# Patient Record
Sex: Male | Born: 1945
Health system: Southern US, Community
[De-identification: ages and names within clinical notes are randomized; demographics above are authoritative.]

## PROBLEM LIST (undated history)

## (undated) DIAGNOSIS — K5750 Diverticulosis of both small and large intestine without perforation or abscess without bleeding: Secondary | ICD-10-CM

## (undated) DIAGNOSIS — M545 Low back pain, unspecified: Secondary | ICD-10-CM

## (undated) DIAGNOSIS — Z9289 Personal history of other medical treatment: Secondary | ICD-10-CM

## (undated) DIAGNOSIS — T415X1A Poisoning by therapeutic gases, accidental (unintentional), initial encounter: Secondary | ICD-10-CM

## (undated) DIAGNOSIS — K469 Unspecified abdominal hernia without obstruction or gangrene: Secondary | ICD-10-CM

## (undated) DIAGNOSIS — I2510 Atherosclerotic heart disease of native coronary artery without angina pectoris: Secondary | ICD-10-CM

## (undated) DIAGNOSIS — H33319 Horseshoe tear of retina without detachment, unspecified eye: Secondary | ICD-10-CM

## (undated) DIAGNOSIS — E785 Hyperlipidemia, unspecified: Secondary | ICD-10-CM

## (undated) DIAGNOSIS — T300 Burn of unspecified body region, unspecified degree: Secondary | ICD-10-CM

## (undated) DIAGNOSIS — K219 Gastro-esophageal reflux disease without esophagitis: Secondary | ICD-10-CM

## (undated) DIAGNOSIS — B029 Zoster without complications: Secondary | ICD-10-CM

## (undated) DIAGNOSIS — M722 Plantar fascial fibromatosis: Secondary | ICD-10-CM

## (undated) DIAGNOSIS — M75101 Unspecified rotator cuff tear or rupture of right shoulder, not specified as traumatic: Secondary | ICD-10-CM

## (undated) HISTORY — DX: Burn of unspecified body region, unspecified degree: T30.0

## (undated) HISTORY — DX: Horseshoe tear of retina without detachment, unspecified eye: H33.319

## (undated) HISTORY — PX: EYE SURGERY: SHX253

## (undated) HISTORY — DX: Diverticulosis of both small and large intestine without perforation or abscess without bleeding: K57.50

## (undated) HISTORY — PX: SHOULDER SURGERY: SHX246

## (undated) HISTORY — DX: Low back pain: M54.5

## (undated) HISTORY — DX: Poisoning by therapeutic gases, accidental (unintentional), initial encounter: T41.5X1A

## (undated) HISTORY — PX: REFRACTIVE SURGERY: SHX103

## (undated) HISTORY — PX: SHOULDER ARTHROSCOPY: SHX128

## (undated) HISTORY — DX: Unspecified abdominal hernia without obstruction or gangrene: K46.9

## (undated) HISTORY — DX: Hyperlipidemia, unspecified: E78.5

## (undated) HISTORY — DX: Unspecified rotator cuff tear or rupture of right shoulder, not specified as traumatic: M75.101

## (undated) HISTORY — DX: Plantar fascial fibromatosis: M72.2

## (undated) HISTORY — DX: Low back pain, unspecified: M54.50

## (undated) HISTORY — PX: TONSILLECTOMY: SUR1361

## (undated) HISTORY — DX: Atherosclerotic heart disease of native coronary artery without angina pectoris: I25.10

## (undated) HISTORY — DX: Personal history of other medical treatment: Z92.89

## (undated) HISTORY — DX: Zoster without complications: B02.9

## (undated) HISTORY — DX: Gastro-esophageal reflux disease without esophagitis: K21.9

---

## 1998-04-27 ENCOUNTER — Ambulatory Visit: Admission: RE | Admit: 1998-04-27 | Discharge: 1998-04-27 | Payer: Self-pay | Admitting: Internal Medicine

## 2007-08-27 ENCOUNTER — Ambulatory Visit (HOSPITAL_COMMUNITY): Admission: RE | Admit: 2007-08-27 | Discharge: 2007-08-27 | Payer: Self-pay | Admitting: Internal Medicine

## 2011-04-24 ENCOUNTER — Ambulatory Visit: Payer: Self-pay | Admitting: Cardiovascular Disease

## 2011-05-25 ENCOUNTER — Encounter: Payer: Self-pay | Admitting: Cardiology

## 2011-05-28 ENCOUNTER — Encounter: Payer: Self-pay | Admitting: *Deleted

## 2011-05-28 ENCOUNTER — Ambulatory Visit (INDEPENDENT_AMBULATORY_CARE_PROVIDER_SITE_OTHER): Payer: Commercial Managed Care - PPO | Admitting: Cardiology

## 2011-05-28 VITALS — BP 158/87 | HR 76 | Resp 12 | Ht 66.0 in | Wt 173.0 lb

## 2011-05-28 DIAGNOSIS — E7800 Pure hypercholesterolemia, unspecified: Secondary | ICD-10-CM

## 2011-05-28 DIAGNOSIS — I2510 Atherosclerotic heart disease of native coronary artery without angina pectoris: Secondary | ICD-10-CM | POA: Insufficient documentation

## 2011-05-28 DIAGNOSIS — E785 Hyperlipidemia, unspecified: Secondary | ICD-10-CM | POA: Insufficient documentation

## 2011-05-28 DIAGNOSIS — I10 Essential (primary) hypertension: Secondary | ICD-10-CM | POA: Insufficient documentation

## 2011-05-28 NOTE — Assessment & Plan Note (Signed)
BP high today, has been in normal range at home.  States BP is always high at doctor's office.  May be "white coat" component.  I will have him check his BP daily and bring readings with him when he returns for ETT.  He will also bring in his cuff and calibrate it with ours.  As above, I will likely start him on a low dose of ramipril.

## 2011-05-28 NOTE — Assessment & Plan Note (Signed)
I would aim for excellent LDL control with known coronary disease.  Last LDL was 88 in 1/12.  Goal LDL < 70.  Will get fasting lipids when he returns for ETT.

## 2011-05-28 NOTE — Patient Instructions (Signed)
Your physician has requested that you have an exercise tolerance test. For further information please visit [email or web address] Please also follow instruction sheet, as given.  With DR. Shirlee Latch  Your physician recommends that you return for lab work same day as treadmill for fasting lipid and liver.  Your physician has requested that you regularly monitor and record your blood pressure readings at home. Please use the same machine at the same time of day to check your readings and record them to bring to your follow-up visit.  Please bring your bp log and cuff with you to your treadmill appt.

## 2011-05-28 NOTE — Assessment & Plan Note (Signed)
Patient has definite coronary artery disease based on coronary calcification seen on CT chest at [facility].  He really has no cardiopulmonary symptoms that could be construed as anginal.  Typically, this presentation would warrant maximal medical management with ASA 81, statin, and probably addition of ACEI without further imaging evaluation.  He does have rather diffuse calcification in the left main and all three major vessels, however.  I will set him up for an ETT without further imaging.  If he has good exercise tolerance and no or only mild ST changes, medical treatment will be warranted.  The only indication for further evaluation would be high-risk changes on ECG (deep ST depression in multiple leads), BP falling with exercise, severe chest pain on treadmill, etc.   - Continue ASA 81 and statin. - Will discuss addition of ramipril when I see him back for treadmill stress.

## 2011-05-28 NOTE — Progress Notes (Signed)
PCP: Dr. Waynard Edwards  65 yo with history of hyperlipidemia was referred for evaluation of coronary calcification seen on chest CT.  Patient had a chest CT done in 6/12 to screen for lung cancer with history of prior smoking.  This was done at [facility].  The CT showed calcified plaque in the left main and in all three coronary vessels.  Patient quit smoking in 1997.    Reginald Davis is very active in general.  He works out 5-6 times a week on a treadmill for 25-30 minutes at a time.  He gets his heart rate up to the 130s with peak exercise.  He does not get chest pain or heaviness with exertion.  He will note some mild dyspnea if he runs up a flight of steps but no shortness of breath after climbing a couple of flights at normal pace.  No recent change in exercise tolerance.  No palpitations. He does get occasional pain in his left shoulder area that is not related to exertion.  He has had left rotator cuff problems and had recent rotator cuff surgery.  He has been trying to eat more healthily and exercise more the last two years.  He has lost over 20 lbs.   Blood pressure is elevated in the office.  There may be a "white coat" component to this.  He takes his BP at least weekly at home and it runs 110s to 120s for the most part systolic.   ECG: NSR, normal  Labs (1/12): K 4.5, creatinine 0.8, LDL 88, HDL 53  PMH: 1. Hyperlipidemia 2. H/o Varicella zoster 3. Degenerative disc disease with history of low back and c-spine area pain 4. GERD 5. Diverticulosis 6. Impaired fasting glucose 7. BPH 8. CAD: Coronary calcification noted on CT chest from 6/12 (to screen for lung cancer in former smoker).  9. Left rotator cuff surgery  SH: Former smoker (quit 1997).  Married, 2 children.  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  Mostly retired now.  Occasional ETOH.   FH: Father with CAD found in his early 43s, had CABG in his late 6s.  Uncle with PCI.  One brother, no known CAD.   ROS: All  systems reviewed and negative except as per HPI.   Current Outpatient Prescriptions  Medication Sig Dispense Refill  . ALPHA-LIPOIC ACID PO Take 1 tablet by mouth daily.        Marland Kitchen aspirin 81 MG tablet Take 81 mg by mouth daily.        Marland Kitchen atorvastatin (LIPITOR) 20 MG tablet Take 1 tablet by mouth Daily.      . calcium gluconate 500 MG tablet Take 500 mg by mouth daily.        . Coenzyme Q10 (CO Q 10 PO) Take 1 tablet by mouth daily.        . Glucosamine-Chondroitin (COSAMIN DS PO) 2 tabs po qd       . Multiple Vitamins-Minerals (CENTRUM SILVER PO) Take 1 tablet by mouth daily.        . NON FORMULARY VIT D 800IU DAILY       . Omega-3 Fatty Acids (FISH OIL CONCENTRATE PO) 4800MG  DAILY       . SELENIUM PO Take 1 tablet by mouth daily.        . vitamin C (ASCORBIC ACID) 500 MG tablet Take 500 mg by mouth daily.        Marland Kitchen Zn-Pyg Afri-Nettle-Saw Palmet (SAW PALMETTO COMPLEX PO)  Take 1 tablet by mouth daily.          BP 158/87  Pulse 76  Resp 12  Ht 5\' 6"  (1.676 m)  Wt 173 lb (78.472 kg)  BMI 27.92 kg/m2 General: NAD Neck: No JVD, no thyromegaly or thyroid nodule.  Lungs: Clear to auscultation bilaterally with normal respiratory effort. CV: Nondisplaced PMI.  Heart regular S1/S2, soft S4, no murmur.  No peripheral edema.  No carotid bruit.  Normal pedal pulses.  Abdomen: Soft, nontender, no hepatosplenomegaly, no distention.  Skin: Intact without lesions or rashes.  Neurologic: Alert and oriented x 3.  Psych: Normal affect. Extremities: No clubbing or cyanosis.  HEENT: Normal.

## 2011-06-14 ENCOUNTER — Encounter: Payer: Self-pay | Admitting: Cardiology

## 2011-07-09 ENCOUNTER — Other Ambulatory Visit (INDEPENDENT_AMBULATORY_CARE_PROVIDER_SITE_OTHER): Payer: Commercial Managed Care - PPO

## 2011-07-09 DIAGNOSIS — E7800 Pure hypercholesterolemia, unspecified: Secondary | ICD-10-CM

## 2011-07-09 LAB — HEPATIC FUNCTION PANEL
ALT: 35 U/L (ref 0–53)
AST: 32 U/L (ref 0–37)
Albumin: 4 g/dL (ref 3.5–5.2)
Total Bilirubin: 0.5 mg/dL (ref 0.3–1.2)
Total Protein: 6.9 g/dL (ref 6.0–8.3)

## 2011-07-09 LAB — LIPID PANEL
Cholesterol: 139 mg/dL (ref 0–200)
HDL: 49.3 mg/dL (ref >39.00)
Triglycerides: 65 mg/dL (ref 0.0–149.0)

## 2011-07-11 ENCOUNTER — Ambulatory Visit (INDEPENDENT_AMBULATORY_CARE_PROVIDER_SITE_OTHER): Payer: Commercial Managed Care - PPO | Admitting: Cardiology

## 2011-07-11 DIAGNOSIS — I2510 Atherosclerotic heart disease of native coronary artery without angina pectoris: Secondary | ICD-10-CM

## 2011-07-11 MED ORDER — RAMIPRIL 2.5 MG PO CAPS: 2.5000 mg | ORAL_CAPSULE | Freq: Every day | ORAL | Status: DC

## 2011-07-11 NOTE — Progress Notes (Signed)
Addended by: Jacqlyn Krauss on: 07/11/2011 10:34 AM   Modules accepted: Orders

## 2011-07-11 NOTE — Patient Instructions (Signed)
Start ramipril 2.5mg  daily.  Your physician recommends that you return for lab work in: 2 weeks --BMET   Your physician wants you to follow-up in: 1 year with Dr Shirlee Latch. (October 2013).You will receive a reminder letter in the mail two months in advance. If you don't receive a letter, please call our office to schedule the follow-up appointment.

## 2011-07-11 NOTE — Progress Notes (Signed)
Exercise Treadmill Test  Pre-Exercise Testing Evaluation Rhythm: normal sinus  Rate: 76   PR:  .16 QRS:  .09  QT:  .37 QTc: .42     Test  Exercise Tolerance Test Ordering MD: Marca Ancona, MD  Interpreting MD:  Marca Ancona, MD  Unique Test No: 1  Treadmill:  1  Indication for ETT: known ASHD  Contraindication to ETT: No   Stress Modality: exercise - treadmill  Cardiac Imaging Performed: non   Protocol: standard Bruce - maximal  Max BP:  212/76  Max MPHR (bpm):  155 85% MPR (bpm):  131  MPHR obtained (bpm):  148 % MPHR obtained:  95%  Reached 85% MPHR (min:sec):  7:30 Total Exercise Time (min-sec):  9:00  Workload in METS:  10.1 Borg Scale: 15  Reason ETT Terminated:  Fatigue, no chest pain.    ST Segment Analysis At Rest: normal ST segments - no evidence of significant ST depression With Exercise: no evidence of significant ST depression.  Patient did develop an exercise-related LBBB at heart rate > 130 that resolved when HR came down below 130.   Other Information Arrhythmia:  No Angina during ETT:  absent (0) Quality of ETT:  diagnostic  ETT Interpretation:  normal - no evidence of ischemia by ST analysis.  Patient developed a rate-related LBBB that resolved when heart rate came back down. This is nonspecific.   Recommendations: Would suggest initiation of ramipril at a low dose (2.5 mg daily) and followup in 1 year.   Dalton [employer]

## 2011-07-23 ENCOUNTER — Other Ambulatory Visit (INDEPENDENT_AMBULATORY_CARE_PROVIDER_SITE_OTHER): Payer: Commercial Managed Care - PPO | Admitting: *Deleted

## 2011-07-23 DIAGNOSIS — I2510 Atherosclerotic heart disease of native coronary artery without angina pectoris: Secondary | ICD-10-CM

## 2011-07-24 LAB — BASIC METABOLIC PANEL
BUN: 21 mg/dL (ref 6–23)
CO2: 26 mEq/L (ref 19–32)
Calcium: 9.2 mg/dL (ref 8.4–10.5)
Creatinine, Ser: 0.8 mg/dL (ref 0.4–1.5)

## 2012-01-10 ENCOUNTER — Encounter: Payer: Self-pay | Admitting: Cardiology

## 2012-02-16 DIAGNOSIS — H35109 Retinopathy of prematurity, unspecified, unspecified eye: Secondary | ICD-10-CM | POA: Insufficient documentation

## 2012-05-26 ENCOUNTER — Ambulatory Visit (INDEPENDENT_AMBULATORY_CARE_PROVIDER_SITE_OTHER): Payer: Commercial Managed Care - PPO | Admitting: Cardiology

## 2012-05-26 ENCOUNTER — Encounter: Payer: Self-pay | Admitting: Cardiology

## 2012-05-26 VITALS — BP 138/86 | HR 63 | Ht 66.0 in | Wt 179.0 lb

## 2012-05-26 DIAGNOSIS — I10 Essential (primary) hypertension: Secondary | ICD-10-CM

## 2012-05-26 DIAGNOSIS — E785 Hyperlipidemia, unspecified: Secondary | ICD-10-CM

## 2012-05-26 DIAGNOSIS — I2510 Atherosclerotic heart disease of native coronary artery without angina pectoris: Secondary | ICD-10-CM

## 2012-05-26 NOTE — Progress Notes (Signed)
Patient ID: Reginald Davis, male   DOB: Dec 25, 1945, 66 y.o.   MRN: [id number] PCP: Dr. Waynard Edwards  66 yo with history of hyperlipidemia was initiallyreferred for evaluation of coronary calcification seen on chest CT.  Patient had a chest CT done in 6/12 to screen for lung cancer with history of prior smoking.  This was done at [facility].  The CT showed calcified plaque in the left main and in all three coronary vessels.  Patient quit smoking in 1997.    [name] is very active in general.  He works out 5-6 times a week on a treadmill for 25-30 minutes at a time.   He does not get chest pain or heaviness with exertion.  He will note some mild dyspnea if he runs up a flight of steps but no shortness of breath after climbing a couple of flights at normal pace.  No recent change in exercise tolerance.  No palpitations.  BP has been well-controlled and runs 110s-120s systolic at home.  Last lipid profile looked good but he states that he had a [facility] panel done that was higher risk, so atorvastatin was increased and he was started on Zetia.  He had muscle cramps with Zetia so this was stopped.  He tolerates atorvastatin without problems.   ETT was done last year, showing no ischemic changes.  He developed a transient LBBB in recovery.   ECG: NSR, normal  Labs (1/12): K 4.5, creatinine 0.8, LDL 88, HDL 53 Labs (10/12): LDL 77, HDL 49 Labs (5/13): LDL 76, HDL 45, K 4.3, creatinine 0.8  PMH: 1. Hyperlipidemia 2. H/o Varicella zoster 3. Degenerative disc disease with history of low back and c-spine area pain 4. GERD 5. Diverticulosis 6. Impaired fasting glucose 7. BPH 8. CAD: Coronary calcification noted on CT chest from 6/12 (to screen for lung cancer in former smoker).  ETT (10/12) with no ischemic changes.  Transient LBBB during recovery.  9. Left rotator cuff surgery  SH: Former smoker (quit 1997).  Married, 2 children.  Started [employer] in [place], later sold it.  Mostly retired  now.  Occasional ETOH.   FH: Father with CAD found in his early 23s, had CABG in his late 88s.  Uncle with PCI.  One brother, no known CAD.    Current Outpatient Prescriptions  Medication Sig Dispense Refill  . aspirin 81 MG tablet Take 81 mg by mouth daily.        . Glucosamine-Chondroitin (COSAMIN DS PO) 2 tabs po qd       . Multiple Vitamins-Minerals (CENTRUM SILVER PO) Take 1 tablet by mouth daily.        . Omega-3 Fatty Acids (FISH OIL CONCENTRATE PO) 4800MG  DAILY       . ramipril (ALTACE) 2.5 MG capsule Take 1 capsule (2.5 mg total) by mouth daily.  30 capsule  11  . SELENIUM PO Take 1 tablet by mouth daily.        . vitamin C (ASCORBIC ACID) 500 MG tablet Take 500 mg by mouth daily.        Marland Kitchen Zn-Pyg Afri-Nettle-Saw Palmet (SAW PALMETTO COMPLEX PO) Take 1 tablet by mouth daily.        Marland Kitchen DISCONTD: ALPHA-LIPOIC ACID PO Take 1 tablet by mouth daily.        Marland Kitchen DISCONTD: atorvastatin (LIPITOR) 20 MG tablet Take 40 mg by mouth Daily.       Marland Kitchen DISCONTD: Coenzyme Q10 (CO Q 10 PO) Take  1 tablet by mouth daily.        . Alpha-Lipoic Acid 200 MG CAPS Two daily    0  . atorvastatin (LIPITOR) 40 MG tablet Take 1 tablet (40 mg total) by mouth daily.      . Coenzyme Q10 300 MG CAPS Take by mouth.    0    BP 138/86  Pulse 63  Ht 5\' 6"  (1.676 m)  Wt 179 lb (81.194 kg)  BMI 28.89 kg/m2 General: NAD Neck: No JVD, no thyromegaly or thyroid nodule.  Lungs: Clear to auscultation bilaterally with normal respiratory effort. CV: Nondisplaced PMI.  Heart regular S1/S2, soft S4, no murmur.  No peripheral edema.  No carotid bruit.  Normal pedal pulses.  Abdomen: Soft, nontender, no hepatosplenomegaly, no distention.  Neurologic: Alert and oriented x 3.  Psych: Normal affect. Extremities: No clubbing or cyanosis.   Assessment/Plan:  1. Coronary artery disease  Patient has definite coronary artery disease based on coronary calcification seen on CT chest at [facility]. He really has no cardiopulmonary  symptoms that could be construed as anginal. ETT last year showed no ischemic changes.  - Continue ASA 81, ACEI, and statin.  2. Hyperlipidemia I would aim for excellent LDL control with known coronary disease. Goal LDL < 70.  Since last lipid profile, atorvastatin was increased to 40 mg daily.  He did not tolerate Zetia.  I will get lipids/LFTs given recent change in meds.  I do not think that he needs to try to go back on Zetia, especially if LDL is lowered to < 70.  3. Elevated blood pressure BP is doing well at home.  He has tolerated ramipril with no problems.   I will see him back in a year.   Marca Ancona 05/26/2012 3:00 PM

## 2012-05-26 NOTE — Patient Instructions (Addendum)
You  have  a FASTING lipid profile /liver profile scheduled for Tuesday October 1,2013. Do not eat or drink after midnight Monday night.  The lab opens at 8:30am.   Your physician wants you to follow-up in: 1 year with Dr Shirlee Latch. (September 2014). You will receive a reminder letter in the mail two months in advance. If you don't receive a letter, please call our office to schedule the follow-up appointment.

## 2012-06-10 ENCOUNTER — Other Ambulatory Visit (INDEPENDENT_AMBULATORY_CARE_PROVIDER_SITE_OTHER): Payer: Commercial Managed Care - PPO

## 2012-06-10 DIAGNOSIS — I2510 Atherosclerotic heart disease of native coronary artery without angina pectoris: Secondary | ICD-10-CM

## 2012-06-10 LAB — HEPATIC FUNCTION PANEL
ALT: 44 U/L (ref 0–53)
Albumin: 4 g/dL (ref 3.5–5.2)
Total Bilirubin: 0.5 mg/dL (ref 0.3–1.2)
Total Protein: 6.9 g/dL (ref 6.0–8.3)

## 2012-06-10 LAB — LIPID PANEL
HDL: 45.1 mg/dL (ref >39.00)
Triglycerides: 88 mg/dL (ref 0.0–149.0)
VLDL: 17.6 mg/dL (ref 0.0–40.0)

## 2012-06-11 ENCOUNTER — Telehealth: Payer: Self-pay | Admitting: Cardiology

## 2012-06-11 NOTE — Telephone Encounter (Signed)
Spoke with pt about recent lab results 

## 2012-06-11 NOTE — Telephone Encounter (Signed)
Spoke with pt about recent labs.

## 2012-06-11 NOTE — Telephone Encounter (Signed)
Pt has futher questions re lab work , pls call

## 2012-06-11 NOTE — Telephone Encounter (Signed)
Pt rtn your call

## 2012-07-09 ENCOUNTER — Other Ambulatory Visit: Payer: Self-pay | Admitting: Cardiology

## 2012-08-20 DIAGNOSIS — H269 Unspecified cataract: Secondary | ICD-10-CM | POA: Insufficient documentation

## 2012-09-19 DIAGNOSIS — Z9849 Cataract extraction status, unspecified eye: Secondary | ICD-10-CM | POA: Insufficient documentation

## 2013-01-06 ENCOUNTER — Other Ambulatory Visit: Payer: Self-pay | Admitting: Cardiology

## 2013-03-17 DIAGNOSIS — H33319 Horseshoe tear of retina without detachment, unspecified eye: Secondary | ICD-10-CM | POA: Insufficient documentation

## 2013-03-17 DIAGNOSIS — H35372 Puckering of macula, left eye: Secondary | ICD-10-CM | POA: Insufficient documentation

## 2013-03-17 DIAGNOSIS — H35379 Puckering of macula, unspecified eye: Secondary | ICD-10-CM | POA: Insufficient documentation

## 2013-04-15 ENCOUNTER — Telehealth: Payer: Self-pay | Admitting: Cardiology

## 2013-04-15 NOTE — Telephone Encounter (Signed)
Walk in pt Form " Envelope Left at [facility] Desk" gave to Thurston Hole L  04/15/13/KM

## 2013-04-27 ENCOUNTER — Telehealth: Payer: Self-pay | Admitting: *Deleted

## 2013-04-27 NOTE — Telephone Encounter (Signed)
Dr Shirlee Latch reviewed letter from patient. Per Dr Almon Hercules advised to stop ramipril and bring BP readings to appt with Dr Shirlee Latch in September 2014.

## 2013-05-22 ENCOUNTER — Encounter: Payer: Self-pay | Admitting: Cardiology

## 2013-05-22 ENCOUNTER — Ambulatory Visit (INDEPENDENT_AMBULATORY_CARE_PROVIDER_SITE_OTHER): Payer: Commercial Managed Care - PPO | Admitting: Cardiology

## 2013-05-22 VITALS — BP 121/79 | HR 68 | Ht 66.0 in | Wt 160.0 lb

## 2013-05-22 DIAGNOSIS — I10 Essential (primary) hypertension: Secondary | ICD-10-CM

## 2013-05-22 DIAGNOSIS — I2510 Atherosclerotic heart disease of native coronary artery without angina pectoris: Secondary | ICD-10-CM

## 2013-05-22 DIAGNOSIS — E785 Hyperlipidemia, unspecified: Secondary | ICD-10-CM

## 2013-05-22 MED ORDER — RAMIPRIL 1.25 MG PO CAPS: 1.2500 mg | ORAL_CAPSULE | Freq: Every day | ORAL | Status: DC

## 2013-05-22 NOTE — Patient Instructions (Addendum)
Decrease ramipril to 1.25mg  daily.   Your physician recommends that you return for a FASTING lipid profile.  Your physician wants you to follow-up in: 1 year with Dr Shirlee Latch. (September 2015). You will receive a reminder letter in the mail two months in advance. If you don't receive a letter, please call our office to schedule the follow-up appointment.

## 2013-05-24 NOTE — Progress Notes (Signed)
Patient ID: Reginald Davis, male   DOB: 28-Apr-1946, 67 y.o.   MRN: [id number] PCP: Dr. Waynard Edwards  67 yo with history of hyperlipidemia was initially referred for evaluation of coronary calcification seen on chest CT.  Patient had a chest CT done in 6/12 to screen for lung cancer with history of prior smoking.  This was done at [facility].  The CT showed calcified plaque in the left main and in all three coronary vessels.  Patient quit smoking in 1997.    Reginald Davis is very active in general.  He works out 5-6 times a week on a treadmill for 25-30 minutes at a time.   He does not get chest pain or heaviness with exertion.  He had some mild chest burning at rest about 6 wks ago that resolved with omeprazole use.  He will note some mild dyspnea if he runs up a flight of steps but no shortness of breath after climbing a couple of flights at normal pace.  No recent change in exercise tolerance.  No palpitations.  BP has been well-controlled and is actually running in the 100s systolic.  Weight is down > 20 lbs since last appointment due to dietary changes.   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  Atorvastatin has been linked to this disease, so he stopped atorvasatin.  The pityriasis rash resolved.  He restarted atorvastatin at lower dose (20 mg daily) with no recurrence.   ECG: NSR, PVC, otherwise normal  Labs (1/12): K 4.5, creatinine 0.8, LDL 88, HDL 53 Labs (10/12): LDL 77, HDL 49 Labs (5/13): LDL 76, HDL 45, K 4.3, creatinine 0.8 Labs (10/13): LDL 80, HDL 45  PMH: 1. Hyperlipidemia 2. H/o Varicella zoster 3. Degenerative disc disease with history of low back and c-spine area pain 4. GERD 5. Diverticulosis 6. Impaired fasting glucose 7. BPH 8. CAD: Coronary calcification noted on CT chest from 6/12 (to screen for lung cancer in former smoker).  ETT (10/12) with no ischemic changes.  Transient LBBB during recovery.  9. Left rotator cuff surgery 10. Pityriasis  lichenoida chronica  SH: Former smoker (quit 1997).  Married, 2 children.  Started [employer] in [place], later sold it.  Mostly retired now.  Occasional ETOH.   FH: Father with CAD found in his early 94s, had CABG in his late 33s.  Uncle with PCI.  One brother, no known CAD.   ROS: All systems reviewed and negative except as per HPI.    Current Outpatient Prescriptions  Medication Sig Dispense Refill  . Alpha-Lipoic Acid 200 MG CAPS Two daily    0  . aspirin 81 MG tablet Take 81 mg by mouth daily.        Reginald Davis atorvastatin (LIPITOR) 20 MG tablet Take 20 mg by mouth daily.      . Coenzyme Q10 300 MG CAPS Take by mouth.    0  . fluocinonide (LIDEX) 0.05 % external solution       . fluticasone (FLONASE) 50 MCG/ACT nasal spray       . guaiFENesin (MUCINEX) 600 MG 12 hr tablet Take 1,200 mg by mouth 2 (two) times daily.      . Loratadine (CLARITIN) 10 MG CAPS Take by mouth.      . Multiple Vitamins-Minerals (CENTRUM SILVER PO) Take 1 tablet by mouth daily.        . Omega-3 Fatty Acids (FISH OIL CONCENTRATE PO) 4800MG  DAILY       .  Salicylic Acid 6 % SHAM       . SELENIUM PO Take 1 tablet by mouth daily.        . vitamin C (ASCORBIC ACID) 500 MG tablet Take 500 mg by mouth daily.        Reginald Davis Zn-Pyg Afri-Nettle-Saw Palmet (SAW PALMETTO COMPLEX PO) Take 1 tablet by mouth daily.        . ramipril (ALTACE) 1.25 MG capsule Take 1 capsule (1.25 mg total) by mouth daily.  30 capsule  11   No current facility-administered medications for this visit.    BP 121/79  Pulse 68  Ht 5\' 6"  (1.676 m)  Wt 72.576 kg (160 lb)  BMI 25.84 kg/m2 General: NAD Neck: No JVD, no thyromegaly or thyroid nodule.  Lungs: Clear to auscultation bilaterally with normal respiratory effort. CV: Nondisplaced PMI.  Heart regular S1/S2, soft S4, no murmur.  No peripheral edema.  No carotid bruit.  Normal pedal pulses.  Abdomen: Soft, nontender, no hepatosplenomegaly, no distention.  Neurologic: Alert and oriented  x 3.  Psych: Normal affect. Extremities: No clubbing or cyanosis.   Assessment/Plan:  1. Coronary artery disease  Patient has definite coronary artery disease based on coronary calcification seen on CT chest at [facility]. He really has no cardiopulmonary symptoms that could be construed as anginal. ETT a couple of years ago showed no ischemic changes.  - Continue ASA 81 and statin. - As blood pressure is running on the low side, I will decrease ramipril to 1.25 mg daily.  2. Hyperlipidemia I would aim for excellent LDL control with known coronary disease. Goal LDL < 70.  Check fasting lipids today.  Atorvastatin was decreased due to pityriasis lichenoida chronic.   I will see him back in a year.   Reginald Davis 05/24/2013 10:46 PM

## 2013-05-28 ENCOUNTER — Other Ambulatory Visit (INDEPENDENT_AMBULATORY_CARE_PROVIDER_SITE_OTHER): Payer: Commercial Managed Care - PPO

## 2013-05-28 DIAGNOSIS — I10 Essential (primary) hypertension: Secondary | ICD-10-CM

## 2013-05-28 DIAGNOSIS — I2510 Atherosclerotic heart disease of native coronary artery without angina pectoris: Secondary | ICD-10-CM

## 2013-05-28 DIAGNOSIS — E785 Hyperlipidemia, unspecified: Secondary | ICD-10-CM

## 2013-05-28 LAB — LIPID PANEL: VLDL: 12.2 mg/dL (ref 0.0–40.0)

## 2013-06-02 ENCOUNTER — Telehealth: Payer: Self-pay | Admitting: Cardiology

## 2013-06-02 NOTE — Telephone Encounter (Signed)
Follow Up:  Pt states he is returning Anne's call. Pt would liked to be called back,

## 2013-06-02 NOTE — Telephone Encounter (Signed)
Spoke with patient about recent lab results 

## 2014-05-05 ENCOUNTER — Other Ambulatory Visit: Payer: Self-pay | Admitting: Internal Medicine

## 2014-05-05 DIAGNOSIS — Z139 Encounter for screening, unspecified: Secondary | ICD-10-CM

## 2014-05-19 ENCOUNTER — Other Ambulatory Visit: Payer: Self-pay | Admitting: Cardiology

## 2014-05-20 ENCOUNTER — Ambulatory Visit
Admission: RE | Admit: 2014-05-20 | Discharge: 2014-05-20 | Disposition: A | Discharge status: Home / Self Care | Payer: No Typology Code available for payment source | Source: Ambulatory Visit | Attending: Internal Medicine | Admitting: Internal Medicine

## 2014-05-20 ENCOUNTER — Encounter (INDEPENDENT_AMBULATORY_CARE_PROVIDER_SITE_OTHER): Payer: Self-pay

## 2014-05-20 DIAGNOSIS — Z139 Encounter for screening, unspecified: Secondary | ICD-10-CM

## 2014-06-07 ENCOUNTER — Encounter: Payer: Self-pay | Admitting: *Deleted

## 2014-06-07 ENCOUNTER — Other Ambulatory Visit: Payer: Self-pay | Admitting: *Deleted

## 2014-06-07 DIAGNOSIS — H269 Unspecified cataract: Secondary | ICD-10-CM | POA: Insufficient documentation

## 2014-06-08 ENCOUNTER — Encounter: Payer: Self-pay | Admitting: Cardiology

## 2014-06-08 ENCOUNTER — Ambulatory Visit (INDEPENDENT_AMBULATORY_CARE_PROVIDER_SITE_OTHER): Payer: Commercial Managed Care - PPO | Admitting: Cardiology

## 2014-06-08 VITALS — BP 122/74 | HR 75 | Ht 66.0 in | Wt 168.0 lb

## 2014-06-08 DIAGNOSIS — I25810 Atherosclerosis of coronary artery bypass graft(s) without angina pectoris: Secondary | ICD-10-CM

## 2014-06-08 NOTE — Patient Instructions (Signed)
Your physician wants you to follow-up in: 1 year with Dr McLean. (September 2016).  You will receive a reminder letter in the mail two months in advance. If you don't receive a letter, please call our office to schedule the follow-up appointment.  

## 2014-06-09 NOTE — Progress Notes (Signed)
Patient ID: Reginald Davis, male   DOB: [birth year], 68 y.o.   MRN: [id number] PCP: Dr. Joylene Davis  68 yo with history of hyperlipidemia was initially referred for evaluation of coronary calcification seen on chest CT.  Patient had a chest CT done in 6/12 to screen for lung cancer with history of prior smoking.  This was done at [facility].  The CT showed calcified plaque in the left main and in all three coronary vessels.  Patient quit smoking in 1997.    Reginald Davis is very active in general.  He works out 5-6 times a week on a treadmill for 20 minutes at a time with 10 minutes on a recumbent bicycle.   He does not get chest pain or heaviness with exertion.  He will note some mild dyspnea if he runs up a flight of steps but no shortness of breath after climbing a couple of flights at normal pace.  No recent change in exercise tolerance though his ability to exercise has been limited by a Morton's neuroma on his foot.  No palpitations.  BP has been well-controlled. He is able to tolerate atorvastatin 10 mg daily without leg cramps.   Patient has been having hemorrhoidal bleeding and has been undergoing staged hemorrhoidal banding.  He was told to stop ASA while this was going on.  ECG: NSR, normal  Labs (1/12): K 4.5, creatinine 0.8, LDL 88, HDL 53 Labs (10/12): LDL 77, HDL 49 Labs (5/13): LDL 76, HDL 45, K 4.3, creatinine 0.8 Labs (10/13): LDL 80, HDL 45 Labs (7/15): LDL 80, HDL 48, TSH normal, K 5.1, creatinine 0.8  PMH: 1. Hyperlipidemia 2. H/o Varicella zoster 3. Degenerative disc disease with history of low back and c-spine area pain 4. GERD 5. Diverticulosis 6. Impaired fasting glucose 7. BPH 8. CAD: Coronary calcification noted on CT chest from 6/12 (to screen for lung cancer in former smoker).  ETT (10/12) with no ischemic changes.  Transient LBBB during recovery.  9. 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 Pulmonary nodules: CT chest (9/15) showed coronary  calcification along with a 3 mm LUL nodule.  SH: Former smoker (quit 1997).  Married, 2 children.  Started [employer] in [place], later sold it.  Mostly retired now.  Occasional ETOH.   FH: Father with CAD found in his early 68s, had CABG in his late 68s.  Uncle with PCI.  One brother, no known CAD.   ROS: All systems reviewed and negative except as per HPI.    Current Outpatient Prescriptions  Medication Sig Dispense Refill  . Alpha-Lipoic Acid 200 MG CAPS Two daily    0  . aspirin 81 MG tablet Take 81 mg by mouth daily.        Marland Kitchen atorvastatin (LIPITOR) 20 MG tablet Take 20 mg by mouth daily.      . calcium-vitamin D (CALCIUM 500/D) 500-200 MG-UNIT per tablet Take 1 tablet by mouth daily.      . Coenzyme Q10 300 MG CAPS Take by mouth.    0  . fluocinonide (LIDEX) 0.05 % external solution Apply 1 application topically as needed.       . fluticasone (FLONASE) 50 MCG/ACT nasal spray       . guaiFENesin (MUCINEX) 600 MG 12 hr tablet Take 1,200 mg by mouth 2 (two) times daily.      . Loratadine (CLARITIN) 10 MG CAPS Take by mouth.      . loteprednol (LOTEMAX) 0.2 %  SUSP Place 1 drop into both eyes 2 (two) times daily.      . Multiple Vitamins-Minerals (CENTRUM SILVER PO) Take 1 tablet by mouth daily.        . Omega-3 Fatty Acids (FISH OIL CONCENTRATE PO) 4800MG  DAILY       . ramipril (ALTACE) 1.25 MG capsule TAKE 1 CAPSULE BY MOUTH ONCE DAILY  30 capsule  0  . Salicylic Acid 6 % SHAM       . vitamin C (ASCORBIC ACID) 500 MG tablet Take 500 mg by mouth daily.        Marland Kitchen Zn-Pyg Afri-Nettle-Saw Palmet (SAW PALMETTO COMPLEX PO) Take 1 tablet by mouth daily.         No current facility-administered medications for this visit.    BP 122/74  Pulse 75  Ht 5\' 6"  (1.676 m)  Wt 168 lb (76.204 kg)  BMI 27.13 kg/m2 General: NAD Neck: No JVD, no thyromegaly or thyroid nodule.  Lungs: Clear to auscultation bilaterally with normal respiratory effort. CV: Nondisplaced PMI.  Heart regular  S1/S2, soft S4, no murmur.  No peripheral edema.  No carotid bruit.  Normal pedal pulses.  Abdomen: Soft, nontender, no hepatosplenomegaly, no distention.  Neurologic: Alert and oriented x 3.  Psych: Normal affect. Extremities: No clubbing or cyanosis.   Assessment/Plan:  1. Coronary artery disease  Patient has definite coronary artery disease based on coronary calcification seen on CT chests. He really has no cardiopulmonary symptoms suggest angina. ETT a couple of years ago showed no ischemic changes.  ECG normal today.  - Continue ASA 81, ramipril, and statin. 2. Hyperlipidemia I would aim for excellent LDL control with known coronary disease. Lipids recently were near goal. Continue current statin.   I will see him back in a year.   Reginald Davis 06/09/2014 12:05 AM

## 2014-06-18 ENCOUNTER — Other Ambulatory Visit: Payer: Self-pay | Admitting: Cardiology

## 2014-06-25 ENCOUNTER — Other Ambulatory Visit: Payer: Self-pay

## 2014-10-12 DIAGNOSIS — G5761 Lesion of plantar nerve, right lower limb: Secondary | ICD-10-CM | POA: Insufficient documentation

## 2015-01-03 ENCOUNTER — Encounter (INDEPENDENT_AMBULATORY_CARE_PROVIDER_SITE_OTHER): Payer: Commercial Managed Care - PPO | Admitting: Ophthalmology

## 2015-01-03 DIAGNOSIS — H35372 Puckering of macula, left eye: Secondary | ICD-10-CM

## 2015-01-03 DIAGNOSIS — H43813 Vitreous degeneration, bilateral: Secondary | ICD-10-CM

## 2015-01-03 DIAGNOSIS — H33302 Unspecified retinal break, left eye: Secondary | ICD-10-CM | POA: Diagnosis not present

## 2015-01-03 DIAGNOSIS — H35103 Retinopathy of prematurity, unspecified, bilateral: Secondary | ICD-10-CM | POA: Diagnosis not present

## 2015-03-03 ENCOUNTER — Telehealth: Payer: Self-pay | Admitting: Cardiology

## 2015-03-03 NOTE — Telephone Encounter (Signed)
Pt calling because he received a MyChart message to schedule an appt with Dr Aundra Dubin in Sept 2016.  Pt called to schedule an appt with Dr Aundra Dubin. Pt states he was offered an appt with PA, was told Dr Claris Gladden schedule was full. I offered pt appt with PA on a day Dr Aundra Dubin was in the office, he prefers to see Dr Aundra Dubin in October.  Pt states he is not currently having any problems, he already has an appt to see his PCP in Sept.  Pt advised I have placed him on recall list for October with Dr Aundra Dubin, to call back if he has any problems.

## 2015-03-03 NOTE — Telephone Encounter (Signed)
New message      Pt request to talk to [facility] regarding something in [place]

## 2015-03-07 ENCOUNTER — Other Ambulatory Visit: Payer: Self-pay

## 2015-06-01 ENCOUNTER — Encounter: Payer: Self-pay | Admitting: Cardiology

## 2015-06-01 ENCOUNTER — Ambulatory Visit (INDEPENDENT_AMBULATORY_CARE_PROVIDER_SITE_OTHER): Payer: Commercial Managed Care - PPO | Admitting: Cardiology

## 2015-06-01 VITALS — BP 130/82 | HR 72 | Ht 66.0 in | Wt 167.0 lb

## 2015-06-01 DIAGNOSIS — E785 Hyperlipidemia, unspecified: Secondary | ICD-10-CM

## 2015-06-01 DIAGNOSIS — I2510 Atherosclerotic heart disease of native coronary artery without angina pectoris: Secondary | ICD-10-CM | POA: Diagnosis not present

## 2015-06-01 NOTE — Patient Instructions (Signed)
Your physician recommends that you continue on your current medications as directed. Please refer to the Current Medication list given to you today. Your physician has requested that you have an exercise tolerance test. For further information please visit [email or web address]. Please also follow instruction sheet, as given.  Your physician wants you to follow-up in: 1 year with Dr. Aundra Dubin.   You will receive a reminder letter in the mail two months in advance. If you don't receive a letter, please call our office to schedule the follow-up appointment.

## 2015-06-02 NOTE — Progress Notes (Signed)
Patient ID: Reginald Davis, male   DOB: [birth year], 69 y.o.   MRN: [id number] PCP: Dr. Joylene Draft  69 yo with history of hyperlipidemia was initially referred for evaluation of coronary calcification seen on chest CT.  Patient had a chest CT done in 6/12 to screen for lung cancer with history of prior smoking.  This was done at [facility].  The CT showed calcified plaque in the left main and in all three coronary vessels.  Patient quit smoking in 1997.    Reginald Davis is very active in general.  He works out on a treadmill for 20 minutes at a time with 10 minutes on a recumbent bicycle.   He does not get chest pain or heaviness with exertion.  He does note chest "twinges" that last for seconds and are gone.  He will note some mild dyspnea if he runs up a flight of steps but no shortness of breath after climbing a couple of flights at normal pace.  No palpitations.  BP has been well-controlled. He is able to tolerate atorvastatin 20 mg daily without leg cramps.   ECG: NSR with PAC  Labs (1/12): K 4.5, creatinine 0.8, LDL 88, HDL 53 Labs (10/12): LDL 77, HDL 49 Labs (5/13): LDL 76, HDL 45, K 4.3, creatinine 0.8 Labs (10/13): LDL 80, HDL 45 Labs (7/15): LDL 80, HDL 48, TSH normal, K 5.1, creatinine 0.8  PMH: 1. Hyperlipidemia 2. H/o Varicella zoster 3. Degenerative disc disease with history of low back and c-spine area pain 4. GERD 5. Diverticulosis 6. Impaired fasting glucose 7. BPH 8. CAD: Coronary calcification noted on CT chest from 6/12 (to screen for lung cancer in former smoker).  ETT (10/12) with no ischemic changes.  Transient LBBB during recovery.  9. 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 Pulmonary nodules: CT chest (9/15) showed coronary calcification along with a 3 mm LUL nodule. 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  SH: Former smoker (quit 1997).  Married, 2 children.  Started [employer] in [place], later sold it.  Mostly retired now.  Occasional ETOH.    FH: Father with CAD found in his early 80s, had CABG in his late 18s.  Uncle with PCI.  One brother, no known CAD.   ROS: All systems reviewed and negative except as per HPI.    Current Outpatient Prescriptions  Medication Sig Dispense Refill  . Alpha-Lipoic Acid 200 MG CAPS Two daily  0  . aspirin 81 MG tablet Take 81 mg by mouth daily.      Marland Kitchen atorvastatin (LIPITOR) 20 MG tablet Take 20 mg by mouth daily.    . calcium-vitamin D (CALCIUM 500/D) 500-200 MG-UNIT per tablet Take 1 tablet by mouth daily.    . Coenzyme Q10 300 MG CAPS Take by mouth.  0  . fexofenadine (ALLEGRA) 180 MG tablet Take 1 tablet by mouth daily.    . fluocinonide (LIDEX) 0.05 % external solution Apply 1 application topically as needed.     . fluticasone (FLONASE) 50 MCG/ACT nasal spray     . guaiFENesin (MUCINEX) 600 MG 12 hr tablet Take 1,200 mg by mouth 2 (two) times daily.    Marland Kitchen loteprednol (LOTEMAX) 0.2 % SUSP Place 1 drop into both eyes 2 (two) times daily.    . Multiple Vitamins-Minerals (CENTRUM SILVER PO) Take 1 tablet by mouth daily.      . Omega-3 Fatty Acids (FISH OIL CONCENTRATE PO) 4800MG  DAILY     . ramipril (ALTACE) 1.25  MG capsule take 1 capsule by mouth once daily 30 capsule 11  . Salicylic Acid 6 % SHAM     . vitamin C (ASCORBIC ACID) 500 MG tablet Take 500 mg by mouth daily.      Marland Kitchen Zn-Pyg Afri-Nettle-Saw Palmet (SAW PALMETTO COMPLEX PO) Take 1 tablet by mouth daily.       No current facility-administered medications for this visit.    BP 130/82 mmHg  Pulse 72  Ht 5\' 6"  (1.676 m)  Wt 167 lb (75.751 kg)  BMI 26.97 kg/m2 General: NAD Neck: No JVD, no thyromegaly or thyroid nodule.  Lungs: Clear to auscultation bilaterally with normal respiratory effort. CV: Nondisplaced PMI.  Heart regular S1/S2, soft S4, no murmur.  No peripheral edema.  No carotid bruit.  Normal pedal pulses.  Abdomen: Soft, nontender, no hepatosplenomegaly, no distention.  Neurologic: Alert and oriented x 3.  Psych:  Normal affect. Extremities: No clubbing or cyanosis.   Assessment/Plan:  1. Coronary artery disease  Patient has definite coronary artery disease based on coronary calcification seen on CT chests. He really has no cardiopulmonary symptoms suggest angina. ECG normal today.  - Continue ASA 81, ramipril, and statin. - I will arrange for ETT for risk stratification.  2. Hyperlipidemia I would aim for excellent LDL control with known coronary disease. I will try to get his most recent labs from Dr Joylene Draft. Continue current statin.   I will see him back in a year.   Loralie Champagne 06/02/2015

## 2015-06-15 ENCOUNTER — Encounter: Payer: Self-pay | Admitting: Cardiology

## 2015-06-16 ENCOUNTER — Other Ambulatory Visit: Payer: Self-pay | Admitting: Cardiology

## 2015-06-17 ENCOUNTER — Ambulatory Visit: Payer: Commercial Managed Care - PPO | Admitting: Cardiology

## 2015-06-21 ENCOUNTER — Other Ambulatory Visit: Payer: Self-pay | Admitting: *Deleted

## 2015-06-30 ENCOUNTER — Ambulatory Visit (INDEPENDENT_AMBULATORY_CARE_PROVIDER_SITE_OTHER): Payer: Commercial Managed Care - PPO

## 2015-06-30 ENCOUNTER — Other Ambulatory Visit: Payer: Self-pay | Admitting: Physician Assistant

## 2015-06-30 ENCOUNTER — Encounter: Payer: Commercial Managed Care - PPO | Admitting: Physician Assistant

## 2015-06-30 DIAGNOSIS — I2510 Atherosclerotic heart disease of native coronary artery without angina pectoris: Secondary | ICD-10-CM | POA: Diagnosis not present

## 2015-06-30 DIAGNOSIS — E785 Hyperlipidemia, unspecified: Secondary | ICD-10-CM

## 2015-06-30 DIAGNOSIS — I447 Left bundle-branch block, unspecified: Secondary | ICD-10-CM

## 2015-06-30 LAB — EXERCISE TOLERANCE TEST
CHL CUP MPHR: 151 {beats}/min
CSEPEW: 3 METS
CSEPPHR: 105 {beats}/min
Exercise duration (min): 1 min
Exercise duration (sec): 7 s
Rest HR: 76 {beats}/min

## 2015-07-04 ENCOUNTER — Telehealth (HOSPITAL_COMMUNITY): Payer: Self-pay | Admitting: *Deleted

## 2015-07-04 NOTE — Telephone Encounter (Signed)
Patient given detailed instructions per Myocardial Perfusion Study Information Sheet for the test on 07/06/15 at 0715. Patient notified to arrive 15 minutes early and that it is imperative to arrive on time for appointment to keep from having the test rescheduled.  If you need to cancel or reschedule your appointment, please call the office within 24 hours of your appointment. Failure to do so may result in a cancellation of your appointment, and a $50 no show fee. Patient verbalized understanding.Korinne Greenstein, [place]anae [place]

## 2015-07-06 ENCOUNTER — Ambulatory Visit (HOSPITAL_COMMUNITY): Payer: Commercial Managed Care - PPO | Attending: Cardiology

## 2015-07-06 DIAGNOSIS — I447 Left bundle-branch block, unspecified: Secondary | ICD-10-CM | POA: Diagnosis not present

## 2015-07-06 DIAGNOSIS — R079 Chest pain, unspecified: Secondary | ICD-10-CM | POA: Insufficient documentation

## 2015-07-06 DIAGNOSIS — R0609 Other forms of dyspnea: Secondary | ICD-10-CM | POA: Diagnosis not present

## 2015-07-06 DIAGNOSIS — I2510 Atherosclerotic heart disease of native coronary artery without angina pectoris: Secondary | ICD-10-CM | POA: Insufficient documentation

## 2015-07-06 DIAGNOSIS — I10 Essential (primary) hypertension: Secondary | ICD-10-CM | POA: Insufficient documentation

## 2015-07-06 DIAGNOSIS — Z8249 Family history of ischemic heart disease and other diseases of the circulatory system: Secondary | ICD-10-CM | POA: Diagnosis not present

## 2015-07-06 LAB — MYOCARDIAL PERFUSION IMAGING
CHL CUP NUCLEAR SDS: 2
CHL CUP NUCLEAR SRS: 4
LV dias vol: 95 mL
LV sys vol: 48 mL
NUC STRESS TID: 1.16
Peak HR: 88 {beats}/min
RATE: 0.26
Rest HR: 62 {beats}/min
SSS: 6

## 2015-07-06 MED ORDER — REGADENOSON 0.4 MG/5ML IV SOLN
0.4000 mg | Freq: Once | INTRAVENOUS | Status: AC
  Administered 2015-07-06: 0.4 mg via INTRAVENOUS

## 2015-07-06 MED ORDER — TECHNETIUM TC 99M SESTAMIBI GENERIC - CARDIOLITE
32.8000 | Freq: Once | INTRAVENOUS | Status: AC | PRN
  Administered 2015-07-06: 32.8 via INTRAVENOUS

## 2015-07-06 MED ORDER — TECHNETIUM TC 99M SESTAMIBI GENERIC - CARDIOLITE
10.6000 | Freq: Once | INTRAVENOUS | Status: AC | PRN
  Administered 2015-07-06: 11 via INTRAVENOUS

## 2015-07-07 ENCOUNTER — Telehealth: Payer: Self-pay | Admitting: *Deleted

## 2015-07-07 ENCOUNTER — Encounter: Payer: Self-pay | Admitting: Physician Assistant

## 2015-07-07 NOTE — Telephone Encounter (Signed)
Pt notified of myoview results by phone with verbal understanding 

## 2015-07-08 ENCOUNTER — Other Ambulatory Visit: Payer: Self-pay | Admitting: *Deleted

## 2015-07-08 DIAGNOSIS — I2510 Atherosclerotic heart disease of native coronary artery without angina pectoris: Secondary | ICD-10-CM

## 2015-07-08 DIAGNOSIS — R9430 Abnormal result of cardiovascular function study, unspecified: Secondary | ICD-10-CM

## 2015-07-11 ENCOUNTER — Telehealth: Payer: Self-pay | Admitting: Cardiology

## 2015-07-11 NOTE — Telephone Encounter (Signed)
New Message  Pt returned call states that he is suppposed to schedule a procedure. Requests a call back from the nurse.

## 2015-07-11 NOTE — Telephone Encounter (Signed)
I will forward to [facility]--pt needs to schedule echocardiogram, order in Epic.

## 2015-07-13 ENCOUNTER — Telehealth: Payer: Self-pay | Admitting: Acute Care

## 2015-07-13 NOTE — Telephone Encounter (Signed)
Left Message to make Appointment for Reginald Davis  °

## 2015-07-14 ENCOUNTER — Ambulatory Visit (HOSPITAL_COMMUNITY): Payer: Commercial Managed Care - PPO | Attending: Internal Medicine

## 2015-07-14 ENCOUNTER — Other Ambulatory Visit: Payer: Self-pay

## 2015-07-14 DIAGNOSIS — Z8249 Family history of ischemic heart disease and other diseases of the circulatory system: Secondary | ICD-10-CM | POA: Diagnosis not present

## 2015-07-14 DIAGNOSIS — E785 Hyperlipidemia, unspecified: Secondary | ICD-10-CM | POA: Diagnosis not present

## 2015-07-14 DIAGNOSIS — Z87891 Personal history of nicotine dependence: Secondary | ICD-10-CM | POA: Diagnosis not present

## 2015-07-14 DIAGNOSIS — I517 Cardiomegaly: Secondary | ICD-10-CM | POA: Diagnosis not present

## 2015-07-14 DIAGNOSIS — I2510 Atherosclerotic heart disease of native coronary artery without angina pectoris: Secondary | ICD-10-CM | POA: Insufficient documentation

## 2015-07-14 DIAGNOSIS — R9430 Abnormal result of cardiovascular function study, unspecified: Secondary | ICD-10-CM

## 2015-07-14 DIAGNOSIS — I447 Left bundle-branch block, unspecified: Secondary | ICD-10-CM | POA: Insufficient documentation

## 2015-07-15 ENCOUNTER — Other Ambulatory Visit: Payer: Self-pay | Admitting: Acute Care

## 2015-07-19 ENCOUNTER — Other Ambulatory Visit: Payer: Self-pay | Admitting: Acute Care

## 2015-07-19 NOTE — Telephone Encounter (Signed)
I called Reginald Davis to let him know that he does not qualify for the screening program. He quit smoking in 1997, which is greater than 15 years ago,and as a result does not qualify for the program.He verbalized understanding. I also let [name] know that this patient does not qualify for the program due to being a former smoker with >15 years since he quit.

## 2015-07-20 ENCOUNTER — Other Ambulatory Visit: Payer: Self-pay | Admitting: Internal Medicine

## 2015-07-20 DIAGNOSIS — R918 Other nonspecific abnormal finding of lung field: Secondary | ICD-10-CM

## 2015-07-27 ENCOUNTER — Ambulatory Visit
Admission: RE | Admit: 2015-07-27 | Discharge: 2015-07-27 | Disposition: A | Discharge status: Home / Self Care | Payer: Commercial Managed Care - PPO | Source: Ambulatory Visit | Attending: Internal Medicine | Admitting: Internal Medicine

## 2015-07-27 DIAGNOSIS — R918 Other nonspecific abnormal finding of lung field: Secondary | ICD-10-CM

## 2015-10-18 DIAGNOSIS — H33303 Unspecified retinal break, bilateral: Secondary | ICD-10-CM | POA: Diagnosis not present

## 2015-10-18 DIAGNOSIS — H43391 Other vitreous opacities, right eye: Secondary | ICD-10-CM | POA: Diagnosis not present

## 2015-10-18 DIAGNOSIS — Z961 Presence of intraocular lens: Secondary | ICD-10-CM | POA: Diagnosis not present

## 2015-10-18 DIAGNOSIS — H52222 Regular astigmatism, left eye: Secondary | ICD-10-CM | POA: Diagnosis not present

## 2015-10-18 DIAGNOSIS — H04123 Dry eye syndrome of bilateral lacrimal glands: Secondary | ICD-10-CM | POA: Diagnosis not present

## 2015-10-18 DIAGNOSIS — H5212 Myopia, left eye: Secondary | ICD-10-CM | POA: Diagnosis not present

## 2015-11-08 DIAGNOSIS — R197 Diarrhea, unspecified: Secondary | ICD-10-CM | POA: Diagnosis not present

## 2015-11-08 DIAGNOSIS — Z6827 Body mass index (BMI) 27.0-27.9, adult: Secondary | ICD-10-CM | POA: Diagnosis not present

## 2015-11-08 DIAGNOSIS — R109 Unspecified abdominal pain: Secondary | ICD-10-CM | POA: Diagnosis not present

## 2015-11-17 DIAGNOSIS — M25511 Pain in right shoulder: Secondary | ICD-10-CM | POA: Diagnosis not present

## 2015-11-17 DIAGNOSIS — M7541 Impingement syndrome of right shoulder: Secondary | ICD-10-CM | POA: Diagnosis not present

## 2015-11-28 ENCOUNTER — Other Ambulatory Visit: Payer: Self-pay | Admitting: Orthopaedic Surgery

## 2015-11-28 DIAGNOSIS — M25511 Pain in right shoulder: Secondary | ICD-10-CM

## 2015-11-28 DIAGNOSIS — R509 Fever, unspecified: Secondary | ICD-10-CM | POA: Diagnosis not present

## 2015-12-07 ENCOUNTER — Ambulatory Visit
Admission: RE | Admit: 2015-12-07 | Discharge: 2015-12-07 | Disposition: A | Discharge status: Home / Self Care | Payer: Medicare Other | Source: Ambulatory Visit | Attending: Orthopaedic Surgery | Admitting: Orthopaedic Surgery

## 2015-12-07 DIAGNOSIS — S46011A Strain of muscle(s) and tendon(s) of the rotator cuff of right shoulder, initial encounter: Secondary | ICD-10-CM | POA: Diagnosis not present

## 2015-12-07 DIAGNOSIS — M25511 Pain in right shoulder: Secondary | ICD-10-CM

## 2015-12-12 DIAGNOSIS — M7540 Impingement syndrome of unspecified shoulder: Secondary | ICD-10-CM | POA: Diagnosis not present

## 2015-12-12 DIAGNOSIS — M75121 Complete rotator cuff tear or rupture of right shoulder, not specified as traumatic: Secondary | ICD-10-CM | POA: Diagnosis not present

## 2015-12-12 DIAGNOSIS — M25511 Pain in right shoulder: Secondary | ICD-10-CM | POA: Diagnosis not present

## 2015-12-13 DIAGNOSIS — H33102 Unspecified retinoschisis, left eye: Secondary | ICD-10-CM | POA: Diagnosis not present

## 2015-12-13 DIAGNOSIS — H35103 Retinopathy of prematurity, unspecified, bilateral: Secondary | ICD-10-CM | POA: Diagnosis not present

## 2015-12-13 DIAGNOSIS — H35372 Puckering of macula, left eye: Secondary | ICD-10-CM | POA: Diagnosis not present

## 2015-12-29 DIAGNOSIS — M75111 Incomplete rotator cuff tear or rupture of right shoulder, not specified as traumatic: Secondary | ICD-10-CM | POA: Diagnosis not present

## 2015-12-29 DIAGNOSIS — M66811 Spontaneous rupture of other tendons, right shoulder: Secondary | ICD-10-CM | POA: Diagnosis not present

## 2015-12-29 DIAGNOSIS — M7541 Impingement syndrome of right shoulder: Secondary | ICD-10-CM | POA: Diagnosis not present

## 2015-12-29 DIAGNOSIS — M659 Synovitis and tenosynovitis, unspecified: Secondary | ICD-10-CM | POA: Diagnosis not present

## 2015-12-29 DIAGNOSIS — M75121 Complete rotator cuff tear or rupture of right shoulder, not specified as traumatic: Secondary | ICD-10-CM | POA: Diagnosis not present

## 2015-12-29 DIAGNOSIS — G8918 Other acute postprocedural pain: Secondary | ICD-10-CM | POA: Diagnosis not present

## 2015-12-29 DIAGNOSIS — M19011 Primary osteoarthritis, right shoulder: Secondary | ICD-10-CM | POA: Diagnosis not present

## 2016-01-05 DIAGNOSIS — M75121 Complete rotator cuff tear or rupture of right shoulder, not specified as traumatic: Secondary | ICD-10-CM | POA: Diagnosis not present

## 2016-01-05 DIAGNOSIS — R531 Weakness: Secondary | ICD-10-CM | POA: Diagnosis not present

## 2016-01-12 DIAGNOSIS — R531 Weakness: Secondary | ICD-10-CM | POA: Diagnosis not present

## 2016-01-12 DIAGNOSIS — M75121 Complete rotator cuff tear or rupture of right shoulder, not specified as traumatic: Secondary | ICD-10-CM | POA: Diagnosis not present

## 2016-01-16 DIAGNOSIS — R531 Weakness: Secondary | ICD-10-CM | POA: Diagnosis not present

## 2016-01-16 DIAGNOSIS — M75121 Complete rotator cuff tear or rupture of right shoulder, not specified as traumatic: Secondary | ICD-10-CM | POA: Diagnosis not present

## 2016-01-17 DIAGNOSIS — M75121 Complete rotator cuff tear or rupture of right shoulder, not specified as traumatic: Secondary | ICD-10-CM | POA: Diagnosis not present

## 2016-01-17 DIAGNOSIS — R531 Weakness: Secondary | ICD-10-CM | POA: Diagnosis not present

## 2016-01-19 DIAGNOSIS — R531 Weakness: Secondary | ICD-10-CM | POA: Diagnosis not present

## 2016-01-19 DIAGNOSIS — M75121 Complete rotator cuff tear or rupture of right shoulder, not specified as traumatic: Secondary | ICD-10-CM | POA: Diagnosis not present

## 2016-01-23 DIAGNOSIS — M75121 Complete rotator cuff tear or rupture of right shoulder, not specified as traumatic: Secondary | ICD-10-CM | POA: Diagnosis not present

## 2016-01-23 DIAGNOSIS — R531 Weakness: Secondary | ICD-10-CM | POA: Diagnosis not present

## 2016-01-24 DIAGNOSIS — R531 Weakness: Secondary | ICD-10-CM | POA: Diagnosis not present

## 2016-01-24 DIAGNOSIS — M75121 Complete rotator cuff tear or rupture of right shoulder, not specified as traumatic: Secondary | ICD-10-CM | POA: Diagnosis not present

## 2016-01-25 DIAGNOSIS — R531 Weakness: Secondary | ICD-10-CM | POA: Diagnosis not present

## 2016-01-25 DIAGNOSIS — M75121 Complete rotator cuff tear or rupture of right shoulder, not specified as traumatic: Secondary | ICD-10-CM | POA: Diagnosis not present

## 2016-01-30 DIAGNOSIS — M75121 Complete rotator cuff tear or rupture of right shoulder, not specified as traumatic: Secondary | ICD-10-CM | POA: Diagnosis not present

## 2016-01-30 DIAGNOSIS — R531 Weakness: Secondary | ICD-10-CM | POA: Diagnosis not present

## 2016-02-01 DIAGNOSIS — M75121 Complete rotator cuff tear or rupture of right shoulder, not specified as traumatic: Secondary | ICD-10-CM | POA: Diagnosis not present

## 2016-02-01 DIAGNOSIS — R531 Weakness: Secondary | ICD-10-CM | POA: Diagnosis not present

## 2016-02-03 DIAGNOSIS — R531 Weakness: Secondary | ICD-10-CM | POA: Diagnosis not present

## 2016-02-03 DIAGNOSIS — M75121 Complete rotator cuff tear or rupture of right shoulder, not specified as traumatic: Secondary | ICD-10-CM | POA: Diagnosis not present

## 2016-02-07 DIAGNOSIS — M75121 Complete rotator cuff tear or rupture of right shoulder, not specified as traumatic: Secondary | ICD-10-CM | POA: Diagnosis not present

## 2016-02-07 DIAGNOSIS — R531 Weakness: Secondary | ICD-10-CM | POA: Diagnosis not present

## 2016-02-09 DIAGNOSIS — M75121 Complete rotator cuff tear or rupture of right shoulder, not specified as traumatic: Secondary | ICD-10-CM | POA: Diagnosis not present

## 2016-02-09 DIAGNOSIS — R531 Weakness: Secondary | ICD-10-CM | POA: Diagnosis not present

## 2016-02-10 DIAGNOSIS — R531 Weakness: Secondary | ICD-10-CM | POA: Diagnosis not present

## 2016-02-10 DIAGNOSIS — M75121 Complete rotator cuff tear or rupture of right shoulder, not specified as traumatic: Secondary | ICD-10-CM | POA: Diagnosis not present

## 2016-02-13 DIAGNOSIS — R531 Weakness: Secondary | ICD-10-CM | POA: Diagnosis not present

## 2016-02-13 DIAGNOSIS — M75121 Complete rotator cuff tear or rupture of right shoulder, not specified as traumatic: Secondary | ICD-10-CM | POA: Diagnosis not present

## 2016-02-15 DIAGNOSIS — R531 Weakness: Secondary | ICD-10-CM | POA: Diagnosis not present

## 2016-02-15 DIAGNOSIS — M75121 Complete rotator cuff tear or rupture of right shoulder, not specified as traumatic: Secondary | ICD-10-CM | POA: Diagnosis not present

## 2016-02-17 DIAGNOSIS — R531 Weakness: Secondary | ICD-10-CM | POA: Diagnosis not present

## 2016-02-17 DIAGNOSIS — M75121 Complete rotator cuff tear or rupture of right shoulder, not specified as traumatic: Secondary | ICD-10-CM | POA: Diagnosis not present

## 2016-02-20 DIAGNOSIS — M75121 Complete rotator cuff tear or rupture of right shoulder, not specified as traumatic: Secondary | ICD-10-CM | POA: Diagnosis not present

## 2016-02-20 DIAGNOSIS — R531 Weakness: Secondary | ICD-10-CM | POA: Diagnosis not present

## 2016-02-21 DIAGNOSIS — M75121 Complete rotator cuff tear or rupture of right shoulder, not specified as traumatic: Secondary | ICD-10-CM | POA: Diagnosis not present

## 2016-02-21 DIAGNOSIS — R531 Weakness: Secondary | ICD-10-CM | POA: Diagnosis not present

## 2016-02-27 DIAGNOSIS — M75121 Complete rotator cuff tear or rupture of right shoulder, not specified as traumatic: Secondary | ICD-10-CM | POA: Diagnosis not present

## 2016-02-27 DIAGNOSIS — R531 Weakness: Secondary | ICD-10-CM | POA: Diagnosis not present

## 2016-02-29 DIAGNOSIS — M75121 Complete rotator cuff tear or rupture of right shoulder, not specified as traumatic: Secondary | ICD-10-CM | POA: Diagnosis not present

## 2016-02-29 DIAGNOSIS — R531 Weakness: Secondary | ICD-10-CM | POA: Diagnosis not present

## 2016-03-05 DIAGNOSIS — M75121 Complete rotator cuff tear or rupture of right shoulder, not specified as traumatic: Secondary | ICD-10-CM | POA: Diagnosis not present

## 2016-03-05 DIAGNOSIS — R531 Weakness: Secondary | ICD-10-CM | POA: Diagnosis not present

## 2016-03-08 DIAGNOSIS — R531 Weakness: Secondary | ICD-10-CM | POA: Diagnosis not present

## 2016-03-08 DIAGNOSIS — M75121 Complete rotator cuff tear or rupture of right shoulder, not specified as traumatic: Secondary | ICD-10-CM | POA: Diagnosis not present

## 2016-03-12 DIAGNOSIS — R531 Weakness: Secondary | ICD-10-CM | POA: Diagnosis not present

## 2016-03-12 DIAGNOSIS — M75121 Complete rotator cuff tear or rupture of right shoulder, not specified as traumatic: Secondary | ICD-10-CM | POA: Diagnosis not present

## 2016-03-15 DIAGNOSIS — R531 Weakness: Secondary | ICD-10-CM | POA: Diagnosis not present

## 2016-03-15 DIAGNOSIS — M75121 Complete rotator cuff tear or rupture of right shoulder, not specified as traumatic: Secondary | ICD-10-CM | POA: Diagnosis not present

## 2016-03-19 DIAGNOSIS — R531 Weakness: Secondary | ICD-10-CM | POA: Diagnosis not present

## 2016-03-19 DIAGNOSIS — M75121 Complete rotator cuff tear or rupture of right shoulder, not specified as traumatic: Secondary | ICD-10-CM | POA: Diagnosis not present

## 2016-03-22 DIAGNOSIS — M75121 Complete rotator cuff tear or rupture of right shoulder, not specified as traumatic: Secondary | ICD-10-CM | POA: Diagnosis not present

## 2016-03-22 DIAGNOSIS — R531 Weakness: Secondary | ICD-10-CM | POA: Diagnosis not present

## 2016-03-26 DIAGNOSIS — M75121 Complete rotator cuff tear or rupture of right shoulder, not specified as traumatic: Secondary | ICD-10-CM | POA: Diagnosis not present

## 2016-03-26 DIAGNOSIS — R531 Weakness: Secondary | ICD-10-CM | POA: Diagnosis not present

## 2016-03-29 DIAGNOSIS — M75121 Complete rotator cuff tear or rupture of right shoulder, not specified as traumatic: Secondary | ICD-10-CM | POA: Diagnosis not present

## 2016-03-29 DIAGNOSIS — R531 Weakness: Secondary | ICD-10-CM | POA: Diagnosis not present

## 2016-04-11 DIAGNOSIS — M75121 Complete rotator cuff tear or rupture of right shoulder, not specified as traumatic: Secondary | ICD-10-CM | POA: Diagnosis not present

## 2016-04-11 DIAGNOSIS — R531 Weakness: Secondary | ICD-10-CM | POA: Diagnosis not present

## 2016-06-05 DIAGNOSIS — Z23 Encounter for immunization: Secondary | ICD-10-CM | POA: Diagnosis not present

## 2016-06-12 DIAGNOSIS — H35103 Retinopathy of prematurity, unspecified, bilateral: Secondary | ICD-10-CM | POA: Diagnosis not present

## 2016-06-12 DIAGNOSIS — H33102 Unspecified retinoschisis, left eye: Secondary | ICD-10-CM | POA: Diagnosis not present

## 2016-06-12 DIAGNOSIS — H35372 Puckering of macula, left eye: Secondary | ICD-10-CM | POA: Diagnosis not present

## 2016-06-12 DIAGNOSIS — Z961 Presence of intraocular lens: Secondary | ICD-10-CM | POA: Diagnosis not present

## 2016-06-19 ENCOUNTER — Other Ambulatory Visit: Payer: Self-pay

## 2016-06-19 MED ORDER — RAMIPRIL 1.25 MG PO CAPS: 1.2500 mg | ORAL_CAPSULE | Freq: Every day | ORAL | 2 refills | Status: DC

## 2016-06-25 DIAGNOSIS — E784 Other hyperlipidemia: Secondary | ICD-10-CM | POA: Diagnosis not present

## 2016-06-25 DIAGNOSIS — Z125 Encounter for screening for malignant neoplasm of prostate: Secondary | ICD-10-CM | POA: Diagnosis not present

## 2016-06-25 DIAGNOSIS — R7301 Impaired fasting glucose: Secondary | ICD-10-CM | POA: Diagnosis not present

## 2016-06-25 DIAGNOSIS — D7589 Other specified diseases of blood and blood-forming organs: Secondary | ICD-10-CM | POA: Diagnosis not present

## 2016-07-02 DIAGNOSIS — K4090 Unilateral inguinal hernia, without obstruction or gangrene, not specified as recurrent: Secondary | ICD-10-CM | POA: Diagnosis not present

## 2016-07-02 DIAGNOSIS — R918 Other nonspecific abnormal finding of lung field: Secondary | ICD-10-CM | POA: Diagnosis not present

## 2016-07-02 DIAGNOSIS — Z6828 Body mass index (BMI) 28.0-28.9, adult: Secondary | ICD-10-CM | POA: Diagnosis not present

## 2016-07-02 DIAGNOSIS — Z1389 Encounter for screening for other disorder: Secondary | ICD-10-CM | POA: Diagnosis not present

## 2016-07-02 DIAGNOSIS — I2583 Coronary atherosclerosis due to lipid rich plaque: Secondary | ICD-10-CM | POA: Diagnosis not present

## 2016-07-02 DIAGNOSIS — D7589 Other specified diseases of blood and blood-forming organs: Secondary | ICD-10-CM | POA: Diagnosis not present

## 2016-07-02 DIAGNOSIS — R0609 Other forms of dyspnea: Secondary | ICD-10-CM | POA: Diagnosis not present

## 2016-07-02 DIAGNOSIS — M5380 Other specified dorsopathies, site unspecified: Secondary | ICD-10-CM | POA: Diagnosis not present

## 2016-07-02 DIAGNOSIS — Z0000 Encounter for general adult medical examination without abnormal findings: Secondary | ICD-10-CM | POA: Diagnosis not present

## 2016-07-02 DIAGNOSIS — R972 Elevated prostate specific antigen [PSA]: Secondary | ICD-10-CM | POA: Diagnosis not present

## 2016-07-02 DIAGNOSIS — N401 Enlarged prostate with lower urinary tract symptoms: Secondary | ICD-10-CM | POA: Diagnosis not present

## 2016-07-02 DIAGNOSIS — K649 Unspecified hemorrhoids: Secondary | ICD-10-CM | POA: Diagnosis not present

## 2016-07-02 DIAGNOSIS — E784 Other hyperlipidemia: Secondary | ICD-10-CM | POA: Diagnosis not present

## 2016-07-02 DIAGNOSIS — R351 Nocturia: Secondary | ICD-10-CM | POA: Diagnosis not present

## 2016-07-02 DIAGNOSIS — N4340 Spermatocele of epididymis, unspecified: Secondary | ICD-10-CM | POA: Diagnosis not present

## 2016-07-11 DIAGNOSIS — Z1212 Encounter for screening for malignant neoplasm of rectum: Secondary | ICD-10-CM | POA: Diagnosis not present

## 2016-07-18 DIAGNOSIS — M10072 Idiopathic gout, left ankle and foot: Secondary | ICD-10-CM | POA: Diagnosis not present

## 2016-07-18 DIAGNOSIS — Z6827 Body mass index (BMI) 27.0-27.9, adult: Secondary | ICD-10-CM | POA: Diagnosis not present

## 2016-07-18 DIAGNOSIS — R0600 Dyspnea, unspecified: Secondary | ICD-10-CM | POA: Diagnosis not present

## 2016-07-18 DIAGNOSIS — M109 Gout, unspecified: Secondary | ICD-10-CM | POA: Diagnosis not present

## 2016-08-08 DIAGNOSIS — H5212 Myopia, left eye: Secondary | ICD-10-CM | POA: Diagnosis not present

## 2016-08-08 DIAGNOSIS — Z961 Presence of intraocular lens: Secondary | ICD-10-CM | POA: Diagnosis not present

## 2016-08-08 DIAGNOSIS — H52222 Regular astigmatism, left eye: Secondary | ICD-10-CM | POA: Diagnosis not present

## 2016-08-08 DIAGNOSIS — H33302 Unspecified retinal break, left eye: Secondary | ICD-10-CM | POA: Diagnosis not present

## 2016-08-08 DIAGNOSIS — Z9842 Cataract extraction status, left eye: Secondary | ICD-10-CM | POA: Diagnosis not present

## 2016-08-17 DIAGNOSIS — L218 Other seborrheic dermatitis: Secondary | ICD-10-CM | POA: Diagnosis not present

## 2016-08-17 DIAGNOSIS — L570 Actinic keratosis: Secondary | ICD-10-CM | POA: Diagnosis not present

## 2016-08-17 DIAGNOSIS — D225 Melanocytic nevi of trunk: Secondary | ICD-10-CM | POA: Diagnosis not present

## 2016-08-17 DIAGNOSIS — L821 Other seborrheic keratosis: Secondary | ICD-10-CM | POA: Diagnosis not present

## 2016-08-17 DIAGNOSIS — L411 Pityriasis lichenoides chronica: Secondary | ICD-10-CM | POA: Diagnosis not present

## 2016-08-17 DIAGNOSIS — D1801 Hemangioma of skin and subcutaneous tissue: Secondary | ICD-10-CM | POA: Diagnosis not present

## 2016-08-17 DIAGNOSIS — D485 Neoplasm of uncertain behavior of skin: Secondary | ICD-10-CM | POA: Diagnosis not present

## 2016-08-17 DIAGNOSIS — L812 Freckles: Secondary | ICD-10-CM | POA: Diagnosis not present

## 2016-08-22 DIAGNOSIS — Z6827 Body mass index (BMI) 27.0-27.9, adult: Secondary | ICD-10-CM | POA: Diagnosis not present

## 2016-08-22 DIAGNOSIS — R590 Localized enlarged lymph nodes: Secondary | ICD-10-CM | POA: Diagnosis not present

## 2016-08-22 DIAGNOSIS — M10072 Idiopathic gout, left ankle and foot: Secondary | ICD-10-CM | POA: Diagnosis not present

## 2016-08-22 DIAGNOSIS — R0609 Other forms of dyspnea: Secondary | ICD-10-CM | POA: Diagnosis not present

## 2016-08-29 ENCOUNTER — Encounter: Payer: Self-pay | Admitting: Cardiology

## 2016-08-29 ENCOUNTER — Encounter: Payer: Self-pay | Admitting: *Deleted

## 2016-08-29 ENCOUNTER — Ambulatory Visit (INDEPENDENT_AMBULATORY_CARE_PROVIDER_SITE_OTHER): Payer: Medicare Other | Admitting: Cardiology

## 2016-08-29 VITALS — BP 131/87 | HR 78 | Ht 66.0 in | Wt 163.0 lb

## 2016-08-29 DIAGNOSIS — E782 Mixed hyperlipidemia: Secondary | ICD-10-CM

## 2016-08-29 DIAGNOSIS — I2510 Atherosclerotic heart disease of native coronary artery without angina pectoris: Secondary | ICD-10-CM | POA: Diagnosis not present

## 2016-08-29 NOTE — Patient Instructions (Signed)
Medication Instructions:  Your physician recommends that you continue on your current medications as directed. Please refer to the Current Medication list given to you today.   Labwork: None   Testing/Procedures: None   Follow-Up: Your physician wants you to follow-up in: 1 year with Dr End. (December 2018). You will receive a reminder letter in the mail two months in advance. If you don't receive a letter, please call our office to schedule the follow-up appointment.   .     If you need a refill on your cardiac medications before your next appointment, please call your pharmacy.

## 2016-08-31 NOTE — Progress Notes (Signed)
Patient ID: Reginald Davis, male   DOB: 03-Jul-1946, 70 y.o.   MRN: [id number] PCP: Dr. Joylene Draft  70 yo with history of hyperlipidemia was initially referred for evaluation of coronary calcification seen on chest CT.  Patient had a chest CT done in 6/12 to screen for lung cancer with history of prior smoking.  This was done at [facility].  The CT showed calcified plaque in the left main and in all three coronary vessels.  Patient quit smoking in 1997.  He had a Cardiolite in 10/16 that showed no ischemia or infarction, but EF was calculated at 49%.  Echo was done to check the EF in 11/16, EF 55-60% by echo.   Mr Dahms is very active in general.  He gets 25-40 minutes of aerobic exercise a day.  He does not get chest pain or heaviness with exertion.  No dyspnea.  SBP in 110s-120s at home. Weight down 4 lbs.   ECG: NSR, nonspecific inferior T wave flattening  Labs (1/12): K 4.5, creatinine 0.8, LDL 88, HDL 53 Labs (10/12): LDL 77, HDL 49 Labs (5/13): LDL 76, HDL 45, K 4.3, creatinine 0.8 Labs (10/13): LDL 80, HDL 45 Labs (7/15): LDL 80, HDL 48, TSH normal, K 5.1, creatinine 0.8 Labs (10/17): K 5, creatinine 0.8, LDL 71, HDL 42  PMH: 1. Hyperlipidemia 2. H/o Varicella zoster 3. Degenerative disc disease with history of low back and c-spine area pain 4. GERD 5. Diverticulosis 6. Impaired fasting glucose 7. BPH 8. CAD: Coronary calcification noted on CT chest from 6/12 (to screen for lung cancer in former smoker).  ETT (10/12) with no ischemic changes.  Transient LBBB during recovery.  - Cardiolite (10/16): EF 49%, no ischemia/infarction. - Echo (11/16): EF 55-60%, mild LVH.  9. 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 Pulmonary nodules: CT chest (9/15) showed coronary calcification along with a 3 mm LUL nodule. 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 13. Gout  SH: Former smoker (quit 1997).  Married, 2 children.  Started [employer] in [place], later sold it.   Mostly retired now.  Occasional ETOH.   FH: Father with CAD found in his early 63s, had CABG in his late 53s.  Uncle with PCI.  One brother, no known CAD.   ROS: All systems reviewed and negative except as per HPI.    Current Outpatient Prescriptions  Medication Sig Dispense Refill  . allopurinol (ZYLOPRIM) 100 MG tablet Take 1 tablet by mouth 2 (two) times daily.    . Alpha-Lipoic Acid 200 MG CAPS Two daily  0  . aspirin 81 MG tablet Take 81 mg by mouth daily.      Marland Kitchen atorvastatin (LIPITOR) 20 MG tablet Take 10 mg by mouth daily.     . Coenzyme Q10 300 MG CAPS Take by mouth.  0  . colchicine 0.6 MG tablet Take 1 tablet by mouth 2 (two) times daily as needed.    . desonide (DESOWEN) 0.05 % ointment Apply 1 application topically 2 (two) times daily as needed.    . ezetimibe (ZETIA) 10 MG tablet 1/2 tablet (5mg ) by mouth  daily -Dr Joylene Draft    . fexofenadine (ALLEGRA) 180 MG tablet Take 1 tablet by mouth daily.    . fluocinonide (LIDEX) 0.05 % external solution Apply 1 application topically as needed.     Marland Kitchen guaiFENesin (MUCINEX) 600 MG 12 hr tablet Take 1,200 mg by mouth 2 (two) times daily.    . Multiple Vitamins-Minerals (CENTRUM SILVER PO)  Take 1 tablet by mouth daily.      . Omega-3 Fatty Acids (FISH OIL CONCENTRATE PO) 4800MG  DAILY     . ramipril (ALTACE) 1.25 MG capsule Take 1 capsule (1.25 mg total) by mouth daily. 30 capsule 2  . Salicylic Acid 6 % SHAM Apply 1 applicator topically as directed.     . valACYclovir (VALTREX) 1000 MG tablet Take 1 tablet by mouth daily as needed.    . vitamin C (ASCORBIC ACID) 500 MG tablet Take 500 mg by mouth daily.      Marland Kitchen Zn-Pyg Afri-Nettle-Saw Palmet (SAW PALMETTO COMPLEX PO) Take 1 tablet by mouth daily.       No current facility-administered medications for this visit.     BP 131/87   Pulse 78   Ht 5\' 6"  (1.676 m)   Wt 163 lb (73.9 kg)   BMI 26.31 kg/m  General: NAD Neck: No JVD, no thyromegaly or thyroid nodule.  Lungs: Clear to  auscultation bilaterally with normal respiratory effort. CV: Nondisplaced PMI.  Heart regular S1/S2, soft S4, no murmur.  No peripheral edema.  No carotid bruit.  Normal pedal pulses.  Abdomen: Soft, nontender, no hepatosplenomegaly, no distention.  Neurologic: Alert and oriented x 3.  Psych: Normal affect. Extremities: No clubbing or cyanosis.   Assessment/Plan:  1. Coronary artery disease  Patient has definite coronary artery disease based on coronary calcification seen on CT chests. He really has no cardiopulmonary symptoms suggest angina.  - Continue ASA 81, ramipril, and statin. 2. Hyperlipidemia Continue statin, good lipids in 10/17.    He will followup in 1 year.  Given my transition to CHF clinic, he will see Dr. Saunders Revel.   Loralie Champagne 08/31/16

## 2016-09-13 DIAGNOSIS — J019 Acute sinusitis, unspecified: Secondary | ICD-10-CM | POA: Insufficient documentation

## 2016-09-13 DIAGNOSIS — R590 Localized enlarged lymph nodes: Secondary | ICD-10-CM | POA: Diagnosis not present

## 2016-12-11 DIAGNOSIS — Z961 Presence of intraocular lens: Secondary | ICD-10-CM | POA: Diagnosis not present

## 2016-12-11 DIAGNOSIS — H35103 Retinopathy of prematurity, unspecified, bilateral: Secondary | ICD-10-CM | POA: Diagnosis not present

## 2016-12-11 DIAGNOSIS — H33102 Unspecified retinoschisis, left eye: Secondary | ICD-10-CM | POA: Diagnosis not present

## 2016-12-11 DIAGNOSIS — H35372 Puckering of macula, left eye: Secondary | ICD-10-CM | POA: Diagnosis not present

## 2016-12-14 ENCOUNTER — Other Ambulatory Visit: Payer: Self-pay | Admitting: *Deleted

## 2016-12-14 MED ORDER — RAMIPRIL 1.25 MG PO CAPS: 1.2500 mg | ORAL_CAPSULE | Freq: Every day | ORAL | 7 refills | Status: DC

## 2017-02-22 DIAGNOSIS — Z6828 Body mass index (BMI) 28.0-28.9, adult: Secondary | ICD-10-CM | POA: Diagnosis not present

## 2017-02-22 DIAGNOSIS — J209 Acute bronchitis, unspecified: Secondary | ICD-10-CM | POA: Diagnosis not present

## 2017-04-16 DIAGNOSIS — R252 Cramp and spasm: Secondary | ICD-10-CM | POA: Diagnosis not present

## 2017-04-17 ENCOUNTER — Ambulatory Visit (INDEPENDENT_AMBULATORY_CARE_PROVIDER_SITE_OTHER): Payer: Medicare Other | Admitting: Orthopedic Surgery

## 2017-04-17 ENCOUNTER — Ambulatory Visit (INDEPENDENT_AMBULATORY_CARE_PROVIDER_SITE_OTHER): Payer: Medicare Other

## 2017-04-17 ENCOUNTER — Encounter (INDEPENDENT_AMBULATORY_CARE_PROVIDER_SITE_OTHER): Payer: Self-pay | Admitting: Orthopedic Surgery

## 2017-04-17 VITALS — BP 146/83 | HR 68 | Ht 67.0 in | Wt 175.0 lb

## 2017-04-17 DIAGNOSIS — M25561 Pain in right knee: Secondary | ICD-10-CM

## 2017-04-17 DIAGNOSIS — M25562 Pain in left knee: Secondary | ICD-10-CM | POA: Diagnosis not present

## 2017-04-17 MED ORDER — PREDNISONE 5 MG (21) PO TBPK: ORAL_TABLET | ORAL | 0 refills | Status: DC
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
## 2017-04-23 ENCOUNTER — Ambulatory Visit (INDEPENDENT_AMBULATORY_CARE_PROVIDER_SITE_OTHER): Payer: Medicare Other | Admitting: Orthopaedic Surgery

## 2017-04-23 DIAGNOSIS — M25562 Pain in left knee: Secondary | ICD-10-CM
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
## 2017-05-01 ENCOUNTER — Encounter: Payer: Self-pay | Admitting: Internal Medicine

## 2017-05-02 ENCOUNTER — Ambulatory Visit (INDEPENDENT_AMBULATORY_CARE_PROVIDER_SITE_OTHER): Payer: Medicare Other | Admitting: Internal Medicine

## 2017-05-02 ENCOUNTER — Encounter: Payer: Self-pay | Admitting: Internal Medicine

## 2017-05-02 VITALS — BP 132/74 | HR 96 | Ht 66.0 in | Wt 181.0 lb

## 2017-05-02 DIAGNOSIS — R0609 Other forms of dyspnea: Secondary | ICD-10-CM

## 2017-05-02 DIAGNOSIS — M79605 Pain in left leg: Secondary | ICD-10-CM

## 2017-05-02 DIAGNOSIS — M79604 Pain in right leg: Secondary | ICD-10-CM | POA: Diagnosis not present

## 2017-05-02 DIAGNOSIS — I2510 Atherosclerotic heart disease of native coronary artery without angina pectoris: Secondary | ICD-10-CM | POA: Diagnosis not present

## 2017-05-02 DIAGNOSIS — R0989 Other specified symptoms and signs involving the circulatory and respiratory systems: Secondary | ICD-10-CM

## 2017-05-02 MED ORDER — RAMIPRIL 1.25 MG PO CAPS: 1.2500 mg | ORAL_CAPSULE | Freq: Every day | ORAL | 2 refills | Status: DC
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
## 2017-05-02 NOTE — Patient Instructions (Signed)
Medication Instructions:  Your physician recommends that you continue on your current medications as directed. Please refer to the Current Medication list given to you today.   Labwork: none  Testing/Procedures: Your physician has requested that you have a lexiscan myoview. For further information please visit [email or web address]. Please follow instruction sheet, as given.  Your physician has requested that you have a lower extremity arterial duplex. This test is an ultrasound of the arteries in the legs . It looks at arterial blood flow in the legs. Allow one hour for Lower Arterial scans. There are no restrictions or special instructions   Follow-Up: Your physician recommends that you schedule a follow-up appointment in: 3 months with Dr End.   Any Other Special Instructions Will Be Listed Below (If Applicable).     If you need a refill on your cardiac medications before your next appointment, please call your pharmacy.

## 2017-05-03 ENCOUNTER — Other Ambulatory Visit: Payer: Self-pay | Admitting: Internal Medicine

## 2017-05-03 DIAGNOSIS — I739 Peripheral vascular disease, unspecified: Secondary | ICD-10-CM

## 2017-05-04 ENCOUNTER — Encounter: Payer: Self-pay | Admitting: Internal Medicine

## 2017-05-06 ENCOUNTER — Telehealth (HOSPITAL_COMMUNITY): Payer: Self-pay | Admitting: *Deleted

## 2017-05-06 NOTE — Telephone Encounter (Signed)
Patient given detailed instructions per Myocardial Perfusion Study Information Sheet for the test on  05/07/17 Patient notified to arrive 15 minutes early and that it is imperative to arrive on time for appointment to keep from having the test rescheduled.  If you need to cancel or reschedule your appointment, please call the office within 24 hours of your appointment. . Patient verbalized understanding. Kirstie Peri

## 2017-05-07 ENCOUNTER — Telehealth: Payer: Self-pay | Admitting: *Deleted

## 2017-05-07 ENCOUNTER — Ambulatory Visit (HOSPITAL_COMMUNITY): Payer: Medicare Other | Attending: Cardiology

## 2017-05-07 DIAGNOSIS — I2510 Atherosclerotic heart disease of native coronary artery without angina pectoris: Secondary | ICD-10-CM | POA: Insufficient documentation

## 2017-05-07 DIAGNOSIS — M79604 Pain in right leg: Secondary | ICD-10-CM | POA: Insufficient documentation

## 2017-05-07 DIAGNOSIS — R0609 Other forms of dyspnea: Secondary | ICD-10-CM | POA: Insufficient documentation

## 2017-05-07 DIAGNOSIS — R0989 Other specified symptoms and signs involving the circulatory and respiratory systems: Secondary | ICD-10-CM | POA: Diagnosis not present

## 2017-05-07 DIAGNOSIS — M79605 Pain in left leg: Secondary | ICD-10-CM | POA: Diagnosis not present

## 2017-05-07 DIAGNOSIS — R9439 Abnormal result of other cardiovascular function study: Secondary | ICD-10-CM | POA: Insufficient documentation

## 2017-05-07 LAB — MYOCARDIAL PERFUSION IMAGING
CHL CUP NUCLEAR SDS: 4
LHR: 0.25
LVDIAVOL: 92 mL (ref 62–150)
LVSYSVOL: 42 mL
NUC STRESS TID: 1.08
Peak HR: 86 {beats}/min
Rest HR: 73 {beats}/min
SRS: 8
SSS: 12

## 2017-05-07 MED ORDER — TECHNETIUM TC 99M TETROFOSMIN IV KIT
32.7000 | PACK | Freq: Once | INTRAVENOUS | Status: AC | PRN
  Administered 2017-05-07: 32.7 via INTRAVENOUS
  Filled 2017-05-07: qty 33

## 2017-05-07 MED ORDER — REGADENOSON 0.4 MG/5ML IV SOLN
0.4000 mg | Freq: Once | INTRAVENOUS | Status: AC
  Administered 2017-05-07: 0.4 mg via INTRAVENOUS

## 2017-05-07 MED ORDER — TECHNETIUM TC 99M TETROFOSMIN IV KIT
10.2000 | PACK | Freq: Once | INTRAVENOUS | Status: AC | PRN
  Administered 2017-05-07: 10.2 via INTRAVENOUS
  Filled 2017-05-07: qty 11

## 2017-05-07 NOTE — Telephone Encounter (Signed)
If Reginald Davis would like to have a carotid Doppler for screening purposes, given his history of CAD, I think that is fine. He may need to pay out of pocket, but it sounds like he is okay with that.  [name], MD [facility] Pager: [phone]

## 2017-05-07 NOTE — Telephone Encounter (Signed)
Pt aware he can have carotid doppler for screening purposes, will be done tomorrow at 4:30PM after [facility].

## 2017-05-07 NOTE — Telephone Encounter (Signed)
Pt is scheduled for lower extremity arterial doppler tomorrow. Pt  asking if he should have a carotid doppler also. Pt states he is not having any symptoms but read an article in the [employer] that suggested getting a carotid doppler, he will be glad to pay out of pocket for carotid if necessary.  Pt advised I will forward to Dr End for review.

## 2017-05-08 ENCOUNTER — Ambulatory Visit (HOSPITAL_COMMUNITY)
Admission: RE | Admit: 2017-05-08 | Discharge: 2017-05-08 | Disposition: A | Discharge status: Home / Self Care | Payer: Medicare Other | Source: Ambulatory Visit | Attending: Cardiology | Admitting: Cardiology

## 2017-05-08 ENCOUNTER — Ambulatory Visit (HOSPITAL_COMMUNITY)
Admission: RE | Admit: 2017-05-08 | Discharge: 2017-05-08 | Disposition: A | Discharge status: Home / Self Care | Payer: Self-pay | Source: Ambulatory Visit | Attending: Cardiology | Admitting: Cardiology

## 2017-05-08 DIAGNOSIS — I2510 Atherosclerotic heart disease of native coronary artery without angina pectoris: Secondary | ICD-10-CM

## 2017-05-08 DIAGNOSIS — R0989 Other specified symptoms and signs involving the circulatory and respiratory systems: Secondary | ICD-10-CM | POA: Diagnosis not present

## 2017-05-08 DIAGNOSIS — M79605 Pain in left leg: Secondary | ICD-10-CM

## 2017-05-08 DIAGNOSIS — M79604 Pain in right leg: Secondary | ICD-10-CM

## 2017-05-08 DIAGNOSIS — I739 Peripheral vascular disease, unspecified: Secondary | ICD-10-CM

## 2017-05-08 DIAGNOSIS — R0609 Other forms of dyspnea: Principal | ICD-10-CM

## 2017-05-20 ENCOUNTER — Telehealth: Payer: Self-pay | Admitting: *Deleted

## 2017-05-20 NOTE — Telephone Encounter (Signed)
-----   Message from [name], MD sent at 05/20/2017 10:32 AM EDT ----- Regarding: Vascular screening study Please let Mr. Reginald Davis know that I have reviewed his carotid screening ultrasound, which shows mild plaque buildup on both sides without significant narrowing. I recommend that he continue his current medications, including aspirin and atorvastatin. Thanks.  Gerald Stabs

## 2017-05-20 NOTE — Telephone Encounter (Signed)
Left message with results on [phone] (DPR).

## 2017-06-08 DIAGNOSIS — Z23 Encounter for immunization: Secondary | ICD-10-CM | POA: Diagnosis not present

## 2017-06-18 DIAGNOSIS — Z961 Presence of intraocular lens: Secondary | ICD-10-CM | POA: Diagnosis not present

## 2017-06-18 DIAGNOSIS — H35372 Puckering of macula, left eye: Secondary | ICD-10-CM | POA: Diagnosis not present

## 2017-06-18 DIAGNOSIS — H35103 Retinopathy of prematurity, unspecified, bilateral: Secondary | ICD-10-CM | POA: Diagnosis not present

## 2017-06-18 DIAGNOSIS — H33102 Unspecified retinoschisis, left eye: Secondary | ICD-10-CM | POA: Diagnosis not present

## 2017-07-22 ENCOUNTER — Encounter: Payer: Self-pay | Admitting: Internal Medicine

## 2017-07-22 ENCOUNTER — Ambulatory Visit (INDEPENDENT_AMBULATORY_CARE_PROVIDER_SITE_OTHER): Payer: Medicare Other | Admitting: Internal Medicine

## 2017-07-22 VITALS — BP 126/82 | HR 82 | Ht 66.0 in | Wt 185.0 lb

## 2017-07-22 DIAGNOSIS — I4439 Other atrioventricular block: Secondary | ICD-10-CM | POA: Diagnosis not present

## 2017-07-22 DIAGNOSIS — I454 Nonspecific intraventricular block: Secondary | ICD-10-CM | POA: Insufficient documentation

## 2017-07-22 DIAGNOSIS — E785 Hyperlipidemia, unspecified: Secondary | ICD-10-CM

## 2017-07-22 DIAGNOSIS — R0609 Other forms of dyspnea: Secondary | ICD-10-CM | POA: Diagnosis not present

## 2017-07-22 DIAGNOSIS — R0789 Other chest pain: Secondary | ICD-10-CM | POA: Diagnosis not present

## 2017-07-22 DIAGNOSIS — I10 Essential (primary) hypertension: Secondary | ICD-10-CM | POA: Diagnosis not present

## 2017-07-22 NOTE — Patient Instructions (Signed)
Medication Instructions:  Your physician recommends that you continue on your current medications as directed. Please refer to the Current Medication list given to you today.    Labwork: None   Testing/Procedures: None   Follow-Up: Your physician wants you to follow-up in: 6 months with Dr End (May 2019). You will receive a reminder letter in the mail two months in advance. If you don't receive a letter, please call our office to schedule the follow-up appointment.        If you need a refill on your cardiac medications before your next appointment, please call your pharmacy.
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
## 2017-08-06 DIAGNOSIS — R05 Cough: Secondary | ICD-10-CM | POA: Diagnosis not present

## 2017-08-06 DIAGNOSIS — Z6829 Body mass index (BMI) 29.0-29.9, adult: Secondary | ICD-10-CM | POA: Diagnosis not present

## 2017-08-06 DIAGNOSIS — J209 Acute bronchitis, unspecified: Secondary | ICD-10-CM | POA: Diagnosis not present

## 2017-08-06 DIAGNOSIS — J189 Pneumonia, unspecified organism: Secondary | ICD-10-CM | POA: Diagnosis not present

## 2017-08-15 DIAGNOSIS — R8290 Unspecified abnormal findings in urine: Secondary | ICD-10-CM | POA: Diagnosis not present

## 2017-08-15 DIAGNOSIS — Z125 Encounter for screening for malignant neoplasm of prostate: Secondary | ICD-10-CM | POA: Diagnosis not present

## 2017-08-15 DIAGNOSIS — E7849 Other hyperlipidemia: Secondary | ICD-10-CM | POA: Diagnosis not present

## 2017-08-15 DIAGNOSIS — M10072 Idiopathic gout, left ankle and foot: Secondary | ICD-10-CM | POA: Diagnosis not present

## 2017-08-15 DIAGNOSIS — R7301 Impaired fasting glucose: Secondary | ICD-10-CM | POA: Diagnosis not present

## 2017-08-22 DIAGNOSIS — M10072 Idiopathic gout, left ankle and foot: Secondary | ICD-10-CM | POA: Diagnosis not present

## 2017-08-22 DIAGNOSIS — E7849 Other hyperlipidemia: Secondary | ICD-10-CM | POA: Diagnosis not present

## 2017-08-22 DIAGNOSIS — D7589 Other specified diseases of blood and blood-forming organs: Secondary | ICD-10-CM | POA: Diagnosis not present

## 2017-08-22 DIAGNOSIS — Z0000 Encounter for general adult medical examination without abnormal findings: Secondary | ICD-10-CM | POA: Diagnosis not present

## 2017-08-22 DIAGNOSIS — R252 Cramp and spasm: Secondary | ICD-10-CM | POA: Diagnosis not present

## 2017-08-22 DIAGNOSIS — Z1389 Encounter for screening for other disorder: Secondary | ICD-10-CM | POA: Diagnosis not present

## 2017-08-22 DIAGNOSIS — I2583 Coronary atherosclerosis due to lipid rich plaque: Secondary | ICD-10-CM | POA: Diagnosis not present

## 2017-08-22 DIAGNOSIS — R918 Other nonspecific abnormal finding of lung field: Secondary | ICD-10-CM | POA: Diagnosis not present

## 2017-08-22 DIAGNOSIS — J189 Pneumonia, unspecified organism: Secondary | ICD-10-CM | POA: Diagnosis not present

## 2017-08-22 DIAGNOSIS — K4090 Unilateral inguinal hernia, without obstruction or gangrene, not specified as recurrent: Secondary | ICD-10-CM | POA: Diagnosis not present

## 2017-08-22 DIAGNOSIS — R05 Cough: Secondary | ICD-10-CM | POA: Diagnosis not present

## 2017-08-22 DIAGNOSIS — Z6829 Body mass index (BMI) 29.0-29.9, adult: Secondary | ICD-10-CM | POA: Diagnosis not present

## 2017-08-23 DIAGNOSIS — L821 Other seborrheic keratosis: Secondary | ICD-10-CM | POA: Diagnosis not present

## 2017-08-23 DIAGNOSIS — L309 Dermatitis, unspecified: Secondary | ICD-10-CM | POA: Diagnosis not present

## 2017-08-23 DIAGNOSIS — Z1212 Encounter for screening for malignant neoplasm of rectum: Secondary | ICD-10-CM | POA: Diagnosis not present

## 2017-08-23 DIAGNOSIS — D1801 Hemangioma of skin and subcutaneous tissue: Secondary | ICD-10-CM | POA: Diagnosis not present

## 2017-08-23 DIAGNOSIS — D2271 Melanocytic nevi of right lower limb, including hip: Secondary | ICD-10-CM | POA: Diagnosis not present

## 2017-08-23 DIAGNOSIS — D2272 Melanocytic nevi of left lower limb, including hip: Secondary | ICD-10-CM | POA: Diagnosis not present

## 2017-08-26 ENCOUNTER — Other Ambulatory Visit: Payer: Self-pay | Admitting: Internal Medicine

## 2017-08-26 DIAGNOSIS — R918 Other nonspecific abnormal finding of lung field: Secondary | ICD-10-CM

## 2017-09-20 ENCOUNTER — Ambulatory Visit
Admission: RE | Admit: 2017-09-20 | Discharge: 2017-09-20 | Disposition: A | Discharge status: Home / Self Care | Payer: Medicare Other | Source: Ambulatory Visit | Attending: Internal Medicine | Admitting: Internal Medicine

## 2017-09-20 DIAGNOSIS — R911 Solitary pulmonary nodule: Secondary | ICD-10-CM | POA: Diagnosis not present

## 2017-09-20 DIAGNOSIS — R918 Other nonspecific abnormal finding of lung field: Secondary | ICD-10-CM

## 2017-09-26 DIAGNOSIS — I2583 Coronary atherosclerosis due to lipid rich plaque: Secondary | ICD-10-CM | POA: Diagnosis not present

## 2017-09-26 DIAGNOSIS — R7301 Impaired fasting glucose: Secondary | ICD-10-CM | POA: Diagnosis not present

## 2017-09-26 DIAGNOSIS — Z6829 Body mass index (BMI) 29.0-29.9, adult: Secondary | ICD-10-CM | POA: Diagnosis not present

## 2017-09-26 DIAGNOSIS — M10072 Idiopathic gout, left ankle and foot: Secondary | ICD-10-CM | POA: Diagnosis not present

## 2017-10-16 DIAGNOSIS — R972 Elevated prostate specific antigen [PSA]: Secondary | ICD-10-CM | POA: Diagnosis not present

## 2017-10-16 DIAGNOSIS — N400 Enlarged prostate without lower urinary tract symptoms: Secondary | ICD-10-CM | POA: Diagnosis not present

## 2017-10-16 DIAGNOSIS — N4341 Spermatocele of epididymis, single: Secondary | ICD-10-CM | POA: Diagnosis not present

## 2017-12-03 DIAGNOSIS — H9202 Otalgia, left ear: Secondary | ICD-10-CM | POA: Diagnosis not present

## 2017-12-17 DIAGNOSIS — H35103 Retinopathy of prematurity, unspecified, bilateral: Secondary | ICD-10-CM | POA: Diagnosis not present

## 2017-12-17 DIAGNOSIS — H35372 Puckering of macula, left eye: Secondary | ICD-10-CM | POA: Diagnosis not present

## 2017-12-17 DIAGNOSIS — H33102 Unspecified retinoschisis, left eye: Secondary | ICD-10-CM | POA: Diagnosis not present

## 2017-12-23 DIAGNOSIS — R7301 Impaired fasting glucose: Secondary | ICD-10-CM | POA: Diagnosis not present

## 2018-01-13 ENCOUNTER — Encounter: Payer: Self-pay | Admitting: Internal Medicine

## 2018-01-13 ENCOUNTER — Ambulatory Visit (INDEPENDENT_AMBULATORY_CARE_PROVIDER_SITE_OTHER): Payer: Medicare Other | Admitting: Internal Medicine

## 2018-01-13 VITALS — BP 130/82 | HR 77 | Ht 66.0 in | Wt 180.2 lb

## 2018-01-13 DIAGNOSIS — I10 Essential (primary) hypertension: Secondary | ICD-10-CM | POA: Diagnosis not present

## 2018-01-13 DIAGNOSIS — E785 Hyperlipidemia, unspecified: Secondary | ICD-10-CM

## 2018-01-13 DIAGNOSIS — I2510 Atherosclerotic heart disease of native coronary artery without angina pectoris: Secondary | ICD-10-CM | POA: Diagnosis not present

## 2018-01-13 NOTE — Patient Instructions (Addendum)
Medication Instructions:  Your physician recommends that you continue on your current medications as directed. Please refer to the Current Medication list given to you today.  -- If you need a refill on your cardiac medications before your next appointment, please call your pharmacy. --  Labwork: None ordered  Testing/Procedures: None ordered  Follow-Up: Your physician wants you to follow-up in: 6 months with Dr. End.    You will receive a reminder letter in the mail two months in advance. If you don't receive a letter, please call our office to schedule the follow-up appointment.  Thank you for choosing [facility]!!    Any Other Special Instructions Will Be Listed Below (If Applicable).         
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
## 2018-02-20 ENCOUNTER — Other Ambulatory Visit: Payer: Self-pay | Admitting: Internal Medicine

## 2018-02-20 DIAGNOSIS — I2510 Atherosclerotic heart disease of native coronary artery without angina pectoris: Secondary | ICD-10-CM

## 2018-02-20 DIAGNOSIS — R0989 Other specified symptoms and signs involving the circulatory and respiratory systems: Secondary | ICD-10-CM

## 2018-02-20 DIAGNOSIS — M79604 Pain in right leg: Secondary | ICD-10-CM

## 2018-02-20 DIAGNOSIS — R0609 Other forms of dyspnea: Principal | ICD-10-CM

## 2018-02-20 DIAGNOSIS — M79605 Pain in left leg: Secondary | ICD-10-CM

## 2018-02-21 NOTE — Telephone Encounter (Signed)
Please review for refill, Thanks !  

## 2018-03-27 DIAGNOSIS — L2089 Other atopic dermatitis: Secondary | ICD-10-CM | POA: Diagnosis not present

## 2018-03-27 DIAGNOSIS — S80861S Insect bite (nonvenomous), right lower leg, sequela: Secondary | ICD-10-CM | POA: Diagnosis not present

## 2018-04-01 ENCOUNTER — Encounter: Payer: Self-pay | Admitting: Internal Medicine

## 2018-05-07 DIAGNOSIS — H53001 Unspecified amblyopia, right eye: Secondary | ICD-10-CM | POA: Diagnosis not present

## 2018-05-07 DIAGNOSIS — H52222 Regular astigmatism, left eye: Secondary | ICD-10-CM | POA: Diagnosis not present

## 2018-05-07 DIAGNOSIS — H2703 Aphakia, bilateral: Secondary | ICD-10-CM | POA: Diagnosis not present

## 2018-05-07 DIAGNOSIS — H33302 Unspecified retinal break, left eye: Secondary | ICD-10-CM | POA: Diagnosis not present

## 2018-05-07 DIAGNOSIS — H59811 Chorioretinal scars after surgery for detachment, right eye: Secondary | ICD-10-CM | POA: Diagnosis not present

## 2018-05-07 DIAGNOSIS — H5212 Myopia, left eye: Secondary | ICD-10-CM | POA: Diagnosis not present

## 2018-06-07 DIAGNOSIS — Z23 Encounter for immunization: Secondary | ICD-10-CM | POA: Diagnosis not present

## 2018-06-17 DIAGNOSIS — H35372 Puckering of macula, left eye: Secondary | ICD-10-CM | POA: Diagnosis not present

## 2018-06-17 DIAGNOSIS — H35103 Retinopathy of prematurity, unspecified, bilateral: Secondary | ICD-10-CM | POA: Diagnosis not present

## 2018-06-17 DIAGNOSIS — H33102 Unspecified retinoschisis, left eye: Secondary | ICD-10-CM | POA: Diagnosis not present

## 2018-07-18 ENCOUNTER — Ambulatory Visit: Payer: Medicare Other | Admitting: Internal Medicine

## 2018-07-28 ENCOUNTER — Telehealth: Payer: Self-pay

## 2018-07-28 ENCOUNTER — Ambulatory Visit (INDEPENDENT_AMBULATORY_CARE_PROVIDER_SITE_OTHER): Payer: Medicare Other | Admitting: Internal Medicine

## 2018-07-28 ENCOUNTER — Other Ambulatory Visit: Payer: Self-pay | Admitting: Internal Medicine

## 2018-07-28 ENCOUNTER — Encounter: Payer: Self-pay | Admitting: Internal Medicine

## 2018-07-28 VITALS — BP 144/84 | HR 78 | Ht 66.0 in | Wt 181.8 lb

## 2018-07-28 DIAGNOSIS — I10 Essential (primary) hypertension: Secondary | ICD-10-CM | POA: Diagnosis not present

## 2018-07-28 DIAGNOSIS — I4439 Other atrioventricular block: Secondary | ICD-10-CM | POA: Diagnosis not present

## 2018-07-28 DIAGNOSIS — I499 Cardiac arrhythmia, unspecified: Secondary | ICD-10-CM | POA: Diagnosis not present

## 2018-07-28 DIAGNOSIS — I25119 Atherosclerotic heart disease of native coronary artery with unspecified angina pectoris: Secondary | ICD-10-CM | POA: Diagnosis not present

## 2018-07-28 DIAGNOSIS — I2510 Atherosclerotic heart disease of native coronary artery without angina pectoris: Secondary | ICD-10-CM | POA: Diagnosis not present

## 2018-07-28 DIAGNOSIS — I209 Angina pectoris, unspecified: Secondary | ICD-10-CM | POA: Diagnosis not present

## 2018-07-28 DIAGNOSIS — I447 Left bundle-branch block, unspecified: Secondary | ICD-10-CM | POA: Diagnosis not present

## 2018-07-28 LAB — BASIC METABOLIC PANEL WITH GFR
BUN/Creatinine Ratio: 21 (ref 10–24)
BUN: 18 mg/dL (ref 8–27)
CO2: 22 mmol/L (ref 20–29)
Calcium: 9.2 mg/dL (ref 8.6–10.2)
Chloride: 101 mmol/L (ref 96–106)
Creatinine, Ser: 0.87 mg/dL (ref 0.76–1.27)
GFR calc Af Amer: 100 mL/min/1.73
GFR calc non Af Amer: 86 mL/min/1.73
Glucose: 105 mg/dL — ABNORMAL HIGH (ref 65–99)
Potassium: 4.4 mmol/L (ref 3.5–5.2)
Sodium: 138 mmol/L (ref 134–144)

## 2018-07-28 MED ORDER — RAMIPRIL 2.5 MG PO CAPS: 2.5000 mg | ORAL_CAPSULE | Freq: Every day | ORAL | 3 refills | Status: DC

## 2018-07-28 MED ORDER — METOPROLOL TARTRATE 50 MG PO TABS: 50.0000 mg | ORAL_TABLET | Freq: Once | ORAL | 0 refills | Status: DC
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
## 2018-07-28 NOTE — Telephone Encounter (Signed)
Please review for refill. Thanks!  

## 2018-07-28 NOTE — Patient Instructions (Signed)
Medication Instructions:  INCREASE RAMIPRIL to 2.5 mg daily If you need a refill on your cardiac medications before your next appointment, please call your pharmacy.   Lab work:TODAY BMET  BMET in 2 weeks If you have labs (blood work) drawn today and your tests are completely normal, you will receive your results only by: Marland Kitchen MyChart Message (if you have MyChart) OR . A paper copy in the mail If you have any lab test that is abnormal or we need to change your treatment, we will call you to review the results.  Testing/Procedures:PLEASE SCHEDULE  CARDIAC CTA  Your physician has recommended that you wear a 48 hour holter monitor. Holter monitors are medical devices that record the heart's electrical activity. Doctors most often use these monitors to diagnose arrhythmias. Arrhythmias are problems with the speed or rhythm of the heartbeat. The monitor is a small, portable device. You can wear one while you do your normal daily activities. This is usually used to diagnose what is causing palpitations/syncope (passing out).  Please arrive at the [facility] main entrance of [facility] at _____ AM (30-45 minutes prior to test start time)  [facility] [place], [place] [address] [phone]  Proceed to the [facility] Radiology Department (First Floor).  Please follow these instructions carefully (unless otherwise directed):  Hold all erectile dysfunction medications at least 48 hours prior to test.  On the Night Before the Test: . Be sure to Drink plenty of water. . Do not consume any caffeinated/decaffeinated beverages or chocolate 12 hours prior to your test. . Do not take any antihistamines 12 hours prior to your test. On the Day of the Test: . Drink plenty of water. Do not drink any water within one hour of the test. . Do not eat any food 4 hours prior to the test. . You may take your regular medications prior to the test.  . Take metoprolol  (Lopressor) two hours prior to test. . HOLD Furosemide/Hydrochlorothiazide morning of the test.   *For Clinical Staff only. Please instruct patient the following:*        -Drink plenty of water       -Hold Furosemide/hydrochlorothiazide morning of the test       -Take metoprolol (Lopressor) 2 hours prior to test (if applicable).                  -If HR is less than 55 BPM- No Beta Blocker                -IF HR is greater than 55 BPM and patient is less than or equal to 49 yrs old Lopressor 100mg  x1.                -If HR is greater than 55 BPM and patient is greater than 67 yrs old Lopressor 50 mg x1.     Do not give Lopressor to patients with an allergy to lopressor or anyone with asthma or active COPD symptoms (currently taking steroids).       After the Test: . Drink plenty of water. . After receiving IV contrast, you may experience a mild flushed feeling. This is normal. . On occasion, you may experience a mild rash up to 24 hours after the test. This is not dangerous. If this occurs, you can take Benadryl 25 mg and increase your fluid intake. . If you experience trouble breathing, this can be serious. If it is severe call  911 IMMEDIATELY. If it is mild, please call our office.   Follow-Up: At [facility], you and your health needs are our priority.  As part of our continuing mission to provide you with exceptional heart care, we have created designated Provider Care Teams.  These Care Teams include your primary Cardiologist (physician) and Advanced Practice Providers (APPs -  Physician Assistants and Nurse Practitioners) who all work together to provide you with the care you need, when you need it. You will need a follow up appointment in 3 months.  Please call our office 2 months in advance to schedule this appointment.  You may see Dr Meda Coffee or one of the following Advanced Practice Providers on your designated Care Team:   [place], PA-C Melina Copa, PA-C . Ermalinda Barrios, PA-C  Any Other Special Instructions Will Be Listed Below (If Applicable).

## 2018-07-28 NOTE — Telephone Encounter (Signed)
Spoke to patient and informed him to take the Lopressor 50 mg 2 hours prior to Cardiac CT.

## 2018-07-29 ENCOUNTER — Other Ambulatory Visit: Payer: Self-pay | Admitting: Cardiology

## 2018-07-29 ENCOUNTER — Ambulatory Visit (INDEPENDENT_AMBULATORY_CARE_PROVIDER_SITE_OTHER): Payer: Medicare Other

## 2018-07-29 DIAGNOSIS — I499 Cardiac arrhythmia, unspecified: Secondary | ICD-10-CM | POA: Diagnosis not present

## 2018-07-29 DIAGNOSIS — R001 Bradycardia, unspecified: Secondary | ICD-10-CM

## 2018-07-29 DIAGNOSIS — I447 Left bundle-branch block, unspecified: Secondary | ICD-10-CM

## 2018-07-29 DIAGNOSIS — R0609 Other forms of dyspnea: Secondary | ICD-10-CM

## 2018-08-12 ENCOUNTER — Other Ambulatory Visit: Payer: Medicare Other

## 2018-08-12 DIAGNOSIS — I447 Left bundle-branch block, unspecified: Secondary | ICD-10-CM | POA: Diagnosis not present

## 2018-08-12 DIAGNOSIS — I10 Essential (primary) hypertension: Secondary | ICD-10-CM

## 2018-08-12 DIAGNOSIS — I25119 Atherosclerotic heart disease of native coronary artery with unspecified angina pectoris: Secondary | ICD-10-CM

## 2018-08-12 DIAGNOSIS — I499 Cardiac arrhythmia, unspecified: Secondary | ICD-10-CM

## 2018-08-12 LAB — BASIC METABOLIC PANEL
BUN / CREAT RATIO: 19 (ref 10–24)
BUN: 15 mg/dL (ref 8–27)
CALCIUM: 9.3 mg/dL (ref 8.6–10.2)
CO2: 21 mmol/L (ref 20–29)
Chloride: 102 mmol/L (ref 96–106)
Creatinine, Ser: 0.79 mg/dL (ref 0.76–1.27)
GFR calc non Af Amer: 90 mL/min/{1.73_m2} (ref >59)
GFR, EST AFRICAN AMERICAN: 104 mL/min/{1.73_m2} (ref >59)
Glucose: 99 mg/dL (ref 65–99)
Potassium: 4.6 mmol/L (ref 3.5–5.2)
Sodium: 139 mmol/L (ref 134–144)

## 2018-08-13 DIAGNOSIS — R001 Bradycardia, unspecified: Secondary | ICD-10-CM | POA: Diagnosis not present

## 2018-08-13 DIAGNOSIS — I447 Left bundle-branch block, unspecified: Secondary | ICD-10-CM | POA: Diagnosis not present

## 2018-08-13 DIAGNOSIS — I499 Cardiac arrhythmia, unspecified: Secondary | ICD-10-CM | POA: Diagnosis not present

## 2018-08-14 DIAGNOSIS — D2272 Melanocytic nevi of left lower limb, including hip: Secondary | ICD-10-CM | POA: Diagnosis not present

## 2018-08-14 DIAGNOSIS — L821 Other seborrheic keratosis: Secondary | ICD-10-CM | POA: Diagnosis not present

## 2018-08-14 DIAGNOSIS — D225 Melanocytic nevi of trunk: Secondary | ICD-10-CM | POA: Diagnosis not present

## 2018-08-14 DIAGNOSIS — D1801 Hemangioma of skin and subcutaneous tissue: Secondary | ICD-10-CM | POA: Diagnosis not present

## 2018-08-14 DIAGNOSIS — L812 Freckles: Secondary | ICD-10-CM | POA: Diagnosis not present

## 2018-08-14 DIAGNOSIS — L853 Xerosis cutis: Secondary | ICD-10-CM | POA: Diagnosis not present

## 2018-08-14 DIAGNOSIS — L858 Other specified epidermal thickening: Secondary | ICD-10-CM | POA: Diagnosis not present

## 2018-08-14 DIAGNOSIS — D2271 Melanocytic nevi of right lower limb, including hip: Secondary | ICD-10-CM | POA: Diagnosis not present

## 2018-08-15 ENCOUNTER — Telehealth: Payer: Self-pay

## 2018-08-15 NOTE — Telephone Encounter (Signed)
-----   Message from [name], MD sent at 08/14/2018  4:43 PM EST ----- Please let Mr. Delarosa know that his event monitor shows rare extra beats and brief episodes of rapid heart beats lasting a few seconds at a time.  I suspect that the drops in heart rate while exercising are due to extra beats that are confusing his heart rate monitor (PVC's).  We could consider starting a beta-blocker to see if that helps decrease the extra beats.  However, I suggest that we defer adding this until after the cardiac CTA.

## 2018-08-15 NOTE — Telephone Encounter (Signed)
Notes recorded by Frederik Schmidt, RN on 08/15/2018 at 8:08 AM EST [email or web address] 12/6 ------

## 2018-08-15 NOTE — Telephone Encounter (Signed)
Notes recorded by Frederik Schmidt, RN on 08/15/2018 at 8:54 AM EST The patient has been notified of the result and verbalized understanding. All questions (if any) were answered. Frederik Schmidt, RN 08/15/2018 8:54 AM

## 2018-08-15 NOTE — Telephone Encounter (Signed)
-----   Message from [name], MD sent at 08/14/2018  4:43 PM EST ----- Please let Mr. Reimers know that his event monitor shows rare extra beats and brief episodes of rapid heart beats lasting a few seconds at a time.  I suspect that the drops in heart rate while exercising are due to extra beats that are confusing his heart rate monitor (PVC's).  We could consider starting a beta-blocker to see if that helps decrease the extra beats.  However, I suggest that we defer adding this until after the cardiac CTA.

## 2018-08-25 ENCOUNTER — Telehealth (HOSPITAL_COMMUNITY): Payer: Self-pay | Admitting: Emergency Medicine

## 2018-08-25 NOTE — Telephone Encounter (Signed)
RN NAV attempted to make contact prior to coronary CT scheduled for tomorrow to answer any questions/provide guidance in procurdure. Unable to reach patient, left voicemail with call back number [phone] Marchia Bond

## 2018-08-25 NOTE — Telephone Encounter (Signed)
Pt returned phone call to RN NAV; verablized understanding of tomorrows procedure To arrive 30 mins early to radiology waiting room to check in To take metoprolol 2 hr prior to scan To no eat 4 hrs prior, drink water up to 1 hr prior Doesn't take metformin, denies allergy to contrast dye. Marchia Bond [phone]

## 2018-08-26 ENCOUNTER — Encounter (HOSPITAL_COMMUNITY): Payer: Self-pay

## 2018-08-26 ENCOUNTER — Ambulatory Visit (HOSPITAL_COMMUNITY)
Admission: RE | Admit: 2018-08-26 | Discharge: 2018-08-26 | Disposition: A | Discharge status: Home / Self Care | Payer: Medicare Other | Source: Ambulatory Visit | Attending: Internal Medicine | Admitting: Internal Medicine

## 2018-08-26 ENCOUNTER — Encounter: Payer: Medicare Other | Admitting: *Deleted

## 2018-08-26 ENCOUNTER — Ambulatory Visit (HOSPITAL_COMMUNITY): Admission: RE | Admit: 2018-08-26 | Payer: Medicare Other | Source: Ambulatory Visit

## 2018-08-26 DIAGNOSIS — I25119 Atherosclerotic heart disease of native coronary artery with unspecified angina pectoris: Secondary | ICD-10-CM | POA: Insufficient documentation

## 2018-08-26 DIAGNOSIS — Z006 Encounter for examination for normal comparison and control in clinical research program: Secondary | ICD-10-CM

## 2018-08-26 DIAGNOSIS — I447 Left bundle-branch block, unspecified: Secondary | ICD-10-CM | POA: Diagnosis not present

## 2018-08-26 DIAGNOSIS — I499 Cardiac arrhythmia, unspecified: Secondary | ICD-10-CM | POA: Diagnosis not present

## 2018-08-26 MED ORDER — IOPAMIDOL (ISOVUE-370) INJECTION 76%
100.0000 mL | Freq: Once | INTRAVENOUS | Status: AC | PRN
  Administered 2018-08-26: 80 mL via INTRAVENOUS

## 2018-08-26 MED ORDER — NITROGLYCERIN 0.4 MG SL SUBL
SUBLINGUAL_TABLET | SUBLINGUAL | Status: AC
  Filled 2018-08-26: qty 2

## 2018-08-26 MED ORDER — METOPROLOL TARTRATE 5 MG/5ML IV SOLN
INTRAVENOUS | Status: AC
  Filled 2018-08-26: qty 5

## 2018-08-26 MED ORDER — NITROGLYCERIN 0.4 MG SL SUBL
0.8000 mg | SUBLINGUAL_TABLET | Freq: Once | SUBLINGUAL | Status: AC
  Administered 2018-08-26: 0.8 mg via SUBLINGUAL
  Filled 2018-08-26: qty 25

## 2018-08-26 NOTE — Research (Signed)
Subject Name: Reginald Davis  Subject met inclusion and exclusion criteria.  The informed consent form, study requirements and expectations were reviewed with the subject and questions and concerns were addressed prior to the signing of the consent form.  The subject verbalized understanding of the trial requirements.  The subject agreed to participate in the [email or web address] trial and signed the informed consent  on 08/26/2018.  The informed consent was obtained prior to performance of any protocol-specific procedures for the subject.  A copy of the signed informed consent was given to the subject and a copy was placed in the subject's medical record.   [name]

## 2018-08-27 ENCOUNTER — Telehealth: Payer: Self-pay

## 2018-08-27 NOTE — Telephone Encounter (Signed)
-----   Message from [name], MD sent at 08/26/2018  7:33 PM EST ----- Please let Mr. Reginald Davis know that his cardiac CTA shows mild to moderate narrow of his coronary arteries.  There are no severe stenoses.  I recommend continued medical therapy and regular exercise to help prevent progression of coronary artery disease.  He should follow-up as planned with Dr. Meda Coffee.

## 2018-08-27 NOTE — Telephone Encounter (Signed)
Notes recorded by Frederik Schmidt, RN on 08/27/2018 at 8:04 AM EST The patient has been notified of the result and verbalized understanding. All questions (if any) were answered. Frederik Schmidt, RN 08/27/2018 8:04 AM

## 2018-10-10 DIAGNOSIS — E7849 Other hyperlipidemia: Secondary | ICD-10-CM | POA: Diagnosis not present

## 2018-10-10 DIAGNOSIS — M10072 Idiopathic gout, left ankle and foot: Secondary | ICD-10-CM | POA: Diagnosis not present

## 2018-10-10 DIAGNOSIS — R82998 Other abnormal findings in urine: Secondary | ICD-10-CM | POA: Diagnosis not present

## 2018-10-10 DIAGNOSIS — R7301 Impaired fasting glucose: Secondary | ICD-10-CM | POA: Diagnosis not present

## 2018-10-10 DIAGNOSIS — Z125 Encounter for screening for malignant neoplasm of prostate: Secondary | ICD-10-CM | POA: Diagnosis not present

## 2018-10-10 DIAGNOSIS — D7589 Other specified diseases of blood and blood-forming organs: Secondary | ICD-10-CM | POA: Diagnosis not present

## 2018-10-13 ENCOUNTER — Ambulatory Visit (INDEPENDENT_AMBULATORY_CARE_PROVIDER_SITE_OTHER): Payer: Medicare Other | Admitting: Orthopaedic Surgery

## 2018-10-13 ENCOUNTER — Ambulatory Visit (INDEPENDENT_AMBULATORY_CARE_PROVIDER_SITE_OTHER): Payer: Medicare Other

## 2018-10-13 ENCOUNTER — Encounter (INDEPENDENT_AMBULATORY_CARE_PROVIDER_SITE_OTHER): Payer: Self-pay | Admitting: Orthopaedic Surgery

## 2018-10-13 DIAGNOSIS — G8929 Other chronic pain: Secondary | ICD-10-CM

## 2018-10-13 DIAGNOSIS — M25561 Pain in right knee: Secondary | ICD-10-CM
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
## 2018-10-17 DIAGNOSIS — Z6829 Body mass index (BMI) 29.0-29.9, adult: Secondary | ICD-10-CM | POA: Diagnosis not present

## 2018-10-17 DIAGNOSIS — D7589 Other specified diseases of blood and blood-forming organs: Secondary | ICD-10-CM | POA: Diagnosis not present

## 2018-10-17 DIAGNOSIS — E7849 Other hyperlipidemia: Secondary | ICD-10-CM | POA: Diagnosis not present

## 2018-10-17 DIAGNOSIS — R7301 Impaired fasting glucose: Secondary | ICD-10-CM | POA: Diagnosis not present

## 2018-10-17 DIAGNOSIS — Z1339 Encounter for screening examination for other mental health and behavioral disorders: Secondary | ICD-10-CM | POA: Diagnosis not present

## 2018-10-17 DIAGNOSIS — Z0000 Encounter for general adult medical examination without abnormal findings: Secondary | ICD-10-CM | POA: Diagnosis not present

## 2018-10-17 DIAGNOSIS — Z1331 Encounter for screening for depression: Secondary | ICD-10-CM | POA: Diagnosis not present

## 2018-10-17 DIAGNOSIS — R972 Elevated prostate specific antigen [PSA]: Secondary | ICD-10-CM | POA: Diagnosis not present

## 2018-10-17 DIAGNOSIS — I2583 Coronary atherosclerosis due to lipid rich plaque: Secondary | ICD-10-CM | POA: Diagnosis not present

## 2018-10-17 DIAGNOSIS — K4090 Unilateral inguinal hernia, without obstruction or gangrene, not specified as recurrent: Secondary | ICD-10-CM | POA: Diagnosis not present

## 2018-10-17 DIAGNOSIS — M10072 Idiopathic gout, left ankle and foot: Secondary | ICD-10-CM | POA: Diagnosis not present

## 2018-10-17 DIAGNOSIS — R252 Cramp and spasm: Secondary | ICD-10-CM | POA: Diagnosis not present

## 2018-10-20 ENCOUNTER — Encounter (INDEPENDENT_AMBULATORY_CARE_PROVIDER_SITE_OTHER): Payer: Self-pay | Admitting: Orthopaedic Surgery

## 2018-10-20 NOTE — Telephone Encounter (Signed)
Please schedule PT for LE strengthening for chondromalacia patella

## 2018-10-22 DIAGNOSIS — Z1212 Encounter for screening for malignant neoplasm of rectum: Secondary | ICD-10-CM | POA: Diagnosis not present

## 2018-10-24 ENCOUNTER — Ambulatory Visit (INDEPENDENT_AMBULATORY_CARE_PROVIDER_SITE_OTHER): Payer: Medicare Other | Admitting: Cardiology

## 2018-10-24 ENCOUNTER — Other Ambulatory Visit (INDEPENDENT_AMBULATORY_CARE_PROVIDER_SITE_OTHER): Payer: Self-pay | Admitting: Orthopaedic Surgery

## 2018-10-24 ENCOUNTER — Encounter: Payer: Self-pay | Admitting: Cardiology

## 2018-10-24 VITALS — BP 126/76 | HR 76 | Ht 66.0 in | Wt 186.4 lb

## 2018-10-24 DIAGNOSIS — N401 Enlarged prostate with lower urinary tract symptoms: Secondary | ICD-10-CM | POA: Diagnosis not present

## 2018-10-24 DIAGNOSIS — E785 Hyperlipidemia, unspecified: Secondary | ICD-10-CM

## 2018-10-24 DIAGNOSIS — I10 Essential (primary) hypertension: Secondary | ICD-10-CM

## 2018-10-24 DIAGNOSIS — Z789 Other specified health status: Secondary | ICD-10-CM

## 2018-10-24 DIAGNOSIS — I25119 Atherosclerotic heart disease of native coronary artery with unspecified angina pectoris: Secondary | ICD-10-CM

## 2018-10-24 DIAGNOSIS — I499 Cardiac arrhythmia, unspecified: Secondary | ICD-10-CM | POA: Diagnosis not present

## 2018-10-24 DIAGNOSIS — M25561 Pain in right knee: Principal | ICD-10-CM

## 2018-10-24 DIAGNOSIS — I447 Left bundle-branch block, unspecified: Secondary | ICD-10-CM | POA: Diagnosis not present

## 2018-10-24 DIAGNOSIS — G8929 Other chronic pain: Secondary | ICD-10-CM

## 2018-10-24 DIAGNOSIS — R351 Nocturia: Secondary | ICD-10-CM | POA: Diagnosis not present

## 2018-10-24 DIAGNOSIS — R972 Elevated prostate specific antigen [PSA]: Secondary | ICD-10-CM | POA: Diagnosis not present

## 2018-10-24 NOTE — Patient Instructions (Signed)
Medication Instructions:  Your physician recommends that you continue on your current medications as directed. Please refer to the Current Medication list given to you today.  If you need a refill on your cardiac medications before your next appointment, please call your pharmacy.   Lab work: None Ordered  If you have labs (blood work) drawn today and your tests are completely normal, you will receive your results only by: . MyChart Message (if you have MyChart) OR . A paper copy in the mail If you have any lab test that is abnormal or we need to change your treatment, we will call you to review the results.  Testing/Procedures: None ordered  Follow-Up: At [facility], you and your health needs are our priority.  As part of our continuing mission to provide you with exceptional heart care, we have created designated Provider Care Teams.  These Care Teams include your primary Cardiologist (physician) and Advanced Practice Providers (APPs -  Physician Assistants and Nurse Practitioners) who all work together to provide you with the care you need, when you need it. . You will need a follow up appointment in 6 months.  Please call our office 2 months in advance to schedule this appointment.  You may see Katarina Nelson, MD or one of the following Advanced Practice Providers on your designated Care Team:   . Brittainy Simmons, PA-C . Dayna Dunn, PA-C . Michele Lenze, PA-C  Any Other Special Instructions Will Be Listed Below (If Applicable).    
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
## 2018-11-04 ENCOUNTER — Ambulatory Visit: Payer: Medicare Other | Attending: Orthopaedic Surgery

## 2018-11-04 ENCOUNTER — Other Ambulatory Visit: Payer: Self-pay

## 2018-11-04 DIAGNOSIS — R262 Difficulty in walking, not elsewhere classified: Secondary | ICD-10-CM | POA: Diagnosis not present

## 2018-11-04 DIAGNOSIS — M6281 Muscle weakness (generalized): Secondary | ICD-10-CM | POA: Insufficient documentation

## 2018-11-04 DIAGNOSIS — G8929 Other chronic pain: Secondary | ICD-10-CM | POA: Insufficient documentation

## 2018-11-04 DIAGNOSIS — M25661 Stiffness of right knee, not elsewhere classified: Secondary | ICD-10-CM | POA: Insufficient documentation

## 2018-11-04 DIAGNOSIS — M25561 Pain in right knee: Secondary | ICD-10-CM | POA: Diagnosis not present
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
## 2018-11-06 ENCOUNTER — Ambulatory Visit: Payer: Medicare Other

## 2018-11-06 DIAGNOSIS — G8929 Other chronic pain: Secondary | ICD-10-CM

## 2018-11-06 DIAGNOSIS — M25561 Pain in right knee: Secondary | ICD-10-CM | POA: Diagnosis not present

## 2018-11-06 DIAGNOSIS — M6281 Muscle weakness (generalized): Secondary | ICD-10-CM | POA: Diagnosis not present

## 2018-11-06 DIAGNOSIS — M25661 Stiffness of right knee, not elsewhere classified: Secondary | ICD-10-CM

## 2018-11-06 DIAGNOSIS — R262 Difficulty in walking, not elsewhere classified: Secondary | ICD-10-CM

## 2018-11-06 NOTE — Patient Instructions (Addendum)
Straight Leg Raise    Tighten stomach and slowly raise locked right leg ____ inches from floor. Repeat _10-20___ times per set. Do 1-2____ sets per session. Do _2Clam    Lie on side, legs bent 90. Open top knee to ceiling, rotating leg outward. Touch toes to ankle of bottom leg. Close knees, rotating leg inward. Maintain hip position. Repeat _10-20___ times. Repeat on other side. Do __2Hip Abduction: Modified    Lying on right side with pillow between thighs, raise top leg from pillow, rotating slightly out. Repeat __10-20__ times per set. Do __1-2__ sets per session. Do _2___ sessions per day.  http://orth.exer.us/704   Copyright  VHI. All rights reserved.  __ sessions per day.  http://pm.exer.us/68   Copyright  VHI. All rights reserved.  ___ sessions per day.  http://orth.exer.us/1102   Copyright  VHI. All rights reserved.  Step downs lateral x 10-20 reps 1x/day RT
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
## 2018-11-11 ENCOUNTER — Ambulatory Visit: Payer: Medicare Other | Attending: Orthopaedic Surgery

## 2018-11-11 DIAGNOSIS — G8929 Other chronic pain: Secondary | ICD-10-CM

## 2018-11-11 DIAGNOSIS — M25661 Stiffness of right knee, not elsewhere classified: Secondary | ICD-10-CM | POA: Insufficient documentation

## 2018-11-11 DIAGNOSIS — M6281 Muscle weakness (generalized): Secondary | ICD-10-CM | POA: Insufficient documentation

## 2018-11-11 DIAGNOSIS — R262 Difficulty in walking, not elsewhere classified: Secondary | ICD-10-CM | POA: Insufficient documentation

## 2018-11-11 DIAGNOSIS — M25561 Pain in right knee: Secondary | ICD-10-CM | POA: Diagnosis not present

## 2018-11-11 NOTE — Patient Instructions (Signed)
prone quad stretch, hip IR /ER , 30 sec x 2-3 reps 2x/day.    REd band for side ER , SL bridge  Both x 10-15 repos
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
## 2018-11-18 ENCOUNTER — Ambulatory Visit: Payer: Medicare Other

## 2018-11-18 DIAGNOSIS — M6281 Muscle weakness (generalized): Secondary | ICD-10-CM

## 2018-11-18 DIAGNOSIS — M25561 Pain in right knee: Secondary | ICD-10-CM | POA: Diagnosis not present

## 2018-11-18 DIAGNOSIS — M25661 Stiffness of right knee, not elsewhere classified: Secondary | ICD-10-CM | POA: Diagnosis not present

## 2018-11-18 DIAGNOSIS — R262 Difficulty in walking, not elsewhere classified: Secondary | ICD-10-CM

## 2018-11-18 DIAGNOSIS — G8929 Other chronic pain: Secondary | ICD-10-CM | POA: Diagnosis not present
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
## 2018-11-18 NOTE — Patient Instructions (Signed)
Hip stretching 2-3 reps 2x day ,30 sec or more,  Flexor, ITB , quads

## 2018-11-20 ENCOUNTER — Encounter: Payer: Medicare Other | Admitting: Physical Therapy

## 2018-11-25 ENCOUNTER — Ambulatory Visit: Payer: Medicare Other

## 2019-03-03 DIAGNOSIS — R7301 Impaired fasting glucose: Secondary | ICD-10-CM | POA: Diagnosis not present

## 2019-03-03 DIAGNOSIS — M109 Gout, unspecified: Secondary | ICD-10-CM | POA: Diagnosis not present

## 2019-03-03 DIAGNOSIS — E7849 Other hyperlipidemia: Secondary | ICD-10-CM | POA: Diagnosis not present

## 2019-03-06 DIAGNOSIS — L84 Corns and callosities: Secondary | ICD-10-CM | POA: Diagnosis not present

## 2019-03-06 DIAGNOSIS — M79671 Pain in right foot: Secondary | ICD-10-CM | POA: Diagnosis not present

## 2019-03-17 DIAGNOSIS — H1820 Unspecified corneal edema: Secondary | ICD-10-CM | POA: Diagnosis not present

## 2019-03-17 DIAGNOSIS — H1131 Conjunctival hemorrhage, right eye: Secondary | ICD-10-CM | POA: Diagnosis not present

## 2019-03-17 DIAGNOSIS — H16141 Punctate keratitis, right eye: Secondary | ICD-10-CM | POA: Diagnosis not present

## 2019-03-19 DIAGNOSIS — H52222 Regular astigmatism, left eye: Secondary | ICD-10-CM | POA: Diagnosis not present

## 2019-03-19 DIAGNOSIS — H1131 Conjunctival hemorrhage, right eye: Secondary | ICD-10-CM | POA: Diagnosis not present

## 2019-03-19 DIAGNOSIS — H1820 Unspecified corneal edema: Secondary | ICD-10-CM | POA: Diagnosis not present

## 2019-03-19 DIAGNOSIS — H5212 Myopia, left eye: Secondary | ICD-10-CM | POA: Diagnosis not present

## 2019-03-19 DIAGNOSIS — H16141 Punctate keratitis, right eye: Secondary | ICD-10-CM | POA: Diagnosis not present

## 2019-03-27 DIAGNOSIS — H5212 Myopia, left eye: Secondary | ICD-10-CM | POA: Diagnosis not present

## 2019-03-27 DIAGNOSIS — H52222 Regular astigmatism, left eye: Secondary | ICD-10-CM | POA: Diagnosis not present

## 2019-03-27 DIAGNOSIS — H04203 Unspecified epiphora, bilateral lacrimal glands: Secondary | ICD-10-CM | POA: Diagnosis not present

## 2019-03-27 DIAGNOSIS — H15111 Episcleritis periodica fugax, right eye: Secondary | ICD-10-CM | POA: Diagnosis not present

## 2019-03-31 DIAGNOSIS — H33102 Unspecified retinoschisis, left eye: Secondary | ICD-10-CM | POA: Diagnosis not present

## 2019-03-31 DIAGNOSIS — H35103 Retinopathy of prematurity, unspecified, bilateral: Secondary | ICD-10-CM | POA: Diagnosis not present

## 2019-03-31 DIAGNOSIS — Z961 Presence of intraocular lens: Secondary | ICD-10-CM | POA: Diagnosis not present

## 2019-03-31 DIAGNOSIS — H18421 Band keratopathy, right eye: Secondary | ICD-10-CM | POA: Diagnosis not present

## 2019-03-31 DIAGNOSIS — H35372 Puckering of macula, left eye: Secondary | ICD-10-CM | POA: Diagnosis not present

## 2019-03-31 DIAGNOSIS — H15101 Unspecified episcleritis, right eye: Secondary | ICD-10-CM | POA: Diagnosis not present

## 2019-04-23 DIAGNOSIS — M25572 Pain in left ankle and joints of left foot: Secondary | ICD-10-CM | POA: Diagnosis not present

## 2019-04-23 DIAGNOSIS — M67962 Unspecified disorder of synovium and tendon, left lower leg: Secondary | ICD-10-CM | POA: Diagnosis not present

## 2019-05-22 ENCOUNTER — Encounter: Payer: Self-pay | Admitting: Cardiology

## 2019-05-22 ENCOUNTER — Ambulatory Visit (INDEPENDENT_AMBULATORY_CARE_PROVIDER_SITE_OTHER): Payer: Medicare Other | Admitting: Cardiology

## 2019-05-22 ENCOUNTER — Other Ambulatory Visit: Payer: Self-pay

## 2019-05-22 VITALS — BP 152/82 | HR 81 | Ht 66.5 in | Wt 180.0 lb

## 2019-05-22 DIAGNOSIS — I10 Essential (primary) hypertension: Secondary | ICD-10-CM

## 2019-05-22 DIAGNOSIS — E782 Mixed hyperlipidemia: Secondary | ICD-10-CM | POA: Diagnosis not present

## 2019-05-22 DIAGNOSIS — I4439 Other atrioventricular block: Secondary | ICD-10-CM | POA: Diagnosis not present

## 2019-05-22 DIAGNOSIS — I2510 Atherosclerotic heart disease of native coronary artery without angina pectoris: Secondary | ICD-10-CM

## 2019-05-22 DIAGNOSIS — I454 Nonspecific intraventricular block: Secondary | ICD-10-CM

## 2019-05-22 DIAGNOSIS — I447 Left bundle-branch block, unspecified: Secondary | ICD-10-CM | POA: Diagnosis not present
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
## 2019-05-22 NOTE — Patient Instructions (Addendum)
Your physician recommends that you continue on your current medications as directed. Please refer to the Current Medication list given to you today.     Your physician wants you to follow-up in: 6 MONTHS WITH DR NELSON You will receive a reminder letter in the mail two months in advance. If you don't receive a letter, please call our office to schedule the follow-up appointment.  

## 2019-05-25 DIAGNOSIS — M67962 Unspecified disorder of synovium and tendon, left lower leg: Secondary | ICD-10-CM | POA: Diagnosis not present

## 2019-06-02 DIAGNOSIS — H35103 Retinopathy of prematurity, unspecified, bilateral: Secondary | ICD-10-CM | POA: Diagnosis not present

## 2019-06-02 DIAGNOSIS — H20021 Recurrent acute iridocyclitis, right eye: Secondary | ICD-10-CM | POA: Diagnosis not present

## 2019-06-02 DIAGNOSIS — H33102 Unspecified retinoschisis, left eye: Secondary | ICD-10-CM | POA: Diagnosis not present

## 2019-06-02 DIAGNOSIS — H33312 Horseshoe tear of retina without detachment, left eye: Secondary | ICD-10-CM | POA: Diagnosis not present

## 2019-06-02 DIAGNOSIS — H35372 Puckering of macula, left eye: Secondary | ICD-10-CM | POA: Diagnosis not present

## 2019-06-02 DIAGNOSIS — Z961 Presence of intraocular lens: Secondary | ICD-10-CM | POA: Diagnosis not present

## 2019-06-09 DIAGNOSIS — H52222 Regular astigmatism, left eye: Secondary | ICD-10-CM | POA: Diagnosis not present

## 2019-06-09 DIAGNOSIS — H40053 Ocular hypertension, bilateral: Secondary | ICD-10-CM | POA: Diagnosis not present

## 2019-06-09 DIAGNOSIS — H5212 Myopia, left eye: Secondary | ICD-10-CM | POA: Diagnosis not present

## 2019-06-12 DIAGNOSIS — Z23 Encounter for immunization: Secondary | ICD-10-CM | POA: Diagnosis not present

## 2019-07-08 DIAGNOSIS — H33102 Unspecified retinoschisis, left eye: Secondary | ICD-10-CM | POA: Diagnosis not present

## 2019-07-08 DIAGNOSIS — H35372 Puckering of macula, left eye: Secondary | ICD-10-CM | POA: Diagnosis not present

## 2019-07-08 DIAGNOSIS — H20021 Recurrent acute iridocyclitis, right eye: Secondary | ICD-10-CM | POA: Diagnosis not present

## 2019-07-08 DIAGNOSIS — Z961 Presence of intraocular lens: Secondary | ICD-10-CM | POA: Diagnosis not present

## 2019-07-08 DIAGNOSIS — H33312 Horseshoe tear of retina without detachment, left eye: Secondary | ICD-10-CM | POA: Diagnosis not present

## 2019-07-08 DIAGNOSIS — H35103 Retinopathy of prematurity, unspecified, bilateral: Secondary | ICD-10-CM | POA: Diagnosis not present

## 2019-09-02 ENCOUNTER — Other Ambulatory Visit: Payer: Self-pay

## 2019-09-02 MED ORDER — RAMIPRIL 2.5 MG PO CAPS: 2.5000 mg | ORAL_CAPSULE | Freq: Every day | ORAL | 3 refills | Status: DC

## 2019-11-06 DIAGNOSIS — E7849 Other hyperlipidemia: Secondary | ICD-10-CM | POA: Diagnosis not present

## 2019-11-06 DIAGNOSIS — M10072 Idiopathic gout, left ankle and foot: Secondary | ICD-10-CM | POA: Diagnosis not present

## 2019-11-06 DIAGNOSIS — R7301 Impaired fasting glucose: Secondary | ICD-10-CM | POA: Diagnosis not present

## 2019-11-06 DIAGNOSIS — Z125 Encounter for screening for malignant neoplasm of prostate: Secondary | ICD-10-CM | POA: Diagnosis not present

## 2019-11-10 DIAGNOSIS — H35103 Retinopathy of prematurity, unspecified, bilateral: Secondary | ICD-10-CM | POA: Diagnosis not present

## 2019-11-10 DIAGNOSIS — Z961 Presence of intraocular lens: Secondary | ICD-10-CM | POA: Diagnosis not present

## 2019-11-10 DIAGNOSIS — H18421 Band keratopathy, right eye: Secondary | ICD-10-CM | POA: Diagnosis not present

## 2019-11-10 DIAGNOSIS — H35372 Puckering of macula, left eye: Secondary | ICD-10-CM | POA: Diagnosis not present

## 2019-11-10 DIAGNOSIS — H20021 Recurrent acute iridocyclitis, right eye: Secondary | ICD-10-CM | POA: Diagnosis not present

## 2019-11-10 DIAGNOSIS — H33312 Horseshoe tear of retina without detachment, left eye: Secondary | ICD-10-CM | POA: Diagnosis not present

## 2019-11-13 DIAGNOSIS — R918 Other nonspecific abnormal finding of lung field: Secondary | ICD-10-CM | POA: Diagnosis not present

## 2019-11-13 DIAGNOSIS — I447 Left bundle-branch block, unspecified: Secondary | ICD-10-CM | POA: Diagnosis not present

## 2019-11-13 DIAGNOSIS — K5790 Diverticulosis of intestine, part unspecified, without perforation or abscess without bleeding: Secondary | ICD-10-CM | POA: Diagnosis not present

## 2019-11-13 DIAGNOSIS — R82998 Other abnormal findings in urine: Secondary | ICD-10-CM | POA: Diagnosis not present

## 2019-11-13 DIAGNOSIS — R05 Cough: Secondary | ICD-10-CM | POA: Diagnosis not present

## 2019-11-13 DIAGNOSIS — D7589 Other specified diseases of blood and blood-forming organs: Secondary | ICD-10-CM | POA: Diagnosis not present

## 2019-11-13 DIAGNOSIS — I2583 Coronary atherosclerosis due to lipid rich plaque: Secondary | ICD-10-CM | POA: Diagnosis not present

## 2019-11-13 DIAGNOSIS — Z0000 Encounter for general adult medical examination without abnormal findings: Secondary | ICD-10-CM | POA: Diagnosis not present

## 2019-11-13 DIAGNOSIS — E785 Hyperlipidemia, unspecified: Secondary | ICD-10-CM | POA: Diagnosis not present

## 2019-11-13 DIAGNOSIS — M2011 Hallux valgus (acquired), right foot: Secondary | ICD-10-CM | POA: Diagnosis not present

## 2019-11-13 DIAGNOSIS — R490 Dysphonia: Secondary | ICD-10-CM | POA: Diagnosis not present

## 2019-11-13 DIAGNOSIS — Z1331 Encounter for screening for depression: Secondary | ICD-10-CM | POA: Diagnosis not present

## 2019-11-13 DIAGNOSIS — K4090 Unilateral inguinal hernia, without obstruction or gangrene, not specified as recurrent: Secondary | ICD-10-CM | POA: Diagnosis not present

## 2019-11-13 DIAGNOSIS — R972 Elevated prostate specific antigen [PSA]: Secondary | ICD-10-CM | POA: Diagnosis not present

## 2019-11-18 ENCOUNTER — Other Ambulatory Visit: Payer: Self-pay | Admitting: Internal Medicine

## 2019-11-18 DIAGNOSIS — J439 Emphysema, unspecified: Secondary | ICD-10-CM

## 2019-11-18 DIAGNOSIS — J479 Bronchiectasis, uncomplicated: Secondary | ICD-10-CM

## 2019-11-26 ENCOUNTER — Ambulatory Visit
Admission: RE | Admit: 2019-11-26 | Discharge: 2019-11-26 | Disposition: A | Discharge status: Home / Self Care | Payer: Medicare Other | Source: Ambulatory Visit | Attending: Internal Medicine | Admitting: Internal Medicine

## 2019-11-26 DIAGNOSIS — J439 Emphysema, unspecified: Secondary | ICD-10-CM | POA: Diagnosis not present

## 2019-11-26 DIAGNOSIS — J479 Bronchiectasis, uncomplicated: Secondary | ICD-10-CM

## 2019-12-01 ENCOUNTER — Other Ambulatory Visit: Payer: Medicare Other

## 2019-12-04 DIAGNOSIS — R972 Elevated prostate specific antigen [PSA]: Secondary | ICD-10-CM | POA: Diagnosis not present

## 2019-12-04 DIAGNOSIS — N401 Enlarged prostate with lower urinary tract symptoms: Secondary | ICD-10-CM | POA: Diagnosis not present

## 2019-12-04 DIAGNOSIS — Z1212 Encounter for screening for malignant neoplasm of rectum: Secondary | ICD-10-CM | POA: Diagnosis not present

## 2019-12-04 DIAGNOSIS — R351 Nocturia: Secondary | ICD-10-CM | POA: Diagnosis not present

## 2019-12-24 DIAGNOSIS — Z23 Encounter for immunization: Secondary | ICD-10-CM | POA: Diagnosis not present

## 2020-01-04 DIAGNOSIS — L812 Freckles: Secondary | ICD-10-CM | POA: Diagnosis not present

## 2020-01-04 DIAGNOSIS — L570 Actinic keratosis: Secondary | ICD-10-CM | POA: Diagnosis not present

## 2020-01-04 DIAGNOSIS — L821 Other seborrheic keratosis: Secondary | ICD-10-CM | POA: Diagnosis not present

## 2020-01-06 ENCOUNTER — Encounter: Payer: Self-pay | Admitting: *Deleted

## 2020-01-06 ENCOUNTER — Other Ambulatory Visit: Payer: Self-pay

## 2020-01-06 ENCOUNTER — Ambulatory Visit (INDEPENDENT_AMBULATORY_CARE_PROVIDER_SITE_OTHER): Payer: Medicare Other | Admitting: Cardiology

## 2020-01-06 ENCOUNTER — Encounter: Payer: Self-pay | Admitting: Cardiology

## 2020-01-06 VITALS — BP 162/80 | HR 72 | Ht 66.0 in | Wt 188.2 lb

## 2020-01-06 DIAGNOSIS — I447 Left bundle-branch block, unspecified: Secondary | ICD-10-CM | POA: Diagnosis not present

## 2020-01-06 DIAGNOSIS — I10 Essential (primary) hypertension: Secondary | ICD-10-CM | POA: Diagnosis not present

## 2020-01-06 DIAGNOSIS — I2510 Atherosclerotic heart disease of native coronary artery without angina pectoris: Secondary | ICD-10-CM

## 2020-01-06 DIAGNOSIS — R0600 Dyspnea, unspecified: Secondary | ICD-10-CM | POA: Diagnosis not present

## 2020-01-06 DIAGNOSIS — E785 Hyperlipidemia, unspecified: Secondary | ICD-10-CM | POA: Diagnosis not present

## 2020-01-06 DIAGNOSIS — R0609 Other forms of dyspnea: Secondary | ICD-10-CM

## 2020-01-06 MED ORDER — RAMIPRIL 5 MG PO CAPS: 5.0000 mg | ORAL_CAPSULE | Freq: Every day | ORAL | 2 refills | Status: DC

## 2020-01-06 MED ORDER — SPIRIVA HANDIHALER 18 MCG IN CAPS: 18.0000 ug | ORAL_CAPSULE | Freq: Every day | RESPIRATORY_TRACT | 12 refills | Status: DC
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
## 2020-01-06 NOTE — Patient Instructions (Signed)
Medication Instructions:   INCREASE YOUR RAMIPRIL TO 5 MG BY MOUTH DAILY  DR. Meda Coffee HAS PRESCRIBED YOU SPIRIVA INHALER--PLACE ONE CAPSULE INTO YOUR INHALER, AND INHALE DAILY  *If you need a refill on your cardiac medications before your next appointment, please call your pharmacy*   Testing/Procedures:  Your physician has requested that you have a lexiscan myoview. For further information please visit [email or web address]. Please follow instruction sheet, as given.  PER DR. Meda Coffee DO ON D-SPECT   Follow-Up:  ADD TO DR. Lennie Hummer FOR SOMETIME IN June 2021 PER DR. Meda Coffee

## 2020-01-07 ENCOUNTER — Telehealth (HOSPITAL_COMMUNITY): Payer: Self-pay | Admitting: *Deleted

## 2020-01-07 NOTE — Telephone Encounter (Signed)
Patient given detailed instructions per Myocardial Perfusion Study Information Sheet for the test on 01/11/20 at 7:45. Patient notified to arrive 15 minutes early and that it is imperative to arrive on time for appointment to keep from having the test rescheduled.  If you need to cancel or reschedule your appointment, please call the office within 24 hours of your appointment. . Patient verbalized understanding.Reginald Davis

## 2020-01-11 ENCOUNTER — Other Ambulatory Visit: Payer: Self-pay

## 2020-01-11 ENCOUNTER — Ambulatory Visit (HOSPITAL_COMMUNITY): Payer: Medicare Other | Attending: Internal Medicine

## 2020-01-11 DIAGNOSIS — R0609 Other forms of dyspnea: Secondary | ICD-10-CM

## 2020-01-11 DIAGNOSIS — R0600 Dyspnea, unspecified: Secondary | ICD-10-CM

## 2020-01-11 DIAGNOSIS — I2510 Atherosclerotic heart disease of native coronary artery without angina pectoris: Secondary | ICD-10-CM | POA: Diagnosis not present

## 2020-01-11 LAB — MYOCARDIAL PERFUSION IMAGING
LV dias vol: 99 mL (ref 62–150)
LV sys vol: 56 mL
Peak HR: 91 {beats}/min
Rest HR: 64 {beats}/min
SDS: 1
SRS: 1
SSS: 2
TID: 1.07

## 2020-01-11 MED ORDER — REGADENOSON 0.4 MG/5ML IV SOLN
0.4000 mg | Freq: Once | INTRAVENOUS | Status: AC
  Administered 2020-01-11: 0.4 mg via INTRAVENOUS

## 2020-01-11 MED ORDER — TECHNETIUM TC 99M TETROFOSMIN IV KIT
10.7000 | PACK | Freq: Once | INTRAVENOUS | Status: AC | PRN
  Administered 2020-01-11: 10.7 via INTRAVENOUS
  Filled 2020-01-11: qty 11

## 2020-01-11 MED ORDER — TECHNETIUM TC 99M TETROFOSMIN IV KIT
31.7000 | PACK | Freq: Once | INTRAVENOUS | Status: AC | PRN
  Administered 2020-01-11: 31.7 via INTRAVENOUS
  Filled 2020-01-11: qty 32

## 2020-01-12 ENCOUNTER — Telehealth: Payer: Self-pay | Admitting: *Deleted

## 2020-01-12 DIAGNOSIS — R931 Abnormal findings on diagnostic imaging of heart and coronary circulation: Secondary | ICD-10-CM

## 2020-01-12 DIAGNOSIS — I2510 Atherosclerotic heart disease of native coronary artery without angina pectoris: Secondary | ICD-10-CM

## 2020-01-12 DIAGNOSIS — R0609 Other forms of dyspnea: Secondary | ICD-10-CM

## 2020-01-12 NOTE — Telephone Encounter (Signed)
-----   Message from Dorothy Spark, MD sent at 01/12/2020  9:18 AM EDT ----- No ischemia, decreased LVEF, please repeat echocardiogram

## 2020-01-12 NOTE — Telephone Encounter (Signed)
Spoke with the pt and informed him that per Dr. Meda Coffee, his stress test showed no ischemia, but decreased LVEF, and to further assess this, she recommends that he get a repeat echo done.  Informed the pt that I will place the order for the echo in the system and send a message to our Echo [facility], to call him back and arrange this appt.  Pt verbalized understanding and agrees with this plan.

## 2020-01-26 ENCOUNTER — Ambulatory Visit (HOSPITAL_COMMUNITY): Payer: Medicare Other | Attending: Cardiovascular Disease

## 2020-01-26 ENCOUNTER — Other Ambulatory Visit: Payer: Self-pay

## 2020-01-26 DIAGNOSIS — R931 Abnormal findings on diagnostic imaging of heart and coronary circulation: Secondary | ICD-10-CM | POA: Insufficient documentation

## 2020-01-26 DIAGNOSIS — R0600 Dyspnea, unspecified: Secondary | ICD-10-CM

## 2020-01-26 DIAGNOSIS — R0609 Other forms of dyspnea: Secondary | ICD-10-CM

## 2020-01-26 DIAGNOSIS — I2510 Atherosclerotic heart disease of native coronary artery without angina pectoris: Secondary | ICD-10-CM | POA: Insufficient documentation

## 2020-02-09 DIAGNOSIS — H35372 Puckering of macula, left eye: Secondary | ICD-10-CM | POA: Diagnosis not present

## 2020-02-09 DIAGNOSIS — H33312 Horseshoe tear of retina without detachment, left eye: Secondary | ICD-10-CM | POA: Diagnosis not present

## 2020-02-09 DIAGNOSIS — H35103 Retinopathy of prematurity, unspecified, bilateral: Secondary | ICD-10-CM | POA: Diagnosis not present

## 2020-02-09 DIAGNOSIS — H20021 Recurrent acute iridocyclitis, right eye: Secondary | ICD-10-CM | POA: Diagnosis not present

## 2020-03-08 ENCOUNTER — Encounter: Payer: Self-pay | Admitting: Cardiology

## 2020-03-08 ENCOUNTER — Ambulatory Visit (INDEPENDENT_AMBULATORY_CARE_PROVIDER_SITE_OTHER): Payer: Medicare Other | Admitting: Cardiology

## 2020-03-08 ENCOUNTER — Other Ambulatory Visit: Payer: Self-pay

## 2020-03-08 VITALS — BP 126/80 | HR 70 | Ht 66.0 in | Wt 187.0 lb

## 2020-03-08 DIAGNOSIS — E785 Hyperlipidemia, unspecified: Secondary | ICD-10-CM | POA: Diagnosis not present

## 2020-03-08 DIAGNOSIS — I2510 Atherosclerotic heart disease of native coronary artery without angina pectoris: Secondary | ICD-10-CM | POA: Diagnosis not present

## 2020-03-08 DIAGNOSIS — Z79899 Other long term (current) drug therapy: Secondary | ICD-10-CM

## 2020-03-08 DIAGNOSIS — E782 Mixed hyperlipidemia: Secondary | ICD-10-CM

## 2020-03-08 DIAGNOSIS — I10 Essential (primary) hypertension: Secondary | ICD-10-CM

## 2020-03-08 DIAGNOSIS — I25119 Atherosclerotic heart disease of native coronary artery with unspecified angina pectoris: Secondary | ICD-10-CM

## 2020-03-08 MED ORDER — VALSARTAN 160 MG PO TABS: 160.0000 mg | ORAL_TABLET | Freq: Every day | ORAL | 2 refills | Status: DC

## 2020-03-08 NOTE — Patient Instructions (Addendum)
Medication Instructions:   STOP TAKING RAMIPRIL NOW  START TAKING VALSARTAN 160 MG BY MOUTH DAILY  *If you need a refill on your cardiac medications before your next appointment, please call your pharmacy*   Lab Work:  [place] OFFICE--WE WILL CHECK --CMET, CBC, TSH, HEMOGLOBIN A1C AND LIPIDS--PLEASE COME FASTING TO THIS LAB APPOINTMENT  If you have labs (blood work) drawn today and your tests are completely normal, you will receive your results only by: Marland Kitchen MyChart Message (if you have MyChart) OR . A paper copy in the mail If you have any lab test that is abnormal or we need to change your treatment, we will call you to review the results.    Follow-Up:  4 MONTHS WITH DR. Meda Coffee IN THE OFFICE
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
## 2020-03-08 NOTE — Addendum Note (Signed)
Addended by: Nuala Alpha on: 03/08/2020 09:11 AM   Modules accepted: Orders

## 2020-03-21 ENCOUNTER — Other Ambulatory Visit: Payer: Self-pay

## 2020-03-21 ENCOUNTER — Other Ambulatory Visit: Payer: Medicare Other | Admitting: *Deleted

## 2020-03-21 DIAGNOSIS — E785 Hyperlipidemia, unspecified: Secondary | ICD-10-CM

## 2020-03-21 DIAGNOSIS — I2510 Atherosclerotic heart disease of native coronary artery without angina pectoris: Secondary | ICD-10-CM

## 2020-03-21 DIAGNOSIS — I10 Essential (primary) hypertension: Secondary | ICD-10-CM

## 2020-03-21 DIAGNOSIS — I25119 Atherosclerotic heart disease of native coronary artery with unspecified angina pectoris: Secondary | ICD-10-CM

## 2020-03-21 DIAGNOSIS — Z79899 Other long term (current) drug therapy: Secondary | ICD-10-CM

## 2020-03-21 DIAGNOSIS — E782 Mixed hyperlipidemia: Secondary | ICD-10-CM

## 2020-03-22 LAB — COMPREHENSIVE METABOLIC PANEL
ALT: 35 IU/L (ref 0–44)
AST: 29 IU/L (ref 0–40)
Albumin/Globulin Ratio: 1.8 (ref 1.2–2.2)
Albumin: 4.2 g/dL (ref 3.7–4.7)
Alkaline Phosphatase: 52 IU/L (ref 48–121)
BUN/Creatinine Ratio: 17 (ref 10–24)
BUN: 14 mg/dL (ref 8–27)
Bilirubin Total: 0.3 mg/dL (ref 0.0–1.2)
CO2: 22 mmol/L (ref 20–29)
Calcium: 9.2 mg/dL (ref 8.6–10.2)
Chloride: 106 mmol/L (ref 96–106)
Creatinine, Ser: 0.83 mg/dL (ref 0.76–1.27)
GFR calc Af Amer: 101 mL/min/{1.73_m2} (ref >59)
GFR calc non Af Amer: 87 mL/min/{1.73_m2} (ref >59)
Globulin, Total: 2.4 g/dL (ref 1.5–4.5)
Glucose: 115 mg/dL — ABNORMAL HIGH (ref 65–99)
Potassium: 4.9 mmol/L (ref 3.5–5.2)
Sodium: 141 mmol/L (ref 134–144)
Total Protein: 6.6 g/dL (ref 6.0–8.5)

## 2020-03-22 LAB — LIPID PANEL
Chol/HDL Ratio: 1.8 ratio (ref 0.0–5.0)
Cholesterol, Total: 83 mg/dL — ABNORMAL LOW (ref 100–199)
HDL: 46 mg/dL (ref >39)
LDL Chol Calc (NIH): 11 mg/dL (ref 0–99)
Triglycerides: 156 mg/dL — ABNORMAL HIGH (ref 0–149)
VLDL Cholesterol Cal: 26 mg/dL (ref 5–40)

## 2020-03-22 LAB — HEMOGLOBIN A1C
Est. average glucose Bld gHb Est-mCnc: 134 mg/dL
Hgb A1c MFr Bld: 6.3 % — ABNORMAL HIGH (ref 4.8–5.6)

## 2020-03-22 LAB — CBC
Hematocrit: 44.9 % (ref 37.5–51.0)
Hemoglobin: 15.6 g/dL (ref 13.0–17.7)
MCH: 34.2 pg — ABNORMAL HIGH (ref 26.6–33.0)
MCHC: 34.7 g/dL (ref 31.5–35.7)
MCV: 99 fL — ABNORMAL HIGH (ref 79–97)
Platelets: 298 10*3/uL (ref 150–450)
RBC: 4.56 x10E6/uL (ref 4.14–5.80)
RDW: 12.6 % (ref 11.6–15.4)
WBC: 8.5 10*3/uL (ref 3.4–10.8)

## 2020-03-22 LAB — TSH: TSH: 4.76 u[IU]/mL — ABNORMAL HIGH (ref 0.450–4.500)

## 2020-04-13 DIAGNOSIS — H52222 Regular astigmatism, left eye: Secondary | ICD-10-CM | POA: Diagnosis not present

## 2020-04-13 DIAGNOSIS — H35372 Puckering of macula, left eye: Secondary | ICD-10-CM | POA: Diagnosis not present

## 2020-04-13 DIAGNOSIS — Z961 Presence of intraocular lens: Secondary | ICD-10-CM | POA: Diagnosis not present

## 2020-04-13 DIAGNOSIS — H20011 Primary iridocyclitis, right eye: Secondary | ICD-10-CM | POA: Diagnosis not present

## 2020-04-13 DIAGNOSIS — H59812 Chorioretinal scars after surgery for detachment, left eye: Secondary | ICD-10-CM | POA: Diagnosis not present

## 2020-04-13 DIAGNOSIS — H5212 Myopia, left eye: Secondary | ICD-10-CM | POA: Diagnosis not present

## 2020-04-13 DIAGNOSIS — H33302 Unspecified retinal break, left eye: Secondary | ICD-10-CM | POA: Diagnosis not present

## 2020-04-29 ENCOUNTER — Other Ambulatory Visit: Payer: Self-pay | Admitting: Internal Medicine

## 2020-04-29 DIAGNOSIS — M542 Cervicalgia: Secondary | ICD-10-CM

## 2020-05-02 ENCOUNTER — Ambulatory Visit
Admission: RE | Admit: 2020-05-02 | Discharge: 2020-05-02 | Disposition: A | Discharge status: Home / Self Care | Payer: Medicare Other | Source: Ambulatory Visit | Attending: Internal Medicine | Admitting: Internal Medicine

## 2020-05-02 ENCOUNTER — Other Ambulatory Visit: Payer: Self-pay

## 2020-05-02 DIAGNOSIS — M4802 Spinal stenosis, cervical region: Secondary | ICD-10-CM | POA: Diagnosis not present

## 2020-05-02 DIAGNOSIS — M542 Cervicalgia: Secondary | ICD-10-CM

## 2020-05-03 DIAGNOSIS — H33312 Horseshoe tear of retina without detachment, left eye: Secondary | ICD-10-CM | POA: Diagnosis not present

## 2020-05-03 DIAGNOSIS — H20021 Recurrent acute iridocyclitis, right eye: Secondary | ICD-10-CM | POA: Diagnosis not present

## 2020-05-03 DIAGNOSIS — H35372 Puckering of macula, left eye: Secondary | ICD-10-CM | POA: Diagnosis not present

## 2020-05-03 DIAGNOSIS — H35103 Retinopathy of prematurity, unspecified, bilateral: Secondary | ICD-10-CM | POA: Diagnosis not present

## 2020-05-24 ENCOUNTER — Encounter: Payer: Self-pay | Admitting: Cardiology

## 2020-06-03 DIAGNOSIS — Z23 Encounter for immunization: Secondary | ICD-10-CM | POA: Diagnosis not present

## 2020-06-06 DIAGNOSIS — Z6830 Body mass index (BMI) 30.0-30.9, adult: Secondary | ICD-10-CM | POA: Diagnosis not present

## 2020-06-06 DIAGNOSIS — M542 Cervicalgia: Secondary | ICD-10-CM | POA: Diagnosis not present

## 2020-06-06 DIAGNOSIS — I10 Essential (primary) hypertension: Secondary | ICD-10-CM | POA: Diagnosis not present

## 2020-06-06 DIAGNOSIS — G5131 Clonic hemifacial spasm, right: Secondary | ICD-10-CM | POA: Diagnosis not present

## 2020-06-06 DIAGNOSIS — M47812 Spondylosis without myelopathy or radiculopathy, cervical region: Secondary | ICD-10-CM | POA: Diagnosis not present

## 2020-06-15 DIAGNOSIS — I10 Essential (primary) hypertension: Secondary | ICD-10-CM | POA: Diagnosis not present

## 2020-06-15 DIAGNOSIS — R030 Elevated blood-pressure reading, without diagnosis of hypertension: Secondary | ICD-10-CM | POA: Diagnosis not present

## 2020-06-15 DIAGNOSIS — J441 Chronic obstructive pulmonary disease with (acute) exacerbation: Secondary | ICD-10-CM | POA: Diagnosis not present

## 2020-06-15 DIAGNOSIS — Z7982 Long term (current) use of aspirin: Secondary | ICD-10-CM | POA: Diagnosis not present

## 2020-06-22 DIAGNOSIS — G5131 Clonic hemifacial spasm, right: Secondary | ICD-10-CM | POA: Diagnosis not present

## 2020-06-22 DIAGNOSIS — M542 Cervicalgia: Secondary | ICD-10-CM | POA: Diagnosis not present

## 2020-06-22 DIAGNOSIS — K219 Gastro-esophageal reflux disease without esophagitis: Secondary | ICD-10-CM | POA: Diagnosis not present

## 2020-06-22 DIAGNOSIS — J479 Bronchiectasis, uncomplicated: Secondary | ICD-10-CM | POA: Diagnosis not present

## 2020-06-22 DIAGNOSIS — R7301 Impaired fasting glucose: Secondary | ICD-10-CM | POA: Diagnosis not present

## 2020-06-22 DIAGNOSIS — J439 Emphysema, unspecified: Secondary | ICD-10-CM | POA: Diagnosis not present

## 2020-06-22 DIAGNOSIS — I2583 Coronary atherosclerosis due to lipid rich plaque: Secondary | ICD-10-CM | POA: Diagnosis not present

## 2020-06-22 DIAGNOSIS — M109 Gout, unspecified: Secondary | ICD-10-CM | POA: Diagnosis not present

## 2020-06-22 DIAGNOSIS — M47812 Spondylosis without myelopathy or radiculopathy, cervical region: Secondary | ICD-10-CM | POA: Diagnosis not present

## 2020-06-22 DIAGNOSIS — Z23 Encounter for immunization: Secondary | ICD-10-CM | POA: Diagnosis not present

## 2020-06-22 DIAGNOSIS — E785 Hyperlipidemia, unspecified: Secondary | ICD-10-CM | POA: Diagnosis not present

## 2020-06-22 DIAGNOSIS — G5602 Carpal tunnel syndrome, left upper limb: Secondary | ICD-10-CM | POA: Diagnosis not present

## 2020-06-23 DIAGNOSIS — M47892 Other spondylosis, cervical region: Secondary | ICD-10-CM | POA: Diagnosis not present

## 2020-06-28 DIAGNOSIS — M47892 Other spondylosis, cervical region: Secondary | ICD-10-CM | POA: Diagnosis not present

## 2020-06-30 DIAGNOSIS — M47892 Other spondylosis, cervical region: Secondary | ICD-10-CM | POA: Diagnosis not present

## 2020-07-05 DIAGNOSIS — M47892 Other spondylosis, cervical region: Secondary | ICD-10-CM | POA: Diagnosis not present

## 2020-07-07 DIAGNOSIS — M47892 Other spondylosis, cervical region: Secondary | ICD-10-CM | POA: Diagnosis not present

## 2020-07-08 ENCOUNTER — Ambulatory Visit: Payer: Medicare Other | Admitting: Cardiology

## 2020-07-11 ENCOUNTER — Encounter: Payer: Self-pay | Admitting: Cardiology

## 2020-07-11 ENCOUNTER — Ambulatory Visit
Admission: RE | Admit: 2020-07-11 | Discharge: 2020-07-11 | Disposition: A | Discharge status: Home / Self Care | Payer: Medicare Other | Source: Ambulatory Visit | Attending: Cardiology | Admitting: Cardiology

## 2020-07-11 ENCOUNTER — Ambulatory Visit (INDEPENDENT_AMBULATORY_CARE_PROVIDER_SITE_OTHER): Payer: Medicare Other | Admitting: Cardiology

## 2020-07-11 ENCOUNTER — Other Ambulatory Visit: Payer: Self-pay

## 2020-07-11 VITALS — BP 142/80 | HR 74 | Ht 66.0 in | Wt 185.6 lb

## 2020-07-11 DIAGNOSIS — R0600 Dyspnea, unspecified: Secondary | ICD-10-CM

## 2020-07-11 DIAGNOSIS — I447 Left bundle-branch block, unspecified: Secondary | ICD-10-CM | POA: Diagnosis not present

## 2020-07-11 DIAGNOSIS — J441 Chronic obstructive pulmonary disease with (acute) exacerbation: Secondary | ICD-10-CM | POA: Diagnosis not present

## 2020-07-11 DIAGNOSIS — J449 Chronic obstructive pulmonary disease, unspecified: Secondary | ICD-10-CM

## 2020-07-11 DIAGNOSIS — R0609 Other forms of dyspnea: Secondary | ICD-10-CM | POA: Diagnosis not present

## 2020-07-11 DIAGNOSIS — E785 Hyperlipidemia, unspecified: Secondary | ICD-10-CM | POA: Diagnosis not present

## 2020-07-11 DIAGNOSIS — I10 Essential (primary) hypertension: Secondary | ICD-10-CM

## 2020-07-11 DIAGNOSIS — I2510 Atherosclerotic heart disease of native coronary artery without angina pectoris: Secondary | ICD-10-CM

## 2020-07-11 MED ORDER — VALSARTAN 320 MG PO TABS: 320.0000 mg | ORAL_TABLET | Freq: Every day | ORAL | 2 refills | Status: DC

## 2020-07-11 NOTE — Patient Instructions (Addendum)
Medication Instructions:   INCREASE YOUR VALSARTAN TO 320 MG BY MOUTH DAILY  *If you need a refill on your cardiac medications before your next appointment, please call your pharmacy*   You have been referred to [facility] SEE DR. RAMASWAMY    Testing/Procedures:  Your physician has recommended that you have a pulmonary function test. Pulmonary Function Tests are a group of tests that measure how well air moves in and out of your lungs.  PFTs ORDERED AT [facility] PULMONOLOGY AS WELL.  A CHEST X-RAY TO BE DONE AT [facility]   Follow-Up:  4 MONTHS IN THE OFFICE WITH DR. Benita Stabile ADD THE PATIENT TO November 08, 2020 AT 2 PM SLOT--OK TO ADD PER Rayyan Burley AND DR. NELSON
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
## 2020-07-12 ENCOUNTER — Ambulatory Visit (INDEPENDENT_AMBULATORY_CARE_PROVIDER_SITE_OTHER): Payer: Medicare Other | Admitting: Internal Medicine

## 2020-07-12 DIAGNOSIS — J449 Chronic obstructive pulmonary disease, unspecified: Secondary | ICD-10-CM

## 2020-07-12 DIAGNOSIS — M47892 Other spondylosis, cervical region: Secondary | ICD-10-CM | POA: Diagnosis not present

## 2020-07-12 DIAGNOSIS — R0600 Dyspnea, unspecified: Secondary | ICD-10-CM

## 2020-07-12 DIAGNOSIS — R0609 Other forms of dyspnea: Secondary | ICD-10-CM

## 2020-07-12 LAB — PULMONARY FUNCTION TEST
DL/VA % pred: 80 %
DL/VA: 3.26 ml/min/mmHg/L
DLCO cor % pred: 73 %
DLCO cor: 16.88 ml/min/mmHg
DLCO unc % pred: 73 %
DLCO unc: 16.88 ml/min/mmHg
FEF 25-75 Post: 1.84 L/sec
FEF 25-75 Pre: 1.69 L/sec
FEF2575-%Change-Post: 9 %
FEF2575-%Pred-Post: 92 %
FEF2575-%Pred-Pre: 84 %
FEV1-%Change-Post: 4 %
FEV1-%Pred-Post: 91 %
FEV1-%Pred-Pre: 88 %
FEV1-Post: 2.51 L
FEV1-Pre: 2.41 L
FEV1FVC-%Change-Post: 2 %
FEV1FVC-%Pred-Pre: 98 %
FEV6-%Change-Post: 3 %
FEV6-%Pred-Post: 96 %
FEV6-%Pred-Pre: 93 %
FEV6-Post: 3.4 L
FEV6-Pre: 3.29 L
FEV6FVC-%Change-Post: 0 %
FEV6FVC-%Pred-Post: 106 %
FEV6FVC-%Pred-Pre: 106 %
FVC-%Change-Post: 1 %
FVC-%Pred-Post: 90 %
FVC-%Pred-Pre: 88 %
FVC-Post: 3.41 L
FVC-Pre: 3.36 L
Post FEV1/FVC ratio: 74 %
Post FEV6/FVC ratio: 100 %
Pre FEV1/FVC ratio: 72 %
Pre FEV6/FVC Ratio: 100 %
RV % pred: 65 %
RV: 1.54 L
TLC % pred: 83 %
TLC: 5.39 L

## 2020-07-12 NOTE — Progress Notes (Signed)
Full PFT performed today. °

## 2020-07-14 DIAGNOSIS — M47892 Other spondylosis, cervical region: Secondary | ICD-10-CM | POA: Diagnosis not present

## 2020-07-19 DIAGNOSIS — M47892 Other spondylosis, cervical region: Secondary | ICD-10-CM | POA: Diagnosis not present

## 2020-07-21 DIAGNOSIS — M47892 Other spondylosis, cervical region: Secondary | ICD-10-CM | POA: Diagnosis not present

## 2020-08-09 ENCOUNTER — Encounter: Payer: Self-pay | Admitting: Internal Medicine

## 2020-08-09 ENCOUNTER — Other Ambulatory Visit: Payer: Self-pay

## 2020-08-09 ENCOUNTER — Ambulatory Visit (INDEPENDENT_AMBULATORY_CARE_PROVIDER_SITE_OTHER): Payer: Medicare Other | Admitting: Internal Medicine

## 2020-08-09 VITALS — BP 130/84 | HR 84 | Temp 97.3°F | Ht 66.0 in | Wt 187.0 lb

## 2020-08-09 DIAGNOSIS — R0600 Dyspnea, unspecified: Secondary | ICD-10-CM | POA: Diagnosis not present

## 2020-08-09 DIAGNOSIS — R0989 Other specified symptoms and signs involving the circulatory and respiratory systems: Secondary | ICD-10-CM | POA: Diagnosis not present

## 2020-08-09 DIAGNOSIS — Z7184 Encounter for health counseling related to travel: Secondary | ICD-10-CM | POA: Diagnosis not present

## 2020-08-09 DIAGNOSIS — I2510 Atherosclerotic heart disease of native coronary artery without angina pectoris: Secondary | ICD-10-CM

## 2020-08-09 DIAGNOSIS — J849 Interstitial pulmonary disease, unspecified: Secondary | ICD-10-CM | POA: Diagnosis not present

## 2020-08-09 DIAGNOSIS — R0609 Other forms of dyspnea: Secondary | ICD-10-CM
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
## 2020-08-09 NOTE — Patient Instructions (Addendum)
ICD-10-CM   1. DOE (dyspnea on exertion)  R06.00   2. Bibasilar crackles  R09.89   3. Travel advice encounter  Z71.84      #Shortness of breath  - I am concerned you  have Interstitial Lung Disease (ILD) otherwise called Pulmonary Fibrosis - if you do have current severity is likely mild/early  -  There are MANY varieties of this  Plan - To narrow down possibilities and assess severity please do the following tests  - do walking test on room air in the office (not 6 min walk test)  - do overnight oxygen test on room air anytime  - do High Resolution CT chest wo contrast - supine and prone, inspiratory and expiratory images (only Dr Rosario Jacks or Dr Weber Cooks or Dr Polly Cobia or Dr Laqueta Carina to read)   - let us get it done this week   - do ILD questionnaire 08/09/2020 and can bring with you at next visit   - based on CT scan results - will order blood work  Scientist, clinical (histocompatibility and immunogenetics) ADvice    - ok for [place] proviided walk test does not show desaturation  - avoid blood clot risks and covid risk with travel  - avoid altitudes   - monitor pulse ox with pulse oxmeter  - if below 88% then you need o2/go to ER  Followup  - return for followup next few to several weeks to discuss results - if scheduling tight - ok to see APP but we can decide this based on CT results

## 2020-08-11 ENCOUNTER — Ambulatory Visit (HOSPITAL_COMMUNITY)
Admission: RE | Admit: 2020-08-11 | Discharge: 2020-08-11 | Disposition: A | Discharge status: Home / Self Care | Payer: Medicare Other | Source: Ambulatory Visit | Attending: Internal Medicine | Admitting: Internal Medicine

## 2020-08-11 ENCOUNTER — Other Ambulatory Visit: Payer: Self-pay

## 2020-08-11 ENCOUNTER — Encounter: Payer: Self-pay | Admitting: Internal Medicine

## 2020-08-11 DIAGNOSIS — J849 Interstitial pulmonary disease, unspecified: Secondary | ICD-10-CM

## 2020-08-11 DIAGNOSIS — J8410 Pulmonary fibrosis, unspecified: Secondary | ICD-10-CM | POA: Diagnosis not present

## 2020-08-11 DIAGNOSIS — J438 Other emphysema: Secondary | ICD-10-CM | POA: Diagnosis not present

## 2020-08-11 DIAGNOSIS — M47812 Spondylosis without myelopathy or radiculopathy, cervical region: Secondary | ICD-10-CM | POA: Diagnosis not present

## 2020-08-11 DIAGNOSIS — G5602 Carpal tunnel syndrome, left upper limb: Secondary | ICD-10-CM | POA: Diagnosis not present

## 2020-08-11 DIAGNOSIS — R0683 Snoring: Secondary | ICD-10-CM | POA: Diagnosis not present

## 2020-08-11 DIAGNOSIS — Z6829 Body mass index (BMI) 29.0-29.9, adult: Secondary | ICD-10-CM | POA: Diagnosis not present

## 2020-08-11 DIAGNOSIS — G4730 Sleep apnea, unspecified: Secondary | ICD-10-CM | POA: Diagnosis not present

## 2020-08-11 DIAGNOSIS — G5131 Clonic hemifacial spasm, right: Secondary | ICD-10-CM | POA: Diagnosis not present

## 2020-08-11 DIAGNOSIS — I10 Essential (primary) hypertension: Secondary | ICD-10-CM | POA: Diagnosis not present

## 2020-08-11 DIAGNOSIS — M542 Cervicalgia: Secondary | ICD-10-CM | POA: Diagnosis not present

## 2020-08-11 DIAGNOSIS — I2510 Atherosclerotic heart disease of native coronary artery without angina pectoris: Secondary | ICD-10-CM | POA: Diagnosis not present

## 2020-08-11 DIAGNOSIS — J432 Centrilobular emphysema: Secondary | ICD-10-CM | POA: Diagnosis not present

## 2020-08-11 NOTE — Telephone Encounter (Signed)
MR please advise. Thanks   I see the finding of probable UIP in today's reading. My current follow-up appointment with Dr. Chase Caller is 09/29/20. I expect he will now want me to move that up. I can come in tomorrow (Friday, December 3) any time for blood work if he'd like to get that started. I leave for [place] on Monday, Dec 6 and am scheduled to return late on Wednesday, Dec 15. I am available beginning Thursday, Dec 16 as he deems prudent. Please advise. Thank you, Clelia Croft

## 2020-08-12 ENCOUNTER — Other Ambulatory Visit (INDEPENDENT_AMBULATORY_CARE_PROVIDER_SITE_OTHER): Payer: Medicare Other

## 2020-08-12 DIAGNOSIS — J849 Interstitial pulmonary disease, unspecified: Secondary | ICD-10-CM

## 2020-08-12 LAB — SEDIMENTATION RATE: Sed Rate: 32 mm/hr — ABNORMAL HIGH (ref 0–20)

## 2020-08-12 MED ORDER — PREDNISONE 10 MG PO TABS: ORAL_TABLET | ORAL | 0 refills | Status: AC

## 2020-08-12 NOTE — Telephone Encounter (Signed)
Called and spoke with pt letting him know the info stated by MR and that I was going to order labs so he could come by office to pick up. Pt already has a doxy Rx that was prescribed by PCP so I have sent Rx for pred taper to pharmacy for him so he can have in case of a flare up.  Pt is also going to drop off ILD Questionnaire packet that will be placed in MR's mail slot. Nothing further needed.

## 2020-08-12 NOTE — Telephone Encounter (Signed)
Reginald Davis leaves for [place] Monday 08/15/20   Plan  - given probable UIP on ct and progression since 2015 (though stable sine earlier this year) -> needle of suspcision is high towards IPF - let him know  - ok to go to [place] 12/6 - 12/15  - avoid high altitudes, and sick contacts   - monitor pulse ox in [place]  - for travel take handy in case of flare u-p - can call t his in 08/12/2020   - Take doxycycline 169m po twice daily x 5 days; take after meals and avoid sunlight - Take prednisone 40 mg daily x 2 days, then 230mdaily x 2 days, then 1022maily x 2 days, then 5mg74mily x 2 days and stop  - blood work needed - can do this 08/12/2020 or upon return  - Serum: ESR, ACE, ANA, DS-DNA, RF, anti-CCP,  ANCA screen, MPO, PR-3, Total CK,  Aldolase, scl-70, ssA, ssB, anti-RNP, anti-JO-1,& Hypersensitivity Pneumonitis Panel  - make appt to see pharmacist in dec 2021 upon return to discuss ofev v esbriet -> based on this start therapy. He can take a decision of the 2 drugs after talking to them. IF they are free he can do a phone visit with them 08/12/2020 or even when [place] more [place] to save time   - ok to see me in Jan 2022 but    Allergies  Allergen Reactions  . Ramipril Cough    PT REPORTS COUGH WHILE TAKING

## 2020-08-15 LAB — SJOGREN'S SYNDROME ANTIBODS(SSA + SSB)
SSA (Ro) (ENA) Antibody, IgG: <1 AI
SSB (La) (ENA) Antibody, IgG: <1 AI

## 2020-08-15 LAB — ANA: Anti Nuclear Antibody (ANA): NEGATIVE

## 2020-08-15 LAB — ANTI-SCLERODERMA ANTIBODY: Scleroderma (Scl-70) (ENA) Antibody, IgG: <1 AI

## 2020-08-15 LAB — ANGIOTENSIN CONVERTING ENZYME: Angiotensin-Converting Enzyme: 23 U/L (ref 9–67)

## 2020-08-15 LAB — CK TOTAL AND CKMB (NOT AT ARMC)
CK, MB: 1.2 ng/mL (ref 0–5.0)
Relative Index: 1.5 (ref 0–4.0)
Total CK: 78 U/L (ref 44–196)

## 2020-08-15 LAB — MPO/PR-3 (ANCA) ANTIBODIES
Myeloperoxidase Abs: <1 AI
Serine Protease 3: <1 AI

## 2020-08-15 LAB — ANCA SCREEN W REFLEX TITER: ANCA Screen: NEGATIVE

## 2020-08-15 LAB — ANTI-DNA ANTIBODY, DOUBLE-STRANDED: ds DNA Ab: <1 IU/mL

## 2020-08-15 LAB — CYCLIC CITRUL PEPTIDE ANTIBODY, IGG: Cyclic Citrullin Peptide Ab: <16 UNITS

## 2020-08-15 LAB — ALDOLASE: Aldolase: 6.2 U/L (ref <=8.1)

## 2020-08-15 LAB — RHEUMATOID FACTOR: Rheumatoid fact SerPl-aCnc: <14 IU/mL (ref <14)

## 2020-08-17 LAB — HYPERSENSITIVITY PNEUMONITIS
A. Pullulans Abs: NEGATIVE
A.Fumigatus #1 Abs: NEGATIVE
Micropolyspora faeni, IgG: NEGATIVE
Pigeon Serum Abs: NEGATIVE
Thermoact. Saccharii: NEGATIVE
Thermoactinomyces vulgaris, IgG: NEGATIVE

## 2020-08-17 LAB — RNP ANTIBODIES: ENA RNP Ab: <0.2 AI (ref 0.0–0.9)

## 2020-08-17 LAB — ANTI-JO 1 ANTIBODY, IGG: Anti JO-1: <0.2 AI (ref 0.0–0.9)

## 2020-08-24 ENCOUNTER — Institutional Professional Consult (permissible substitution): Payer: Medicare Other | Admitting: Internal Medicine

## 2020-08-25 NOTE — Telephone Encounter (Signed)
MR please advise on pt email:     Note to [place] re: My recent testing and my follow-up appointment with Dr. Chase Caller.  Ive returned from [place] as of late last night (Dec 15), and thus far have managed to avoid needing to use my Doxycycline or Prednisone, knock wood. Ive followed my MyChart lab results as best I can, and my limited interpretation is that nothing so far seems to steer away from the probable early UIP/IPF impression. My next appointment with Dr. Chase Caller is currently scheduled for September 29, 2020 at 9:00 AM.  Given that I think Ive completed all of the additional testing he requested, Im wondering whether Dr. Chase Caller might see me any sooner, so that we can discuss his current analysis of diagnosis/prognosis and possible treatment plan? Finally, is it possible for me to get a summary of the results of my overnight blood oxygen study that was done Thursday night, December 2? Im curious. Thank you, Reginald Davis

## 2020-08-26 ENCOUNTER — Telehealth: Payer: Self-pay | Admitting: Internal Medicine

## 2020-08-26 DIAGNOSIS — J9611 Chronic respiratory failure with hypoxia: Secondary | ICD-10-CM
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
## 2020-08-26 NOTE — Telephone Encounter (Signed)
Spoke with the pt and notified of results/recs per Dr Chase Caller  He verbalized understanding  I have ordered the noct o2 and scheduled with BW for 08/29/20

## 2020-08-29 ENCOUNTER — Telehealth: Payer: Self-pay

## 2020-08-29 ENCOUNTER — Encounter: Payer: Self-pay | Admitting: Primary Care

## 2020-08-29 ENCOUNTER — Other Ambulatory Visit: Payer: Self-pay

## 2020-08-29 ENCOUNTER — Ambulatory Visit (INDEPENDENT_AMBULATORY_CARE_PROVIDER_SITE_OTHER): Payer: Medicare Other | Admitting: Primary Care

## 2020-08-29 VITALS — BP 134/70 | HR 92 | Temp 97.7°F | Ht 66.0 in | Wt 186.6 lb

## 2020-08-29 DIAGNOSIS — G4734 Idiopathic sleep related nonobstructive alveolar hypoventilation: Secondary | ICD-10-CM

## 2020-08-29 DIAGNOSIS — J9611 Chronic respiratory failure with hypoxia: Secondary | ICD-10-CM

## 2020-08-29 DIAGNOSIS — J849 Interstitial pulmonary disease, unspecified: Secondary | ICD-10-CM

## 2020-08-29 MED ORDER — ALBUTEROL SULFATE HFA 108 (90 BASE) MCG/ACT IN AERS: 1.0000 | INHALATION_SPRAY | Freq: Four times a day (QID) | RESPIRATORY_TRACT | 1 refills | Status: AC | PRN

## 2020-08-29 MED ORDER — OMEPRAZOLE 20 MG PO CPDR: 20.0000 mg | DELAYED_RELEASE_CAPSULE | Freq: Every day | ORAL | 3 refills | Status: DC

## 2020-08-29 NOTE — Telephone Encounter (Signed)
Prior Auth for Spiriva submitted on CoverMyMeds... pending determination  KEY: [id number]

## 2020-08-29 NOTE — Telephone Encounter (Signed)
Order has been placed for pt to receive [employer] continuous oxygen concentrator from [employer]. Nothing further needed.

## 2020-08-29 NOTE — Patient Instructions (Addendum)
High suspicion for idiopathic pulmonary fibrosis based on CT imaging results, age/gender, hx smoking and GERD Recommending starting antifibrotic to slow down progression of fibrosis  Please let us know if [employer] can provide you with an inogen oxygen concentrator, if not we will need to send order into company   Nocturnal hypoxia: - Need to wear 2L oxygen at night  Orders: - CMET in 4 weeks (ordered) - 6 minute walk test in 4 weeks  - Pulmonary rehab re: ILD   Follow-up: - Needs apt with pharmacist to review Esbriet medication in detail  - Jan 22nd with Dr. Roanna Epley    Pulmonary Fibrosis  Pulmonary fibrosis is a type of lung disease that causes scarring. Over time, the scar tissue builds up in the air sacs of your lungs (alveoli). This makes it hard for you to breathe. Less oxygen can get into your blood. Scarring from pulmonary fibrosis gets worse over time. This damage is permanent and may lead to other serious health problems. What are the causes? There are many different causes of pulmonary fibrosis. Sometimes the cause is not known. This is called idiopathic pulmonary fibrosis. Other causes include:  Exposure to chemicals and substances found in agricultural, farm, Architect, or factory work. These include mold, asbestos, silica, metal dusts, and toxic fumes.  Sarcoidosis. In this disease, areas of inflammatory cells (granulomas) form and most often affect the lungs.  Autoimmune diseases. These include diseases such as rheumatoid arthritis, systemic sclerosis, or connective tissue disease.  Taking certain medicines. These include drugs used in radiation therapy or used to treat seizures, heart problems, and some infections. What increases the risk? You are more likely to develop this condition if:  You have a family history of the disease.  You are older. The condition is more common in older adults.  You have a history of smoking.  You have a job that exposes you to  certain chemicals.  You have gastroesophageal reflux disease (GERD). What are the signs or symptoms? Symptoms of this condition include:  Difficulty breathing that gets worse with activity.  Shortness of breath (dyspnea).  Dry, hacking cough.  Rapid, shallow breathing during exercise or while at rest.  Bluish skin and lips.  Loss of appetite.  Weakness.  Weight loss and fatigue.  Rounded and enlarged fingertips (clubbing). How is this diagnosed? This condition may be diagnosed based on:  Your symptoms and medical history.  A physical exam. You may also have tests, including:  A test that involves looking inside your lungs with an instrument (bronchoscopy).  Imaging studies of your lungs and heart.  Tests to measure how well you are breathing (pulmonary function tests).  Blood tests.  Tests to see how well your lungs work while you are walking (pulmonary stress test).  A procedure to remove a lung tissue sample to look at it under a microscope (biopsy). How is this treated? There is no cure for pulmonary fibrosis. Treatment focuses on managing symptoms and preventing scarring from getting worse. This may include:  Medicines, such as: ? Steroids to prevent permanent lung changes. ? Medicines to suppress your body's defense system (immune system). ? Medicines to help with lung function by reducing inflammation or scarring.  Ongoing monitoring with X-rays and lab work.  Oxygen therapy.  Pulmonary rehabilitation.  Surgery. In some cases, a lung transplant is possible. Follow these instructions at home:     Medicines  Take over-the-counter and prescription medicines only as told by your health care provider.  Keep your vaccinations up to date as recommended by your health care provider. General instructions  Do not use any products that contain nicotine or tobacco, such as cigarettes and e-cigarettes. If you need help quitting, ask your health care  provider.  Get regular exercise, but do not overexert yourself. Ask your health care provider to suggest some activities that are safe for you to do. ? If you have physical limitations, you may get exercise by walking, using a stationary bike, or doing chair exercises. ? Ask your health care provider about using oxygen while exercising.  If you are exposed to chemicals and substances at work, make sure that you wear a mask or respirator at all times.  Join a pulmonary rehabilitation program or a support group for people with pulmonary fibrosis.  Eat small meals often so you do not get too full. Overeating can make breathing trouble worse.  Maintain a healthy weight. Lose weight if you need to.  Do breathing exercises as directed by your health care provider.  Keep all follow-up visits as told by your health care provider. This is important. Contact a health care provider if you:  Have symptoms that do not get better with medicines.  Are not able to be as active as usual.  Have trouble taking a deep breath.  Have a fever or chills.  Have blue lips or skin.  Have clubbing of your fingers. Get help right away if you:  Have a sudden worsening of your symptoms.  Have chest pain.  Cough up mucus that is dark in color.  Have a lot of headaches.  Get very confused or sleepy. Summary  Pulmonary fibrosis is a type of lung disease that causes scar tissue to build up in the air sacs of your lungs (alveoli) over time. Less oxygen can get into your blood. This makes it hard for you to breathe.  Scarring from pulmonary fibrosis gets worse over time. This damage is permanent and may lead to other serious health problems.  You are more likely to develop this condition if you have a family history of the condition or a job that exposes you to certain chemicals.  There is no cure for pulmonary fibrosis. Treatment focuses on managing symptoms and preventing scarring from getting  worse. This information is not intended to replace advice given to you by your health care provider. Make sure you discuss any questions you have with your health care provider. Document Revised: 10/02/2017 Document Reviewed: 10/02/2017 Elsevier Patient Education  [place].    Pirfenidone capsules or tablets What is this medicine? PIRFENIDONE (peer [place] nih done) is used to treat idiopathic pulmonary fibrosis. This medicine may be used for other purposes; ask your health care provider or pharmacist if you have questions. COMMON BRAND NAME(S): ESBRIET What should I tell my health care provider before I take this medicine? They need to know if you have any of these conditions:  kidney disease  liver disease  smoke tobacco  an unusual or allergic reaction to pirfenidone, or other medicines, foods, dyes, or preservatives  pregnant or trying to get pregnant  breast-feeding How should I use this medicine? Take this medicine by mouth with a glass of water. Follow the directions on the prescription label. Take this medicine with food. Take your medicine at regular intervals. Do not take it more often than directed. Do not stop taking except on your doctor's advice. Talk to your pediatrician regarding the use of the medicine in children.  Special care may be needed. Overdosage: If you think you have taken too much of this medicine contact a poison control center or emergency room at once. NOTE: This medicine is only for you. Do not share this medicine with others. What if I miss a dose? If you miss a dose, take it as soon as you can. If it is almost time for your next dose, take only that dose. Do not take double or extra doses. If you miss 14 or more days of taking the medicine, call your healthcare provider before starting again. What may interact with this medicine? This medicine may interact with the following medications:  certain antiinfectives like ciprofloxacin,  thiabendazole  certain medicines for asthma like zafirlukast, zileuton, montelukast  certain medicines for depression, anxiety, or psychotic disturbances  certain medicines for irregular heart beat like amiodarone, mexiletine  certain medicines for seizures like carbamazepine, phenytoin, fosphenytoin  disulfiram  stomach acid blockers like cimetidine  vemurafenib This list may not describe all possible interactions. Give your health care provider a list of all the medicines, herbs, non-prescription drugs, or dietary supplements you use. Also tell them if you smoke, drink alcohol, or use illegal drugs. Some items may interact with your medicine. What should I watch for while using this medicine? Tell your doctor or healthcare professional if your symptoms do not start to get better or if they get worse. If you smoke, tell your doctor if you notice this medicine is not working well for you. Talk to your doctor if you are a smoker or if you decide to stop smoking. This medicine can make you more sensitive to the sun. Keep out of the sun. If you cannot avoid being in the sun, wear protective clothing and use sunscreen. Do not use sun lamps or tanning beds/booths. What side effects may I notice from receiving this medicine? Side effects that you should report to your doctor or health care professional as soon as possible:  allergic reactions like skin rash, itching or hives, swelling of the face, lips, or tongue  signs and symptoms of liver injury like dark yellow or brown urine; general ill feeling or flu-like symptoms; light colored stools; loss of appetite; nausea; right upper belly pain; unusually weak or tired; yellowing of the eyes or skin  sunburn Side effects that usually do not require medical attention (report to your doctor or health care professional if they continue or are bothersome):  changes in taste  diarrhea  dizziness  headache  joint pain  nausea;  vomiting  stomach pain  trouble sleeping  weak or tired  weight loss This list may not describe all possible side effects. Call your doctor for medical advice about side effects. You may report side effects to FDA at 1-800-FDA-1088. Where should I keep my medicine? Keep out of the reach of children. Store at room temperature between 15 and 30 degrees C (59 and 86 degrees F). Throw away any unused medicine after the expiration date. NOTE: This sheet is a summary. It may not cover all possible information. If you have questions about this medicine, talk to your doctor, pharmacist, or health care provider.  2020 Elsevier/Gold Standard (2015-12-28 13:06:09)
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
## 2020-08-29 NOTE — Telephone Encounter (Signed)
Can you please send an order if for Inogen continuous oxygen concentrator for nocturnal hypoxia. Needs to wear 2L at night. They are wanting to purchase directly from [facility] and not rent Development worker, community from [place].

## 2020-08-29 NOTE — Telephone Encounter (Signed)
Per patient request routing to [facility]

## 2020-08-29 NOTE — Telephone Encounter (Signed)
Patient sent email  We talked to [facility]. They are unable to provide an Inogen At Home Oxygen concentrator. We told them to go ahead and deliver the back-up bottles, regulator and tubing/cannula supplies and to remove the Oxygen Concentrator from the delivery. Will you please generate an order for me to purchase an Inogen At [facility] and I will order one directly from them ASAP. Thank you for your interest and compassion and clarity today. Regards, Clelia Croft  Dr. Chase Caller please advise

## 2020-08-30 ENCOUNTER — Encounter: Payer: Self-pay | Admitting: Primary Care

## 2020-08-30 ENCOUNTER — Telehealth: Payer: Self-pay | Admitting: Primary Care

## 2020-08-30 DIAGNOSIS — G4734 Idiopathic sleep related nonobstructive alveolar hypoventilation: Secondary | ICD-10-CM | POA: Insufficient documentation

## 2020-08-30 DIAGNOSIS — J849 Interstitial pulmonary disease, unspecified: Secondary | ICD-10-CM | POA: Insufficient documentation

## 2020-08-30 NOTE — Telephone Encounter (Signed)
I did get information from his insurance card if helps   Changing medicare part D plans January 2022 New plan Kathryne Hitch [id number] [facility] meddadv Rx group [id number] ID [id number]

## 2020-08-30 NOTE — Telephone Encounter (Signed)
Will run eligibility check for new insurance after Jan 1 and initiate PA for [employer]. Will have patient fill out Esbriet paperwork at his pharmacy visit on 12/27.

## 2020-08-30 NOTE — Telephone Encounter (Signed)
Dr. Chase Caller is wanting to start patient on antibiotic for IPF. He has a hx of CAD, would recommend Esbriet. He has an apt with pharmacy on 09/05/20. I have not started RX process, he is changing insurances come Jan 1st, would likely want to send in prescription after that

## 2020-08-30 NOTE — Telephone Encounter (Signed)
Thank you :)

## 2020-08-30 NOTE — Assessment & Plan Note (Addendum)
-   Overnight oximetry test on12/2/21 which showed O2 <88% 5 hrs 35 mins,  start 2L [place] at night  - Patient is interested in Inogen continuous oxygen concentrator, local DME company does not carry. We have sent in RX in to [facility].

## 2020-08-30 NOTE — Assessment & Plan Note (Signed)
CT imaging suggestive of probable UIP with progression since 2015. High suspicion towards IPF d/t age, male gender, former smoker and hx GERD. He had negative serology.  - Dr. Chase Caller recommending staring anti-fibrotic, he has hx CAD prefer Esbriet  - He will be meeting with Pharmacy to discuss medication on 09/05/20, changing insurance Jan 1st 2022 to [place]. We will hold off until then to start benefits investigation/RX process - Discussed potential side effects with medication including weight loss and N/V/D. He had normal liver function testing in July 2021 and has no hx kidney disease. He can take imodium as needed for diarrhea. He will need to take anti-fibrotic medication with food. We will monitor LFTs q 4 weeks - Encourage patient to remain active, refer to pulmonary rehab  - Needs 6MWT at next visit to determine if he needs daytime oxygen  - Sent in RX for Omeprazole 20mg  daily for GERD symptoms likely contributing to cough - He has a followed scheduled January 20th with Dr. Chase Caller

## 2020-08-31 ENCOUNTER — Telehealth: Payer: Self-pay | Admitting: Internal Medicine

## 2020-08-31 DIAGNOSIS — J849 Interstitial pulmonary disease, unspecified: Secondary | ICD-10-CM

## 2020-08-31 NOTE — Telephone Encounter (Signed)
Dr. Chase Caller, I just put in a new order, the previous one was put under Geraldo Pitter.  Thank you.

## 2020-08-31 NOTE — Telephone Encounter (Signed)
Yes all set, I signed this morning

## 2020-08-31 NOTE — Progress Notes (Signed)
Serology is normal.

## 2020-08-31 NOTE — Telephone Encounter (Signed)
Please advise on patient mychart message  This is an Urgent message for [employer]. Good morning Beth, it turns out that I do want to go ahead and purchase my Inogen At Home continuous flow concentrator rather than attempt to get it via Medicare. The order Inogen was sent is apparently stamped. To complete my order, Inogen needs a human signature. Jill Alexanders has sent a one-page script form that I dropped off yesterday that should be on your [facility] desk. It has your name so just needs to be signed by you (plus circle the prescribed flow, nocturnal use, etc.) and faxed back to her. Apparently for my purchase they're required to get a human signature. And unlike Medicare can't accept a stamp. Thank you for your help.

## 2020-08-31 NOTE — Telephone Encounter (Signed)
MR please sign pulmonary rehab order.  Thanks!

## 2020-08-31 NOTE — Telephone Encounter (Signed)
Did not see an order to sign atlest 08/31/2020

## 2020-09-05 ENCOUNTER — Other Ambulatory Visit: Payer: Self-pay

## 2020-09-05 ENCOUNTER — Ambulatory Visit: Payer: Medicare Other | Admitting: Pharmacist

## 2020-09-05 DIAGNOSIS — J849 Interstitial pulmonary disease, unspecified: Secondary | ICD-10-CM

## 2020-09-05 NOTE — Progress Notes (Addendum)
Patient counseled on purpose, proper use, and potential adverse effects including nausea, vomiting, abdominal pain, GERD, weight loss, arthralgia, dizziness, and suns sensitivity/rash.  Stressed the importance of routine lab monitoring. Discussed need to monitor LFT's every month for the first 6 months of treatment then every 3 months as well as CBC every 3 months.  Starting dose will be Esbriet 267 mg 1 tablet three times daily for 7 days, then 2 tablets three times daily for 7 days, then 3 tablets three times daily.  Maintenance dose will be 801 mg 1 tablet three times daily if tolerated.  Stressed the importance of taking with meals and adequate protein intake to minimize stomach upset.    Patient already explored anticipated copay with new insurance - expects first month to be the highest d/t deductible, but copay would be ~$500/month once deductible is met. Patient will have new insurance on 09/10/20 when we'll start BIV - new insurance card copied. Will send to scan center.  He consented and signed [employer] PAP documents. He plans to f/u regarding income information for [facility] application. Will place provider portion in Dr. Golden Pop basket.  Knox Saliva, PharmD, MPH Clinical Pharmacist (Rheumatology and Pulmonology)

## 2020-09-06 ENCOUNTER — Telehealth: Payer: Self-pay | Admitting: Primary Care

## 2020-09-06 NOTE — Telephone Encounter (Signed)
Returned patient's call. Patient provided additional information needed for his patient assistance application. Nothing further needed.

## 2020-09-08 ENCOUNTER — Encounter (HOSPITAL_COMMUNITY): Payer: Self-pay | Admitting: *Deleted

## 2020-09-08 NOTE — Progress Notes (Signed)
Received referral from [facility] for this pt to participate in pulmonary rehab with the the diagnosis of ILD. Clinical review of pt follow up appt on 11/30 and 08/29/20 Pulmonary office note.  Pt with Covid Risk Score - 7. Pt appropriate for scheduling for Pulmonary rehab.  Will forward to support staff for scheduling and verification of insurance eligibility/benefits with pt consent. Alanson Aly, BSN Cardiac and Emergency planning/management officer

## 2020-09-12 NOTE — Telephone Encounter (Signed)
Submitted a Prior Authorization request to [facility] for ESBRIET via Cover My Meds. Will update once we receive a response.  Cover My Meds Key: [id number]

## 2020-09-12 NOTE — Telephone Encounter (Signed)
Ran test claim, patient's copay for 1st month supply is $2,547.78. Patient's Mendel Ryder forms were placed in provider's box for signature.

## 2020-09-12 NOTE — Telephone Encounter (Signed)
Received notification from [facility] regarding a prior authorization for ESBRIET. Authorization has been APPROVED from 09/10/20 to 09/09/21.   PA Case ID #  [id number] Phone # [phone]  Chesley Mires, PharmD, MPH Clinical Pharmacist (Rheumatology and Pulmonology)

## 2020-09-20 NOTE — Telephone Encounter (Signed)
Submitted Patient Assistance Application to [employer] for ESBRIET along with provider portion. Will update patient when we receive a response.  Fax# [phone] Phone# [phone]  

## 2020-09-21 ENCOUNTER — Telehealth (HOSPITAL_COMMUNITY): Payer: Self-pay

## 2020-09-21 NOTE — Telephone Encounter (Signed)
Pt insurance is active and benefits verified through Medicare a/b Co-pay 0, DED $233/0 met, out of pocket 0/0 met, co-insurance 20%. no pre-authorization required.   2ndary insurance is active and benefits verified through [place]. Co-pay 0, DED 0/0 met, out of pocket 0/0 met, co-insurance 0%. No pre-authorization required.

## 2020-09-22 ENCOUNTER — Ambulatory Visit (INDEPENDENT_AMBULATORY_CARE_PROVIDER_SITE_OTHER): Payer: Medicare Other

## 2020-09-22 ENCOUNTER — Other Ambulatory Visit: Payer: Self-pay

## 2020-09-22 DIAGNOSIS — J849 Interstitial pulmonary disease, unspecified: Secondary | ICD-10-CM

## 2020-09-22 LAB — COMPREHENSIVE METABOLIC PANEL
ALT: 22 U/L (ref 0–53)
AST: 23 U/L (ref 0–37)
Albumin: 4.3 g/dL (ref 3.5–5.2)
Alkaline Phosphatase: 50 U/L (ref 39–117)
BUN: 16 mg/dL (ref 6–23)
CO2: 27 mEq/L (ref 19–32)
Calcium: 9.1 mg/dL (ref 8.4–10.5)
Chloride: 104 mEq/L (ref 96–112)
Creatinine, Ser: 0.91 mg/dL (ref 0.40–1.50)
GFR: 83.15 mL/min (ref >60.00)
Glucose, Bld: 89 mg/dL (ref 70–99)
Potassium: 3.8 mEq/L (ref 3.5–5.1)
Sodium: 138 mEq/L (ref 135–145)
Total Bilirubin: 0.3 mg/dL (ref 0.2–1.2)
Total Protein: 7.2 g/dL (ref 6.0–8.3)

## 2020-09-22 NOTE — Progress Notes (Signed)
Six Minute Walk - 09/22/20 1523      Six Minute Walk   Medications taken before test (dose and time) allopurinol 100mg , alpha-lipoic acid 200mg , vitamin C, aspirin 81mg , coenzyme Q10 300mg , guaifenesin 1200mg , omega 3 fatty acids were all taken around 730am. Cosopt was taken around 930am. Omeprazole 20mg  was taken around 530am.    Supplemental oxygen during test? No    Lap distance in meters  34 meters    Laps Completed 14    Partial lap (in meters) 17 meters    Baseline BP (sitting) 128/84    Baseline Heartrate 77    Baseline Dyspnea (Borg Scale) 0.5    Baseline Fatigue (Borg Scale) 0    Baseline SPO2 97 %   on RA     End of Test Values    BP (sitting) 136/80    Heartrate 88    Dyspnea (Borg Scale) 2.5    Fatigue (Borg Scale) 1    SPO2 91 %   on RA     2 Minutes Post Walk Values   BP (sitting) 132/80    Heartrate 86    SPO2 97 %   on RA   Stopped or paused before six minutes? No      Interpretation   Distance completed 493 meters    Tech Comments: Patient was able to complete 71mw test at a steady pace. No O2 was needed during or after walk. Denied any chest pain,leg pain or weakness.

## 2020-09-27 NOTE — Telephone Encounter (Signed)
Called [employer] to f/u on Esbriet application. They have to complete internal investigation to ensure patient's copay. Will take 2-3 days to process. F/u later this week. Patient notified that we are still waiting (he had called earlier today to check on status). He has appt with Dr. Chase Caller this week on 09/29/20.  Knox Saliva, PharmD, MPH Clinical Pharmacist (Rheumatology and Pulmonology)

## 2020-09-29 ENCOUNTER — Other Ambulatory Visit: Payer: Self-pay

## 2020-09-29 ENCOUNTER — Encounter: Payer: Self-pay | Admitting: Internal Medicine

## 2020-09-29 ENCOUNTER — Ambulatory Visit (INDEPENDENT_AMBULATORY_CARE_PROVIDER_SITE_OTHER): Payer: Medicare Other | Admitting: Internal Medicine

## 2020-09-29 VITALS — BP 118/70 | HR 84 | Temp 97.3°F | Ht 66.0 in | Wt 180.2 lb

## 2020-09-29 DIAGNOSIS — J439 Emphysema, unspecified: Secondary | ICD-10-CM | POA: Diagnosis not present

## 2020-09-29 DIAGNOSIS — J84112 Idiopathic pulmonary fibrosis: Secondary | ICD-10-CM

## 2020-09-29 MED ORDER — PANTOPRAZOLE SODIUM 40 MG PO TBEC: 40.0000 mg | DELAYED_RELEASE_TABLET | Freq: Every day | ORAL | 3 refills | Status: DC

## 2020-09-29 NOTE — Patient Instructions (Addendum)
ICD-10-CM   1. IPF (idiopathic pulmonary fibrosis) ([place])  J84.112   2. Pulmonary emphysema, unspecified emphysema type ([place])  J43.9    -He was given the diagnosis in December 2021  [place] lung transplant program -to get plugged then -Change omeprazole to Protonix 40 mg once daily on empty stomach  -[facility] will send a prescription for this  -You do not qualify for oxygen through Medicare but we will do a prescription for you to get portable oxygen out-of-pocket via [employer]  - I will email the directed pulmonary rehabilitation to get an update on your referral to pulmonary rehabilitation -Continue weight loss -Ideal BMI is less than 27  -Nutritional program with calorie restriction/low carbohydrate diet/Mediterranean diet is the best way to lose weight -Continue using oxygen at night -Take informed consent form for research protocol PROMEDIOR and PLIANT studies  -We will talk more about this as time evolves and you are stabile printing on established antifibrotic's - email [email or web address] -Mr. Marlane Mingle to join the support group  -A good resource for pulmonary fibrosis is www.pulmonaryfibrosis.org which is the pulmonary fibrosis foundation -Continue Spiriva for the small component of emphysema -Exercise but monitor your oxygen levels and symptoms and ensure your safety -Start Esbriet as soon as approved by the [place] -6 weeks for a 30-minute visit with Dr. Chase Caller face-to-face

## 2020-09-29 NOTE — Progress Notes (Signed)
OV 08/09/2020  Subjective:  Patient ID: Reginald Davis, male , DOB: 04/14/1946 , age 75 y.o. , MRN: [id number] , ADDRESS: [place] [address] PCP [name], MD Patient Care Team: [name], MD as PCP - General (Internal Medicine) End, Harrell Gave, MD as PCP - Cardiology (Cardiology) Cardiologist is Dr. Ottie Glazier This Provider for this visit: Treatment Team:  Attending Provider: Brand Males, MD    08/09/2020 -   Chief Complaint  Patient presents with  . Consult    COPD, DOE     HPI Reginald Davis 75 y.o. -  retired academic an Therapist, sports for a [employer]  History is given by the patient who also brought in very clearly typed notes about his history.  He says in the 1970s and 1980s while he was busy with his professional life he was a smoker and finally quit smoking in 1997.  His total pack smoking history might be 70 years.  He undergoes regular screening CT scans of the chest.  Most recently March 2021 that reported signs of emphysema and a small upper lobe nodule unchanged over many years and possible interstitial lung disease changes.  He says he was at baseline and doing well but in April 2021 he started noticing shortness of breath.  He was placed on Spiriva which improved his symptoms daily.  After that he felt like significantly improved.  He then was dealing with significant difficult to control blood pressures with Dr. Meda Coffee.  She has increased his dosage of the blood pressure medication.  He was remaining active walking approximately thousand steps daily and 30 minutes of walking 5 more days per week.  Then in September 2020 when he and his wife traveled to [place] and then drove to [place] which is an elevation of 4000-4500 feet.  After that they continue to [facility] [place] feet.  They got very short of breath.  Even had a fever episode.  His pulse oximetry dropped to 84-85.  He ended up in the emergency room and  was given a diagnosis of COPD exacerbation.  Given doxycycline and prednisone and then he improved.  Post improvement in September and post return to [facility] he has had difficult to control blood pressure but he does have some residual shortness of breath with exertion relieved by rest.  His apple watch has noticed desaturations at night that are transient with pulse ox ranging 85 to 96%.  His resting heart rate at bed is between 54 and 90 at night.  His current walking desaturation test is below.  He dropped only 2 points but he did get tachycardic.  His pulmonary function test show slight reduction in DLCO but otherwise okay  He follows with Dr. Meda Coffee who noticed he has left lower lobe crackles.  Due to persistent symptoms he has been referred to pulmonary clinic.  He wants to get to the bottom of the issues.  His echocardiogram shows grade 1 diastolic dysfunction.  He does suffer from some visceral obesity.   Of note he has upcoming air travel to [place] on August 15, 2020.  He is going to see his granddaughter who is not seen since the onset of the pandemic.  He believes he may not be traveling to high altitudes in [place].   Simple office walk 185 feet x  3 laps goal with forehead probe 08/09/2020   O2 used ra  Number laps completed 3  Comments  about pace normal  Resting Pulse Ox/HR 96% and 80/min  Final Pulse Ox/HR 94% and 90/min  Desaturated </= 88% no  Desaturated <= 3% points no  Got Tachycardic >/= 90/min yes  Symptoms at end of test dyspnea  Miscellaneous comments x         PFT Results Latest Ref Rng & Units 07/12/2020  FVC-Pre L 3.36  FVC-Predicted Pre % 88  FVC-Post L 3.41  FVC-Predicted Post % 90  Pre FEV1/FVC % % 72  Post FEV1/FCV % % 74  FEV1-Pre L 2.41  FEV1-Predicted Pre % 88  FEV1-Post L 2.51  DLCO uncorrected ml/min/mmHg 16.88  DLCO UNC% % 73  DLCO corrected ml/min/mmHg 16.88  DLCO COR %Predicted % 73  DLVA Predicted % 80  TLC L 5.39  TLC %  Predicted % 83  RV % Predicted % 65    ADDENDUM: There is a right lower lobe nodule that is seen between vessels in the right lower lobe on image 97 of series 8. This nodule was compared to studies dating back to 2015 and shows no change. It is possible that this represents a lymph node along bronchovascular structures but again this is stable over a 6 year interval, obscured by surrounding vessels and bronchovascular structures.   Electronically Signed   By: Zetta Bills M.D.   On: 11/27/2019 14:45   Signed by Felipa Emory, MD on 11/27/2019 2:48 PM  Narrative & Impression  CLINICAL DATA:  Left upper lobe nodules, history of bronchiectasis and emphysema.  EXAM: CT CHEST WITHOUT CONTRAST  TECHNIQUE: Multidetector CT imaging of the chest was performed following the standard protocol without IV contrast.  COMPARISON:  Coronary CT from 08/26/2018, CT chest from 09/20/2017  FINDINGS: Cardiovascular: Calcified atherosclerotic changes in the thoracic aorta without signs of aneurysm. Limited assessment on noncontrast evaluation. The heart size is normal without pericardial effusion. Coronary artery disease with three-vessel calcification as before.  Mediastinum/Nodes: Thoracic inlet structures are normal. No axillary lymphadenopathy. Small right juxta hilar lymph node, 9 mm unchanged since January of 2019 along with other scattered lymph nodes below CT criteria for pathologic enlargement. Limited assessment of hilar structures due to lack of contrast. Esophagus is mildly patulous with some gas in the lumen.  Lungs/Pleura: Small nodule between bronchovascular structures in the central portion of the right lower lobe (image 97, series 8) 11 x 7 mm, within 1 mm of its size in 2019. No dense consolidation. Mild basilar bronchiectasis. Subpleural reticulation as before with signs of centrilobular emphysema.  Tiny nodules in the left upper lobe are  stable.  Upper Abdomen: Incidentally imaged portions of upper abdominal contents are unremarkable. No evidence of acute upper abdominal process.  Musculoskeletal: No chest wall mass. Sclerosis of the right clavicular head is unchanged since 2019. Spinal degenerative changes as before.  IMPRESSION: 1. Small nodule between bronchovascular structures in the central portion of the right lower lobe measuring 11 x 7 mm, within 1 mm of its size in 2019. This nodule is within 1 mm of its size in 2019. This nodule is unchanged since 2015 and this therefore benign. 2. Signs of emphysema and chronic interstitial changes with tiny upper lobe nodules are unchanged. 3. Coronary artery disease with three-vessel calcification.  Aortic Atherosclerosis (ICD10-I70.0) and Emphysema (ICD10-J43.9).  Electronically Signed: By: Zetta Bills M.D. On: 11/26/2019 09:11     08/29/2020- Interim hx Patient presents today to review testing and discuss starting anti-fiibrotic medication. CT imaging suggestive of probable UIP with  progression since 2015, high suspicion towards IPF. He had negative serology. Dr. Chase Caller recommend starting anti-fibrotic, either Esbriet or OFEV. Accompanied by his wife during today's appointment. He is baseline today, shortness of breath and cough are the same as last visit. He was recently in [place] for 10 days, he did not need to take on hand doxycyline or prednisone. He had an overnight oximetry test on12/2/21 which showed <88% 5 hrs 35 mins, start 2L [place]. He has not received oxygen yet, he was contacted by DME company. He is interested in [employer] continuous oxygen concentrator and is willing to pay out of pocket in needed. He reports daytime oxygen saturation runs between 87-92% RA. He is active, gets an average of 8,000 steps a day. He will be changing medicare part D plans to [employer] silver come January 1st 2022.     OV 09/29/2020  Subjective:  Patient ID: Reginald Davis, male , DOB: 03-30-1946 , age 22 y.o. , MRN: [id number] , ADDRESS: [place] [address] PCP [name], MD Patient Care Team: [name], MD as PCP - General (Internal Medicine) End, Harrell Gave, MD as PCP - Cardiology (Cardiology)  This Provider for this visit: Treatment Team:  Attending Provider: Brand Males, MD    09/29/2020 -   Chief Complaint  Patient presents with  . Follow-up    6 min walk performed 09/22/20.  Pt states he feels like he has been stable since last visit. Pt is wearing 2L at night.  Pt states if he is on an incline, his sats are dropping some below 88% on room air.    IPF dx givien 08/26/20 based on "probable UIP on CT + negative srology, +  age > 60, _ white + male + prior smoker + heart burn/GERD hx-> this is "IPF"  Associated mild emphysema present -started Spiriva  GERD hx  Pirfenidone start this pending January 2022/February 2022  HPI DIMETRIUS MONTFORT 75 y.o. -returns for follow-up.  He presents with his wife.  Since his last visit in the interim he saw nurse practitioner.  We gave him a diagnosis of IPF based on probable UIP on CT scan age greater than 30, Caucasian ethnicity, male gender, prior smoker, acid reflux history and negative serology.  He has accepted the diagnosis.  He presents with his wife at this point in time.  They have gone through pirfenidone education and started paperwork.  He is waiting for co-pay approval.  At this point in time he feels stable his symptom score is below.  He is completed a 6-minute walk test but he did not desaturate below 88%.  The focus of this visit is multiple questions in general education and answering questions about idiopathic pulmonary fibrosis  -Overall his cough is improved.  He attributes his cough improvement to omeprazole.  However he wants to switch to pantoprazole.  He did some reading and he said that he is concerned about theoretical effects of omeprazole and pirfenidone  plasma levels therefore he wants to switch to pantoprazole.  I have supported this and will do the prescription.  -He is very interested in clinical trials as a care option.  We discussed concept of clinical trials being voluntary and what care option meant.  He is probably leaning towards later phase trials.  We gave him consent form for PROMEDIOR Phase 3 and Pliant phase 2 studies but did indicate to him that there is currently a backlog and in addition he will have  to be stable on antifibrotic's for a few months before he can enroll.  He is understanding of this.  -He is interested in the patient support group and I have given him the email for a local support group to the pulmonary fibrosis foundation  -He exercises regularly.  However he is interested in joining pulmonary rehabilitation a referral has been made but he is still waiting to hear from them.  I have emailed the director to get a follow-up.  -We discussed the natural history of pulmonary fibrosis.  Especially IPF.  We discussed its progressive disease.  He and his wife wanted to know about prognostic markers.  Currently these are not commercially available.  In particular is interested in [place].  Indicated that in the next few years, she will test might be available to differentiate prognosis but at this time we would follow clinically.  Currently based on current severity and stability predicts future progression.  Also explained viral illnesses can exacerbate diseases  - Lung transplantation: He is interested in this concept.  He wants early referral to [facility] so he gets his options covered.  He knows he has to lose weight.  We discussed common barriers for lung transplantation including psychological Support, social support, financial costs.  He says he does not have any of this.  His BMI is 29 he needs to get to less than 27.  Explained to him nutrition is the way to go on this.  He is also going to do rehabilitation   - We  also discussed his emphysema component being small.  He will continue with Spiriva  = Oxygenation -he is using nocturnal oxygen.  His apple watch shows his oxygen levels to be normal now at night.  He is monitoring this.  He says that when he exerts heavily his oxygen does drop but on the 6-minute walk test he did not drop.  He will pay for a portable oxygen system on his own out-of-pocket.  Therefore we will do an order for this.  SYMPTOM SCALE - ILD 09/29/2020   O2 use ra  Shortness of Breath 0 -> 5 scale with 5 being worst (score 6 If unable to do)  At rest 0.5  Simple tasks - showers, clothes change, eating, shaving 1  Household (dishes, doing bed, laundry) 1  Shopping 1  Walking level at own pace 1.5  Walking up Stairs *2.25  Total (30-36) Dyspnea Score 7.25  How bad is your cough? Improved with ppi  How bad is your fatigue 0  How bad is nausea 0  How bad is vomiting?  0  How bad is diarrhea? 0.5 coffee  How bad is anxiety? 0.5  How bad is depression 0       CT Chest data  No results found.    PFT  PFT Results Latest Ref Rng & Units 07/12/2020  FVC-Pre L 3.36  FVC-Predicted Pre % 88  FVC-Post L 3.41  FVC-Predicted Post % 90  Pre FEV1/FVC % % 72  Post FEV1/FCV % % 74  FEV1-Pre L 2.41  FEV1-Predicted Pre % 88  FEV1-Post L 2.51  DLCO uncorrected ml/min/mmHg 16.88  DLCO UNC% % 73  DLCO corrected ml/min/mmHg 16.88  DLCO COR %Predicted % 73  DLVA Predicted % 80  TLC L 5.39  TLC % Predicted % 83  RV % Predicted % 65   CT chest 08/11/20   Lungs/Pleura: No pneumothorax. No pleural effusion. Mild centrilobular and paraseptal emphysema with mild diffuse bronchial  wall thickening. No acute consolidative airspace disease, lung masses or significant pulmonary nodules. No significant lobular air trapping or evidence of tracheobronchomalacia on the expiration sequence. There is patchy moderate subpleural reticulation and ground-glass opacity throughout both lungs  with associated mild-to-moderate traction bronchiectasis, architectural distortion and volume loss. There is a basilar predominance to these findings. Findings persist on prone sequence. No frank honeycombing. Findings have progressed since the baseline 05/20/2014 chest CT, without convincing progression since 11/26/2019 chest CT.  Upper abdomen: No acute abnormality.  Musculoskeletal: No aggressive appearing focal osseous lesions. Moderate thoracic spondylosis.  IMPRESSION: 1. Spectrum of findings compatible with basilar predominant fibrotic interstitial lung disease without frank honeycombing, progressed since baseline chest CT 05/20/2014 chest CT, without progression since 11/26/2019 chest CT. Findings are categorized as probable UIP per consensus guidelines: Diagnosis of Idiopathic Pulmonary Fibrosis: An Official ATS/ERS/JRS/ALAT Clinical Practice Guideline. [place], Iss 5, [phone], May 11 2017. 2. Three-vessel coronary atherosclerosis. 3. Aortic Atherosclerosis (ICD10-I70.0) and Emphysema (ICD10-J43.9).   Electronically Signed   By: Ilona Sorrel M.D.   On: 08/11/2020 12:29   has a past medical history of Burn, Coronary artery disease, Diverticul disease small and large intestine, no perforati or abscess, GERD (gastroesophageal reflux disease), Hernia, History of nuclear stress test, Hyperlipidemia, Low back pain, Oxygen toxicity, Plantar fasciitis, Retinal tear, Right rotator cuff tear, and Shingles.   reports that he quit smoking about 25 years ago. His smoking use included cigarettes. He has a 40.00 pack-year smoking history. He has never used smokeless tobacco.  Past Surgical History:  Procedure Laterality Date  . EYE SURGERY    . REFRACTIVE SURGERY     retina repair and retina tear  . SHOULDER ARTHROSCOPY    . SHOULDER SURGERY    . TONSILLECTOMY      Allergies  Allergen Reactions  . Ramipril Cough    PT REPORTS COUGH WHILE TAKING     Immunization History  Administered Date(s) Administered  . Influenza, High Dose Seasonal PF 06/07/2018, 06/10/2020  . Influenza-Unspecified 06/13/2014, 06/07/2018  . PFIZER(Purple Top)SARS-COV-2 Vaccination 10/05/2019, 10/27/2019, 06/03/2020  . Pneumococcal Conjugate-13 06/13/2014  . Pneumococcal-Unspecified 06/14/2011    Family History  Problem Relation Age of Onset  . Hyperlipidemia Mother   . Diabetes Father   . Heart disease Father   . Coronary artery disease Brother      Current Outpatient Medications:  .  Alirocumab (PRALUENT) 150 MG/ML SOAJ, Inject 150 mg as directed every 30 (thirty) days. , Disp: , Rfl:  .  allopurinol (ZYLOPRIM) 100 MG tablet, Take 1 tablet by mouth 2 (two) times daily., Disp: , Rfl:  .  Alpha-Lipoic Acid 200 MG CAPS, Take 1 capsule by mouth in the morning and at bedtime., Disp: , Rfl:  .  aspirin 81 MG tablet, Take 81 mg by mouth daily., Disp: , Rfl:  .  atorvastatin (LIPITOR) 20 MG tablet, Take 10 mg by mouth daily. , Disp: , Rfl:  .  Coenzyme Q10 300 MG CAPS, Take 1 capsule by mouth daily., Disp: , Rfl: 0 .  colchicine 0.6 MG tablet, Take 1 tablet by mouth 2 (two) times daily as needed (gout flare)., Disp: , Rfl:  .  desonide (DESOWEN) 0.05 % ointment, Apply 1 application topically 2 (two) times daily as needed., Disp: , Rfl:  .  dorzolamide-timolol (COSOPT) 22.3-6.8 MG/ML ophthalmic solution, Place 2 drops into the right eye daily. , Disp: , Rfl:  .  fluocinonide (LIDEX) 0.05 % external solution,  Apply 1 application topically as needed. , Disp: , Rfl:  .  Guaifenesin 1200 MG TB12, Take 1,200 mg by mouth 2 (two) times daily., Disp: , Rfl:  .  Multiple Vitamins-Minerals (CENTRUM SILVER PO), Take 1 tablet by mouth daily., Disp: , Rfl:  .  Omega-3 Fatty Acids (FISH OIL CONCENTRATE PO), Take 2,400 mg by mouth daily. , Disp: , Rfl:  .  pantoprazole (PROTONIX) 40 MG tablet, Take 1 tablet (40 mg total) by mouth daily., Disp: 90 tablet, Rfl: 3 .   prednisoLONE acetate (PRED FORTE) 1 % ophthalmic suspension, Place 1 drop into the right eye daily. , Disp: , Rfl:  .  tiotropium (SPIRIVA HANDIHALER) 18 MCG inhalation capsule, Place 1 capsule (18 mcg total) into inhaler and inhale daily., Disp: 30 capsule, Rfl: 12 .  valACYclovir (VALTREX) 1000 MG tablet, Take 1 tablet by mouth daily as needed., Disp: , Rfl:  .  valsartan (DIOVAN) 320 MG tablet, Take 1 tablet (320 mg total) by mouth daily., Disp: 90 tablet, Rfl: 2 .  vitamin C (ASCORBIC ACID) 500 MG tablet, Take 500 mg by mouth daily., Disp: , Rfl:  .  Zn-Pyg Afri-Nettle-Saw Palmet (SAW PALMETTO COMPLEX PO), Take 1 tablet by mouth daily., Disp: , Rfl:  .  albuterol (VENTOLIN HFA) 108 (90 Base) MCG/ACT inhaler, Inhale 1-2 puffs into the lungs every 6 (six) hours as needed for wheezing or shortness of breath. (Patient not taking: Reported on 09/29/2020), Disp: 18 g, Rfl: 1      Objective:   Vitals:   09/29/20 0900  BP: 118/70  Pulse: 84  Temp: (!) 97.3 F (36.3 C)  TempSrc: Other (Comment)  SpO2: 100%  Weight: 180 lb 3.2 oz (81.7 kg)  Height: 5\' 6"  (1.676 m)    Estimated body mass index is 29.09 kg/m as calculated from the following:   Height as of this encounter: 5\' 6"  (1.676 m).   Weight as of this encounter: 180 lb 3.2 oz (81.7 kg).  @WEIGHTCHANGE @  [employer]   09/29/20 0900  Weight: 180 lb 3.2 oz (81.7 kg)     Physical Exam   General: No distress. Looks well Neuro: Alert and Oriented x 3. GCS 15. Speech normal Psych: Pleasant Resp:  Barrel Chest - n.  Wheeze - no, Crackles - mayb, No overt respiratory distress CVS: Normal heart sounds. Murmurs - no Ext: Stigmata of Connective Tissue Disease - no HEENT: Normal upper airway. PEERL +. No post nasal drip        Assessment:       ICD-10-CM   1. IPF (idiopathic pulmonary fibrosis) ([place])  J84.112 Ambulatory referral to Pulmonology    Ambulatory Referral for DME  2. Pulmonary emphysema, unspecified emphysema  type ([place])  J43.9        Plan:     Patient Instructions     ICD-10-CM   1. IPF (idiopathic pulmonary fibrosis) ([place])  J84.112   2. Pulmonary emphysema, unspecified emphysema type ([place])  J43.9    -He was given the diagnosis in December 2021  [place] lung transplant program -to get plugged then -Change omeprazole to Protonix 40 mg once daily on empty stomach  -[facility] will send a prescription for this  -You do not qualify for oxygen through Medicare but we will do a prescription for you to get portable oxygen out-of-pocket via [employer]  - I will email the directed pulmonary rehabilitation to get an update on your referral to pulmonary rehabilitation -Continue weight loss -  Ideal BMI is less than 27  -Nutritional program with calorie restriction/low carbohydrate diet/Mediterranean diet is the best way to lose weight -Continue using oxygen at night -Take informed consent form for research protocol PROMEDIOR and PLIANT studies  -We will talk more about this as time evolves and you are stabile printing on established antifibrotic's - email [email or web address] .[email or web address] -Mr. Marlane Mingle to join the support group  -A good resource for pulmonary fibrosis is www.pulmonaryfibrosis.org which is the pulmonary fibrosis foundation -Continue Spiriva for the small component of emphysema -Exercise but monitor your oxygen levels and symptoms and ensure your safety -Start Esbriet as soon as approved by the [place] -6 weeks for a 30-minute visit with Dr. Chase Caller face-to-face   ( Level 05 visit: Estb 40-54 min   in  visit type: on-site physical face to visit  in total care time and counseling or/and coordination of care by this undersigned MD - Dr Brand Males. This includes one or more of the following on this same day 09/29/2020: pre-charting, chart review, note writing, documentation discussion of test results, diagnostic or treatment recommendations, prognosis, risks  and benefits of management options, instructions, education, compliance or risk-factor reduction. It excludes time spent by the [place] or office staff in the care of the patient. Actual time 30 min)   SIGNATURE    Dr. Brand Males, M.D., F.C.C.P,  Pulmonary and Critical Care Medicine Staff Physician, [place] Director - Interstitial Lung Disease  Program  Pulmonary [place] at [place], [place], [address]  Pager: [phone], If no answer or between  15:00h - 7:00h: call [phone] Telephone: [phone]  12:45 PM 09/29/2020

## 2020-09-30 NOTE — Telephone Encounter (Signed)
Still in investigation process.

## 2020-10-04 DIAGNOSIS — H35372 Puckering of macula, left eye: Secondary | ICD-10-CM | POA: Diagnosis not present

## 2020-10-04 DIAGNOSIS — H33312 Horseshoe tear of retina without detachment, left eye: Secondary | ICD-10-CM | POA: Diagnosis not present

## 2020-10-04 DIAGNOSIS — H35103 Retinopathy of prematurity, unspecified, bilateral: Secondary | ICD-10-CM | POA: Diagnosis not present

## 2020-10-05 NOTE — Telephone Encounter (Signed)
Still in investigation process.

## 2020-10-06 ENCOUNTER — Telehealth: Payer: Self-pay | Admitting: Pharmacist

## 2020-10-06 DIAGNOSIS — J849 Interstitial pulmonary disease, unspecified: Secondary | ICD-10-CM

## 2020-10-06 NOTE — Telephone Encounter (Signed)
Received call from patient and wife, Chrys Racer, today stating that they were approved for Esbriet through [facility]. Our office has not yet received fax confirmation but I confirmed approval with Celso Amy over the phone.  Esbriet counseling was completed in person on 09/05/20 with wife present.  We re-discussed potential adverse effects including nausea, vomiting, abdominal pain, GERD, weight loss, arthralgia, dizziness, and suns sensitivity/rash.  Stressed the importance of routine lab monitoring. Will monitor LFT's every month for the first 6 months of treatment then every 3 months. Will monitor CBC every 3 months. Standing lab orders placed for hepatic function and CBC.  Starting dose will be Esbriet 267 mg 1 tablet three times daily for 7 days, then 2 tablets three times daily for 7 days, then 3 tablets three times daily.  Maintenance dose will be 801 mg three times daily if tolerated. Patient states today that he does not prefer to take 1 tab (801 mg tab) vs 3 tabs (267 mg tab) - okay with taking 3 tabs three times daily.   Stressed the importance of taking with meals to minimize stomach upset.  Patient has [facility] phone number [phone]) and was advised to save phone numbers and look for these calls.  Patient sent MyChart message with walk-in lab information for [facility].  Knox Saliva, PharmD, MPH Clinical Pharmacist (Rheumatology and Pulmonology)

## 2020-10-06 NOTE — Telephone Encounter (Signed)
Notified by patient and confirmed by [place] that patient is successfully enrolled into [place] patient [place] for Esbriet.  Awaiting approval fax letter before sending Esbriet maintenance prescription to [facility].  Knox Saliva, PharmD, MPH Clinical Pharmacist (Rheumatology and Pulmonology)

## 2020-10-10 MED ORDER — PIRFENIDONE 267 MG PO TABS: ORAL_TABLET | ORAL | 0 refills | Status: DC

## 2020-10-10 MED ORDER — ESBRIET 801 MG PO TABS: 801.0000 mg | ORAL_TABLET | Freq: Three times a day (TID) | ORAL | 5 refills | Status: DC

## 2020-10-10 NOTE — Telephone Encounter (Signed)
Received a fax from  [place] regarding an approval for [place] patient assistance from 10/06/20 until further notice.   Phone number: [phone]

## 2020-10-10 NOTE — Addendum Note (Signed)
Addended by: Cassandria Anger on: 10/10/2020 04:39 PM   Modules accepted: Orders

## 2020-10-10 NOTE — Telephone Encounter (Addendum)
Rx for Esbriet maintenance dose (801mg  three times daily with meals) sent to [facility]. Rx for Esbriet titration dose entered into Epic as no-print.  Knox Saliva, PharmD, MPH Clinical Pharmacist (Rheumatology and Pulmonology)

## 2020-10-12 DIAGNOSIS — M5412 Radiculopathy, cervical region: Secondary | ICD-10-CM | POA: Diagnosis not present

## 2020-10-12 DIAGNOSIS — Z6828 Body mass index (BMI) 28.0-28.9, adult: Secondary | ICD-10-CM | POA: Diagnosis not present

## 2020-10-12 DIAGNOSIS — I10 Essential (primary) hypertension: Secondary | ICD-10-CM | POA: Diagnosis not present

## 2020-10-12 DIAGNOSIS — G5131 Clonic hemifacial spasm, right: Secondary | ICD-10-CM | POA: Diagnosis not present

## 2020-10-12 DIAGNOSIS — J84112 Idiopathic pulmonary fibrosis: Secondary | ICD-10-CM | POA: Diagnosis not present

## 2020-10-12 DIAGNOSIS — M542 Cervicalgia: Secondary | ICD-10-CM | POA: Diagnosis not present

## 2020-10-12 DIAGNOSIS — M47812 Spondylosis without myelopathy or radiculopathy, cervical region: Secondary | ICD-10-CM | POA: Diagnosis not present

## 2020-10-21 NOTE — Telephone Encounter (Signed)
Received call from Mr. Talerico today stating that he received medication bottle with 801mg  tablets. He has not received titration (Month 1) dose with 267mg  tablets. Called [facility] who state that maintenance rx was sent when they were processing titration dose - technical error on their end. I've requested expedited review of titration dose and sent to patient.   Called patient back and advised that they will be able to overnight the shipment. Will f/u on Tuesday to ensure shipment is scheduled.  Knox Saliva, PharmD, MPH Clinical Pharmacist (Rheumatology and Pulmonology)

## 2020-10-24 DIAGNOSIS — Z7682 Awaiting organ transplant status: Secondary | ICD-10-CM | POA: Diagnosis not present

## 2020-10-24 DIAGNOSIS — R918 Other nonspecific abnormal finding of lung field: Secondary | ICD-10-CM | POA: Diagnosis not present

## 2020-10-25 DIAGNOSIS — I358 Other nonrheumatic aortic valve disorders: Secondary | ICD-10-CM | POA: Diagnosis not present

## 2020-10-25 DIAGNOSIS — J441 Chronic obstructive pulmonary disease with (acute) exacerbation: Secondary | ICD-10-CM | POA: Diagnosis not present

## 2020-10-25 DIAGNOSIS — R0989 Other specified symptoms and signs involving the circulatory and respiratory systems: Secondary | ICD-10-CM | POA: Diagnosis not present

## 2020-10-25 DIAGNOSIS — Z87891 Personal history of nicotine dependence: Secondary | ICD-10-CM | POA: Diagnosis not present

## 2020-10-25 DIAGNOSIS — Z713 Dietary counseling and surveillance: Secondary | ICD-10-CM | POA: Diagnosis not present

## 2020-10-25 DIAGNOSIS — E669 Obesity, unspecified: Secondary | ICD-10-CM | POA: Diagnosis not present

## 2020-10-25 DIAGNOSIS — Z7682 Awaiting organ transplant status: Secondary | ICD-10-CM | POA: Diagnosis not present

## 2020-10-25 DIAGNOSIS — I517 Cardiomegaly: Secondary | ICD-10-CM | POA: Diagnosis not present

## 2020-10-25 DIAGNOSIS — J84112 Idiopathic pulmonary fibrosis: Secondary | ICD-10-CM | POA: Diagnosis not present

## 2020-10-25 DIAGNOSIS — Z6829 Body mass index (BMI) 29.0-29.9, adult: Secondary | ICD-10-CM | POA: Diagnosis not present

## 2020-10-26 ENCOUNTER — Telehealth (HOSPITAL_COMMUNITY): Payer: Self-pay | Admitting: Internal Medicine

## 2020-10-27 ENCOUNTER — Telehealth (HOSPITAL_COMMUNITY): Payer: Self-pay | Admitting: *Deleted

## 2020-10-31 ENCOUNTER — Encounter (HOSPITAL_COMMUNITY)
Admission: RE | Admit: 2020-10-31 | Discharge: 2020-10-31 | Disposition: A | Discharge status: Home / Self Care | Payer: Medicare Other | Source: Ambulatory Visit | Attending: Internal Medicine | Admitting: Internal Medicine

## 2020-10-31 ENCOUNTER — Other Ambulatory Visit: Payer: Self-pay

## 2020-10-31 VITALS — BP 132/68 | HR 71 | Ht 66.0 in | Wt 177.5 lb

## 2020-10-31 DIAGNOSIS — J849 Interstitial pulmonary disease, unspecified: Secondary | ICD-10-CM | POA: Insufficient documentation
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
## 2020-10-31 NOTE — Progress Notes (Signed)
Reginald Davis 75 y.o. male Pulmonary Rehab Orientation Note This patient who was referred to Pulmonary rehab by Dr. Chase Caller with the diagnosis of interstitial lung disease arrived today in Cardiac and Pulmonary Rehab. He arrived ambulatory with normal gait. He does not carry portable oxygen.  Per pt, he uses oxygen when he sleeps at night and occasionally when he is sedentary and his oxygen saturations drop below 88%. Color good, skin warm and dry. Patient is oriented to time and place. Patient's medical history, psychosocial health, and medications reviewed. Psychosocial assessment reveals pt lives with their spouse. Pt is currently retired. Pt hobbies include Research officer, trade union and gardening.  Pt does not exhibit signs of depression. PHQ2/9 score 0/0. Pt shows good  coping skills with positive outlook . Will continue to monitor and evaluate progress toward psychosocial goal(s) of continued mental wellbeing while participating in pulmonary rehab. Physical assessment reveals heart rate is normal, breath sounds clear to auscultation, no wheezes, rales, or rhonchi, with crackles in the bases. Grip strength equal, strong. Patient reports he does take medications as prescribed. Patient states he follows a Regular diet.  Patient's weight will be monitored closely. Demonstration and practice of PLB using pulse oximeter. Patient able to return demonstration satisfactorily. Safety and hand hygiene in the exercise area reviewed with patient. Patient voices understanding of the information reviewed. Department expectations discussed with patient and achievable goals were set. The patient shows enthusiasm about attending the program and we look forward to working with this nice gentleman. The patient did not complete a 6 minute walk test today, he brought his results from that test done @ [facility] on 10/24/2020 and to begin exercise on Tuesday, 11/08/2020 in the 10:00 am exercise slot.   [id number]

## 2020-11-08 ENCOUNTER — Other Ambulatory Visit: Payer: Self-pay

## 2020-11-08 ENCOUNTER — Ambulatory Visit (INDEPENDENT_AMBULATORY_CARE_PROVIDER_SITE_OTHER): Payer: Medicare Other | Admitting: Cardiology

## 2020-11-08 ENCOUNTER — Encounter (HOSPITAL_COMMUNITY)
Admission: RE | Admit: 2020-11-08 | Discharge: 2020-11-08 | Disposition: A | Discharge status: Home / Self Care | Payer: Medicare Other | Source: Ambulatory Visit | Attending: Internal Medicine | Admitting: Internal Medicine

## 2020-11-08 ENCOUNTER — Encounter: Payer: Self-pay | Admitting: Cardiology

## 2020-11-08 VITALS — BP 128/70 | HR 84 | Ht 66.0 in | Wt 175.2 lb

## 2020-11-08 DIAGNOSIS — E785 Hyperlipidemia, unspecified: Secondary | ICD-10-CM | POA: Diagnosis not present

## 2020-11-08 DIAGNOSIS — R0609 Other forms of dyspnea: Secondary | ICD-10-CM

## 2020-11-08 DIAGNOSIS — J849 Interstitial pulmonary disease, unspecified: Secondary | ICD-10-CM | POA: Diagnosis not present

## 2020-11-08 DIAGNOSIS — I10 Essential (primary) hypertension: Secondary | ICD-10-CM | POA: Diagnosis not present

## 2020-11-08 DIAGNOSIS — I2510 Atherosclerotic heart disease of native coronary artery without angina pectoris: Secondary | ICD-10-CM | POA: Diagnosis not present

## 2020-11-08 DIAGNOSIS — J84112 Idiopathic pulmonary fibrosis: Secondary | ICD-10-CM

## 2020-11-08 DIAGNOSIS — R0600 Dyspnea, unspecified: Secondary | ICD-10-CM | POA: Diagnosis not present

## 2020-11-08 NOTE — Patient Instructions (Signed)
Medication Instructions:  Your physician recommends that you continue on your current medications as directed. Please refer to the Current Medication list given to you today. *If you need a refill on your cardiac medications before your next appointment, please call your pharmacy*       Follow-Up: At [facility], you and your health needs are our priority.  As part of our continuing mission to provide you with exceptional heart care, we have created designated Provider Care Teams.  These Care Teams include your primary Cardiologist (physician) and Advanced Practice Providers (APPs -  Physician Assistants and Nurse Practitioners) who all work together to provide you with the care you need, when you need it.  We recommend signing up for the patient portal called "MyChart".  Sign up information is provided on this After Visit Summary.  MyChart is used to connect with patients for Virtual Visits (Telemedicine).  Patients are able to view lab/test results, encounter notes, upcoming appointments, etc.  Non-urgent messages can be sent to your provider as well.   To learn more about what you can do with MyChart, go to [email or web address].    Your next appointment:   6 month(s)  The format for your next appointment:   In Person  Provider:   You may see Dr. Gwyndolyn Kaufman or one of the following Advanced Practice Providers on your designated Care Team:    Richardson Dopp, PA-C  [place], [place]

## 2020-11-08 NOTE — Progress Notes (Signed)
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 discussion on how relationships can change when diagnosed with pulmonary disease. Common sexual concerns are reviewed.   MD DAY -A group question and answer session with a medical doctor that allows participants to ask questions that relate to their pulmonary disease state.   OTHER EDUCATION -Group or individual verbal, written, or video instructions that support the educational goals of the pulmonary rehab program.   Holiday Eating Survival Tips:  -Group instruction provided by PowerPoint slides, verbal discussion, and written materials to support subject matter. The instructor gives patients tips, tricks, and techniques to help them not only survive but enjoy the holidays despite the onslaught of  food that accompanies the holidays.   Knowledge Questionnaire Score:  Knowledge Questionnaire Score - 10/31/20 1020      Knowledge Questionnaire Score   Pre Score 17/18           Core Components/Risk Factors/Patient Goals at Admission:  Personal Goals and Risk Factors at Admission - 10/31/20 1020      Core Components/Risk Factors/Patient Goals on Admission   Improve shortness of breath with ADL's Yes    Intervention Provide education, individualized exercise plan and daily activity instruction to help decrease symptoms of SOB with activities of daily living.    Expected Outcomes Short Term: Improve cardiorespiratory fitness to achieve a reduction of symptoms when performing ADLs;Long Term:  Be able to perform more ADLs without symptoms or delay the onset of symptoms           Core Components/Risk Factors/Patient Goals Review:   Goals and Risk Factor Review    Row Name 10/31/20 1021 11/07/20 1356           Core Components/Risk Factors/Patient Goals Review   Personal Goals Review Develop more efficient breathing techniques such as purse lipped breathing and diaphragmatic breathing and practicing self-pacing with activity.;Increase knowledge of respiratory medications and ability to use respiratory devices properly.;Improve shortness of breath with ADL's Improve shortness of breath with ADL's;Increase knowledge of respiratory medications and ability to use respiratory devices properly.;Develop more efficient breathing techniques such as purse lipped breathing and diaphragmatic breathing and practicing self-pacing with activity.      Review -- Reginald Davis will begin exercising 11/08/2020, has not started yet.      Expected Outcomes -- See admission goals.             Core Components/Risk Factors/Patient Goals at Discharge (Final Review):   Goals and Risk Factor Review - 11/07/20 1356      Core Components/Risk Factors/Patient Goals Review   Personal Goals Review Improve shortness of  breath with ADL's;Increase knowledge of respiratory medications and ability to use respiratory devices properly.;Develop more efficient breathing techniques such as purse lipped breathing and diaphragmatic breathing and practicing self-pacing with activity.    Review Reginald Davis will begin exercising 11/08/2020, has not started yet.    Expected Outcomes See admission goals.           ITP Comments:   Comments: ITP REVIEW Pt is making expected progress toward pulmonary rehab goals after completing 0 sessions. Recommend continued exercise, life style modification, education, and utilization of breathing techniques to increase stamina and strength and decrease shortness of breath with exertion.

## 2020-11-08 NOTE — Progress Notes (Signed)
Daily Session Note  Patient Details  Name: Reginald Davis MRN: [id number] Date of Birth: 11/20/1945 Referring Provider:   Flowsheet Row Pulmonary Rehab Walk Test from 10/31/2020 in [facility]  Referring Provider Dr. Ramaswamy      Encounter Date: 11/08/2020  Check In:  Session Check In - 11/08/20 1007      Check-In   Supervising physician immediately available to respond to emergencies [facility] immediately available    Physician(s) Dr. Feliz    Location [facility]    Staff Present Joan Behrens, RN, BSN;Lisa Hughes, RN;Carlette Carlton, RN, BSN;Jessica Martin, MS, ACSM-CEP, Exercise Physiologist    Virtual Visit No    Medication changes reported     No    Fall or balance concerns reported    No    Tobacco Cessation No Change    Warm-up and Cool-down Performed on first and last piece of equipment    Resistance Training Performed Yes    VAD Patient? No    PAD/SET Patient? No      Pain Assessment   Currently in Pain? No/denies    Multiple Pain Sites No           Capillary Blood Glucose: No results found for this or any previous visit (from the past 24 hour(s)).    Social History   Tobacco Use  Smoking Status Former Smoker  . Packs/day: 2.00  . Years: 20.00  . Pack years: 40.00  . Types: Cigarettes  . Quit date: 1997  . Years since quitting: 25.1  Smokeless Tobacco Never Used    Goals Met:  Exercise tolerated well No report of cardiac concerns or symptoms Strength training completed today  Goals Unmet:  Not Applicable  Comments: Service time is from 0950 to 1125    Dr. Traci Turner is Medical Director for Cardiac Rehab at [facility]. 
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
## 2020-11-10 ENCOUNTER — Other Ambulatory Visit: Payer: Self-pay

## 2020-11-10 ENCOUNTER — Encounter (HOSPITAL_COMMUNITY)
Admission: RE | Admit: 2020-11-10 | Discharge: 2020-11-10 | Disposition: A | Discharge status: Home / Self Care | Payer: Medicare Other | Source: Ambulatory Visit | Attending: Internal Medicine | Admitting: Internal Medicine

## 2020-11-10 ENCOUNTER — Encounter: Payer: Self-pay | Admitting: Internal Medicine

## 2020-11-10 ENCOUNTER — Ambulatory Visit (INDEPENDENT_AMBULATORY_CARE_PROVIDER_SITE_OTHER): Payer: Medicare Other | Admitting: Internal Medicine

## 2020-11-10 VITALS — BP 120/62 | HR 71 | Temp 97.3°F | Ht 68.0 in | Wt 175.4 lb

## 2020-11-10 DIAGNOSIS — J849 Interstitial pulmonary disease, unspecified: Secondary | ICD-10-CM | POA: Diagnosis not present

## 2020-11-10 DIAGNOSIS — R0609 Other forms of dyspnea: Secondary | ICD-10-CM

## 2020-11-10 DIAGNOSIS — I2510 Atherosclerotic heart disease of native coronary artery without angina pectoris: Secondary | ICD-10-CM

## 2020-11-10 DIAGNOSIS — Z5181 Encounter for therapeutic drug level monitoring: Secondary | ICD-10-CM

## 2020-11-10 DIAGNOSIS — J439 Emphysema, unspecified: Secondary | ICD-10-CM

## 2020-11-10 DIAGNOSIS — J84112 Idiopathic pulmonary fibrosis: Secondary | ICD-10-CM | POA: Diagnosis not present

## 2020-11-10 DIAGNOSIS — R0600 Dyspnea, unspecified: Secondary | ICD-10-CM

## 2020-11-10 LAB — HEPATIC FUNCTION PANEL
ALT: 21 U/L (ref 0–53)
AST: 22 U/L (ref 0–37)
Albumin: 4.3 g/dL (ref 3.5–5.2)
Alkaline Phosphatase: 47 U/L (ref 39–117)
Bilirubin, Direct: 0.1 mg/dL (ref 0.0–0.3)
Total Bilirubin: 0.4 mg/dL (ref 0.2–1.2)
Total Protein: 7.2 g/dL (ref 6.0–8.3)

## 2020-11-10 LAB — CBC WITH DIFFERENTIAL/PLATELET
Basophils Absolute: 0.1 10*3/uL (ref 0.0–0.1)
Basophils Relative: 1.1 % (ref 0.0–3.0)
Eosinophils Absolute: 0.2 10*3/uL (ref 0.0–0.7)
Eosinophils Relative: 2.5 % (ref 0.0–5.0)
HCT: 43.3 % (ref 39.0–52.0)
Hemoglobin: 14.9 g/dL (ref 13.0–17.0)
Lymphocytes Relative: 23.3 % (ref 12.0–46.0)
Lymphs Abs: 2.1 10*3/uL (ref 0.7–4.0)
MCHC: 34.4 g/dL (ref 30.0–36.0)
MCV: 99.8 fl (ref 78.0–100.0)
Monocytes Absolute: 0.7 10*3/uL (ref 0.1–1.0)
Monocytes Relative: 8.2 % (ref 3.0–12.0)
Neutro Abs: 5.8 10*3/uL (ref 1.4–7.7)
Neutrophils Relative %: 64.9 % (ref 43.0–77.0)
Platelets: 289 10*3/uL (ref 150.0–400.0)
RBC: 4.34 Mil/uL (ref 4.22–5.81)
RDW: 13.6 % (ref 11.5–15.5)
WBC: 8.9 10*3/uL (ref 4.0–10.5)

## 2020-11-10 NOTE — Progress Notes (Signed)
OV 08/09/2020  Subjective:  Patient ID: Reginald Davis, male , DOB: 04/14/1946 , age 75 y.o. , MRN: [id number] , ADDRESS: [place] [address] PCP [name], MD Patient Care Team: [name], MD as PCP - General (Internal Medicine) End, Harrell Gave, MD as PCP - Cardiology (Cardiology) Cardiologist is Dr. Ottie Glazier This Provider for this visit: Treatment Team:  Attending Provider: Brand Males, MD    08/09/2020 -   Chief Complaint  Patient presents with  . Consult    COPD, DOE     HPI Reginald Davis 75 y.o. -  retired academic an Therapist, sports for a [employer]  History is given by the patient who also brought in very clearly typed notes about his history.  He says in the 1970s and 1980s while he was busy with his professional life he was a smoker and finally quit smoking in 1997.  His total pack smoking history might be 70 years.  He undergoes regular screening CT scans of the chest.  Most recently March 2021 that reported signs of emphysema and a small upper lobe nodule unchanged over many years and possible interstitial lung disease changes.  He says he was at baseline and doing well but in April 2021 he started noticing shortness of breath.  He was placed on Spiriva which improved his symptoms daily.  After that he felt like significantly improved.  He then was dealing with significant difficult to control blood pressures with Dr. Meda Coffee.  She has increased his dosage of the blood pressure medication.  He was remaining active walking approximately thousand steps daily and 30 minutes of walking 5 more days per week.  Then in September 2020 when he and his wife traveled to [place] and then drove to [place] which is an elevation of 4000-4500 feet.  After that they continue to [facility] at [place] feet.  They got very short of breath.  Even had a fever episode.  His pulse oximetry dropped to 84-85.  He ended up in the emergency room and  was given a diagnosis of COPD exacerbation.  Given doxycycline and prednisone and then he improved.  Post improvement in September and post return to [facility] he has had difficult to control blood pressure but he does have some residual shortness of breath with exertion relieved by rest.  His apple watch has noticed desaturations at night that are transient with pulse ox ranging 85 to 96%.  His resting heart rate at bed is between 54 and 90 at night.  His current walking desaturation test is below.  He dropped only 2 points but he did get tachycardic.  His pulmonary function test show slight reduction in DLCO but otherwise okay  He follows with Dr. Meda Coffee who noticed he has left lower lobe crackles.  Due to persistent symptoms he has been referred to pulmonary clinic.  He wants to get to the bottom of the issues.  His echocardiogram shows grade 1 diastolic dysfunction.  He does suffer from some visceral obesity.   Of note he has upcoming air travel to [place] on August 15, 2020.  He is going to see his granddaughter who is not seen since the onset of the pandemic.  He believes he may not be traveling to high altitudes in [place].   Simple office walk 185 feet x  3 laps goal with forehead probe 08/09/2020   O2 used ra  Number laps completed 3  Comments  about pace normal  Resting Pulse Ox/HR 96% and 80/min  Final Pulse Ox/HR 94% and 90/min  Desaturated </= 88% no  Desaturated <= 3% points no  Got Tachycardic >/= 90/min yes  Symptoms at end of test dyspnea  Miscellaneous comments x         PFT Results Latest Ref Rng & Units 07/12/2020  FVC-Pre L 3.36  FVC-Predicted Pre % 88  FVC-Post L 3.41  FVC-Predicted Post % 90  Pre FEV1/FVC % % 72  Post FEV1/FCV % % 74  FEV1-Pre L 2.41  FEV1-Predicted Pre % 88  FEV1-Post L 2.51  DLCO uncorrected ml/min/mmHg 16.88  DLCO UNC% % 73  DLCO corrected ml/min/mmHg 16.88  DLCO COR %Predicted % 73  DLVA Predicted % 80  TLC L 5.39  TLC %  Predicted % 83  RV % Predicted % 65    ADDENDUM: There is a right lower lobe nodule that is seen between vessels in the right lower lobe on image 97 of series 8. This nodule was compared to studies dating back to 2015 and shows no change. It is possible that this represents a lymph node along bronchovascular structures but again this is stable over a 6 year interval, obscured by surrounding vessels and bronchovascular structures.   Electronically Signed   By: Zetta Bills M.D.   On: 11/27/2019 14:45   Signed by Felipa Emory, MD on 11/27/2019 2:48 PM  Narrative & Impression  CLINICAL DATA:  Left upper lobe nodules, history of bronchiectasis and emphysema.  EXAM: CT CHEST WITHOUT CONTRAST  TECHNIQUE: Multidetector CT imaging of the chest was performed following the standard protocol without IV contrast.  COMPARISON:  Coronary CT from 08/26/2018, CT chest from 09/20/2017  FINDINGS: Cardiovascular: Calcified atherosclerotic changes in the thoracic aorta without signs of aneurysm. Limited assessment on noncontrast evaluation. The heart size is normal without pericardial effusion. Coronary artery disease with three-vessel calcification as before.  Mediastinum/Nodes: Thoracic inlet structures are normal. No axillary lymphadenopathy. Small right juxta hilar lymph node, 9 mm unchanged since January of 2019 along with other scattered lymph nodes below CT criteria for pathologic enlargement. Limited assessment of hilar structures due to lack of contrast. Esophagus is mildly patulous with some gas in the lumen.  Lungs/Pleura: Small nodule between bronchovascular structures in the central portion of the right lower lobe (image 97, series 8) 11 x 7 mm, within 1 mm of its size in 2019. No dense consolidation. Mild basilar bronchiectasis. Subpleural reticulation as before with signs of centrilobular emphysema.  Tiny nodules in the left upper lobe are  stable.  Upper Abdomen: Incidentally imaged portions of upper abdominal contents are unremarkable. No evidence of acute upper abdominal process.  Musculoskeletal: No chest wall mass. Sclerosis of the right clavicular head is unchanged since 2019. Spinal degenerative changes as before.  IMPRESSION: 1. Small nodule between bronchovascular structures in the central portion of the right lower lobe measuring 11 x 7 mm, within 1 mm of its size in 2019. This nodule is within 1 mm of its size in 2019. This nodule is unchanged since 2015 and this therefore benign. 2. Signs of emphysema and chronic interstitial changes with tiny upper lobe nodules are unchanged. 3. Coronary artery disease with three-vessel calcification.  Aortic Atherosclerosis (ICD10-I70.0) and Emphysema (ICD10-J43.9).  Electronically Signed: By: Zetta Bills M.D. On: 11/26/2019 09:11     08/29/2020- Interim hx Patient presents today to review testing and discuss starting anti-fiibrotic medication. CT imaging suggestive of probable UIP with  progression since 2015, high suspicion towards IPF. He had negative serology. Dr. Chase Caller recommend starting anti-fibrotic, either Esbriet or OFEV. Accompanied by his wife during today's appointment. He is baseline today, shortness of breath and cough are the same as last visit. He was recently in [place] for 10 days, he did not need to take on hand doxycyline or prednisone. He had an overnight oximetry test on12/2/21 which showed <88% 5 hrs 35 mins, start 2L [place]. He has not received oxygen yet, he was contacted by DME company. He is interested in [employer] continuous oxygen concentrator and is willing to pay out of pocket in needed. He reports daytime oxygen saturation runs between 87-92% RA. He is active, gets an average of 8,000 steps a day. He will be changing medicare part D plans to [employer] silver come January 1st 2022.     OV 09/29/2020  Subjective:  Patient ID: Reginald Davis, male , DOB: 03-30-1946 , age 22 y.o. , MRN: [id number] , ADDRESS: [place] [address] PCP [name], MD Patient Care Team: [name], MD as PCP - General (Internal Medicine) End, Harrell Gave, MD as PCP - Cardiology (Cardiology)  This Provider for this visit: Treatment Team:  Attending Provider: Brand Males, MD    09/29/2020 -   Chief Complaint  Patient presents with  . Follow-up    6 min walk performed 09/22/20.  Pt states he feels like he has been stable since last visit. Pt is wearing 2L at night.  Pt states if he is on an incline, his sats are dropping some below 88% on room air.    IPF dx givien 08/26/20 based on "probable UIP on CT + negative srology, +  age > 60, _ white + male + prior smoker + heart burn/GERD hx-> this is "IPF"  Associated mild emphysema present -started Spiriva  GERD hx  Pirfenidone start this pending January 2022/February 2022  HPI Reginald Davis 75 y.o. -returns for follow-up.  He presents with his wife.  Since his last visit in the interim he saw nurse practitioner.  We gave him a diagnosis of IPF based on probable UIP on CT scan age greater than 30, Caucasian ethnicity, male gender, prior smoker, acid reflux history and negative serology.  He has accepted the diagnosis.  He presents with his wife at this point in time.  They have gone through pirfenidone education and started paperwork.  He is waiting for co-pay approval.  At this point in time he feels stable his symptom score is below.  He is completed a 6-minute walk test but he did not desaturate below 88%.  The focus of this visit is multiple questions in general education and answering questions about idiopathic pulmonary fibrosis  -Overall his cough is improved.  He attributes his cough improvement to omeprazole.  However he wants to switch to pantoprazole.  He did some reading and he said that he is concerned about theoretical effects of omeprazole and pirfenidone  plasma levels therefore he wants to switch to pantoprazole.  I have supported this and will do the prescription.  -He is very interested in clinical trials as a care option.  We discussed concept of clinical trials being voluntary and what care option meant.  He is probably leaning towards later phase trials.  We gave him consent form for PROMEDIOR Phase 3 and Pliant phase 2 studies but did indicate to him that there is currently a backlog and in addition he will have  to be stable on antifibrotic's for a few months before he can enroll.  He is understanding of this.  -He is interested in the patient support group and I have given him the email for a local support group to the pulmonary fibrosis foundation  -He exercises regularly.  However he is interested in joining pulmonary rehabilitation a referral has been made but he is still waiting to hear from them.  I have emailed the director to get a follow-up.  -We discussed the natural history of pulmonary fibrosis.  Especially IPF.  We discussed its progressive disease.  He and his wife wanted to know about prognostic markers.  Currently these are not commercially available.  In particular is interested in [place].  Indicated that in the next few years, she will test might be available to differentiate prognosis but at this time we would follow clinically.  Currently based on current severity and stability predicts future progression.  Also explained viral illnesses can exacerbate diseases  - Lung transplantation: He is interested in this concept.  He wants early referral to [facility] so he gets his options covered.  He knows he has to lose weight.  We discussed common barriers for lung transplantation including psychological Support, social support, financial costs.  He says he does not have any of this.  His BMI is 29 he needs to get to less than 27.  Explained to him nutrition is the way to go on this.  He is also going to do rehabilitation   - We  also discussed his emphysema component being small.  He will continue with Spiriva  = Oxygenation -he is using nocturnal oxygen.  His apple watch shows his oxygen levels to be normal now at night.  He is monitoring this.  He says that when he exerts heavily his oxygen does drop but on the 6-minute walk test he did not drop.  He will pay for a portable oxygen system on his own out-of-pocket.  Therefore we will do an order for this.  SYMPTOM SCALE - ILD 09/29/2020   O2 use ra  Shortness of Breath 0 -> 5 scale with 5 being worst (score 6 If unable to do)  At rest 0.5  Simple tasks - showers, clothes change, eating, shaving 1  Household (dishes, doing bed, laundry) 1  Shopping 1  Walking level at own pace 1.5  Walking up Stairs *2.25  Total (30-36) Dyspnea Score 7.25  How bad is your cough? Improved with ppi  How bad is your fatigue 0  How bad is nausea 0  How bad is vomiting?  0  How bad is diarrhea? 0.5 coffee  How bad is anxiety? 0.5  How bad is depression 0       CT Chest data  No results found.    OV 11/10/2020  Subjective:  Patient ID: Reginald Davis, male , DOB: [birth year] , age 55 y.o. , MRN: [id number] , ADDRESS: [place] [address] PCP [name], MD Patient Care Team: [name], MD as PCP - General (Internal Medicine) End, Harrell Gave, MD as PCP - Cardiology (Cardiology)  This Provider for this visit: Treatment Team:  Attending Provider: Brand Males, MD    11/10/2020 -   Chief Complaint  Patient presents with  . Follow-up    Doing well    IPF dx givien 08/26/20 based on "probable UIP on CT + negative srology, +  age > 38, _ white + male + prior smoker +  heart burn/GERD hx-> this is "IPF"  -Last CT scan of the chest December 2021  - Esbriet start date -> Oct 26, 2020   - Pulm rehab Mar 2022   Associated mild emphysema present -started Spiriva    HPI Reginald Davis 75 y.o. -presents with his wife. This is for IPF follow-up.  He finally got hold of his pirfenidone. He started this 10/26/2020. So far he is tolerating it well. Earlier this week on 11/08/2020 he started pulmonary rehabilitation. He feels stable. Wife feels he is an under perceiver. On a 6-minute walk test he dropped to as low as 89%. He had a pulmonary function test at [facility] that showed decline in DLCO. He is worried about this. We walked him again today and his pulse ox did drop to 88%. That could have been a variation in place but clearly this a difference suggesting progression. However he is tolerating his pirfenidone fine. No side effects as evidenced below in the symptom score. He really wants to start ASAP on clinical trials. He is already met with [facility] for transplant as an option. He had to consent forms one for phase 3 IV infusion PRomedior and another for Phase 2 Pliant Part C. The latter has moved to Part D which is higher dose. He had more questions about this. We went over several components of clinical trials documented below. Explained to him about therapeutic misconception and secondary intent being therapeutic gain. Advised him that the primary intent should always be about drug development and contribution to science. He understood this. Explained to him currently there is supply chain issues with a phase 3 study. However the phase 2 sutdy is active. However he were to wait a few months before he is eligible for either study because he just started on pirfenidone. He understands this. We have given him an updated consent form.   1. Scientific Purpose  Clinical research is designed to produce generalizable knowledge and to answer questions about the safety and efficacy of intervention(s) under study in order to determine whether or not they may be useful for the care of future patients.  2. Study Procedures  Participation in a trial may involve procedures or tests, in addition to the intervention(s) under study, that are intended  only or primarily to generate scientific knowledge and that are otherwise not necessary for patient care.   3. Uncertainty  For intervention(s) under study in clinical research, there often is less knowledge and more uncertainty about the risks and benefits to a population of trial participants than there is when a doctor offers a patient standard interventions.   4. Adherence to Protocol  Administration of the intervention(s) under study is typically based on a strict protocol with defined dose, scheduling, and use or avoidance of concurrent medications, compared to administration of standard interventions.  5. Clinician as Investigator  Clinicians who are in health care settings provide treatment; in a clinical trial setting, they are also investigating safety and efficacy of an intervention. In otherwise your doctor or nurse practitioner can be wearing 2 hats - one as care giver another as Company secretary  6. Patient as Visual merchandiser Subject  Patients participating in research trials are research subjects or volunteers. In other words participating in research is 100% voluntary and at one's own free weill. The decision to participate or not participate will NOT affect patient care and the doctor-patient relationship in any way  7. Financial Conflict of Interest Disclosure  One or more of the investigators of  the research trial might have an investment interest in [facility] the clinical trials practice through which the study is being conducted. Study sponsor pays the practice for the conduct of the study.     SYMPTOM SCALE - ILD 09/29/2020  11/10/2020 Now on esbriet sinc 10/26/20  O2 use ra   Shortness of Breath 0 -> 5 scale with 5 being worst (score 6 If unable to do)   At rest 0.5 0  Simple tasks - showers, clothes change, eating, shaving 1 00  Household (dishes, doing bed, laundry) 1 0  Shopping 1 0  Walking level at own pace 1.5 1  Walking up Stairs *2.25 2   Total (30-36) Dyspnea Score 7.25 3  How bad is your cough? Improved with ppi 1  How bad is your fatigue 0 0  How bad is nausea 0 0  How bad is vomiting?  0 00  How bad is diarrhea? 0.5 coffee 0  How bad is anxiety? 0.5 0  How bad is depression 0 0    PFT  PFT Results Latest Ref Rng & Units 07/12/2020  FVC-Pre L 3.36  FVC-Predicted Pre % 88  FVC-Post L 3.41  FVC-Predicted Post % 90  Pre FEV1/FVC % % 72  Post FEV1/FCV % % 74  FEV1-Pre L 2.41  FEV1-Predicted Pre % 88  FEV1-Post L 2.51  DLCO uncorrected ml/min/mmHg 16.88  DLCO UNC% % 73  DLCO corrected ml/min/mmHg 16.88  DLCO COR %Predicted % 73  DLVA Predicted % 80  TLC L 5.39  TLC % Predicted % 83  RV % Predicted % 65   PFT [facility] feb 2022 FVC 3.55L DLCO 11.02   Walk test Simple office walk 185 feet x  3 laps goal with forehead probe 08/09/2020  11/10/2020 - [facility]md [facility] 89% and HR111. 1296 fet. 0L o2 needed. Pl JR 111/ done 10/24/20   O2 used ra ra  Number laps completed 3 3  Comments about pace normal nL  Resting Pulse Ox/HR 96% and 80/min 96% and 90.min  Final Pulse Ox/HR 94% and 90/min 88% and 97/min  Desaturated </= 88% no yes  Desaturated <= 3% points no tyes  Got Tachycardic >/= 90/min yes yes  Symptoms at end of test dyspnea Some dyspnea  Miscellaneous comments x    PFT [facility] feb 2022    has a past medical history of Burn, Coronary artery disease, Diverticul disease small and large intestine, no perforati or abscess, GERD (gastroesophageal reflux disease), Hernia, History of nuclear stress test, Hyperlipidemia, Low back pain, Oxygen toxicity, Plantar fasciitis, Retinal tear, Right rotator cuff tear, and Shingles.   reports that he quit smoking about 25 years ago. His smoking use included cigarettes. He has a 40.00 pack-year smoking history. He has never used smokeless tobacco.  Past Surgical History:  Procedure Laterality Date  . EYE SURGERY    . REFRACTIVE SURGERY     retina repair and retina tear   . SHOULDER ARTHROSCOPY    . SHOULDER SURGERY    . TONSILLECTOMY      No Active Allergies  Immunization History  Administered Date(s) Administered  . Influenza, High Dose Seasonal PF 06/07/2018, 06/10/2020  . Influenza-Unspecified 06/13/2014, 06/07/2018  . PFIZER(Purple Top)SARS-COV-2 Vaccination 10/05/2019, 10/27/2019, 06/03/2020  . Pneumococcal Conjugate-13 06/13/2014  . Pneumococcal-Unspecified 06/14/2011  . Zoster Recombinat (Shingrix) 01/13/2017, 04/26/2017    Family History  Problem Relation Age of Onset  . Hyperlipidemia Mother   .  Diabetes Father   . Heart disease Father   . Coronary artery disease Brother      Current Outpatient Medications:  .  albuterol (VENTOLIN HFA) 108 (90 Base) MCG/ACT inhaler, Inhale 1-2 puffs into the lungs every 6 (six) hours as needed for wheezing or shortness of breath., Disp: 18 g, Rfl: 1 .  Alirocumab (PRALUENT) 150 MG/ML SOAJ, Inject 150 mg as directed every 30 (thirty) days. , Disp: , Rfl:  .  allopurinol (ZYLOPRIM) 100 MG tablet, Take 1 tablet by mouth 2 (two) times daily., Disp: , Rfl:  .  Alpha-Lipoic Acid 200 MG CAPS, Take 1 capsule by mouth in the morning and at bedtime., Disp: , Rfl:  .  aspirin 81 MG tablet, Take 81 mg by mouth daily., Disp: , Rfl:  .  atorvastatin (LIPITOR) 20 MG tablet, Take 10 mg by mouth daily. , Disp: , Rfl:  .  ciprofloxacin (CIPRO) 500 MG tablet, Take 500 mg by mouth as directed., Disp: , Rfl:  .  Coenzyme Q10 300 MG CAPS, Take 1 capsule by mouth daily., Disp: , Rfl: 0 .  colchicine 0.6 MG tablet, Take 1 tablet by mouth 2 (two) times daily as needed (gout flare)., Disp: , Rfl:  .  desonide (DESOWEN) 0.05 % ointment, Apply 1 application topically 2 (two) times daily as needed., Disp: , Rfl:  .  dorzolamide-timolol (COSOPT) 22.3-6.8 MG/ML ophthalmic solution, Place 2 drops into the right eye daily. , Disp: , Rfl:  .  doxycycline (VIBRAMYCIN) 100 MG capsule, Take 100 mg by mouth as directed., Disp: , Rfl:  .   fluocinonide (LIDEX) 0.05 % external solution, Apply 1 application topically as needed., Disp: , Rfl:  .  Guaifenesin 1200 MG TB12, Take 1,200 mg by mouth 2 (two) times daily., Disp: , Rfl:  .  Multiple Vitamins-Minerals (CENTRUM SILVER PO), Take 1 tablet by mouth daily., Disp: , Rfl:  .  Omega-3 Fatty Acids (FISH OIL CONCENTRATE PO), Take 2,400 mg by mouth daily. , Disp: , Rfl:  .  pantoprazole (PROTONIX) 40 MG tablet, Take 1 tablet (40 mg total) by mouth daily., Disp: 90 tablet, Rfl: 3 .  Pirfenidone 267 MG TABS, Take 1 tab by mouth three times daily for 7 days, then 2 tab three times daily for 7 days, then take 3 tab three times daily thereafter as maintenance. Take with food, Disp: 207 tablet, Rfl: 0 .  prednisoLONE acetate (PRED FORTE) 1 % ophthalmic suspension, Place 1 drop into the right eye daily. , Disp: , Rfl:  .  predniSONE (DELTASONE) 10 MG tablet, Take 10 mg by mouth as directed., Disp: , Rfl:  .  tiotropium (SPIRIVA HANDIHALER) 18 MCG inhalation capsule, Place 1 capsule (18 mcg total) into inhaler and inhale daily., Disp: 30 capsule, Rfl: 12 .  valACYclovir (VALTREX) 1000 MG tablet, Take 1 tablet by mouth daily as needed., Disp: , Rfl:  .  valsartan (DIOVAN) 320 MG tablet, Take 1 tablet (320 mg total) by mouth daily., Disp: 90 tablet, Rfl: 2 .  vitamin C (ASCORBIC ACID) 500 MG tablet, Take 500 mg by mouth daily., Disp: , Rfl:  .  Zn-Pyg Afri-Nettle-Saw Palmet (SAW PALMETTO COMPLEX PO), Take 1 tablet by mouth daily., Disp: , Rfl:       Objective:   Vitals:   11/10/20 0900  BP: 120/62  Pulse: 71  Temp: (!) 97.3 F (36.3 C)  TempSrc: Oral  SpO2: 97%  Weight: 175 lb 6.4 oz (79.6 kg)  Height: _0  (  1.727 m)    Estimated body mass index is 26.67 kg/m as calculated from the following:   Height as of this encounter: _0  (1.727 m).   Weight as of this encounter: 175 lb 6.4 oz (79.6 kg).  _1 @  Filed Weights   11/10/20 0900  Weight: 175 lb 6.4 oz (79.6 kg)      Physical Exam   General: No distress. Looks wll Neuro: Alert and Oriented x 3. GCS 15. Speech normal Psych: Pleasant Resp:  Barrel Chest - no.  Wheeze - no, Crackles - yes, No overt respiratory distress CVS: Normal heart sounds. Murmurs - no Ext: Stigmata of Connective Tissue Disease - no HEENT: Normal upper airway. PEERL +. No post nasal drip        Assessment:       ICD-10-CM   1. IPF (idiopathic pulmonary fibrosis) ([place])  J84.112   2. Pulmonary emphysema, unspecified emphysema type ([place])  J43.9   3. Therapeutic drug monitoring  Z51.81        Plan:     Patient Instructions  IPF (idiopathic pulmonary fibrosis) ([place]) with associated acid reflux Encounter for therapeutic monitoring  -Normal echo May 2021 -He was given the diagnosis in December 2021 -Started pirfenidone mid February 20220 - Started pulmonary rehabilitation November 08, 2020 -Glad he saw [employer] pulmonary rehabilitation program -Appreciate your interest in clinical research trials as a care option   -Disease might be slowly declining (based on walk test and based on [facility] pulmonary function test but not based on his symptoms which are stable/improved]  Plan -Keep your follow-up with [facility] transplant program - Continue proton pump inhibitor -Continue portable oxygen as needed and also nighttime oxygen -Continue weight loss -Ideal BMI is less than 27 -Regarding clinical trials we discussed this in detail again  -We will have the research staff email you the latest updated consent form for PLIANT D -> we might start on this trial first as soon as you are eligible probably mid May 2022   - Did you email [email or web address] -Mr. Marlane Mingle to join the support group  -A good resource for pulmonary fibrosis is www.pulmonaryfibrosis.org which is the pulmonary fibrosis foundation -Check liver function test today again in 1 month -Repeat spirometry and DLCO at our institution as soon as possible  in the next few weeks  -To compare with [facility] to get a good sense of progression based on internal comparison  Pulmonary emphysema, unspecified emphysema type ([place])  -There is a mild component. Currently stable  Plan -Continue Spiriva daily  Follow-up -Do liver function test today and then repeat again in 1 month -Next few to several weeks do spirometry and DLCO at our institution -Research team will keep you on the database and reach out to you as soon as you meet prescreen criteria -Return to see Dr. Chase Caller for a 30-minute slot in 4 to 6 weeks but after completing spirometry and DLCO   ( Level 05 visit: Estb 40-54 min   in  visit type: on-site physical face to visit  in total care time and counseling or/and coordination of care by this undersigned MD - Dr Brand Males. This includes one or more of the following on this same day 11/10/2020: pre-charting, chart review, note writing, documentation discussion of test results, diagnostic or treatment recommendations, prognosis, risks and benefits of management options, instructions, education, compliance or risk-factor reduction. It excludes time spent by the [place] or office staff in the care of the patient. Actual  time 40 min)   SIGNATURE    Dr. Brand Males, M.D., F.C.C.P,  Pulmonary and Critical Care Medicine Staff Physician, [place] Director - Interstitial Lung Disease  Program  Pulmonary [place] at [place], [place], [address]  Pager: [phone], If no answer or between  15:00h - 7:00h: call [phone] Telephone: [phone]  9:42 AM 11/10/2020

## 2020-11-10 NOTE — Telephone Encounter (Signed)
Received the following message from patient:  I am aware that Dr. Chase Caller had some concerns about my recent numbers (and possibility of slow decline) when I saw him this morning at 9:00. I wanted to let him know about my O2 Sat experience at my subsequent Pulmonary Rehab Appointment following that AM visit at 11:00 this morning. "Walking track today room air saturation dropped to 84%. Pt had a fast pace. Placed O2 on at 2L saturation increased to 88%. Attempted room air on recumbent bike saturation dropped to 87% placed O2 2L on for rest of exercise session. Saturations 92-95% on O2." I'm told I'll be using oxygen during rehab now, or at least during portions of it. (This was a much more extensive walk with a five minute warm-up and then 15 minute brisk walk on level oval surface.) I am trying to learn to better recognize how to moderate and recognize my activity/oxygenation/breathlessness. My Pulmonary Function Test requested by Dr. Chase Caller is now set for March 18, and follow-up appointment with Dr. Chase Caller is set for April 13. Thank you.   Dr Chase Caller can you please advise.

## 2020-11-10 NOTE — Progress Notes (Signed)
Daily Session Note  Patient Details  Name: ITAMAR MCGOWAN MRN: [id number] Date of Birth: 1946-01-12 Referring Provider:   April Manson Pulmonary Rehab Walk Test from 10/31/2020 in [place]  Referring Provider Dr. Chase Caller      Encounter Date: 11/10/2020  Check In:  Session Check In - 11/10/20 [place]      Check-In   Supervising physician immediately available to respond to emergencies [facility] immediately available    Physician(s) Dr. Doristine Bosworth    Location [facility]    Staff Present Rosebud Poles, RN, BSN;Lisa Ysidro Evert, RN;Jessica Hassell Done, MS, ACSM-CEP, Exercise Physiologist    Virtual Visit No    Medication changes reported     No    Fall or balance concerns reported    No    Tobacco Cessation No Change    Warm-up and Cool-down Performed on first and last piece of equipment    Resistance Training Performed Yes    VAD Patient? No    PAD/SET Patient? No      Pain Assessment   Currently in Pain? No/denies    Multiple Pain Sites No           Capillary Blood Glucose: No results found for this or any previous visit (from the past 24 hour(s)).    Social History   Tobacco Use  Smoking Status Former Smoker  . Packs/day: 2.00  . Years: 20.00  . Pack years: 40.00  . Types: Cigarettes  . Quit date: 95  . Years since quitting: 25.1  Smokeless Tobacco Never Used    Goals Met:  Exercise tolerated well No report of cardiac concerns or symptoms Strength training completed today  Goals Unmet:  O2 Sat  Walking track today room air saturation dropped to 84%. Pt had a fast pace. Placed O2 on at 2L saturation increased to 88%. Attempted room air on recumbent bike saturation dropped to 87% placed O2 2L on for rest of exercise session. Saturations 92-95% on O2.  Comments: Service time is from 1050 to 1220    Dr. Fransico Him is Medical Director for Cardiac Rehab at [facility]

## 2020-11-10 NOTE — Addendum Note (Signed)
Addended by: Suzzanne Cloud E on: 11/10/2020 09:52 AM   Modules accepted: Orders

## 2020-11-10 NOTE — Patient Instructions (Addendum)
IPF (idiopathic pulmonary fibrosis) ([place]) with associated acid reflux Encounter for therapeutic monitoring  -Normal echo May 2021 -He was given the diagnosis in December 2021 -Started pirfenidone mid February 20220 - Started pulmonary rehabilitation November 08, 2020 -Glad he saw [facility] pulmonary rehabilitation program -Appreciate your interest in clinical research trials as a care option   -Disease might be slowly declining (based on walk test and based on [facility] pulmonary function test but not based on his symptoms which are stable/improved]  Plan -Keep your follow-up with [facility] transplant program - Continue proton pump inhibitor -Continue portable oxygen as needed and also nighttime oxygen -Continue weight loss -Ideal BMI is less than 27 -Regarding clinical trials we discussed this in detail again  -take he latest updated consent form for PLIANT D -> we might start on this trial first as soon as you are eligible probably mid May 2022   - Did you email [email or web address] -Mr. Marlane Mingle to join the support group  -A good resource for pulmonary fibrosis is www.pulmonaryfibrosis.org which is the pulmonary fibrosis foundation -Check liver function test today again in 1 month -Repeat spirometry and DLCO at our institution as soon as possible in the next few weeks  -To compare with [facility] to get a good sense of progression based on internal comparison  Pulmonary emphysema, unspecified emphysema type ([place])  -There is a mild component. Currently stable  Plan -Continue Spiriva daily  Follow-up -Do liver function test today and then repeat again in 1 month -Next few to several weeks do spirometry and DLCO at our institution -Research team will keep you on the database and reach out to you as soon as you meet prescreen criteria -Return to see Dr. Chase Caller for a 30-minute slot in 4 to 6 weeks but after completing spirometry and DLCO
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
## 2020-11-10 NOTE — Telephone Encounter (Signed)
I think it be helpful to know the March 18 pulmonary function test numbers.  Please let him know that I am leaving the country very in March 11-22 and so will be able to get him the results after I return.  At this point we want the pirfenidone to kick in and hopefully stabilize his lung functions from getting worse   The only other etiology I can think of with worsening desaturation other than worsening pulmonary fibrosis would be  pulmonary embolism to explain the drop in the diffusion.  The context is uncommon but it is possible   Plan -After giving some extra thought today and is updated message :given the fact that I am traveling soon and his breathing test is on March 18 if it is not much of an inconvenience you might like for him to come and check a D-dimer.  If this is positive we will have to do a scan for blood clot -apologize for inconvenience  -Of note please let him know his liver function test is normal today    Recent Labs  Lab 11/10/20 0952  AST 22  ALT 21  ALKPHOS 47  BILITOT 0.4  PROT 7.2  ALBUMIN 4.3       Current Outpatient Medications:  .  albuterol (VENTOLIN HFA) 108 (90 Base) MCG/ACT inhaler, Inhale 1-2 puffs into the lungs every 6 (six) hours as needed for wheezing or shortness of breath., Disp: 18 g, Rfl: 1 .  Alirocumab (PRALUENT) 150 MG/ML SOAJ, Inject 150 mg as directed every 30 (thirty) days. , Disp: , Rfl:  .  allopurinol (ZYLOPRIM) 100 MG tablet, Take 1 tablet by mouth 2 (two) times daily., Disp: , Rfl:  .  Alpha-Lipoic Acid 200 MG CAPS, Take 1 capsule by mouth in the morning and at bedtime., Disp: , Rfl:  .  aspirin 81 MG tablet, Take 81 mg by mouth daily., Disp: , Rfl:  .  atorvastatin (LIPITOR) 20 MG tablet, Take 10 mg by mouth daily. , Disp: , Rfl:  .  ciprofloxacin (CIPRO) 500 MG tablet, Take 500 mg by mouth as directed., Disp: , Rfl:  .  Coenzyme Q10 300 MG CAPS, Take 1 capsule by mouth daily., Disp: , Rfl: 0 .  colchicine 0.6 MG tablet, Take  1 tablet by mouth 2 (two) times daily as needed (gout flare)., Disp: , Rfl:  .  desonide (DESOWEN) 0.05 % ointment, Apply 1 application topically 2 (two) times daily as needed., Disp: , Rfl:  .  dorzolamide-timolol (COSOPT) 22.3-6.8 MG/ML ophthalmic solution, Place 2 drops into the right eye daily. , Disp: , Rfl:  .  doxycycline (VIBRAMYCIN) 100 MG capsule, Take 100 mg by mouth as directed., Disp: , Rfl:  .  fluocinonide (LIDEX) 0.05 % external solution, Apply 1 application topically as needed., Disp: , Rfl:  .  Guaifenesin 1200 MG TB12, Take 1,200 mg by mouth 2 (two) times daily., Disp: , Rfl:  .  Multiple Vitamins-Minerals (CENTRUM SILVER PO), Take 1 tablet by mouth daily., Disp: , Rfl:  .  Omega-3 Fatty Acids (FISH OIL CONCENTRATE PO), Take 2,400 mg by mouth daily. , Disp: , Rfl:  .  pantoprazole (PROTONIX) 40 MG tablet, Take 1 tablet (40 mg total) by mouth daily., Disp: 90 tablet, Rfl: 3 .  Pirfenidone 267 MG TABS, Take 1 tab by mouth three times daily for 7 days, then 2 tab three times daily for 7 days, then take 3 tab three times daily thereafter as maintenance. Take with  food, Disp: 207 tablet, Rfl: 0 .  prednisoLONE acetate (PRED FORTE) 1 % ophthalmic suspension, Place 1 drop into the right eye daily. , Disp: , Rfl:  .  predniSONE (DELTASONE) 10 MG tablet, Take 10 mg by mouth as directed., Disp: , Rfl:  .  tiotropium (SPIRIVA HANDIHALER) 18 MCG inhalation capsule, Place 1 capsule (18 mcg total) into inhaler and inhale daily., Disp: 30 capsule, Rfl: 12 .  valACYclovir (VALTREX) 1000 MG tablet, Take 1 tablet by mouth daily as needed., Disp: , Rfl:  .  valsartan (DIOVAN) 320 MG tablet, Take 1 tablet (320 mg total) by mouth daily., Disp: 90 tablet, Rfl: 2 .  vitamin C (ASCORBIC ACID) 500 MG tablet, Take 500 mg by mouth daily., Disp: , Rfl:  .  Zn-Pyg Afri-Nettle-Saw Palmet (SAW PALMETTO COMPLEX PO), Take 1 tablet by mouth daily., Disp: , Rfl:

## 2020-11-11 ENCOUNTER — Other Ambulatory Visit: Payer: Self-pay | Admitting: *Deleted

## 2020-11-11 ENCOUNTER — Telehealth: Payer: Self-pay | Admitting: *Deleted

## 2020-11-11 ENCOUNTER — Other Ambulatory Visit: Payer: Medicare Other

## 2020-11-11 DIAGNOSIS — J9611 Chronic respiratory failure with hypoxia: Secondary | ICD-10-CM

## 2020-11-11 DIAGNOSIS — R0609 Other forms of dyspnea: Secondary | ICD-10-CM

## 2020-11-11 DIAGNOSIS — R0600 Dyspnea, unspecified: Secondary | ICD-10-CM | POA: Diagnosis not present

## 2020-11-11 LAB — D-DIMER, QUANTITATIVE: D-Dimer, Quant: <0.19 mcg/mL FEU (ref <0.50)

## 2020-11-11 NOTE — Progress Notes (Signed)
Normal CBC , LFT

## 2020-11-11 NOTE — Progress Notes (Signed)
Called and left message on voicemail to please return phone call to go over results. Contact number provided. 

## 2020-11-11 NOTE — Telephone Encounter (Signed)
Pt aware of response per MR  I placed order for covid igg  Nothing further needed at this time

## 2020-11-11 NOTE — Telephone Encounter (Signed)
Not recommended because strains are changing and no evidence 4th works + I think ? [place] showed it does not add benefit to take 4th. IF wants to know he has immediate defense, when he comes to do d-dimer he can check IgG againstg covid. If negative, can refer for MAB Prophylaxis [place]

## 2020-11-11 NOTE — Telephone Encounter (Signed)
I have called and LM on VM for the pt 

## 2020-11-11 NOTE — Progress Notes (Signed)
D-dimer normal. So dobut PE/DVT D-dimer normal.

## 2020-11-11 NOTE — Progress Notes (Signed)
Patient returned phone call, writer went over lab results per Dr Chase Caller with patient. All questions answered and patient expressed full understanding. Nothing further needed at this time.

## 2020-11-11 NOTE — Telephone Encounter (Signed)
Dr. Lavell Anchors, The patient is asking about receiving a 4th pfizer vaccine, please advise.  Thank you.

## 2020-11-11 NOTE — Telephone Encounter (Signed)
Dr. Lavell Anchors, The patient is asking about receiving

## 2020-11-14 ENCOUNTER — Other Ambulatory Visit: Payer: Medicare Other

## 2020-11-14 DIAGNOSIS — J9611 Chronic respiratory failure with hypoxia: Secondary | ICD-10-CM

## 2020-11-14 LAB — SARS-COV-2 IGG: SARS-COV-2 IgG: 7.85

## 2020-11-14 NOTE — Addendum Note (Signed)
Addended by: Amado Coe on: 11/14/2020 10:47 AM   Modules accepted: Orders

## 2020-11-15 ENCOUNTER — Encounter (HOSPITAL_COMMUNITY)
Admission: RE | Admit: 2020-11-15 | Discharge: 2020-11-15 | Disposition: A | Discharge status: Home / Self Care | Payer: Medicare Other | Source: Ambulatory Visit | Attending: Internal Medicine | Admitting: Internal Medicine

## 2020-11-15 ENCOUNTER — Other Ambulatory Visit: Payer: Self-pay

## 2020-11-15 VITALS — Wt 176.4 lb

## 2020-11-15 DIAGNOSIS — J849 Interstitial pulmonary disease, unspecified: Secondary | ICD-10-CM

## 2020-11-15 NOTE — Progress Notes (Signed)
Daily Session Note  Patient Details  Name: Reginald Davis MRN: [id number] Date of Birth: 02-Dec-1945 Referring Provider:   April Manson Pulmonary Rehab Walk Test from 10/31/2020 in [place]  Referring Provider Dr. Chase Caller      Encounter Date: 11/15/2020  Check In:  Session Check In - 11/15/20 1136      Check-In   Supervising physician immediately available to respond to emergencies [facility] immediately available    Physician(s) Dr. Doristine Bosworth    Location [facility]    Staff Present Rosebud Poles, RN, BSN;Lisa Ysidro Evert, RN;Jessica Hassell Done, MS, ACSM-CEP, Exercise Physiologist    Virtual Visit No    Medication changes reported     No    Fall or balance concerns reported    No    Tobacco Cessation No Change    Warm-up and Cool-down Performed on first and last piece of equipment    Resistance Training Performed Yes    VAD Patient? No    PAD/SET Patient? No      Pain Assessment   Currently in Pain? No/denies    Multiple Pain Sites No           Capillary Blood Glucose: No results found for this or any previous visit (from the past 24 hour(s)).   Exercise Prescription Changes - 11/15/20 1100      Response to Exercise   Blood Pressure (Admit) 112/66    Blood Pressure (Exercise) 150/60    Blood Pressure (Exit) 120/64    Heart Rate (Admit) 69 bpm    Heart Rate (Exercise) 98 bpm    Heart Rate (Exit) 79 bpm    Oxygen Saturation (Admit) 97 %    Oxygen Saturation (Exercise) 88 %    Oxygen Saturation (Exit) 94 %    Rating of Perceived Exertion (Exercise) 12    Perceived Dyspnea (Exercise) 1.5    Duration Progress to 30 minutes of  aerobic without signs/symptoms of physical distress    Intensity --   40-80% HRR     Resistance Training   Training Prescription Yes    Weight blue bands    Reps 10-15    Time 10 Minutes      Recumbant Bike   Level 2.5    RPM 60    Minutes 15      Track   Laps 18    Minutes 15            Social History   Tobacco Use  Smoking Status Former Smoker  . Packs/day: 2.00  . Years: 20.00  . Pack years: 40.00  . Types: Cigarettes  . Quit date: 85  . Years since quitting: 25.1  Smokeless Tobacco Never Used    Goals Met:  Proper associated with RPD/PD & O2 Sat Exercise tolerated well Strength training completed today  Goals Unmet:  Not Applicable  Comments: Service time is from 0945 to [place]    Dr. Fransico Him is Medical Director for Cardiac Rehab at [facility].

## 2020-11-16 ENCOUNTER — Telehealth: Payer: Self-pay | Admitting: *Deleted

## 2020-11-16 DIAGNOSIS — R7301 Impaired fasting glucose: Secondary | ICD-10-CM | POA: Diagnosis not present

## 2020-11-16 DIAGNOSIS — M109 Gout, unspecified: Secondary | ICD-10-CM | POA: Diagnosis not present

## 2020-11-16 DIAGNOSIS — E785 Hyperlipidemia, unspecified: Secondary | ICD-10-CM | POA: Diagnosis not present

## 2020-11-16 DIAGNOSIS — Z125 Encounter for screening for malignant neoplasm of prostate: Secondary | ICD-10-CM | POA: Diagnosis not present

## 2020-11-16 NOTE — Telephone Encounter (Signed)
This pt is a former Dr. Meda Coffee pt.  He would like to establish care with Dr. Oval Linsey, if she agrees.  He recently saw Dr. Meda Coffee in the clinic.  His wife is also requesting to switch from Dr. Meda Coffee to Dr. Oval Linsey (separate telephone note sent about pts wife). Will route this message to both Dr. Oval Linsey and covering Nurse to further review and advise on MD switch.

## 2020-11-17 ENCOUNTER — Encounter (HOSPITAL_COMMUNITY)
Admission: RE | Admit: 2020-11-17 | Discharge: 2020-11-17 | Disposition: A | Discharge status: Home / Self Care | Payer: Medicare Other | Source: Ambulatory Visit | Attending: Internal Medicine | Admitting: Internal Medicine

## 2020-11-17 ENCOUNTER — Other Ambulatory Visit: Payer: Self-pay

## 2020-11-17 DIAGNOSIS — J849 Interstitial pulmonary disease, unspecified: Secondary | ICD-10-CM | POA: Diagnosis not present

## 2020-11-17 NOTE — Progress Notes (Signed)
Daily Session Note  Patient Details  Name: Reginald Davis MRN: [id number] Date of Birth: 08/20/1946 Referring Provider:   April Manson Pulmonary Rehab Walk Test from 10/31/2020 in [place]  Referring Provider Dr. Chase Caller      Encounter Date: 11/17/2020  Check In:  Session Check In - 11/17/20 0951      Check-In   Supervising physician immediately available to respond to emergencies [facility] immediately available    Physician(s) Dr. Arbutus Ped    Location [facility]    Staff Present Rosebud Poles, RN, BSN;Sharine Cadle Ysidro Evert, RN;Jessica Hassell Done, MS, ACSM-CEP, Exercise Physiologist    Virtual Visit No    Medication changes reported     No    Fall or balance concerns reported    No    Tobacco Cessation No Change    Warm-up and Cool-down Performed on first and last piece of equipment    Resistance Training Performed Yes    VAD Patient? No    PAD/SET Patient? No      Pain Assessment   Currently in Pain? No/denies    Multiple Pain Sites No           Capillary Blood Glucose: No results found for this or any previous visit (from the past 24 hour(s)).    Social History   Tobacco Use  Smoking Status Former Smoker  . Packs/day: 2.00  . Years: 20.00  . Pack years: 40.00  . Types: Cigarettes  . Quit date: 78  . Years since quitting: 25.2  Smokeless Tobacco Never Used    Goals Met:  Exercise tolerated well No report of cardiac concerns or symptoms Strength training completed today  Goals Unmet:  Not Applicable    Comments: Service time is from 0930 to 1050 Required use of oxygen 2 liter for the pace that patient wants to exercise.   Dr. Fransico Him is Medical Director for Cardiac Rehab at [facility].

## 2020-11-22 ENCOUNTER — Encounter (HOSPITAL_COMMUNITY)
Admission: RE | Admit: 2020-11-22 | Discharge: 2020-11-22 | Disposition: A | Discharge status: Home / Self Care | Payer: Medicare Other | Source: Ambulatory Visit | Attending: Internal Medicine | Admitting: Internal Medicine

## 2020-11-22 ENCOUNTER — Other Ambulatory Visit: Payer: Self-pay

## 2020-11-22 ENCOUNTER — Other Ambulatory Visit (HOSPITAL_COMMUNITY)
Admission: RE | Admit: 2020-11-22 | Discharge: 2020-11-22 | Disposition: A | Discharge status: Home / Self Care | Payer: Medicare Other | Source: Ambulatory Visit | Attending: Internal Medicine | Admitting: Internal Medicine

## 2020-11-22 DIAGNOSIS — Z01812 Encounter for preprocedural laboratory examination: Secondary | ICD-10-CM | POA: Insufficient documentation

## 2020-11-22 DIAGNOSIS — J849 Interstitial pulmonary disease, unspecified: Secondary | ICD-10-CM

## 2020-11-22 DIAGNOSIS — Z20822 Contact with and (suspected) exposure to covid-19: Secondary | ICD-10-CM | POA: Diagnosis not present

## 2020-11-22 LAB — SARS CORONAVIRUS 2 (TAT 6-24 HRS): SARS Coronavirus 2: NEGATIVE

## 2020-11-22 NOTE — Telephone Encounter (Signed)
Recall updated for this pt to see Dr. Oval Linsey in 6 months at our [place] location.

## 2020-11-22 NOTE — Telephone Encounter (Signed)
OK to both.  Thank you.

## 2020-11-22 NOTE — Progress Notes (Signed)
Daily Session Note  Patient Details  Name: Reginald Davis MRN: [id number] Date of Birth: 08-29-1946 Referring Provider:   April Manson Pulmonary Rehab Walk Test from 10/31/2020 in [place]  Referring Provider Dr. Chase Caller      Encounter Date: 11/22/2020  Check In:  Session Check In - 11/22/20 1040      Check-In   Supervising physician immediately available to respond to emergencies [facility] immediately available    Physician(s) Dr. Arbutus Ped    Location [facility]    Staff Present Rosebud Poles, RN, Isaac Laud, MS, ACSM-CEP, Exercise Physiologist;Carlette Wilber Oliphant, RN, BSN    Virtual Visit No    Medication changes reported     No    Fall or balance concerns reported    No    Tobacco Cessation No Change    Warm-up and Cool-down Performed on first and last piece of equipment    Resistance Training Performed Yes    VAD Patient? No    PAD/SET Patient? No      Pain Assessment   Currently in Pain? No/denies    Multiple Pain Sites No           Capillary Blood Glucose: No results found for this or any previous visit (from the past 24 hour(s)).    Social History   Tobacco Use  Smoking Status Former Smoker  . Packs/day: 2.00  . Years: 20.00  . Pack years: 40.00  . Types: Cigarettes  . Quit date: 9  . Years since quitting: 25.2  Smokeless Tobacco Never Used    Goals Met:  Proper associated with RPD/PD & O2 Sat Using PLB without cueing & demonstrates good technique Exercise tolerated well No report of cardiac concerns or symptoms Strength training completed today  Goals Unmet:  Not Applicable  Comments: Service time is from 0945 to 1100    Dr. Fransico Him is Medical Director for Cardiac Rehab at [facility]

## 2020-11-23 DIAGNOSIS — I447 Left bundle-branch block, unspecified: Secondary | ICD-10-CM | POA: Diagnosis not present

## 2020-11-23 DIAGNOSIS — R7301 Impaired fasting glucose: Secondary | ICD-10-CM | POA: Diagnosis not present

## 2020-11-23 DIAGNOSIS — Z1331 Encounter for screening for depression: Secondary | ICD-10-CM | POA: Diagnosis not present

## 2020-11-23 DIAGNOSIS — Z1212 Encounter for screening for malignant neoplasm of rectum: Secondary | ICD-10-CM | POA: Diagnosis not present

## 2020-11-23 DIAGNOSIS — E785 Hyperlipidemia, unspecified: Secondary | ICD-10-CM | POA: Diagnosis not present

## 2020-11-23 DIAGNOSIS — Z0000 Encounter for general adult medical examination without abnormal findings: Secondary | ICD-10-CM | POA: Diagnosis not present

## 2020-11-23 DIAGNOSIS — R82998 Other abnormal findings in urine: Secondary | ICD-10-CM | POA: Diagnosis not present

## 2020-11-23 DIAGNOSIS — J8410 Pulmonary fibrosis, unspecified: Secondary | ICD-10-CM | POA: Diagnosis not present

## 2020-11-23 DIAGNOSIS — I2583 Coronary atherosclerosis due to lipid rich plaque: Secondary | ICD-10-CM | POA: Diagnosis not present

## 2020-11-23 DIAGNOSIS — J439 Emphysema, unspecified: Secondary | ICD-10-CM | POA: Diagnosis not present

## 2020-11-23 DIAGNOSIS — R918 Other nonspecific abnormal finding of lung field: Secondary | ICD-10-CM | POA: Diagnosis not present

## 2020-11-23 DIAGNOSIS — K5790 Diverticulosis of intestine, part unspecified, without perforation or abscess without bleeding: Secondary | ICD-10-CM | POA: Diagnosis not present

## 2020-11-23 DIAGNOSIS — M10072 Idiopathic gout, left ankle and foot: Secondary | ICD-10-CM | POA: Diagnosis not present

## 2020-11-23 DIAGNOSIS — J209 Acute bronchitis, unspecified: Secondary | ICD-10-CM | POA: Diagnosis not present

## 2020-11-23 DIAGNOSIS — R972 Elevated prostate specific antigen [PSA]: Secondary | ICD-10-CM | POA: Diagnosis not present

## 2020-11-23 DIAGNOSIS — Z1339 Encounter for screening examination for other mental health and behavioral disorders: Secondary | ICD-10-CM | POA: Diagnosis not present

## 2020-11-24 ENCOUNTER — Other Ambulatory Visit: Payer: Self-pay

## 2020-11-24 ENCOUNTER — Encounter (HOSPITAL_COMMUNITY)
Admission: RE | Admit: 2020-11-24 | Discharge: 2020-11-24 | Disposition: A | Discharge status: Home / Self Care | Payer: Medicare Other | Source: Ambulatory Visit | Attending: Internal Medicine | Admitting: Internal Medicine

## 2020-11-24 DIAGNOSIS — J849 Interstitial pulmonary disease, unspecified: Secondary | ICD-10-CM

## 2020-11-24 NOTE — Progress Notes (Signed)
Daily Session Note  Patient Details  Name: Reginald Davis MRN: [id number] Date of Birth: 09-23-1945 Referring Provider:   April Manson Pulmonary Rehab Walk Test from 10/31/2020 in [place]  Referring Provider Dr. Chase Caller      Encounter Date: 11/24/2020  Check In:  Session Check In - 11/24/20 0945      Check-In   Supervising physician immediately available to respond to emergencies [facility] immediately available    Physician(s) Dr. Florencia Reasons    [facility]    Staff Present Rosebud Poles, RN, Isaac Laud, MS, ACSM-CEP, Exercise Physiologist;David Makemson, MS, EP-C, CCRP    Virtual Visit No    Medication changes reported     No    Fall or balance concerns reported    No    Tobacco Cessation No Change    Warm-up and Cool-down Performed on first and last piece of equipment    Resistance Training Performed Yes    VAD Patient? No    PAD/SET Patient? No      Pain Assessment   Currently in Pain? No/denies    Multiple Pain Sites No           Capillary Blood Glucose: No results found for this or any previous visit (from the past 24 hour(s)).    Social History   Tobacco Use  Smoking Status Former Smoker  . Packs/day: 2.00  . Years: 20.00  . Pack years: 40.00  . Types: Cigarettes  . Quit date: 97  . Years since quitting: 25.2  Smokeless Tobacco Never Used    Goals Met:  Proper associated with RPD/PD & O2 Sat Exercise tolerated well Strength training completed today  Goals Unmet:  Not Applicable  Comments: Service time is from 0930 to 1040.    Dr. Fransico Him is Medical Director for Cardiac Rehab at [facility]

## 2020-11-25 ENCOUNTER — Ambulatory Visit (INDEPENDENT_AMBULATORY_CARE_PROVIDER_SITE_OTHER): Payer: Medicare Other | Admitting: Internal Medicine

## 2020-11-25 DIAGNOSIS — J84112 Idiopathic pulmonary fibrosis: Secondary | ICD-10-CM

## 2020-11-25 DIAGNOSIS — J439 Emphysema, unspecified: Secondary | ICD-10-CM

## 2020-11-25 LAB — PULMONARY FUNCTION TEST
DL/VA % pred: 66 %
DL/VA: 2.71 ml/min/mmHg/L
DLCO cor % pred: 60 %
DLCO cor: 13.45 ml/min/mmHg
DLCO unc % pred: 60 %
DLCO unc: 13.56 ml/min/mmHg
FEF 25-75 Pre: 1.78 L/sec
FEF2575-%Pred-Pre: 93 %
FEV1-%Pred-Pre: 97 %
FEV1-Pre: 2.53 L
FEV1FVC-%Pred-Pre: 101 %
FEV6-%Pred-Pre: 101 %
FEV6-Pre: 3.42 L
FEV6FVC-%Pred-Pre: 106 %
FVC-%Pred-Pre: 94 %
FVC-Pre: 3.43 L
Pre FEV1/FVC ratio: 74 %
Pre FEV6/FVC Ratio: 100 %

## 2020-11-25 NOTE — Progress Notes (Signed)
Still has covid IgG titer. Currently no indication for 4th booster shot bu this can change with evidence change. I can address again at followup. We can consider repeat titers as well to aid deciscision making

## 2020-11-25 NOTE — Progress Notes (Signed)
Spirometry/DLCO performed today. 

## 2020-11-25 NOTE — Patient Instructions (Signed)
Spirometry and DLCO performed today. °

## 2020-11-29 ENCOUNTER — Other Ambulatory Visit: Payer: Self-pay

## 2020-11-29 ENCOUNTER — Encounter (HOSPITAL_COMMUNITY)
Admission: RE | Admit: 2020-11-29 | Discharge: 2020-11-29 | Disposition: A | Discharge status: Home / Self Care | Payer: Medicare Other | Source: Ambulatory Visit | Attending: Internal Medicine | Admitting: Internal Medicine

## 2020-11-29 VITALS — Wt 176.4 lb

## 2020-11-29 DIAGNOSIS — J849 Interstitial pulmonary disease, unspecified: Secondary | ICD-10-CM

## 2020-11-29 NOTE — Telephone Encounter (Signed)
Good afternoon, Patient is asking about the results of his PFT.  Please advise.

## 2020-11-29 NOTE — Progress Notes (Signed)
Daily Session Note  Patient Details  Name: Reginald Davis MRN: [id number] Date of Birth: 06/11/1946 Referring Provider:   April Manson Pulmonary Rehab Walk Test from 10/31/2020 in [place]  Referring Provider Dr. Chase Caller      Encounter Date: 11/29/2020  Check In:  Session Check In - 11/29/20 0945      Check-In   Supervising physician immediately available to respond to emergencies [facility] immediately available    Physician(s) Dr. Tawanna Solo    [facility]    Staff Present Rosebud Poles, RN, Isaac Laud, MS, ACSM-CEP, Exercise Physiologist;Lisa Ysidro Evert, RN    Virtual Visit No    Medication changes reported     No    Fall or balance concerns reported    No    Tobacco Cessation No Change    Warm-up and Cool-down Performed on first and last piece of equipment    Resistance Training Performed Yes    VAD Patient? No    PAD/SET Patient? No      Pain Assessment   Currently in Pain? No/denies    Multiple Pain Sites No           Capillary Blood Glucose: No results found for this or any previous visit (from the past 24 hour(s)).   Exercise Prescription Changes - 11/29/20 1100      Response to Exercise   Blood Pressure (Admit) 112/62    Blood Pressure (Exercise) 166/82    Blood Pressure (Exit) 124/70    Heart Rate (Admit) 68 bpm    Heart Rate (Exercise) 93 bpm    Heart Rate (Exit) 78 bpm    Oxygen Saturation (Admit) 96 %    Oxygen Saturation (Exercise) 92 %    Oxygen Saturation (Exit) 93 %    Rating of Perceived Exertion (Exercise) 12    Perceived Dyspnea (Exercise) 1    Duration Continue with 30 min of aerobic exercise without signs/symptoms of physical distress.    Intensity THRR unchanged      Progression   Progression Continue to progress workloads to maintain intensity without signs/symptoms of physical distress.      Resistance Training   Training Prescription Yes    Weight blue bands     Reps 10-15    Time 10 Minutes      Oxygen   Oxygen Intermittent   POC   Liters 3   POC     Recumbant Bike   Level 2.5    Minutes 15    METs 2.7      Track   Laps 19    Minutes 15    METs 3.2           Social History   Tobacco Use  Smoking Status Former Smoker  . Packs/day: 2.00  . Years: 20.00  . Pack years: 40.00  . Types: Cigarettes  . Quit date: 73  . Years since quitting: 25.2  Smokeless Tobacco Never Used    Goals Met:  Proper associated with RPD/PD & O2 Sat Independence with exercise equipment Using PLB without cueing & demonstrates good technique Exercise tolerated well Strength training completed today  Goals Unmet:  Not Applicable  Comments: Service time is from 0930 to 1040    Dr. Fransico Him is Medical Director for Cardiac Rehab at [facility].

## 2020-11-30 ENCOUNTER — Telehealth: Payer: Self-pay | Admitting: Internal Medicine

## 2020-11-30 NOTE — Telephone Encounter (Signed)
  There if fluctuatio in 2 key numbers with [place]   Nov 2021 -> March 2022 with [place]  - FVC up - dlco down  Compared to [facility] feb 2022  -March 2022 with  [place]  - FVC down  - DLCO up   Overall  - would stay stable  - there is mixed signal - nothing to do for now  - allow anti fibrotic kick in  - be patient   PFT Results Latest Ref Rng & Units 11/25/2020 07/12/2020  FVC-Pre L 3.43 3.36  FVC-Predicted Pre % 94 88  FVC-Post L - 3.41  FVC-Predicted Post % - 90  Pre FEV1/FVC % % 74 72  Post FEV1/FCV % % - 74  FEV1-Pre L 2.53 2.41  FEV1-Predicted Pre % 97 88  FEV1-Post L - 2.51  DLCO uncorrected ml/min/mmHg 13.56 16.88  DLCO UNC% % 60 73  DLCO corrected ml/min/mmHg 13.45 16.88  DLCO COR %Predicted % 60 73  DLVA Predicted % 66 80  TLC L - 5.39  TLC % Predicted % - 83  RV % Predicted % - 65

## 2020-12-01 ENCOUNTER — Other Ambulatory Visit: Payer: Self-pay

## 2020-12-01 ENCOUNTER — Encounter (HOSPITAL_COMMUNITY)
Admission: RE | Admit: 2020-12-01 | Discharge: 2020-12-01 | Disposition: A | Discharge status: Home / Self Care | Payer: Medicare Other | Source: Ambulatory Visit | Attending: Internal Medicine | Admitting: Internal Medicine

## 2020-12-01 DIAGNOSIS — J849 Interstitial pulmonary disease, unspecified: Secondary | ICD-10-CM | POA: Diagnosis not present

## 2020-12-01 NOTE — Telephone Encounter (Signed)
Called and spoke with pt letting him know the info stated by MR about the PFTs and he verbalized understanding. Nothing further needed.

## 2020-12-01 NOTE — Progress Notes (Signed)
Daily Session Note  Patient Details  Name: Reginald Davis MRN: [id number] Date of Birth: 01/31/1946 Referring Provider:   April Manson Pulmonary Rehab Walk Test from 10/31/2020 in [place]  Referring Provider Dr. Chase Caller      Encounter Date: 12/01/2020  Check In:  Session Check In - 12/01/20 1011      Check-In   Supervising physician immediately available to respond to emergencies [facility] immediately available    Physician(s) Dr. Tawanna Solo    Location [facility]    Staff Present Rosebud Poles, RN, BSN;Lisa Ysidro Evert, RN;Jessica Hassell Done, MS, ACSM-CEP, Exercise Physiologist    Virtual Visit No    Medication changes reported     No    Fall or balance concerns reported    No    Tobacco Cessation No Change    Warm-up and Cool-down Performed on first and last piece of equipment    Resistance Training Performed No    VAD Patient? No    PAD/SET Patient? No      Pain Assessment   Currently in Pain? No/denies    Multiple Pain Sites No           Capillary Blood Glucose: No results found for this or any previous visit (from the past 24 hour(s)).    Social History   Tobacco Use  Smoking Status Former Smoker  . Packs/day: 2.00  . Years: 20.00  . Pack years: 40.00  . Types: Cigarettes  . Quit date: 13  . Years since quitting: 25.2  Smokeless Tobacco Never Used    Goals Met:  Proper associated with RPD/PD & O2 Sat Exercise tolerated well Strength training completed today  Goals Unmet:  Not Applicable  Comments: Service time is from 0939  to 1045    Dr. Fransico Him is Medical Director for Cardiac Rehab at [facility].

## 2020-12-05 DIAGNOSIS — Z1211 Encounter for screening for malignant neoplasm of colon: Secondary | ICD-10-CM | POA: Diagnosis not present

## 2020-12-06 ENCOUNTER — Encounter (HOSPITAL_COMMUNITY)
Admission: RE | Admit: 2020-12-06 | Discharge: 2020-12-06 | Disposition: A | Discharge status: Home / Self Care | Payer: Medicare Other | Source: Ambulatory Visit | Attending: Internal Medicine | Admitting: Internal Medicine

## 2020-12-06 ENCOUNTER — Other Ambulatory Visit: Payer: Self-pay

## 2020-12-06 DIAGNOSIS — J849 Interstitial pulmonary disease, unspecified: Secondary | ICD-10-CM

## 2020-12-06 NOTE — Progress Notes (Signed)
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 necessary to achieve weight loss of 6-10 lb at graduation from pulmonary rehab.             Personal Goal #2 Re-Evaluation   Personal Goal #2 Pt to build a healthy plate including vegetables, fruits, whole grains, and low-fat dairy products in a heart healthy meal plan. Pt to build a healthy plate including vegetables, fruits, whole grains, and low-fat dairy products in a heart healthy meal plan.             Nutrition Goals Discharge (Final Nutrition Goals Re-Evaluation):  Nutrition Goals Re-Evaluation - 11/30/20 1510      Goals   Current Weight 176 lb 5.9 oz (80 kg)     Nutrition Goal Pt to identify food quantities necessary to achieve weight loss of 6-10 lb at graduation from pulmonary rehab.      Personal Goal #2 Re-Evaluation   Personal Goal #2 Pt to build a healthy plate including vegetables, fruits, whole grains, and low-fat dairy products in a heart healthy meal plan.           Psychosocial: Target Goals: Acknowledge presence or absence of significant depression and/or stress, maximize coping skills, provide positive support system. Participant is able to verbalize types and ability to use techniques and skills needed for reducing stress and depression.  Initial Review & Psychosocial Screening:  Initial Psych Review & Screening - 10/31/20 1019      Initial Review   Current issues with None Identified      Family Dynamics   Good Support System? Yes   wife     Barriers   Psychosocial barriers to participate in program There are no identifiable barriers or psychosocial needs.      Screening Interventions   Interventions Encouraged to exercise           Quality of Life Scores:  Scores of 19 and below usually indicate a poorer quality of life in these areas.  A difference of  2-3 points is a clinically meaningful difference.  A difference of 2-3 points in the total score of the Quality of Life Index has been associated with significant improvement in overall quality of life, self-image, physical symptoms, and general health in studies assessing change in quality of life.  PHQ-9: Recent Review Flowsheet Data    Depression screen [facility] 2/9 10/31/2020   Decreased Interest 0   Down, Depressed, Hopeless 0   PHQ - 2 Score 0   Altered sleeping 0   Tired, decreased energy 0   Change in appetite 0   Feeling bad or failure about yourself  0   Trouble concentrating 0   Moving slowly or fidgety/restless 0   Suicidal thoughts 0   Difficult doing work/chores Not difficult at all     Interpretation of Total Score  Total Score Depression Severity:   1-4 = Minimal depression, 5-9 = Mild depression, 10-14 = Moderate depression, 15-19 = Moderately severe depression, 20-27 = Severe depression   Psychosocial Evaluation and Intervention:  Psychosocial Evaluation - 10/31/20 1020      Psychosocial Evaluation & Interventions  Interventions Encouraged to exercise with the program and follow exercise prescription    Comments No concerns identified    Continue Psychosocial Services  No Follow up required           Psychosocial Re-Evaluation:  Psychosocial Re-Evaluation    [place] Name 11/07/20 1355 12/05/20 1114           Psychosocial Re-Evaluation   Current issues with None Identified None Identified      Comments No concerns identified No concerns identified at this time      Expected Outcomes For Deakon to continue to be free of psychosocial concerns while in pulmonary rehab. For Clevon to continue to be free of psychosocial concerns while participating in pulmonary rehab      Interventions Encouraged to attend Pulmonary Rehabilitation for the exercise Encouraged to attend Pulmonary Rehabilitation for the exercise      Continue Psychosocial Services  No Follow up required No Follow up required             Psychosocial Discharge (Final Psychosocial Re-Evaluation):  Psychosocial Re-Evaluation - 12/05/20 1114      Psychosocial Re-Evaluation   Current issues with None Identified    Comments No concerns identified at this time    Expected Outcomes For Tysean to continue to be free of psychosocial concerns while participating in pulmonary rehab    Interventions Encouraged to attend Pulmonary Rehabilitation for the exercise    Continue Psychosocial Services  No Follow up required           Education: Education Goals: Education classes will be provided on a weekly basis, covering required topics. Participant will state understanding/return demonstration of topics presented.  Learning Barriers/Preferences:  Learning  Barriers/Preferences - 10/31/20 1020      Learning Barriers/Preferences   Learning Barriers None    Learning Preferences Computer/Internet           Education Topics: Risk Factor Reduction:  -Group instruction that is supported by a PowerPoint presentation. Instructor discusses the definition of a risk factor, different risk factors for pulmonary disease, and how the heart and lungs work together.     Nutrition for Pulmonary Patient:  -Group instruction provided by PowerPoint slides, verbal discussion, and written materials to support subject matter. The instructor gives an explanation and review of healthy diet recommendations, which includes a discussion on weight management, recommendations for fruit and vegetable consumption, as well as protein, fluid, caffeine, fiber, sodium, sugar, and alcohol. Tips for eating when patients are short of breath are discussed.   Pursed Lip Breathing:  -Group instruction that is supported by demonstration and informational handouts. Instructor discusses the benefits of pursed lip and diaphragmatic breathing and detailed demonstration on how to preform both.     Oxygen Safety:  -Group instruction provided by PowerPoint, verbal discussion, and written material to support subject matter. There is an overview of "What is Oxygen" and "Why do we need it".  Instructor also reviews how to create a safe environment for oxygen use, the importance of using oxygen as prescribed, and the risks of noncompliance. There is a brief discussion on traveling with oxygen and resources the patient may utilize.   Oxygen Equipment:  -Group instruction provided by [facility] Staff utilizing handouts, written materials, and equipment demonstrations.   Signs and Symptoms:  -Group instruction provided by written material and verbal discussion to support subject matter. Warning signs and symptoms of infection, stroke, and heart attack are reviewed and when to call the  physician/911 reinforced. Tips  for preventing the spread of infection discussed.   Advanced Directives:  -Group instruction provided by verbal instruction and written material to support subject matter. Instructor reviews Advanced Directive laws and proper instruction for filling out document.   Pulmonary Video:  -Group video education that reviews the importance of medication and oxygen compliance, exercise, good nutrition, pulmonary hygiene, and pursed lip and diaphragmatic breathing for the pulmonary patient.   Exercise for the Pulmonary Patient:  -Group instruction that is supported by a PowerPoint presentation. Instructor discusses benefits of exercise, core components of exercise, frequency, duration, and intensity of an exercise routine, importance of utilizing pulse oximetry during exercise, safety while exercising, and options of places to exercise outside of rehab.     Pulmonary Medications:  -Verbally interactive group education provided by instructor with focus on inhaled medications and proper administration.   Anatomy and Physiology of the Respiratory System and Intimacy:  -Group instruction provided by PowerPoint, verbal discussion, and written material to support subject matter. Instructor reviews respiratory cycle and anatomical components of the respiratory system and their functions. Instructor also reviews differences in obstructive and restrictive respiratory diseases with examples of each. Intimacy, Sex, and Sexuality differences are reviewed with a discussion on how relationships can change when diagnosed with pulmonary disease. Common sexual concerns are reviewed. Flowsheet Row PULMONARY REHAB OTHER RESPIRATORY from 11/17/2020 in [place]  Date 11/17/20  Educator Handout      MD DAY -A group question and answer session with a medical doctor that allows participants to ask questions that relate to their pulmonary disease  state.   OTHER EDUCATION -Group or individual verbal, written, or video instructions that support the educational goals of the pulmonary rehab program.   Holiday Eating Survival Tips:  -Group instruction provided by PowerPoint slides, verbal discussion, and written materials to support subject matter. The instructor gives patients tips, tricks, and techniques to help them not only survive but enjoy the holidays despite the onslaught of food that accompanies the holidays.   Knowledge Questionnaire Score:  Knowledge Questionnaire Score - 10/31/20 1020      Knowledge Questionnaire Score   Pre Score 17/18           Core Components/Risk Factors/Patient Goals at Admission:  Personal Goals and Risk Factors at Admission - 10/31/20 1020      Core Components/Risk Factors/Patient Goals on Admission   Improve shortness of breath with ADL's Yes    Intervention Provide education, individualized exercise plan and daily activity instruction to help decrease symptoms of SOB with activities of daily living.    Expected Outcomes Short Term: Improve cardiorespiratory fitness to achieve a reduction of symptoms when performing ADLs;Long Term: Be able to perform more ADLs without symptoms or delay the onset of symptoms           Core Components/Risk Factors/Patient Goals Review:   Goals and Risk Factor Review    Row Name 10/31/20 1021 11/07/20 1356 12/05/20 1116         Core Components/Risk Factors/Patient Goals Review   Personal Goals Review Develop more efficient breathing techniques such as purse lipped breathing and diaphragmatic breathing and practicing self-pacing with activity.;Increase knowledge of respiratory medications and ability to use respiratory devices properly.;Improve shortness of breath with ADL's Improve shortness of breath with ADL's;Increase knowledge of respiratory medications and ability to use respiratory devices properly.;Develop more efficient breathing techniques such as  purse lipped breathing and diaphragmatic breathing and practicing self-pacing with activity. Develop more efficient breathing  techniques such as purse lipped breathing and diaphragmatic breathing and practicing self-pacing with activity.;Increase knowledge of respiratory medications and ability to use respiratory devices properly.;Improve shortness of breath with ADL's     Review -- Daymian will begin exercising 11/08/2020, has not started yet. Celester is exercising on 3L of pulsed supplemental oxygen which has allowed him to exercise at a higher level, he also sleeps with oxygen at night.     Expected Outcomes -- See admission goals. See admission goals.            Core Components/Risk Factors/Patient Goals at Discharge (Final Review):   Goals and Risk Factor Review - 12/05/20 1116      Core Components/Risk Factors/Patient Goals Review   Personal Goals Review Develop more efficient breathing techniques such as purse lipped breathing and diaphragmatic breathing and practicing self-pacing with activity.;Increase knowledge of respiratory medications and ability to use respiratory devices properly.;Improve shortness of breath with ADL's    Review Stephone is exercising on 3L of pulsed supplemental oxygen which has allowed him to exercise at a higher level, he also sleeps with oxygen at night.    Expected Outcomes See admission goals.           ITP Comments:   Comments: ITP REVIEW Pt is making expected progress toward pulmonary rehab goals after completing 8 sessions. Recommend continued exercise, life style modification, education, and utilization of breathing techniques to increase stamina and strength and decrease shortness of breath with exertion.

## 2020-12-06 NOTE — Progress Notes (Signed)
Daily Session Note  Patient Details  Name: Reginald Davis MRN: [id number] Date of Birth: 27-Nov-1945 Referring Provider:   April Manson Pulmonary Rehab Walk Test from 10/31/2020 in [place]  Referring Provider Dr. Chase Caller      Encounter Date: 12/06/2020  Check In:  Session Check In - 12/06/20 0956      Check-In   Supervising physician immediately available to respond to emergencies [facility] immediately available    Physician(s) Dr. Tawanna Solo    Location [facility]    Staff Present Rosebud Poles, RN, BSN;Gladie Gravette Ysidro Evert, RN;Jessica Hassell Done, MS, ACSM-CEP, Exercise Physiologist    Virtual Visit No    Medication changes reported     No    Fall or balance concerns reported    No    Tobacco Cessation No Change    Warm-up and Cool-down Performed on first and last piece of equipment    Resistance Training Performed No    VAD Patient? No    PAD/SET Patient? No      Pain Assessment   Currently in Pain? No/denies    Multiple Pain Sites No           Capillary Blood Glucose: No results found for this or any previous visit (from the past 24 hour(s)).    Social History   Tobacco Use  Smoking Status Former Smoker  . Packs/day: 2.00  . Years: 20.00  . Pack years: 40.00  . Types: Cigarettes  . Quit date: 10  . Years since quitting: 25.2  Smokeless Tobacco Never Used    Goals Met:  Exercise tolerated well No report of cardiac concerns or symptoms Strength training completed today  Goals Unmet:  Not Applicable  Comments: Service time is from 0935 to 1035    Dr. Fransico Him is Medical Director for Cardiac Rehab at [facility].

## 2020-12-07 DIAGNOSIS — R351 Nocturia: Secondary | ICD-10-CM | POA: Diagnosis not present

## 2020-12-07 DIAGNOSIS — N401 Enlarged prostate with lower urinary tract symptoms: Secondary | ICD-10-CM | POA: Diagnosis not present

## 2020-12-07 DIAGNOSIS — R972 Elevated prostate specific antigen [PSA]: Secondary | ICD-10-CM | POA: Diagnosis not present

## 2020-12-08 ENCOUNTER — Other Ambulatory Visit: Payer: Self-pay

## 2020-12-08 ENCOUNTER — Encounter (HOSPITAL_COMMUNITY)
Admission: RE | Admit: 2020-12-08 | Discharge: 2020-12-08 | Disposition: A | Discharge status: Home / Self Care | Payer: Medicare Other | Source: Ambulatory Visit | Attending: Internal Medicine | Admitting: Internal Medicine

## 2020-12-08 DIAGNOSIS — J849 Interstitial pulmonary disease, unspecified: Secondary | ICD-10-CM

## 2020-12-08 NOTE — Progress Notes (Signed)
Daily Session Note  Patient Details  Name: Reginald Davis MRN: [id number] Date of Birth: January 02, 1946 Referring Provider:   April Manson Pulmonary Rehab Walk Test from 10/31/2020 in [place]  Referring Provider Dr. Chase Caller      Encounter Date: 12/08/2020  Check In:  Session Check In - 12/08/20 0946      Check-In   Supervising physician immediately available to respond to emergencies [facility] immediately available    Physician(s) Dr. Florencia Reasons    [facility]    Staff Present Rosebud Poles, RN, BSN;Lisa Ysidro Evert, RN;Beryle Bagsby Hassell Done, MS, ACSM-CEP, Exercise Physiologist    Virtual Visit No    Medication changes reported     No    Fall or balance concerns reported    No    Tobacco Cessation No Change    Warm-up and Cool-down Performed on first and last piece of equipment    Resistance Training Performed Yes    VAD Patient? No    PAD/SET Patient? No      Pain Assessment   Currently in Pain? No/denies    Multiple Pain Sites No           Capillary Blood Glucose: No results found for this or any previous visit (from the past 24 hour(s)).    Social History   Tobacco Use  Smoking Status Former Smoker  . Packs/day: 2.00  . Years: 20.00  . Pack years: 40.00  . Types: Cigarettes  . Quit date: 59  . Years since quitting: 25.2  Smokeless Tobacco Never Used    Goals Met:  Proper associated with RPD/PD & O2 Sat Independence with exercise equipment Exercise tolerated well No report of cardiac concerns or symptoms Strength training completed today  Goals Unmet:  Not Applicable  Comments: Service time is from 0935 to [place]    Dr. Fransico Him is Medical Director for Cardiac Rehab at [facility].

## 2020-12-08 NOTE — Progress Notes (Signed)
I have reviewed a Home Exercise Prescription with Reginald Davis . Reginald Davis is currently exercising at home. The patient has access to a treadmill and a stationary bike at home. He exercises 2-3 additional days per week at home. The patient was advised to continue with treadmill, stationary bike, and resistance training 2-3 additionaly days a week for 45-60 minutes.  Reginald Davis and I discussed how to progress their exercise prescription. The patient stated that their goals were to increase longevity, cardiac fitness, core strength, and to have better knowledge of his POC. The patient stated that they understand the exercise prescription.  We reviewed exercise guidelines, target heart rate during exercise, RPE Scale, weather conditions, use of rescue inhaler, endpoints for exercise, warmup and cool down.  Patient is encouraged to come to me with any questions. I will continue to follow up with the patient to assist them with progression and safety.   Reginald Duff MS, ACSM CEP

## 2020-12-12 ENCOUNTER — Other Ambulatory Visit (INDEPENDENT_AMBULATORY_CARE_PROVIDER_SITE_OTHER): Payer: Medicare Other

## 2020-12-12 DIAGNOSIS — J849 Interstitial pulmonary disease, unspecified: Secondary | ICD-10-CM | POA: Diagnosis not present

## 2020-12-12 LAB — CBC WITH DIFFERENTIAL/PLATELET
Basophils Absolute: 0.1 10*3/uL (ref 0.0–0.1)
Basophils Relative: 0.8 % (ref 0.0–3.0)
Eosinophils Absolute: 0.2 10*3/uL (ref 0.0–0.7)
Eosinophils Relative: 2.9 % (ref 0.0–5.0)
HCT: 42.7 % (ref 39.0–52.0)
Hemoglobin: 14.3 g/dL (ref 13.0–17.0)
Lymphocytes Relative: 25.8 % (ref 12.0–46.0)
Lymphs Abs: 2.1 10*3/uL (ref 0.7–4.0)
MCHC: 33.5 g/dL (ref 30.0–36.0)
MCV: 99.1 fl (ref 78.0–100.0)
Monocytes Absolute: 0.7 10*3/uL (ref 0.1–1.0)
Monocytes Relative: 8.6 % (ref 3.0–12.0)
Neutro Abs: 5 10*3/uL (ref 1.4–7.7)
Neutrophils Relative %: 61.9 % (ref 43.0–77.0)
Platelets: 277 10*3/uL (ref 150.0–400.0)
RBC: 4.31 Mil/uL (ref 4.22–5.81)
RDW: 13.8 % (ref 11.5–15.5)
WBC: 8.1 10*3/uL (ref 4.0–10.5)

## 2020-12-12 LAB — HEPATIC FUNCTION PANEL
ALT: 23 U/L (ref 0–53)
AST: 23 U/L (ref 0–37)
Albumin: 4.2 g/dL (ref 3.5–5.2)
Alkaline Phosphatase: 44 U/L (ref 39–117)
Bilirubin, Direct: 0.1 mg/dL (ref 0.0–0.3)
Total Bilirubin: 0.3 mg/dL (ref 0.2–1.2)
Total Protein: 6.8 g/dL (ref 6.0–8.3)

## 2020-12-13 ENCOUNTER — Encounter (HOSPITAL_COMMUNITY)
Admission: RE | Admit: 2020-12-13 | Discharge: 2020-12-13 | Disposition: A | Discharge status: Home / Self Care | Payer: Medicare Other | Source: Ambulatory Visit | Attending: Internal Medicine | Admitting: Internal Medicine

## 2020-12-13 ENCOUNTER — Other Ambulatory Visit: Payer: Self-pay

## 2020-12-13 VITALS — Wt 172.4 lb

## 2020-12-13 DIAGNOSIS — J849 Interstitial pulmonary disease, unspecified: Secondary | ICD-10-CM | POA: Insufficient documentation

## 2020-12-13 NOTE — Progress Notes (Signed)
Daily Session Note  Patient Details  Name: Reginald Davis MRN: [id number] Date of Birth: Sep 09, 1946 Referring Provider:   April Manson Pulmonary Rehab Walk Test from 10/31/2020 in [place]  Referring Provider Dr. Chase Caller      Encounter Date: 12/13/2020  Check In:  Session Check In - 12/13/20 0946      Check-In   Supervising physician immediately available to respond to emergencies [facility] immediately available    Physician(s) Dr. Tawanna Solo    [facility]    Staff Present Maurice Small, RN, BSN;Lisa Ysidro Evert, RN;Tereza Gilham Hassell Done, MS, ACSM-CEP, Exercise Physiologist    Virtual Visit No    Medication changes reported     No    Fall or balance concerns reported    No    Tobacco Cessation No Change    Warm-up and Cool-down Performed on first and last piece of equipment    Resistance Training Performed Yes    VAD Patient? No    PAD/SET Patient? No      Pain Assessment   Currently in Pain? No/denies    Multiple Pain Sites No           Capillary Blood Glucose: No results found for this or any previous visit (from the past 24 hour(s)).   Exercise Prescription Changes - 12/13/20 1100      Response to Exercise   Blood Pressure (Admit) 120/66    Blood Pressure (Exercise) 168/66    Blood Pressure (Exit) 106/60    Heart Rate (Admit) 72 bpm    Heart Rate (Exercise) 105 bpm    Heart Rate (Exit) 80 bpm    Oxygen Saturation (Admit) 95 %    Oxygen Saturation (Exercise) 91 %    Oxygen Saturation (Exit) 94 %    Rating of Perceived Exertion (Exercise) 12    Perceived Dyspnea (Exercise) 1    Duration Progress to 45 minutes of aerobic exercise without signs/symptoms of physical distress    Intensity THRR unchanged      Progression   Progression Continue to progress workloads to maintain intensity without signs/symptoms of physical distress.      Resistance Training   Training Prescription Yes    Weight blue bands     Reps 10-15    Time 10 Minutes      Oxygen   Oxygen Intermittent    Liters 3   3L POC with exercise     Recumbant Bike   Level 3    Minutes 15    METs 2.7      Track   Laps 20    Minutes 15    METs 3.32           Social History   Tobacco Use  Smoking Status Former Smoker  . Packs/day: 2.00  . Years: 20.00  . Pack years: 40.00  . Types: Cigarettes  . Quit date: 31  . Years since quitting: 25.2  Smokeless Tobacco Never Used    Goals Met:  Proper associated with RPD/PD & O2 Sat Independence with exercise equipment Exercise tolerated well No report of cardiac concerns or symptoms Strength training completed today  Goals Unmet:  Not Applicable  Comments: Service time is from 0930 to 1035    Dr. Fransico Him is Medical Director for Cardiac Rehab at [facility].

## 2020-12-14 ENCOUNTER — Other Ambulatory Visit: Payer: Self-pay | Admitting: Internal Medicine

## 2020-12-14 DIAGNOSIS — J84112 Idiopathic pulmonary fibrosis: Secondary | ICD-10-CM

## 2020-12-14 DIAGNOSIS — Z006 Encounter for examination for normal comparison and control in clinical research program: Secondary | ICD-10-CM

## 2020-12-15 ENCOUNTER — Other Ambulatory Visit: Payer: Self-pay

## 2020-12-15 ENCOUNTER — Encounter (HOSPITAL_COMMUNITY)
Admission: RE | Admit: 2020-12-15 | Discharge: 2020-12-15 | Disposition: A | Discharge status: Home / Self Care | Payer: Medicare Other | Source: Ambulatory Visit | Attending: Internal Medicine | Admitting: Internal Medicine

## 2020-12-15 DIAGNOSIS — J849 Interstitial pulmonary disease, unspecified: Secondary | ICD-10-CM

## 2020-12-15 NOTE — Progress Notes (Signed)
Daily Session Note  Patient Details  Name: RIEN MARLAND MRN: [id number] Date of Birth: May 10, 1946 Referring Provider:   April Manson Pulmonary Rehab Walk Test from 10/31/2020 in [place]  Referring Provider Dr. Chase Caller      Encounter Date: 12/15/2020  Check In:  Session Check In - 12/15/20 [place]      Check-In   Supervising physician immediately available to respond to emergencies [facility] immediately available    Physician(s) Dr. Tawanna Solo    [facility]    Staff Present Rosebud Poles, RN, BSN;Frederick Marro Ysidro Evert, RN;Jessica Hassell Done, MS, ACSM-CEP, Exercise Physiologist    Virtual Visit No    Medication changes reported     No    Fall or balance concerns reported    No    Tobacco Cessation No Change    Warm-up and Cool-down Performed as group-led instruction    Resistance Training Performed Yes    VAD Patient? No    PAD/SET Patient? No      Pain Assessment   Currently in Pain? No/denies    Multiple Pain Sites No           Capillary Blood Glucose: No results found for this or any previous visit (from the past 24 hour(s)).    Social History   Tobacco Use  Smoking Status Former Smoker  . Packs/day: 2.00  . Years: 20.00  . Pack years: 40.00  . Types: Cigarettes  . Quit date: 59  . Years since quitting: 25.2  Smokeless Tobacco Never Used    Goals Met:  Exercise tolerated well No report of cardiac concerns or symptoms Strength training completed today  Goals Unmet:  Not Applicable  Comments: Service time is from 0930 to 1053    Dr. Fransico Him is Medical Director for Cardiac Rehab at [facility].

## 2020-12-16 DIAGNOSIS — Z23 Encounter for immunization: Secondary | ICD-10-CM | POA: Diagnosis not present

## 2020-12-19 ENCOUNTER — Other Ambulatory Visit (HOSPITAL_COMMUNITY): Payer: Medicare Other

## 2020-12-20 ENCOUNTER — Other Ambulatory Visit: Payer: Self-pay

## 2020-12-20 ENCOUNTER — Encounter (HOSPITAL_COMMUNITY)
Admission: RE | Admit: 2020-12-20 | Discharge: 2020-12-20 | Disposition: A | Discharge status: Home / Self Care | Payer: Medicare Other | Source: Ambulatory Visit | Attending: Internal Medicine | Admitting: Internal Medicine

## 2020-12-20 DIAGNOSIS — J849 Interstitial pulmonary disease, unspecified: Secondary | ICD-10-CM | POA: Diagnosis not present

## 2020-12-20 NOTE — Progress Notes (Signed)
Daily Session Note  Patient Details  Name: EDD REPPERT MRN: [id number] Date of Birth: 1945/12/15 Referring Provider:   April Manson Pulmonary Rehab Walk Test from 10/31/2020 in [place]  Referring Provider Dr. Chase Caller      Encounter Date: 12/20/2020  Check In:  Session Check In - 12/20/20 1017      Check-In   Supervising physician immediately available to respond to emergencies [facility] immediately available    Physician(s) Dr. Tawanna Solo    [facility]    Staff Present Rosebud Poles, RN, Isaac Laud, MS, ACSM-CEP, Exercise Physiologist    Virtual Visit No    Medication changes reported     No    Fall or balance concerns reported    No    Tobacco Cessation No Change    Warm-up and Cool-down Performed on first and last piece of equipment    Resistance Training Performed Yes    VAD Patient? No    PAD/SET Patient? No      Pain Assessment   Currently in Pain? No/denies    Multiple Pain Sites No           Capillary Blood Glucose: No results found for this or any previous visit (from the past 24 hour(s)).    Social History   Tobacco Use  Smoking Status Former Smoker  . Packs/day: 2.00  . Years: 20.00  . Pack years: 40.00  . Types: Cigarettes  . Quit date: 7  . Years since quitting: 25.2  Smokeless Tobacco Never Used    Goals Met:  Proper associated with RPD/PD & O2 Sat Independence with exercise equipment Exercise tolerated well No report of cardiac concerns or symptoms Strength training completed today  Goals Unmet:  Not Applicable  Comments: Service time is from 0936 to 1042    Dr. Fransico Him is Medical Director for Cardiac Rehab at [facility].

## 2020-12-21 ENCOUNTER — Ambulatory Visit (INDEPENDENT_AMBULATORY_CARE_PROVIDER_SITE_OTHER): Payer: Medicare Other | Admitting: Internal Medicine

## 2020-12-21 VITALS — BP 118/70 | HR 64 | Ht 66.0 in | Wt 171.2 lb

## 2020-12-21 DIAGNOSIS — J84112 Idiopathic pulmonary fibrosis: Secondary | ICD-10-CM

## 2020-12-21 DIAGNOSIS — Z5181 Encounter for therapeutic drug level monitoring: Secondary | ICD-10-CM | POA: Diagnosis not present

## 2020-12-21 DIAGNOSIS — I2510 Atherosclerotic heart disease of native coronary artery without angina pectoris: Secondary | ICD-10-CM

## 2020-12-21 NOTE — Patient Instructions (Addendum)
IPF (idiopathic pulmonary fibrosis) ([place]) with associated acid reflux Encounter for therapeutic monitoring   -Breathing test between November 2021 in [place] compared to March 2022 in [place] with and in between test at [facility]  -Overall suggest a slight decline but the significant fluctuation between the [employer] and [place] tests but taken together since November 2021 I think there is a slight decline.   -Glad you are tolerating pirfenidone well'  -Glad you are unintentional weight loss program  -Noted that you have participating in clinical trial [employer] Part D starting mid May 2022  -Glad you are doing exercise program and using oxygen with exercise and at night  -Most recent liver function test normal December 12, 2020  Plan  -Continue pirfenidone per protocol and apply sunscreen -Continue exercise program -Continue oxygen use with exertion and at night -Keep up with research participation that is upcoming in a month  -Make note that payments for research should not go to insurance  -Results during recent study could be available to you Followup  -Research program mid May 2022  -Make a standard of care visit with Dr. Chase Caller 30 minutes in 3 months from now  -But we can cancel this depending on how research participation is going  - ok for tavel with o2 and backup antibiotic/pred pack     Results for JASH, WAHLEN (MRN [id number]) as of 12/21/2020 11:06  Ref. Range 07/12/2020 11:53 10/24/2020 [facility] 11/25/2020 08:52  FVC-Pre Latest Units: L 3.36 3.55 3.43  FVC-%Pred-Pre Latest Units: % 88 100% 94   Results for JACQUISE, RARICK (MRN [id number]) as of 12/21/2020 11:06  Ref. Range 07/12/2020 11:53 10/24/2020 [facility] 11/25/2020 08:52  DLCO cor Latest Units: ml/min/mmHg 16.88 11.02 13.45  DLCO cor % pred Latest Units: % 73 49.3 60

## 2020-12-21 NOTE — Progress Notes (Signed)
OV 08/09/2020  Subjective:  Patient ID: Reginald Davis, male , DOB: 16-Mar-1946 , age 75 y.o. , MRN: [id number] , ADDRESS: [place] [address] PCP [name], MD Patient Care Team: [name], MD as PCP - General (Internal Medicine) End, Harrell Gave, MD as PCP - Cardiology (Cardiology) Cardiologist is Dr. Ottie Glazier This Provider for this visit: Treatment Team:  Attending Provider: Brand Males, MD    08/09/2020 -   Chief Complaint  Patient presents with  . Consult    COPD, DOE     HPI Reginald Davis 75 y.o. -  retired academic an Therapist, sports for a [employer]  History is given by the patient who also brought in very clearly typed notes about his history.  He says in the 1970s and 1980s while he was busy with his professional life he was a smoker and finally quit smoking in 1997.  His total pack smoking history might be 70 years.  He undergoes regular screening CT scans of the chest.  Most recently March 2021 that reported signs of emphysema and a small upper lobe nodule unchanged over many years and possible interstitial lung disease changes.  He says he was at baseline and doing well but in April 2021 he started noticing shortness of breath.  He was placed on Spiriva which improved his symptoms daily.  After that he felt like significantly improved.  He then was dealing with significant difficult to control blood pressures with Dr. Meda Coffee.  She has increased his dosage of the blood pressure medication.  He was remaining active walking approximately thousand steps daily and 30 minutes of walking 5 more days per week.  Then in September 2020 when he and his wife traveled to [place] and then drove to [place] which is an elevation of 4000-4500 feet.  After that they continue to [facility] at [place] feet.  They got very short of breath.  Even had a fever episode.  His pulse oximetry dropped to 84-85.  He ended up in the emergency room and  was given a diagnosis of COPD exacerbation.  Given doxycycline and prednisone and then he improved.  Post improvement in September and post return to [facility] he has had difficult to control blood pressure but he does have some residual shortness of breath with exertion relieved by rest.  His apple watch has noticed desaturations at night that are transient with pulse ox ranging 85 to 96%.  His resting heart rate at bed is between 54 and 90 at night.  His current walking desaturation test is below.  He dropped only 2 points but he did get tachycardic.  His pulmonary function test show slight reduction in DLCO but otherwise okay  He follows with Dr. Meda Coffee who noticed he has left lower lobe crackles.  Due to persistent symptoms he has been referred to pulmonary clinic.  He wants to get to the bottom of the issues.  His echocardiogram shows grade 1 diastolic dysfunction.  He does suffer from some visceral obesity.   Of note he has upcoming air travel to [place] on August 15, 2020.  He is going to see his granddaughter who is not seen since the onset of the pandemic.  He believes he may not be traveling to high altitudes in [place].   Simple office walk 185 feet x  3 laps goal with forehead probe 08/09/2020   O2 used ra  Number laps completed 3  Comments about  pace normal  Resting Pulse Ox/HR 96% and 80/min  Final Pulse Ox/HR 94% and 90/min  Desaturated </= 88% no  Desaturated <= 3% points no  Got Tachycardic >/= 90/min yes  Symptoms at end of test dyspnea  Miscellaneous comments x         PFT Results Latest Ref Rng & Units 07/12/2020  FVC-Pre L 3.36  FVC-Predicted Pre % 88  FVC-Post L 3.41  FVC-Predicted Post % 90  Pre FEV1/FVC % % 72  Post FEV1/FCV % % 74  FEV1-Pre L 2.41  FEV1-Predicted Pre % 88  FEV1-Post L 2.51  DLCO uncorrected ml/min/mmHg 16.88  DLCO UNC% % 73  DLCO corrected ml/min/mmHg 16.88  DLCO COR %Predicted % 73  DLVA Predicted % 80  TLC L 5.39  TLC %  Predicted % 83  RV % Predicted % 65    ADDENDUM: There is a right lower lobe nodule that is seen between vessels in the right lower lobe on image 97 of series 8. This nodule was compared to studies dating back to 2015 and shows no change. It is possible that this represents a lymph node along bronchovascular structures but again this is stable over a 6 year interval, obscured by surrounding vessels and bronchovascular structures.   Electronically Signed   By: Zetta Bills M.D.   On: 11/27/2019 14:45   Signed by Felipa Emory, MD on 11/27/2019 2:48 PM  Narrative & Impression  CLINICAL DATA:  Left upper lobe nodules, history of bronchiectasis and emphysema.  EXAM: CT CHEST WITHOUT CONTRAST  TECHNIQUE: Multidetector CT imaging of the chest was performed following the standard protocol without IV contrast.  COMPARISON:  Coronary CT from 08/26/2018, CT chest from 09/20/2017  FINDINGS: Cardiovascular: Calcified atherosclerotic changes in the thoracic aorta without signs of aneurysm. Limited assessment on noncontrast evaluation. The heart size is normal without pericardial effusion. Coronary artery disease with three-vessel calcification as before.  Mediastinum/Nodes: Thoracic inlet structures are normal. No axillary lymphadenopathy. Small right juxta hilar lymph node, 9 mm unchanged since January of 2019 along with other scattered lymph nodes below CT criteria for pathologic enlargement. Limited assessment of hilar structures due to lack of contrast. Esophagus is mildly patulous with some gas in the lumen.  Lungs/Pleura: Small nodule between bronchovascular structures in the central portion of the right lower lobe (image 97, series 8) 11 x 7 mm, within 1 mm of its size in 2019. No dense consolidation. Mild basilar bronchiectasis. Subpleural reticulation as before with signs of centrilobular emphysema.  Tiny nodules in the left upper lobe are  stable.  Upper Abdomen: Incidentally imaged portions of upper abdominal contents are unremarkable. No evidence of acute upper abdominal process.  Musculoskeletal: No chest wall mass. Sclerosis of the right clavicular head is unchanged since 2019. Spinal degenerative changes as before.  IMPRESSION: 1. Small nodule between bronchovascular structures in the central portion of the right lower lobe measuring 11 x 7 mm, within 1 mm of its size in 2019. This nodule is within 1 mm of its size in 2019. This nodule is unchanged since 2015 and this therefore benign. 2. Signs of emphysema and chronic interstitial changes with tiny upper lobe nodules are unchanged. 3. Coronary artery disease with three-vessel calcification.  Aortic Atherosclerosis (ICD10-I70.0) and Emphysema (ICD10-J43.9).  Electronically Signed: By: Zetta Bills M.D. On: 11/26/2019 09:11     08/29/2020- Interim hx Patient presents today to review testing and discuss starting anti-fiibrotic medication. CT imaging suggestive of probable UIP with progression  since 2015, high suspicion towards IPF. He had negative serology. Dr. Chase Caller recommend starting anti-fibrotic, either Esbriet or OFEV. Accompanied by his wife during today's appointment. He is baseline today, shortness of breath and cough are the same as last visit. He was recently in [place] for 10 days, he did not need to take on hand doxycyline or prednisone. He had an overnight oximetry test on12/2/21 which showed <88% 5 hrs 35 mins, start 2L [place]. He has not received oxygen yet, he was contacted by DME company. He is interested in [employer] continuous oxygen concentrator and is willing to pay out of pocket in needed. He reports daytime oxygen saturation runs between 87-92% RA. He is active, gets an average of 8,000 steps a day. He will be changing medicare part D plans to [employer] silver come January 1st 2022.     OV 09/29/2020  Subjective:  Patient ID: Reginald Davis, male , DOB: 05-25-1946 , age 25 y.o. , MRN: [id number] , ADDRESS: [place] [address] PCP [name], MD Patient Care Team: [name], MD as PCP - General (Internal Medicine) End, Harrell Gave, MD as PCP - Cardiology (Cardiology)  This Provider for this visit: Treatment Team:  Attending Provider: Brand Males, MD    09/29/2020 -   Chief Complaint  Patient presents with  . Follow-up    6 min walk performed 09/22/20.  Pt states he feels like he has been stable since last visit. Pt is wearing 2L at night.  Pt states if he is on an incline, his sats are dropping some below 88% on room air.    IPF dx givien 08/26/20 based on "probable UIP on CT + negative srology, +  age > 68, _ white + male + prior smoker + heart burn/GERD hx-> this is "IPF"  Associated mild emphysema present -started Spiriva  GERD hx  Pirfenidone start this pending January 2022/February 2022  HPI Reginald Davis 75 y.o. -returns for follow-up.  He presents with his wife.  Since his last visit in the interim he saw nurse practitioner.  We gave him a diagnosis of IPF based on probable UIP on CT scan age greater than 46, Caucasian ethnicity, male gender, prior smoker, acid reflux history and negative serology.  He has accepted the diagnosis.  He presents with his wife at this point in time.  They have gone through pirfenidone education and started paperwork.  He is waiting for co-pay approval.  At this point in time he feels stable his symptom score is below.  He is completed a 6-minute walk test but he did not desaturate below 88%.  The focus of this visit is multiple questions in general education and answering questions about idiopathic pulmonary fibrosis  -Overall his cough is improved.  He attributes his cough improvement to omeprazole.  However he wants to switch to pantoprazole.  He did some reading and he said that he is concerned about theoretical effects of omeprazole and pirfenidone  plasma levels therefore he wants to switch to pantoprazole.  I have supported this and will do the prescription.  -He is very interested in clinical trials as a care option.  We discussed concept of clinical trials being voluntary and what care option meant.  He is probably leaning towards later phase trials.  We gave him consent form for PROMEDIOR Phase 3 and Pliant phase 2 studies but did indicate to him that there is currently a backlog and in addition he will have to  be stable on antifibrotic's for a few months before he can enroll.  He is understanding of this.  -He is interested in the patient support group and I have given him the email for a local support group to the pulmonary fibrosis foundation  -He exercises regularly.  However he is interested in joining pulmonary rehabilitation a referral has been made but he is still waiting to hear from them.  I have emailed the director to get a follow-up.  -We discussed the natural history of pulmonary fibrosis.  Especially IPF.  We discussed its progressive disease.  He and his wife wanted to know about prognostic markers.  Currently these are not commercially available.  In particular is interested in [place].  Indicated that in the next few years, she will test might be available to differentiate prognosis but at this time we would follow clinically.  Currently based on current severity and stability predicts future progression.  Also explained viral illnesses can exacerbate diseases  - Lung transplantation: He is interested in this concept.  He wants early referral to [facility] so he gets his options covered.  He knows he has to lose weight.  We discussed common barriers for lung transplantation including psychological Support, social support, financial costs.  He says he does not have any of this.  His BMI is 29 he needs to get to less than 27.  Explained to him nutrition is the way to go on this.  He is also going to do rehabilitation   - We  also discussed his emphysema component being small.  He will continue with Spiriva  = Oxygenation -he is using nocturnal oxygen.  His apple watch shows his oxygen levels to be normal now at night.  He is monitoring this.  He says that when he exerts heavily his oxygen does drop but on the 6-minute walk test he did not drop.  He will pay for a portable oxygen system on his own out-of-pocket.  Therefore we will do an order for this.  SYMPTOM SCALE - ILD 09/29/2020   O2 use ra  Shortness of Breath 0 -> 5 scale with 5 being worst (score 6 If unable to do)  At rest 0.5  Simple tasks - showers, clothes change, eating, shaving 1  Household (dishes, doing bed, laundry) 1  Shopping 1  Walking level at own pace 1.5  Walking up Stairs *2.25  Total (30-36) Dyspnea Score 7.25  How bad is your cough? Improved with ppi  How bad is your fatigue 0  How bad is nausea 0  How bad is vomiting?  0  How bad is diarrhea? 0.5 coffee  How bad is anxiety? 0.5  How bad is depression 0       CT Chest data  No results found.    OV 11/10/2020  Subjective:  Patient ID: Reginald Davis, male , DOB: 07/29/1946 , age 22 y.o. , MRN: [id number] , ADDRESS: [place] [address] PCP [name], MD Patient Care Team: [name], MD as PCP - General (Internal Medicine) End, Harrell Gave, MD as PCP - Cardiology (Cardiology)  This Provider for this visit: Treatment Team:  Attending Provider: Brand Males, MD    11/10/2020 -   Chief Complaint  Patient presents with  . Follow-up    Doing well    IPF dx givien 08/26/20 based on "probable UIP on CT + negative srology, +  age > 66, _ white + male + prior smoker + heart  burn/GERD hx-> this is "IPF"  -Last CT scan of the chest December 2021  - Esbriet start date -> Oct 26, 2020   - Pulm rehab Mar 2022   Associated mild emphysema present -started Spiriva    HPI Reginald Davis 75 y.o. -presents with his wife. This is for IPF follow-up.  He finally got hold of his pirfenidone. He started this 10/26/2020. So far he is tolerating it well. Earlier this week on 11/08/2020 he started pulmonary rehabilitation. He feels stable. Wife feels he is an under perceiver. On a 6-minute walk test he dropped to as low as 89%. He had a pulmonary function test at [facility] that showed decline in DLCO. He is worried about this. We walked him again today and his pulse ox did drop to 88%. That could have been a variation in place but clearly this a difference suggesting progression. However he is tolerating his pirfenidone fine. No side effects as evidenced below in the symptom score. He really wants to start ASAP on clinical trials. He is already met with [facility] for transplant as an option. He had to consent forms one for phase 3 IV infusion PRomedior and another for Phase 2 Pliant Part C. The latter has moved to Part D which is higher dose. He had more questions about this. We went over several components of clinical trials documented below. Explained to him about therapeutic misconception and secondary intent being therapeutic gain. Advised him that the primary intent should always be about drug development and contribution to science. He understood this. Explained to him currently there is supply chain issues with a phase 3 study. However the phase 2 sutdy is active. However he were to wait a few months before he is eligible for either study because he just started on pirfenidone. He understands this. We have given him an updated consent form.   1. Scientific Purpose  Clinical research is designed to produce generalizable knowledge and to answer questions about the safety and efficacy of intervention(s) under study in order to determine whether or not they may be useful for the care of future patients.  2. Study Procedures  Participation in a trial may involve procedures or tests, in addition to the intervention(s) under study, that are intended  only or primarily to generate scientific knowledge and that are otherwise not necessary for patient care.   3. Uncertainty  For intervention(s) under study in clinical research, there often is less knowledge and more uncertainty about the risks and benefits to a population of trial participants than there is when a doctor offers a patient standard interventions.   4. Adherence to Protocol  Administration of the intervention(s) under study is typically based on a strict protocol with defined dose, scheduling, and use or avoidance of concurrent medications, compared to administration of standard interventions.  5. Clinician as Investigator  Clinicians who are in health care settings provide treatment; in a clinical trial setting, they are also investigating safety and efficacy of an intervention. In otherwise your doctor or nurse practitioner can be wearing 2 hats - one as care giver another as Company secretary  6. Patient as Visual merchandiser Subject  Patients participating in research trials are research subjects or volunteers. In other words participating in research is 100% voluntary and at one's own free weill. The decision to participate or not participate will NOT affect patient care and the doctor-patient relationship in any way  7. Financial Conflict of Interest Disclosure  One  or more of the investigators of  the research trial might have an investment interest in [employer] the clinical trials practice through which the study is being conducted. Study sponsor pays the practice for the conduct of the study.    PFT  PFT Results Latest Ref Rng & Units 07/12/2020  FVC-Pre L 3.36  FVC-Predicted Pre % 88  FVC-Post L 3.41  FVC-Predicted Post % 90  Pre FEV1/FVC % % 72  Post FEV1/FCV % % 74  FEV1-Pre L 2.41  FEV1-Predicted Pre % 88  FEV1-Post L 2.51  DLCO uncorrected ml/min/mmHg 16.88  DLCO UNC% % 73  DLCO corrected ml/min/mmHg 16.88  DLCO COR %Predicted % 73  DLVA  Predicted % 80  TLC L 5.39  TLC % Predicted % 83  RV % Predicted % 65   PFT [facility] feb 2022 FVC 3.55L DLCO 11.02   Walk test Simple office walk 185 feet x  3 laps goal with forehead probe 08/09/2020  11/10/2020 - 71md [facility] 89% and HR111. 1296 fet. 0L o2 needed. Pl JR 111/ done 10/24/20   O2 used ra ra  Number laps completed 3 3  Comments about pace normal nL  Resting Pulse Ox/HR 96% and 80/min 96% and 90.min  Final Pulse Ox/HR 94% and 90/min 88% and 97/min  Desaturated </= 88% no yes  Desaturated <= 3% points no tyes  Got Tachycardic >/= 90/min yes yes  Symptoms at end of test dyspnea Some dyspnea  Miscellaneous comments x    PFT [facility] feb 2022   Telephone call 11/30/20   Nov 2021 -> March [place] with [place] - FVC up - dlco down  Compared to [facility] feb 2022  -March 2022 with  [place] - FVC down  - DLCO up   Overall  - would stay stable  - there is mixed signal - nothing to do for now  - allow anti fibrotic kick in  - be patient   OV 12/21/2020  Subjective:  Patient ID: Reginald Davis male , DOB: 902/24/1947, age 75y.o. , MRN: [id number], [id number] PCrist Infante MD Patient Care Team: PCrist Infante MD as PCP - General (Internal Medicine) End, CHarrell Gave MD as PCP - Cardiology (Cardiology)  This Provider for this visit: Treatment Team:  Attending Provider: RBrand Males MD    12/21/2020 -   Chief Complaint  Patient presents with  . Follow-up    PFT performed 11/25/20.  Pt states he has been doing okay and denies any complaints.     IPF dx givien 08/26/20 based on "probable UIP on CT + negative srology, +  age > 673 + white + male + prior smoker + heart burn/GERD hx-> this is "IPF"  -Last CT scan of the chest December 2021  - Esbriet start date -> Oct 26, 2020   - Pulm rehab Mar 2022  - Clinical trial education - Mar 2022 (Rosalia HammersD ICF given)  - PFF concept introduction - May 2022   Associated mild  emphysema present -started Spiriva  Normal Echo May 2021  HPI Reginald Deem89y.o. -returns for follow-up with his wife.  He is now on pirfenidone since mid 01/27/2021 and is tolerating it well.  He reports intentional weight loss.  He has some mild GI side effects that are reflected in the symptom score and this is stable.  He says with a combination of intentional weight loss and rehabilitation and  oxygen use [with exertion] his resting heart rate is now in the 60s.  He showed me his apple watch curve about that.  He is pretty excited about that.  He has had [place] for short of Covid vaccine mRNA.  He is really interested in clinical trials.  He has met with the research coordinator and is scheduled himself for Pliant study visit mid May 2022.  He has read the consent.  He is keeping up with the literature on pulmonary fibrosis.  He is pretty excited about an upcoming IND application approval for the [place] for [employer] BUI [id number].  We will be a study site for this but probably in the fall or the winter 2022.  I told him I do not know much details about this drug as yet.  He says he will start the Pliant study currently.  He wants to know about travel.  He has some prednisone and hand and also antibiotics.  We will go to [place] and [place].  He will take his portable oxygen system with him.  I have approved this travel.  At room air at rest he is fine.  When he exerts himself with treadmill and exercise he uses 2-3 L pulse.  Most recent liver function test was normal.  This was in December 12, 2020.  He is worried about his progression.  Particular [facility] test showed a drop.  He then had pulmonary function test with [place].  I put these results together.  It appears that there is lot of fluctuation particularly with the [facility] test.  But on average it appears his DLCO is declined since he has been with [place] but his FVC is stable.   He is enjoying his  rehabilitation.    SYMPTOM SCALE - ILD 09/29/2020  11/10/2020 Now on esbriet sinc 10/26/20 12/21/2020 esbriet + 171#  O2 use ra  ra a t rest 2-3L with exercise pulsed  Shortness of Breath 0 -> 5 scale with 5 being worst (score 6 If unable to do)    At rest 0.5 0 0.5  Simple tasks - showers, clothes change, eating, shaving 1 00 0  Household (dishes, doing bed, laundry) 1 0 0.5  Shopping 1 0 0.5  Walking level at own pace 1.5 1 1   Walking up Stairs *2.25 2 2   Total (30-36) Dyspnea Score 7.25 3 6   How bad is your cough? Improved with ppi 1 1  How bad is your fatigue 0 0 0.5  How bad is nausea 0 0 0  How bad is vomiting?  0 00 0  How bad is diarrhea? 0.5 coffee 0 0.25  How bad is anxiety? 0.5 0 0.5  How bad is depression 0 0 0   Results for Reginald Davis, Reginald Davis (MRN [id number]) as of 12/21/2020 11:06  Ref. Range 07/12/2020 11:53 10/24/2020 [facility] 11/25/2020 08:52  FVC-Pre Latest Units: L 3.36 3.55 3.43  FVC-%Pred-Pre Latest Units: % 88 100% 94   Results for Reginald Davis, Reginald Davis (MRN [id number]) as of 12/21/2020 11:06  Ref. Range 07/12/2020 11:53 10/24/2020 [facility] 11/25/2020 08:52  DLCO cor Latest Units: ml/min/mmHg 16.88 11.02 13.45  DLCO cor % pred Latest Units: % 73 49.3 60   PFT  PFT Results Latest Ref Rng & Units 11/25/2020 07/12/2020  FVC-Pre L 3.43 3.36  FVC-Predicted Pre % 94 88  FVC-Post L - 3.41  FVC-Predicted Post % - 90  Pre FEV1/FVC % % 74 72  Post FEV1/FCV % % -  74  FEV1-Pre L 2.53 2.41  FEV1-Predicted Pre % 97 88  FEV1-Post L - 2.51  DLCO uncorrected ml/min/mmHg 13.56 16.88  DLCO UNC% % 60 73  DLCO corrected ml/min/mmHg 13.45 16.88  DLCO COR %Predicted % 60 73  DLVA Predicted % 66 80  TLC L - 5.39  TLC % Predicted % - 83  RV % Predicted % - 65     Walk test Simple office walk 185 feet x  3 laps goal with forehead probe 08/09/2020  11/10/2020 - 73[facility] 89% and HR111. 1296 fet. 0L o2 needed. Pl JR 111/ done 10/24/20   O2 used ra ra  Number laps completed 3 3  Comments  about pace normal nL  Resting Pulse Ox/HR 96% and 80/min 96% and 90.min  Final Pulse Ox/HR 94% and 90/min 88% and 97/min  Desaturated </= 88% no yes  Desaturated <= 3% points no tyes  Got Tachycardic >/= 90/min yes yes  Symptoms at end of test dyspnea Some dyspnea  Miscellaneous comments x      has a past medical history of Burn, Coronary artery disease, Diverticul disease small and large intestine, no perforati or abscess, GERD (gastroesophageal reflux disease), Hernia, History of nuclear stress test, Hyperlipidemia, Low back pain, Oxygen toxicity, Plantar fasciitis, Retinal tear, Right rotator cuff tear, and Shingles.   reports that he quit smoking about 25 years ago. His smoking use included cigarettes. He has a 40.00 pack-year smoking history. He has never used smokeless tobacco.  Past Surgical History:  Procedure Laterality Date  . EYE SURGERY    . REFRACTIVE SURGERY     retina repair and retina tear  . SHOULDER ARTHROSCOPY    . SHOULDER SURGERY    . TONSILLECTOMY      No Active Allergies  Immunization History  Administered Date(s) Administered  . Influenza Split 09/22/2009, 06/01/2010, 06/07/2011, 05/27/2012, 07/28/2013  . Influenza, High Dose Seasonal PF 06/15/2015, 06/05/2016, 06/08/2017, 06/07/2018  . Influenza, Quadrivalent, Recombinant, Inj, Pf 06/07/2018, 06/12/2019, 06/22/2020  . Influenza,inj,Quad PF,6+ Mos 06/18/2014  . PFIZER(Purple Top)SARS-COV-2 Vaccination 10/05/2019, 10/27/2019, 06/03/2020  . Pneumococcal Conjugate-13 08/12/2013, 06/13/2014  . Pneumococcal Polysaccharide-23 11/04/2000, 09/22/2009, 08/16/2010  . Td 09/10/1998, 09/22/2009, 12/23/2019  . Tdap 10/02/2010  . Typhoid Inactivated 09/09/2015  . Zoster 09/16/2007, 09/22/2009  . Zoster Recombinat (Shingrix) 01/13/2017, 04/26/2017    Family History  Problem Relation Age of Onset  . Hyperlipidemia Mother   . Diabetes Father   . Heart disease Father   . Coronary artery disease Brother       Current Outpatient Medications:  .  albuterol (VENTOLIN HFA) 108 (90 Base) MCG/ACT inhaler, Inhale 1-2 puffs into the lungs every 6 (six) hours as needed for wheezing or shortness of breath., Disp: 18 g, Rfl: 1 .  Alirocumab (PRALUENT) 150 MG/ML SOAJ, Inject 150 mg as directed every 30 (thirty) days. , Disp: , Rfl:  .  allopurinol (ZYLOPRIM) 100 MG tablet, Take 1 tablet by mouth 2 (two) times daily., Disp: , Rfl:  .  Alpha-Lipoic Acid 200 MG CAPS, Take 1 capsule by mouth in the morning and at bedtime., Disp: , Rfl:  .  aspirin 81 MG tablet, Take 81 mg by mouth daily., Disp: , Rfl:  .  atorvastatin (LIPITOR) 20 MG tablet, Take 10 mg by mouth daily. , Disp: , Rfl:  .  Coenzyme Q10 300 MG CAPS, Take 1 capsule by mouth daily., Disp: , Rfl: 0 .  colchicine 0.6 MG tablet, Take 1 tablet by mouth  2 (two) times daily as needed (gout flare)., Disp: , Rfl:  .  desonide (DESOWEN) 0.05 % ointment, Apply 1 application topically 2 (two) times daily as needed., Disp: , Rfl:  .  dorzolamide-timolol (COSOPT) 22.3-6.8 MG/ML ophthalmic solution, Place 2 drops into the right eye daily. , Disp: , Rfl:  .  fluocinonide (LIDEX) 0.05 % external solution, Apply 1 application topically as needed., Disp: , Rfl:  .  Guaifenesin 1200 MG TB12, Take 1,200 mg by mouth 2 (two) times daily., Disp: , Rfl:  .  Multiple Vitamins-Minerals (CENTRUM SILVER PO), Take 1 tablet by mouth daily., Disp: , Rfl:  .  Omega-3 Fatty Acids (FISH OIL CONCENTRATE PO), Take 2,400 mg by mouth daily. , Disp: , Rfl:  .  pantoprazole (PROTONIX) 40 MG tablet, Take 1 tablet (40 mg total) by mouth daily., Disp: 90 tablet, Rfl: 3 .  Pirfenidone (ESBRIET) 801 MG TABS, Take 1 tablet by mouth in the morning, at noon, and at bedtime., Disp: , Rfl:  .  prednisoLONE acetate (PRED FORTE) 1 % ophthalmic suspension, Place 1 drop into the right eye daily. , Disp: , Rfl:  .  tiotropium (SPIRIVA HANDIHALER) 18 MCG inhalation capsule, Place 1 capsule (18 mcg total)  into inhaler and inhale daily., Disp: 30 capsule, Rfl: 12 .  valACYclovir (VALTREX) 1000 MG tablet, Take 1 tablet by mouth daily as needed., Disp: , Rfl:  .  valsartan (DIOVAN) 320 MG tablet, Take 1 tablet (320 mg total) by mouth daily., Disp: 90 tablet, Rfl: 2 .  vitamin C (ASCORBIC ACID) 500 MG tablet, Take 500 mg by mouth daily., Disp: , Rfl:  .  Zn-Pyg Afri-Nettle-Saw Palmet (SAW PALMETTO COMPLEX PO), Take 1 tablet by mouth daily., Disp: , Rfl:  .  ciprofloxacin (CIPRO) 500 MG tablet, Take 500 mg by mouth as directed. (Patient not taking: Reported on 12/21/2020), Disp: , Rfl:  .  doxycycline (VIBRAMYCIN) 100 MG capsule, Take 100 mg by mouth as directed. (Patient not taking: Reported on 12/21/2020), Disp: , Rfl:  .  predniSONE (DELTASONE) 10 MG tablet, Take 10 mg by mouth as directed. (Patient not taking: Reported on 12/21/2020), Disp: , Rfl:       Objective:   Vitals:   12/21/20 1018  BP: 118/70  Pulse: 64  SpO2: 97%  Weight: 171 lb 3.2 oz (77.7 kg)  Height: 5' 6"  (1.676 m)    Estimated body mass index is 27.63 kg/m as calculated from the following:   Height as of this encounter: 5' 6"  (1.676 m).   Weight as of this encounter: 171 lb 3.2 oz (77.7 kg).  @WEIGHTCHANGE @  [employer]   12/21/20 1018  Weight: 171 lb 3.2 oz (77.7 kg)     Physical Exam General: No distress. Looks well Neuro: Alert and Oriented x 3. GCS 15. Speech normal Psych: Pleasant Resp:  Barrel Chest - no.  Wheeze - no, Crackles - yes at bae, No overt respiratory distress CVS: Normal heart sounds. Murmurs - no Ext: Stigmata of Connective Tissue Disease - no HEENT: Normal upper airway. PEERL +. No post nasal drip        Assessment:     No diagnosis found.     Plan:     Patient Instructions   IPF (idiopathic pulmonary fibrosis) ([facility]) with associated acid reflux Encounter for therapeutic monitoring   -Breathing test between November 2021 in [place] compared to March 2022 in [place] with  and in between test at [place] suggest a  slight decline but the significant fluctuation between the [employer] and [place] tests but taken together since November 2021 I think there is a slight decline.   -Glad you are tolerating pirfenidone well'  -Glad you are unintentional weight loss program  -Noted that you have participating in clinical trial [employer] Part D starting mid May 2022  -Glad you are doing exercise program and using oxygen with exercise and at night  -Most recent liver function test normal December 12, 2020  Plan  -Continue pirfenidone per protocol and apply sunscreen -Continue exercise program -Continue oxygen use with exertion and at night -Keep up with research participation that is upcoming in a month  -Make note that payments for research should not go to insurance  -Results during recent study could be available to you Followup  -Research program mid May 2022  -Make a standard of care visit with Dr. Chase Caller 30 minutes in 3 months from now  -But we can cancel this depending on how research participation is going  - ok for tavel with o2 and backup antibiotic/pred pack     Results for Reginald Davis, Reginald Davis (MRN [id number]) as of 12/21/2020 11:06  Ref. Range 07/12/2020 11:53 10/24/2020 [facility] 11/25/2020 08:52  FVC-Pre Latest Units: L 3.36 3.55 3.43  FVC-%Pred-Pre Latest Units: % 88 100% 94   Results for Reginald Davis, Reginald Davis (MRN [id number]) as of 12/21/2020 11:06  Ref. Range 07/12/2020 11:53 10/24/2020 [facility] 11/25/2020 08:52  DLCO cor Latest Units: ml/min/mmHg 16.88 11.02 13.45  DLCO cor % pred Latest Units: % 73 49.3 60       ( Level 05 visit: Estb 40-54 min   visit type: on-site physical face to visit  in total care time and counseling or/and coordination of care by this undersigned MD - Dr Brand Males. This includes one or more of the following on this same day 12/21/2020: pre-charting, chart review, note writing, documentation discussion of test  results, diagnostic or treatment recommendations, prognosis, risks and benefits of management options, instructions, education, compliance or risk-factor reduction. It excludes time spent by the [place] or office staff in the care of the patient. Actual time 93 min)   SIGNATURE    Dr. Brand Males, M.D., F.C.C.P,  Pulmonary and Critical Care Medicine Staff Physician, [place] Director - Interstitial Lung Disease  Program  Pulmonary [place] [facility] [place], [place], [address]  Pager: [phone], If no answer or between  15:00h - 7:00h: call [phone] Telephone: [phone]  12:27 PM 12/21/2020

## 2020-12-22 ENCOUNTER — Encounter (HOSPITAL_COMMUNITY)
Admission: RE | Admit: 2020-12-22 | Discharge: 2020-12-22 | Disposition: A | Discharge status: Home / Self Care | Payer: Medicare Other | Source: Ambulatory Visit | Attending: Internal Medicine | Admitting: Internal Medicine

## 2020-12-22 ENCOUNTER — Other Ambulatory Visit: Payer: Self-pay

## 2020-12-22 DIAGNOSIS — J849 Interstitial pulmonary disease, unspecified: Secondary | ICD-10-CM

## 2020-12-22 NOTE — Progress Notes (Signed)
Daily Session Note  Patient Details  Name: Reginald Davis MRN: [id number] Date of Birth: 03-29-1946 Referring Provider:   April Manson Pulmonary Rehab Walk Test from 10/31/2020 in [place]  Referring Provider Dr. Chase Caller      Encounter Date: 12/22/2020  Check In:  Session Check In - 12/22/20 1014      Check-In   Supervising physician immediately available to respond to emergencies [facility] immediately available    Physician(s) Dr. Florencia Reasons    Location [facility]    Staff Present Rosebud Poles, RN, BSN;Lisa Ysidro Evert, RN;Jessica Hassell Done, MS, ACSM-CEP, Exercise Physiologist    Virtual Visit No    Medication changes reported     No    Fall or balance concerns reported    No    Tobacco Cessation No Change    Warm-up and Cool-down Performed on first and last piece of equipment    Resistance Training Performed Yes    VAD Patient? No    PAD/SET Patient? No      Pain Assessment   Currently in Pain? No/denies    Multiple Pain Sites No           Capillary Blood Glucose: No results found for this or any previous visit (from the past 24 hour(s)).    Social History   Tobacco Use  Smoking Status Former Smoker  . Packs/day: 2.00  . Years: 20.00  . Pack years: 40.00  . Types: Cigarettes  . Quit date: 74  . Years since quitting: 25.2  Smokeless Tobacco Never Used    Goals Met:  Proper associated with RPD/PD & O2 Sat Exercise tolerated well Strength training completed today  Goals Unmet:  Not Applicable  Comments: Service time is from 0935 to 1110    Dr. Fransico Him is Medical Director for Cardiac Rehab at [facility].

## 2020-12-27 ENCOUNTER — Encounter (HOSPITAL_COMMUNITY)
Admission: RE | Admit: 2020-12-27 | Discharge: 2020-12-27 | Disposition: A | Discharge status: Home / Self Care | Payer: Medicare Other | Source: Ambulatory Visit | Attending: Internal Medicine | Admitting: Internal Medicine

## 2020-12-27 ENCOUNTER — Other Ambulatory Visit: Payer: Self-pay

## 2020-12-27 VITALS — Wt 173.7 lb

## 2020-12-27 DIAGNOSIS — J849 Interstitial pulmonary disease, unspecified: Secondary | ICD-10-CM | POA: Diagnosis not present

## 2020-12-27 NOTE — Progress Notes (Signed)
Daily Session Note  Patient Details  Name: Reginald Davis MRN: [id number] Date of Birth: 1946/01/13 Referring Provider:   April Manson Pulmonary Rehab Walk Test from 10/31/2020 in [place]  Referring Provider Dr. Chase Caller      Encounter Date: 12/27/2020  Check In:  Session Check In - 12/27/20 0951      Check-In   Supervising physician immediately available to respond to emergencies [facility] immediately available    Physician(s) Dr. Tawanna Solo    Location [facility]    Staff Present Rosebud Poles, RN, BSN;Gudrun Axe Ysidro Evert, RN;Jessica Hassell Done, MS, ACSM-CEP, Exercise Physiologist    Virtual Visit No    Medication changes reported     No    Fall or balance concerns reported    No    Tobacco Cessation No Change    Warm-up and Cool-down Performed on first and last piece of equipment    Resistance Training Performed Yes    VAD Patient? No    PAD/SET Patient? No      Pain Assessment   Currently in Pain? No/denies    Multiple Pain Sites No           Capillary Blood Glucose: No results found for this or any previous visit (from the past 24 hour(s)).   Exercise Prescription Changes - 12/27/20 1300      Response to Exercise   Blood Pressure (Admit) 136/80    Blood Pressure (Exercise) 180/80    Blood Pressure (Exit) 126/64    Heart Rate (Admit) 62 bpm    Heart Rate (Exercise) 100 bpm    Heart Rate (Exit) 83 bpm    Oxygen Saturation (Admit) 95 %    Oxygen Saturation (Exercise) 92 %    Oxygen Saturation (Exit) 95 %    Rating of Perceived Exertion (Exercise) 13    Perceived Dyspnea (Exercise) 1.5    Duration Continue with 30 min of aerobic exercise without signs/symptoms of physical distress.    Intensity THRR unchanged      Progression   Progression Continue to progress workloads to maintain intensity without signs/symptoms of physical distress.      Resistance Training   Training Prescription Yes    Weight blue bands     Reps 10-15    Time 10 Minutes      Oxygen   Oxygen Continuous    Liters 3      Recumbant Bike   Level 3.5    Minutes 15    METs 3.3      Track   Laps 21    Minutes 15           Social History   Tobacco Use  Smoking Status Former Smoker  . Packs/day: 2.00  . Years: 20.00  . Pack years: 40.00  . Types: Cigarettes  . Quit date: 26  . Years since quitting: 25.3  Smokeless Tobacco Never Used    Goals Met:  Exercise tolerated well No report of cardiac concerns or symptoms Strength training completed today  Goals Unmet:  Not Applicable  Comments: Service time is from 0932 to [place]    Dr. Fransico Him is Medical Director for Cardiac Rehab at [facility].

## 2020-12-29 ENCOUNTER — Encounter (HOSPITAL_COMMUNITY): Payer: Medicare Other

## 2021-01-03 ENCOUNTER — Encounter (HOSPITAL_COMMUNITY)
Admission: RE | Admit: 2021-01-03 | Discharge: 2021-01-03 | Disposition: A | Discharge status: Home / Self Care | Payer: Medicare Other | Source: Ambulatory Visit | Attending: Internal Medicine | Admitting: Internal Medicine

## 2021-01-03 ENCOUNTER — Other Ambulatory Visit: Payer: Self-pay

## 2021-01-03 DIAGNOSIS — J849 Interstitial pulmonary disease, unspecified: Secondary | ICD-10-CM | POA: Diagnosis not present

## 2021-01-03 NOTE — Progress Notes (Signed)
Daily Session Note  Patient Details  Name: Reginald Davis MRN: [id number] Date of Birth: Jul 23, 1946 Referring Provider:   April Manson Pulmonary Rehab Walk Test from 10/31/2020 in [place]  Referring Provider Dr. Chase Caller      Encounter Date: 01/03/2021  Check In:  Session Check In - 01/03/21 1123      Check-In   Supervising physician immediately available to respond to emergencies [facility] immediately available    Physician(s) Dr. Tawanna Solo    Location [facility]    Staff Present Rosebud Poles, RN, BSN;Janyia Guion Ysidro Evert, RN;Jessica Hassell Done, MS, ACSM-CEP, Exercise Physiologist    Virtual Visit No    Medication changes reported     No    Fall or balance concerns reported    No    Tobacco Cessation No Change    Warm-up and Cool-down Performed on first and last piece of equipment    Resistance Training Performed Yes    VAD Patient? No    PAD/SET Patient? No      Pain Assessment   Currently in Pain? No/denies    Multiple Pain Sites No           Capillary Blood Glucose: No results found for this or any previous visit (from the past 24 hour(s)).    Social History   Tobacco Use  Smoking Status Former Smoker  . Packs/day: 2.00  . Years: 20.00  . Pack years: 40.00  . Types: Cigarettes  . Quit date: 27  . Years since quitting: 25.3  Smokeless Tobacco Never Used    Goals Met:  Exercise tolerated well No report of cardiac concerns or symptoms Strength training completed today  Goals Unmet:  Not Applicable  Comments: Service time is from 0935 to [place]    Dr. Fransico Him is Medical Director for Cardiac Rehab at [facility]

## 2021-01-03 NOTE — Progress Notes (Signed)
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 Changes):  Exercise Prescription Changes - 12/27/20 1300      Response to Exercise   Blood Pressure (Admit) 136/80    Blood Pressure (Exercise) 180/80    Blood Pressure (Exit) 126/64    Heart Rate (Admit) 62 bpm    Heart Rate (Exercise) 100 bpm    Heart Rate (Exit) 83 bpm    Oxygen Saturation (Admit) 95 %    Oxygen Saturation (Exercise) 92 %    Oxygen Saturation (Exit) 95 %    Rating of Perceived Exertion (Exercise) 13    Perceived Dyspnea (Exercise) 1.5    Duration Continue with 30 min of aerobic exercise without signs/symptoms of physical distress.    Intensity THRR unchanged      Progression   Progression Continue to progress workloads to maintain intensity without signs/symptoms of  physical distress.      Resistance Training   Training Prescription Yes    Weight blue bands    Reps 10-15    Time 10 Minutes      Oxygen  Oxygen Continuous    Liters 3      Recumbant Bike   Level 3.5    Minutes 15    METs 3.3      Track   Laps 21    Minutes 15           Nutrition:  Target Goals: Understanding of nutrition guidelines, daily intake of sodium <1585m, cholesterol <2026m calories 30% from fat and 7% or less from saturated fats, daily to have 5 or more servings of fruits and vegetables.  Biometrics:  Pre Biometrics - 10/31/20 1002      Pre Biometrics   Grip Strength 30 kg            Nutrition Therapy Plan and Nutrition Goals:  Nutrition Therapy & Goals - 11/10/20 1210      Nutrition Therapy   Diet TLC      Personal Nutrition Goals   Nutrition Goal Pt to identify food quantities necessary to achieve weight loss of 6-10 lb at graduation from pulmonary rehab.    Personal Goal #2 Pt to build a healthy plate including vegetables, fruits, whole grains, and low-fat dairy products in a heart healthy meal plan.      Intervention Plan   Intervention Prescribe, educate and counsel regarding individualized specific dietary modifications aiming towards targeted core components such as weight, hypertension, lipid management, diabetes, heart failure and other comorbidities.    Expected Outcomes Short Term Goal: Understand basic principles of dietary content, such as calories, fat, sodium, cholesterol and nutrients.           Nutrition Assessments:  MEDIFICTS Score Key:  ?70 Need to make dietary changes   40-70 Heart Healthy Diet  ? 40 Therapeutic Level Cholesterol Diet  Flowsheet Row PULMONARY REHAB OTHER RESPIRAT[facility] on [facility] 73     Picture Your Plate Scores:  <4<63nhealthy dietary pattern with much room for improvement.  41-50 Dietary pattern  unlikely to meet recommendations for good health and room for improvement.  51-60 More healthful dietary pattern, with some room for improvement.   >60 Healthy dietary pattern, although there may be some specific behaviors that could be improved.    Nutrition Goals Re-Evaluation:  Nutrition Goals Re-Evaluation    [place] 11/10/20 1211 11/30/20 1510 12/30/20 0818         Goals   Current Weight 175 lb (79.4 kg) 176 lb 5.9 oz (80 kg) 173 lb 11.6 oz (78.8 kg)     Nutrition Goal Pt to identify food quantities necessary to achieve weight loss of 6-10 lb at graduation from pulmonary rehab. Pt to identify food quantities necessary to achieve weight loss of 6-10 lb at graduation from pulmonary rehab. Pt to identify food quantities necessary to achieve weight loss of 6-10 lb at graduation from pulmonary rehab.     Comment -- -- Weight loss 2.6 lbs since starting pulmonary rehab           Personal Goal #2 Re-Evaluation   Personal Goal #2 Pt to build a healthy plate including vegetables, fruits, whole grains, and low-fat dairy products in a heart healthy meal plan. Pt to build a healthy plate including vegetables, fruits, whole grains, and low-fat dairy products in a heart healthy meal plan. Pt to build a healthy plate including vegetables, fruits, whole grains, and low-fat dairy products in a heart healthy meal plan.  Nutrition Goals Discharge (Final Nutrition Goals Re-Evaluation):  Nutrition Goals Re-Evaluation - 12/30/20 0818      Goals   Current Weight 173 lb 11.6 oz (78.8 kg)    Nutrition Goal Pt to identify food quantities necessary to achieve weight loss of 6-10 lb at graduation from pulmonary rehab.    Comment Weight loss 2.6 lbs since starting pulmonary rehab      Personal Goal #2 Re-Evaluation   Personal Goal #2 Pt to build a healthy plate including vegetables, fruits, whole grains, and low-fat dairy products in a heart healthy meal plan.            Psychosocial: Target Goals: Acknowledge presence or absence of significant depression and/or stress, maximize coping skills, provide positive support system. Participant is able to verbalize types and ability to use techniques and skills needed for reducing stress and depression.  Initial Review & Psychosocial Screening:  Initial Psych Review & Screening - 10/31/20 1019      Initial Review   Current issues with None Identified      Family Dynamics   Good Support System? Yes   wife     Barriers   Psychosocial barriers to participate in program There are no identifiable barriers or psychosocial needs.      Screening Interventions   Interventions Encouraged to exercise           Quality of Life Scores:  Scores of 19 and below usually indicate a poorer quality of life in these areas.  A difference of  2-3 points is a clinically meaningful difference.  A difference of 2-3 points in the total score of the Quality of Life Index has been associated with significant improvement in overall quality of life, self-image, physical symptoms, and general health in studies assessing change in quality of life.  PHQ-9: Recent Review Flowsheet Data    Depression screen [facility] 2/9 10/31/2020   Decreased Interest 0   Down, Depressed, Hopeless 0   PHQ - 2 Score 0   Altered sleeping 0   Tired, decreased energy 0   Change in appetite 0   Feeling bad or failure about yourself  0   Trouble concentrating 0   Moving slowly or fidgety/restless 0   Suicidal thoughts 0   Difficult doing work/chores Not difficult at all     Interpretation of Total Score  Total Score Depression Severity:  1-4 = Minimal depression, 5-9 = Mild depression, 10-14 = Moderate depression, 15-19 = Moderately severe depression, 20-27 = Severe depression   Psychosocial Evaluation and Intervention:  Psychosocial Evaluation - 10/31/20 1020      Psychosocial Evaluation & Interventions   Interventions Encouraged to exercise with  the program and follow exercise prescription    Comments No concerns identified    Continue Psychosocial Services  No Follow up required           Psychosocial Re-Evaluation:  Psychosocial Re-Evaluation    [place] Name 11/07/20 1355 12/05/20 1114 12/27/20 0824         Psychosocial Re-Evaluation   Current issues with None Identified None Identified None Identified     Comments No concerns identified No concerns identified at this time No psychosocial concerns identified at this time.     Expected Outcomes For Chan to continue to be free of psychosocial concerns while in pulmonary rehab. For Harry to continue to be free of psychosocial concerns while participating in pulmonary rehab For Johnmatthew to continue to deal with stress in healthy ways.  Interventions Encouraged to attend Pulmonary Rehabilitation for the exercise Encouraged to attend Pulmonary Rehabilitation for the exercise Encouraged to attend Pulmonary Rehabilitation for the exercise     Continue Psychosocial Services  No Follow up required No Follow up required No Follow up required            Psychosocial Discharge (Final Psychosocial Re-Evaluation):  Psychosocial Re-Evaluation - 12/27/20 0109      Psychosocial Re-Evaluation   Current issues with None Identified    Comments No psychosocial concerns identified at this time.    Expected Outcomes For Mackie to continue to deal with stress in healthy ways.    Interventions Encouraged to attend Pulmonary Rehabilitation for the exercise    Continue Psychosocial Services  No Follow up required           Education: Education Goals: Education classes will be provided on a weekly basis, covering required topics. Participant will state understanding/return demonstration of topics presented.  Learning Barriers/Preferences:  Learning Barriers/Preferences - 10/31/20 1020      Learning Barriers/Preferences   Learning Barriers None    Learning Preferences Computer/Internet            Education Topics: Risk Factor Reduction:  -Group instruction that is supported by a PowerPoint presentation. Instructor discusses the definition of a risk factor, different risk factors for pulmonary disease, and how the heart and lungs work together.   Flowsheet Row PULMONARY REHAB OTHER RESPIRATORY from 12/15/2020 in [place]  Date 12/08/20  Educator Handout-Jess  Instruction Review Code 1- Verbalizes Understanding      Nutrition for Pulmonary Patient:  -Group instruction provided by PowerPoint slides, verbal discussion, and written materials to support subject matter. The instructor gives an explanation and review of healthy diet recommendations, which includes a discussion on weight management, recommendations for fruit and vegetable consumption, as well as protein, fluid, caffeine, fiber, sodium, sugar, and alcohol. Tips for eating when patients are short of breath are discussed.   Pursed Lip Breathing:  -Group instruction that is supported by demonstration and informational handouts. Instructor discusses the benefits of pursed lip and diaphragmatic breathing and detailed demonstration on how to preform both.   Flowsheet Row PULMONARY REHAB OTHER RESPIRATORY from 12/15/2020 in [place]  Date 12/15/20  Educator Handout      Oxygen Safety:  -Group instruction provided by PowerPoint, verbal discussion, and written material to support subject matter. There is an overview of "What is Oxygen" and "Why do we need it".  Instructor also reviews how to create a safe environment for oxygen use, the importance of using oxygen as prescribed, and the risks of noncompliance. There is a brief discussion on traveling with oxygen and resources the patient may utilize.   Oxygen Equipment:  -Group instruction provided by [facility] Staff utilizing handouts, written materials, and equipment demonstrations.   Signs and  Symptoms:  -Group instruction provided by written material and verbal discussion to support subject matter. Warning signs and symptoms of infection, stroke, and heart attack are reviewed and when to call the physician/911 reinforced. Tips for preventing the spread of infection discussed.   Advanced Directives:  -Group instruction provided by verbal instruction and written material to support subject matter. Instructor reviews Advanced Directive laws and proper instruction for filling out document.   Pulmonary Video:  -Group video education that reviews the importance of medication and oxygen compliance, exercise, good nutrition, pulmonary hygiene, and pursed lip and diaphragmatic breathing for the pulmonary  patient.   Exercise for the Pulmonary Patient:  -Group instruction that is supported by a PowerPoint presentation. Instructor discusses benefits of exercise, core components of exercise, frequency, duration, and intensity of an exercise routine, importance of utilizing pulse oximetry during exercise, safety while exercising, and options of places to exercise outside of rehab.     Pulmonary Medications:  -Verbally interactive group education provided by instructor with focus on inhaled medications and proper administration.   Anatomy and Physiology of the Respiratory System and Intimacy:  -Group instruction provided by PowerPoint, verbal discussion, and written material to support subject matter. Instructor reviews respiratory cycle and anatomical components of the respiratory system and their functions. Instructor also reviews differences in obstructive and restrictive respiratory diseases with examples of each. Intimacy, Sex, and Sexuality differences are reviewed with a discussion on how relationships can change when diagnosed with pulmonary disease. Common sexual concerns are reviewed. Flowsheet Row PULMONARY REHAB OTHER RESPIRATORY from 12/15/2020 in [place]  Date 11/17/20  Educator Handout      MD DAY -A group question and answer session with a medical doctor that allows participants to ask questions that relate to their pulmonary disease state.   OTHER EDUCATION -Group or individual verbal, written, or video instructions that support the educational goals of the pulmonary rehab program.   Holiday Eating Survival Tips:  -Group instruction provided by PowerPoint slides, verbal discussion, and written materials to support subject matter. The instructor gives patients tips, tricks, and techniques to help them not only survive but enjoy the holidays despite the onslaught of food that accompanies the holidays.   Knowledge Questionnaire Score:  Knowledge Questionnaire Score - 10/31/20 1020      Knowledge Questionnaire Score   Pre Score 17/18           Core Components/Risk Factors/Patient Goals at Admission:  Personal Goals and Risk Factors at Admission - 10/31/20 1020      Core Components/Risk Factors/Patient Goals on Admission   Improve shortness of breath with ADL's Yes    Intervention Provide education, individualized exercise plan and daily activity instruction to help decrease symptoms of SOB with activities of daily living.    Expected Outcomes Short Term: Improve cardiorespiratory fitness to achieve a reduction of symptoms when performing ADLs;Long Term: Be able to perform more ADLs without symptoms or delay the onset of symptoms           Core Components/Risk Factors/Patient Goals Review:   Goals and Risk Factor Review    Row Name 10/31/20 1021 11/07/20 1356 12/05/20 1116 12/27/20 0825       Core Components/Risk Factors/Patient Goals Review   Personal Goals Review Develop more efficient breathing techniques such as purse lipped breathing and diaphragmatic breathing and practicing self-pacing with activity.;Increase knowledge of respiratory medications and ability to use respiratory devices properly.;Improve  shortness of breath with ADL's Improve shortness of breath with ADL's;Increase knowledge of respiratory medications and ability to use respiratory devices properly.;Develop more efficient breathing techniques such as purse lipped breathing and diaphragmatic breathing and practicing self-pacing with activity. Develop more efficient breathing techniques such as purse lipped breathing and diaphragmatic breathing and practicing self-pacing with activity.;Increase knowledge of respiratory medications and ability to use respiratory devices properly.;Improve shortness of breath with ADL's Develop more efficient breathing techniques such as purse lipped breathing and diaphragmatic breathing and practicing self-pacing with activity.;Increase knowledge of respiratory medications and ability to use respiratory devices properly.;Improve shortness of breath with ADL's    Review --  Labib will begin exercising 11/08/2020, has not started yet. Kortney is exercising on 3L of pulsed supplemental oxygen which has allowed him to exercise at a higher level, he also sleeps with oxygen at night. Romaldo has improved his deconditioning while in pulmonary rehab, he has learned what is safe and appropriate while exercising.  He also has learned that he requires supplemental oxygen @ 3L/min pulsed on his POC.  He works very hard while in pulmonary rehab.    Expected Outcomes -- See admission goals. See admission goals. For Dilan to continue exercising independently at home after graduating which is 01/05/2021.           Core Components/Risk Factors/Patient Goals at Discharge (Final Review):   Goals and Risk Factor Review - 12/27/20 0825      Core Components/Risk Factors/Patient Goals Review   Personal Goals Review Develop more efficient breathing techniques such as purse lipped breathing and diaphragmatic breathing and practicing self-pacing with activity.;Increase knowledge of respiratory medications and ability to use respiratory  devices properly.;Improve shortness of breath with ADL's    Review Thelbert has improved his deconditioning while in pulmonary rehab, he has learned what is safe and appropriate while exercising.  He also has learned that he requires supplemental oxygen @ 3L/min pulsed on his POC.  He works very hard while in pulmonary rehab.    Expected Outcomes For [place] to continue exercising independently at home after graduating which is 01/05/2021.           ITP Comments:   Comments: ITP REVIEW Pt is making expected progress toward pulmonary rehab goals after completing 15 sessions. Recommend continued exercise, life style modification, education, and utilization of breathing techniques to increase stamina and strength and decrease shortness of breath with exertion.

## 2021-01-04 ENCOUNTER — Encounter (HOSPITAL_COMMUNITY): Payer: Self-pay | Admitting: *Deleted

## 2021-01-04 ENCOUNTER — Ambulatory Visit (HOSPITAL_COMMUNITY)
Admission: EM | Admit: 2021-01-04 | Discharge: 2021-01-05 | Disposition: A | Discharge status: Home / Self Care | Payer: Medicare Other | Attending: Cardiology | Admitting: Cardiology

## 2021-01-04 ENCOUNTER — Encounter (HOSPITAL_COMMUNITY)
Admission: EM | Disposition: A | Discharge status: Home / Self Care | Payer: Self-pay | Source: Home / Self Care | Attending: Emergency Medicine

## 2021-01-04 ENCOUNTER — Emergency Department (HOSPITAL_COMMUNITY): Payer: Medicare Other

## 2021-01-04 ENCOUNTER — Other Ambulatory Visit: Payer: Self-pay

## 2021-01-04 ENCOUNTER — Telehealth (HOSPITAL_COMMUNITY): Payer: Self-pay | Admitting: Internal Medicine

## 2021-01-04 DIAGNOSIS — Z20822 Contact with and (suspected) exposure to covid-19: Secondary | ICD-10-CM | POA: Diagnosis not present

## 2021-01-04 DIAGNOSIS — Z95818 Presence of other cardiac implants and grafts: Secondary | ICD-10-CM

## 2021-01-04 DIAGNOSIS — H5462 Unqualified visual loss, left eye, normal vision right eye: Secondary | ICD-10-CM | POA: Diagnosis not present

## 2021-01-04 DIAGNOSIS — Z7982 Long term (current) use of aspirin: Secondary | ICD-10-CM | POA: Diagnosis not present

## 2021-01-04 DIAGNOSIS — I442 Atrioventricular block, complete: Secondary | ICD-10-CM | POA: Diagnosis present

## 2021-01-04 DIAGNOSIS — I10 Essential (primary) hypertension: Secondary | ICD-10-CM | POA: Insufficient documentation

## 2021-01-04 DIAGNOSIS — I447 Left bundle-branch block, unspecified: Secondary | ICD-10-CM | POA: Insufficient documentation

## 2021-01-04 DIAGNOSIS — R55 Syncope and collapse: Secondary | ICD-10-CM | POA: Insufficient documentation

## 2021-01-04 DIAGNOSIS — I2510 Atherosclerotic heart disease of native coronary artery without angina pectoris: Secondary | ICD-10-CM | POA: Insufficient documentation

## 2021-01-04 DIAGNOSIS — Z79899 Other long term (current) drug therapy: Secondary | ICD-10-CM | POA: Insufficient documentation

## 2021-01-04 DIAGNOSIS — Z87891 Personal history of nicotine dependence: Secondary | ICD-10-CM | POA: Insufficient documentation

## 2021-01-04 DIAGNOSIS — R231 Pallor: Secondary | ICD-10-CM | POA: Diagnosis not present

## 2021-01-04 HISTORY — PX: PACEMAKER IMPLANT: EP1218

## 2021-01-04 LAB — CBC WITH DIFFERENTIAL/PLATELET
Abs Immature Granulocytes: 0.02 10*3/uL (ref 0.00–0.07)
Basophils Absolute: 0.1 10*3/uL (ref 0.0–0.1)
Basophils Relative: 1 %
Eosinophils Absolute: 0.1 10*3/uL (ref 0.0–0.5)
Eosinophils Relative: 2 %
HCT: 43.1 % (ref 39.0–52.0)
Hemoglobin: 14.3 g/dL (ref 13.0–17.0)
Immature Granulocytes: 0 %
Lymphocytes Relative: 24 %
Lymphs Abs: 2.1 10*3/uL (ref 0.7–4.0)
MCH: 33.4 pg (ref 26.0–34.0)
MCHC: 33.2 g/dL (ref 30.0–36.0)
MCV: 100.7 fL — ABNORMAL HIGH (ref 80.0–100.0)
Monocytes Absolute: 0.6 10*3/uL (ref 0.1–1.0)
Monocytes Relative: 7 %
Neutro Abs: 5.7 10*3/uL (ref 1.7–7.7)
Neutrophils Relative %: 66 %
Platelets: 285 10*3/uL (ref 150–400)
RBC: 4.28 MIL/uL (ref 4.22–5.81)
RDW: 13.3 % (ref 11.5–15.5)
WBC: 8.7 10*3/uL (ref 4.0–10.5)
nRBC: 0 % (ref 0.0–0.2)

## 2021-01-04 LAB — COMPREHENSIVE METABOLIC PANEL
ALT: 22 U/L (ref 0–44)
AST: 26 U/L (ref 15–41)
Albumin: 3.7 g/dL (ref 3.5–5.0)
Alkaline Phosphatase: 37 U/L — ABNORMAL LOW (ref 38–126)
Anion gap: 11 (ref 5–15)
BUN: 14 mg/dL (ref 8–23)
CO2: 22 mmol/L (ref 22–32)
Calcium: 8.9 mg/dL (ref 8.9–10.3)
Chloride: 103 mmol/L (ref 98–111)
Creatinine, Ser: 0.9 mg/dL (ref 0.61–1.24)
GFR, Estimated: >60 mL/min (ref >60)
Glucose, Bld: 126 mg/dL — ABNORMAL HIGH (ref 70–99)
Potassium: 4.4 mmol/L (ref 3.5–5.1)
Sodium: 136 mmol/L (ref 135–145)
Total Bilirubin: 0.5 mg/dL (ref 0.3–1.2)
Total Protein: 6.4 g/dL — ABNORMAL LOW (ref 6.5–8.1)

## 2021-01-04 LAB — TROPONIN I (HIGH SENSITIVITY)
Troponin I (High Sensitivity): 5 ng/L (ref <18)
Troponin I (High Sensitivity): 10 ng/L (ref <18)

## 2021-01-04 LAB — RESP PANEL BY RT-PCR (FLU A&B, COVID) ARPGX2
Influenza A by PCR: NEGATIVE
Influenza B by PCR: NEGATIVE
SARS Coronavirus 2 by RT PCR: NEGATIVE

## 2021-01-04 SURGERY — PACEMAKER IMPLANT

## 2021-01-04 MED ORDER — LIDOCAINE HCL (PF) 1 % IJ SOLN
INTRAMUSCULAR | Status: AC
  Filled 2021-01-04: qty 60

## 2021-01-04 MED ORDER — ACETAMINOPHEN 325 MG PO TABS
325.0000 mg | ORAL_TABLET | ORAL | Status: DC | PRN
Start: 2021-01-04 — End: 2021-01-05
  Administered 2021-01-05: 650 mg via ORAL
  Filled 2021-01-04: qty 2

## 2021-01-04 MED ORDER — ASCORBIC ACID 500 MG PO TABS
500.0000 mg | ORAL_TABLET | Freq: Every day | ORAL | Status: DC
  Administered 2021-01-05: 500 mg via ORAL
  Filled 2021-01-04: qty 1

## 2021-01-04 MED ORDER — CEFAZOLIN SODIUM-DEXTROSE 1-4 GM/50ML-% IV SOLN
1.0000 g | Freq: Four times a day (QID) | INTRAVENOUS | Status: AC
  Administered 2021-01-04 – 2021-01-05 (×3): 1 g via INTRAVENOUS
  Filled 2021-01-04 (×4): qty 50

## 2021-01-04 MED ORDER — SODIUM CHLORIDE 0.9 % IV SOLN: INTRAVENOUS | Status: DC | PRN

## 2021-01-04 MED ORDER — COLCHICINE 0.6 MG PO TABS: 0.6000 mg | ORAL_TABLET | Freq: Two times a day (BID) | ORAL | Status: DC | PRN

## 2021-01-04 MED ORDER — FENTANYL CITRATE (PF) 100 MCG/2ML IJ SOLN
INTRAMUSCULAR | Status: DC | PRN
  Administered 2021-01-04: 25 ug via INTRAVENOUS

## 2021-01-04 MED ORDER — FENTANYL CITRATE (PF) 100 MCG/2ML IJ SOLN
INTRAMUSCULAR | Status: AC
  Filled 2021-01-04: qty 2

## 2021-01-04 MED ORDER — ONDANSETRON HCL 4 MG/2ML IJ SOLN: 4.0000 mg | Freq: Four times a day (QID) | INTRAMUSCULAR | Status: DC | PRN

## 2021-01-04 MED ORDER — SODIUM CHLORIDE 0.9 % IV SOLN
INTRAVENOUS | Status: AC
  Filled 2021-01-04: qty 2

## 2021-01-04 MED ORDER — MIDAZOLAM HCL 5 MG/5ML IJ SOLN
INTRAMUSCULAR | Status: AC
  Filled 2021-01-04: qty 5

## 2021-01-04 MED ORDER — CEFAZOLIN SODIUM-DEXTROSE 2-4 GM/100ML-% IV SOLN
INTRAVENOUS | Status: AC
  Filled 2021-01-04: qty 100

## 2021-01-04 MED ORDER — DORZOLAMIDE HCL-TIMOLOL MAL 2-0.5 % OP SOLN
2.0000 [drp] | Freq: Every day | OPHTHALMIC | Status: DC
  Administered 2021-01-05: 2 [drp] via OPHTHALMIC
  Filled 2021-01-04: qty 10

## 2021-01-04 MED ORDER — GUAIFENESIN ER 600 MG PO TB12
1200.0000 mg | ORAL_TABLET | Freq: Two times a day (BID) | ORAL | Status: DC
  Administered 2021-01-05 (×2): 1200 mg via ORAL
  Filled 2021-01-04 (×2): qty 2

## 2021-01-04 MED ORDER — COENZYME Q10 300 MG PO CAPS: 1.0000 | ORAL_CAPSULE | Freq: Every day | ORAL | Status: DC

## 2021-01-04 MED ORDER — SODIUM CHLORIDE 0.9 % IV SOLN: 80.0000 mg | INTRAVENOUS | Status: DC

## 2021-01-04 MED ORDER — HEPARIN (PORCINE) IN NACL 1000-0.9 UT/500ML-% IV SOLN
INTRAVENOUS | Status: DC | PRN
  Administered 2021-01-04: 500 mL

## 2021-01-04 MED ORDER — PIRFENIDONE 801 MG PO TABS
1.0000 | ORAL_TABLET | Freq: Three times a day (TID) | ORAL | Status: DC
  Administered 2021-01-04 – 2021-01-05 (×2): 1 via ORAL
  Filled 2021-01-04 (×3): qty 1

## 2021-01-04 MED ORDER — SODIUM CHLORIDE 0.9 % IV BOLUS
500.0000 mL | Freq: Once | INTRAVENOUS | Status: AC
  Administered 2021-01-04: 500 mL via INTRAVENOUS

## 2021-01-04 MED ORDER — LIDOCAINE HCL (PF) 1 % IJ SOLN
INTRAMUSCULAR | Status: DC | PRN
  Administered 2021-01-04: 45 mL

## 2021-01-04 MED ORDER — ASPIRIN 81 MG PO CHEW
81.0000 mg | CHEWABLE_TABLET | Freq: Every day | ORAL | Status: DC
  Administered 2021-01-05: 81 mg via ORAL
  Filled 2021-01-04: qty 1

## 2021-01-04 MED ORDER — ALIROCUMAB 150 MG/ML ~~LOC~~ SOAJ: 150.0000 mg | SUBCUTANEOUS | Status: DC

## 2021-01-04 MED ORDER — DOXYCYCLINE HYCLATE 100 MG PO CAPS: 100.0000 mg | ORAL_CAPSULE | ORAL | Status: DC

## 2021-01-04 MED ORDER — PREDNISOLONE ACETATE 1 % OP SUSP
1.0000 [drp] | Freq: Every day | OPHTHALMIC | Status: DC
  Administered 2021-01-05: 1 [drp] via OPHTHALMIC
  Filled 2021-01-04: qty 5

## 2021-01-04 MED ORDER — IRBESARTAN 300 MG PO TABS
300.0000 mg | ORAL_TABLET | Freq: Every day | ORAL | Status: DC
  Administered 2021-01-05: 300 mg via ORAL
  Filled 2021-01-04: qty 1

## 2021-01-04 MED ORDER — CEFAZOLIN SODIUM-DEXTROSE 2-4 GM/100ML-% IV SOLN
2.0000 g | INTRAVENOUS | Status: AC
  Administered 2021-01-04: 2 g via INTRAVENOUS

## 2021-01-04 MED ORDER — HEPARIN SODIUM (PORCINE) 1000 UNIT/ML IJ SOLN
INTRAMUSCULAR | Status: AC
  Filled 2021-01-04: qty 1

## 2021-01-04 MED ORDER — CHLORHEXIDINE GLUCONATE 4 % EX LIQD
60.0000 mL | Freq: Once | CUTANEOUS | Status: DC
  Filled 2021-01-04: qty 60

## 2021-01-04 MED ORDER — SODIUM CHLORIDE 0.9 % IV SOLN: 250.0000 mL | INTRAVENOUS | Status: DC

## 2021-01-04 MED ORDER — SODIUM CHLORIDE 0.9% FLUSH: 3.0000 mL | INTRAVENOUS | Status: DC | PRN

## 2021-01-04 MED ORDER — ALLOPURINOL 100 MG PO TABS
100.0000 mg | ORAL_TABLET | Freq: Two times a day (BID) | ORAL | Status: DC
  Administered 2021-01-05 (×2): 100 mg via ORAL
  Filled 2021-01-04 (×2): qty 1

## 2021-01-04 MED ORDER — PREDNISONE 10 MG PO TABS: 10.0000 mg | ORAL_TABLET | ORAL | Status: DC

## 2021-01-04 MED ORDER — PANTOPRAZOLE SODIUM 40 MG PO TBEC
40.0000 mg | DELAYED_RELEASE_TABLET | Freq: Every day | ORAL | Status: DC
  Administered 2021-01-05: 40 mg via ORAL
  Filled 2021-01-04 (×2): qty 1

## 2021-01-04 MED ORDER — CIPROFLOXACIN HCL 500 MG PO TABS: 500.0000 mg | ORAL_TABLET | ORAL | Status: DC

## 2021-01-04 MED ORDER — ANIMAL SHAPES WITH C & FA PO CHEW
1.0000 | CHEWABLE_TABLET | Freq: Every day | ORAL | Status: DC
  Administered 2021-01-05: 1 via ORAL
  Filled 2021-01-04: qty 1

## 2021-01-04 MED ORDER — SODIUM CHLORIDE 0.9% FLUSH
3.0000 mL | Freq: Two times a day (BID) | INTRAVENOUS | Status: DC
  Administered 2021-01-04: 3 mL via INTRAVENOUS

## 2021-01-04 MED ORDER — HEPARIN (PORCINE) IN NACL 1000-0.9 UT/500ML-% IV SOLN
INTRAVENOUS | Status: AC
  Filled 2021-01-04: qty 500

## 2021-01-04 MED ORDER — ALBUTEROL SULFATE HFA 108 (90 BASE) MCG/ACT IN AERS: 1.0000 | INHALATION_SPRAY | Freq: Four times a day (QID) | RESPIRATORY_TRACT | Status: DC | PRN

## 2021-01-04 MED ORDER — MIDAZOLAM HCL 5 MG/5ML IJ SOLN
INTRAMUSCULAR | Status: DC | PRN
  Administered 2021-01-04: 1 mg via INTRAVENOUS

## 2021-01-04 MED ORDER — UMECLIDINIUM BROMIDE 62.5 MCG/INH IN AEPB
1.0000 | INHALATION_SPRAY | Freq: Every day | RESPIRATORY_TRACT | Status: DC
  Filled 2021-01-04: qty 7

## 2021-01-04 MED ORDER — SODIUM CHLORIDE 0.9 % IV SOLN: INTRAVENOUS | Status: DC

## 2021-01-04 MED ORDER — ATORVASTATIN CALCIUM 10 MG PO TABS
10.0000 mg | ORAL_TABLET | Freq: Every day | ORAL | Status: DC
  Administered 2021-01-05: 10 mg via ORAL
  Filled 2021-01-04: qty 1

## 2021-01-04 SURGICAL SUPPLY — 8 items
CABLE SURGICAL S-101-97-12 (CABLE) ×4 IMPLANT
LEAD TENDRIL MRI 52CM LPA1200M (Lead) ×1 IMPLANT
LEAD TENDRIL MRI 58CM LPA1200M (Lead) ×1 IMPLANT
PACEMAKER ASSURITY DR-RF (Pacemaker) ×1 IMPLANT
PAD PRO RADIOLUCENT 2001M-C (PAD) ×2 IMPLANT
SHEATH 8FR PRELUDE SNAP 13 (SHEATH) ×2 IMPLANT
SHEATH PROBE COVER 6X72 (BAG) ×1 IMPLANT
TRAY PACEMAKER INSERTION (PACKS) ×2 IMPLANT

## 2021-01-04 NOTE — ED Notes (Signed)
Cardiology at bedside.

## 2021-01-04 NOTE — Discharge Summary (Addendum)
ELECTROPHYSIOLOGY PROCEDURE DISCHARGE SUMMARY    Patient ID: Reginald Davis,  MRN: [id number], DOB/AGE: 01/17/1946 75 y.o.  Admit date: 01/04/2021 Discharge date: 01/05/21  Primary Care Physician: [name], MD  Primary Cardiologist: Dr. Meda Coffee  Electrophysiologist: new, Dr. Curt Bears  Primary Discharge Diagnosis:  1. Syncope 2. CHB 3. LBBB  Secondary Discharge Diagnosis:  1. 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 HTN 4. Non-obstructive CAD   No Known Allergies   Procedures This Admission:  1.  Implantation of a SJM dual chamber PPM on 01/04/21 by Dr Curt Bears.  The patient received   [employer] Jude Medical Assurity MRI  model [id number] PPM, [employer] model Tendril MRI [id number] (serial number  [id number]) right atrial lead and a [employer] Medical model Tendril MRI [id number] (serial number  [id number]) right ventricular lead  There were no immediate post procedure complications. 2.  CXR on 01/05/21 demonstrated no pneumothorax status post device implantation.   Brief HPI: Reginald Davis is a 75 y.o. male was in his USOH felt well, did his usual exercise regime (with O2 as usual) felt well, did some things about the house, once his wife arrived home they went for a walk.  About a 100 yards he suddenly felt weak, and fainted.  He reports his wife helped him to the ground. He woke prior to EMS arrival, while his wife was calling 911.   He reports being told he fainted once or twice with EMS, has not since being in the ER. He had no CP or cardiac awareness with the event or otherwise No unusual SOB  EMS found him in CHB V rates 30's, once arrived back in SR with 1:1 conduction, stable BP  Hospital Course:  The patient labs once back were unrevealing, Timolol not felt to contribute to CHB, no other potential  nodal blocking drugs noted, his echo from may 2021 with preserved LVEF and no symptoms or new exertional  intolerances to suggest clinical change. PPM was recommended, he was admitted and underwent implantation of a PPM with details as outlined above.  He was monitored on telemetry overnight which demonstrated SR, intermittent V pacing.  Left chest was without hematoma, small area of ecchymosis.  The device was interrogated and found to be functioning normally.  CXR was obtained and demonstrated no pneumothorax status post device implantation.  Wound care, arm mobility, and restrictions were reviewed with the patient.  The patient feels well, denies any CP/SOB, with minimal site discomfort.  He was examined by Dr. Curt Bears and considered stable for discharge to home.   BP has been elevated though improved and resuming home meds Her reports good home BP control   Physical Exam: Vitals:   01/04/21 1957 01/05/21 0001 01/05/21 0402 01/05/21 1009  BP: (!) 157/95 (!) 137/96 135/85 (!) 149/80  Pulse: (!) 102 88 68 81  Resp: 18 18 18 18   Temp: 98.5 F (36.9 C) 98.2 F (36.8 C) 97.8 F (36.6 C) 97.6 F (36.4 C)  TempSrc: Oral Oral Oral Oral  SpO2: 94% 97% 95% 95%  Weight:   77.2 kg     GEN- The patient is well appearing, alert and oriented x 3 today.   HEENT: normocephalic, atraumatic; sclera clear, conjunctiva pink; hearing intact; oropharynx clear; neck supple, no JVP Lungs- CTA b/l, normal work of breathing.  No wheezes, rales, rhonchi Heart- RRR, no  murmurs, rubs or gallops, PMI not laterally displaced GI- soft, non-tender, non-distended Extremities- no clubbing, cyanosis, or edema MS- no significant deformity or atrophy Skin- warm and dry, no rash or lesion, left chest without hematoma, small area ecchymosis, inf/lateral edge Psych- euthymic mood, full affect Neuro- no gross deficits   Labs:   Lab Results  Component Value Date   WBC 8.7 01/04/2021   HGB 14.3 01/04/2021   HCT 43.1 01/04/2021   MCV 100.7 (H) 01/04/2021   PLT 285 01/04/2021    Recent Labs  Lab 01/04/21 1147  NA  136  K 4.4  CL 103  CO2 22  BUN 14  CREATININE 0.90  CALCIUM 8.9  PROT 6.4*  BILITOT 0.5  ALKPHOS 37*  ALT 22  AST 26  GLUCOSE 126*    Discharge Medications:  Allergies as of 01/05/2021   No Known Allergies      Medication List     TAKE these medications    albuterol 108 (90 Base) MCG/ACT inhaler Commonly known as: Ventolin HFA Inhale 1-2 puffs into the lungs every 6 (six) hours as needed for wheezing or shortness of breath.   allopurinol 100 MG tablet Commonly known as: ZYLOPRIM Take 1 tablet by mouth 2 (two) times daily.   Alpha-Lipoic Acid 200 MG Caps Take 1 capsule by mouth in the morning.   aspirin 81 MG tablet Take 81 mg by mouth daily.   atorvastatin 20 MG tablet Commonly known as: LIPITOR Take 10 mg by mouth at bedtime.   CENTRUM SILVER PO Take 1 tablet by mouth every evening.   Coenzyme Q10 300 MG Caps Take 1 capsule by mouth every morning.   colchicine 0.6 MG tablet Take 1 tablet by mouth 2 (two) times daily as needed (gout flare).   dorzolamide-timolol 22.3-6.8 MG/ML ophthalmic solution Commonly known as: COSOPT Place 1 drop into the right eye 2 (two) times daily.   Esbriet 801 MG Tabs Generic drug: Pirfenidone Take 1 tablet by mouth in the morning, at noon, and at bedtime.   FISH OIL CONCENTRATE PO Take 2,400 mg by mouth in the morning and at bedtime.   Guaifenesin 1200 MG Tb12 Take 1,200 mg by mouth 2 (two) times daily.   pantoprazole 40 MG tablet Commonly known as: PROTONIX Take 1 tablet (40 mg total) by mouth daily.   Praluent 150 MG/ML Soaj Generic drug: Alirocumab Inject 150 mg as directed every 30 (thirty) days.   prednisoLONE acetate 1 % ophthalmic suspension Commonly known as: PRED FORTE Place 1 drop into the right eye every evening.   Spiriva HandiHaler 18 MCG inhalation capsule Generic drug: tiotropium Place 1 capsule (18 mcg total) into inhaler and inhale daily. What changed: when to take this   Systane 0.4-0.3  % Soln Generic drug: Polyethyl Glycol-Propyl Glycol Apply 1 drop to eye daily as needed (dry eyes).   valACYclovir 1000 MG tablet Commonly known as: VALTREX Take 1 tablet by mouth daily as needed (fever blisters).   valsartan 320 MG tablet Commonly known as: DIOVAN Take 1 tablet (320 mg total) by mouth daily. What changed: when to take this   vitamin C 500 MG tablet Commonly known as: ASCORBIC ACID Take 500 mg by mouth every evening.        Disposition: Home Discharge Instructions     Diet - low sodium heart healthy   Complete by: As directed    Increase activity slowly   Complete by: As directed  Follow-up Information     [facility] [facility] Follow up.   Specialty: Cardiology Why: 01/17/21 @ 10:00AM, wound check visit Contact information: [address] [place]        Constance Haw, MD Follow up.   Specialty: Cardiology Why: 04/10/2021 @ 2:00PM Contact information: [address] [place] [address] [phone]                 Duration of Discharge Encounter: Greater than 30 minutes including physician time.  Signed, Tommye Standard, PA-C 01/05/2021 10:12 AM  I have seen and examined this patient with Tommye Standard.  Agree with above, note added to reflect my findings.  On exam, RRR, no murmurs.  She is now status post ST. Jude pacemaker.  Device functioning appropriately.  Chest x-ray and interrogation without issue.  Plan for discharge today with follow-up in device clinic.  Avaya Mcjunkins M. Jessikah Dicker MD 01/05/2021 11:55 AM

## 2021-01-04 NOTE — ED Notes (Signed)
Called [facility].  They stated they had received the labs and they were processing them now.
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
## 2021-01-04 NOTE — Discharge Instructions (Signed)
    Supplemental Discharge Instructions for  Pacemaker/Defibrillator Patients   Activity No heavy lifting or vigorous activity with your left/right arm for 6 to 8 weeks.  Do not raise your left/right arm above your head for one week.  Gradually raise your affected arm as drawn below.             01/12/21                       01/13/21                      01/14/21                    01/15/21                      __  NO DRIVING until cleared to at your wound check visit.  WOUND CARE - Keep the wound area clean and dry.  Do not get this area wet , no showers until cleared to at your wound check procedure . - The tape/steri-strips on your wound will fall off; do not pull them off.  No bandage is needed on the site.  DO  NOT apply any creams, oils, or ointments to the wound area. - If you notice any drainage or discharge from the wound, any swelling or bruising at the site, or you develop a fever > 101? F after you are discharged home, call the office at once.  Special Instructions - You are still able to use cellular telephones; use the ear opposite the side where you have your pacemaker/defibrillator.  Avoid carrying your cellular phone near your device. - When traveling through airports, show security personnel your identification card to avoid being screened in the metal detectors.  Ask the security personnel to use the hand wand. - Avoid arc welding equipment, MRI testing (magnetic resonance imaging), TENS units (transcutaneous nerve stimulators).  Call the office for questions about other devices. - Avoid electrical appliances that are in poor condition or are not properly grounded. - Microwave ovens are safe to be near or to operate.

## 2021-01-04 NOTE — ED Triage Notes (Signed)
Pt here from home via [facility] after they were called for syncope. Pt was walking on his daily am walk and   118/60 88 100% RA

## 2021-01-04 NOTE — Interval H&P Note (Signed)
History and Physical Interval Note:  01/04/2021 2:13 PM  Reginald Davis  has presented today for surgery, with the diagnosis of heart block.  The various methods of treatment have been discussed with the patient and family. After consideration of risks, benefits and other options for treatment, the patient has consented to  Procedure(s): PACEMAKER IMPLANT (N/A) as a surgical intervention.  The patient's history has been reviewed, patient examined, no change in status, stable for surgery.  I have reviewed the patient's chart and labs.  Questions were answered to the patient's satisfaction.     Nikita Humble [employer]

## 2021-01-04 NOTE — Progress Notes (Signed)
Reginald Davis has presented today for surgery, with the diagnosis of intermittent complete heart block.  The various methods of treatment have been discussed with the patient and family. After consideration of risks, benefits and other options for treatment, the patient has consented to  Procedure(s): Pacemaker implant as a surgical intervention .  Risks include but not limited to bleeding, infection, pneumothorax, perforation, tamponade, vascular damage, renal failure, MI, stroke, death, and lead dislodgement . The patient's history has been reviewed, patient examined, no change in status, stable for surgery.  I have reviewed the patient's chart and labs.  Questions were answered to the patient's satisfaction.    Harbert Fitterer Curt Bears, MD 01/04/2021 2:15 PM

## 2021-01-04 NOTE — Progress Notes (Addendum)
Called lab, results are delayed to to equipment issues, will try to fast track his labs  Patient reported that he is due for his IPF medication that he takes 3x daily, reported to RN needed to be with meals. I have ordered this for the patient.   I have discussed with our pharmacy  Med should be taken with meals and is preferred to reduce GI side effects, and best to hold off to his dinner dose. I have spoken with the ER nurse and when family brings the medicine in from home will notify the pharmacist to verify and time dosing  Tommye Standard, PA-C  Late entry Patient's wife at bedside notably quite anxious, mentions she has no memory of the events and thinks she went to a "shock" when he collapsed, I offered 3 separate times to have her evaluated in the ER with her repetitive reports of not recalling events and our conversation. The patient and the wife both decline. She denied any physical complaints.   Tommye Standard, PA-C  No HS Trop yet from 1st draw, 2nd drawn and not resulted yet either D/w Dr. Curt Bears, no anginal symptoms, has had excellent exertional capacity with symptoms including this morning. No suspicion of ACS  Tommye Standard, PA-C

## 2021-01-05 ENCOUNTER — Ambulatory Visit (HOSPITAL_COMMUNITY): Payer: Medicare Other

## 2021-01-05 ENCOUNTER — Encounter (HOSPITAL_COMMUNITY): Payer: Self-pay | Admitting: Cardiology

## 2021-01-05 ENCOUNTER — Encounter (HOSPITAL_COMMUNITY): Payer: Medicare Other

## 2021-01-05 DIAGNOSIS — Z20822 Contact with and (suspected) exposure to covid-19: Secondary | ICD-10-CM | POA: Diagnosis not present

## 2021-01-05 DIAGNOSIS — Z87891 Personal history of nicotine dependence: Secondary | ICD-10-CM | POA: Diagnosis not present

## 2021-01-05 DIAGNOSIS — I2510 Atherosclerotic heart disease of native coronary artery without angina pectoris: Secondary | ICD-10-CM | POA: Diagnosis not present

## 2021-01-05 DIAGNOSIS — I10 Essential (primary) hypertension: Secondary | ICD-10-CM | POA: Diagnosis not present

## 2021-01-05 DIAGNOSIS — H5462 Unqualified visual loss, left eye, normal vision right eye: Secondary | ICD-10-CM | POA: Diagnosis not present

## 2021-01-05 DIAGNOSIS — Z7982 Long term (current) use of aspirin: Secondary | ICD-10-CM | POA: Diagnosis not present

## 2021-01-05 DIAGNOSIS — Z79899 Other long term (current) drug therapy: Secondary | ICD-10-CM | POA: Diagnosis not present

## 2021-01-05 DIAGNOSIS — R55 Syncope and collapse: Secondary | ICD-10-CM | POA: Diagnosis not present

## 2021-01-05 DIAGNOSIS — I447 Left bundle-branch block, unspecified: Secondary | ICD-10-CM | POA: Diagnosis not present

## 2021-01-05 DIAGNOSIS — I442 Atrioventricular block, complete: Secondary | ICD-10-CM | POA: Diagnosis not present

## 2021-01-05 DIAGNOSIS — Z950 Presence of cardiac pacemaker: Secondary | ICD-10-CM | POA: Diagnosis not present

## 2021-01-05 NOTE — Plan of Care (Signed)

## 2021-01-05 NOTE — Progress Notes (Signed)
D/C instructions given and reviewed. Tele and IV removed, tolerated well. NT notified pt was ready to transport.

## 2021-01-05 NOTE — Plan of Care (Signed)
  Problem: Clinical Measurements: Goal: Will remain free from infection Outcome: Progressing   Problem: Coping: Goal: Level of anxiety will decrease Outcome: Progressing   Problem: Safety: Goal: Ability to remain free from injury will improve Outcome: Progressing   

## 2021-01-09 ENCOUNTER — Other Ambulatory Visit (INDEPENDENT_AMBULATORY_CARE_PROVIDER_SITE_OTHER): Payer: Medicare Other

## 2021-01-09 DIAGNOSIS — J849 Interstitial pulmonary disease, unspecified: Secondary | ICD-10-CM

## 2021-01-09 DIAGNOSIS — L812 Freckles: Secondary | ICD-10-CM | POA: Diagnosis not present

## 2021-01-09 DIAGNOSIS — I788 Other diseases of capillaries: Secondary | ICD-10-CM | POA: Diagnosis not present

## 2021-01-09 DIAGNOSIS — L738 Other specified follicular disorders: Secondary | ICD-10-CM | POA: Diagnosis not present

## 2021-01-09 DIAGNOSIS — D225 Melanocytic nevi of trunk: Secondary | ICD-10-CM | POA: Diagnosis not present

## 2021-01-09 DIAGNOSIS — L821 Other seborrheic keratosis: Secondary | ICD-10-CM | POA: Diagnosis not present

## 2021-01-10 LAB — HEPATIC FUNCTION PANEL
ALT: 20 U/L (ref 0–53)
AST: 22 U/L (ref 0–37)
Albumin: 4.3 g/dL (ref 3.5–5.2)
Alkaline Phosphatase: 41 U/L (ref 39–117)
Bilirubin, Direct: 0 mg/dL (ref 0.0–0.3)
Total Bilirubin: 0.3 mg/dL (ref 0.2–1.2)
Total Protein: 7 g/dL (ref 6.0–8.3)

## 2021-01-10 LAB — CBC WITH DIFFERENTIAL/PLATELET
Basophils Absolute: 0.1 10*3/uL (ref 0.0–0.1)
Basophils Relative: 1.1 % (ref 0.0–3.0)
Eosinophils Absolute: 0.3 10*3/uL (ref 0.0–0.7)
Eosinophils Relative: 3.5 % (ref 0.0–5.0)
HCT: 42.3 % (ref 39.0–52.0)
Hemoglobin: 14.1 g/dL (ref 13.0–17.0)
Lymphocytes Relative: 27.4 % (ref 12.0–46.0)
Lymphs Abs: 2.3 10*3/uL (ref 0.7–4.0)
MCHC: 33.4 g/dL (ref 30.0–36.0)
MCV: 99.7 fl (ref 78.0–100.0)
Monocytes Absolute: 0.7 10*3/uL (ref 0.1–1.0)
Monocytes Relative: 8.7 % (ref 3.0–12.0)
Neutro Abs: 5 10*3/uL (ref 1.4–7.7)
Neutrophils Relative %: 59.3 % (ref 43.0–77.0)
Platelets: 261 10*3/uL (ref 150.0–400.0)
RBC: 4.24 Mil/uL (ref 4.22–5.81)
RDW: 14 % (ref 11.5–15.5)
WBC: 8.4 10*3/uL (ref 4.0–10.5)

## 2021-01-11 NOTE — Telephone Encounter (Signed)
Not sure if fibrosis of lung and fibrosis of the cardica conduction system are related in anyway . In other words, IPF patients-> do they have excess Complete heart block. I do not know. Interesting question. When I am at conference next week I will inquire   Regarding the heart block and the pliant trials - I have been in touch with [name] and the sponsor [employer] and Dr Curt Bears x past 48h   Glad he surviced the CHB and glad he is doing well

## 2021-01-11 NOTE — Telephone Encounter (Signed)
Forwarding pt email as FYI:   Good morning Dr. Chase Caller.   I wanted to make you aware that I experienced a third-stage AV Block last week, survived, and now have a pacemaker. As my research and reading finds the Merck Manual saying, "The most common cause is idiopathic fibrosis. of the conduction system." I thought I should bring this event to your attention. I don't know how my internal organs are affected in what fashion by my new and old issues, but I do want to try to keep my care team mutually informed.   I am currently scheduled for my Pulmonix visit on May 19, and an HRCT on May 20. I've communicated my new part news to [name] in case any different prep is required for my HRCT. I was pleased to see yesterday that [id number] received FDA Fast Track designation.  My regards, Reginald Davis

## 2021-01-12 ENCOUNTER — Encounter (HOSPITAL_COMMUNITY)
Admission: RE | Admit: 2021-01-12 | Discharge: 2021-01-12 | Disposition: A | Discharge status: Home / Self Care | Payer: Medicare Other | Source: Ambulatory Visit | Attending: Internal Medicine | Admitting: Internal Medicine

## 2021-01-12 ENCOUNTER — Other Ambulatory Visit: Payer: Self-pay

## 2021-01-12 DIAGNOSIS — J849 Interstitial pulmonary disease, unspecified: Secondary | ICD-10-CM | POA: Diagnosis not present

## 2021-01-12 NOTE — Progress Notes (Signed)
Daily Session Note  Patient Details  Name: QUINT CHESTNUT MRN: [id number] Date of Birth: 07-05-1946 Referring Provider:   April Manson Pulmonary Rehab Walk Test from 10/31/2020 in [facility]  Referring Provider Dr. Chase Caller      Encounter Date: 01/12/2021  Check In:  Session Check In - 01/12/21 1452      Check-In   Supervising physician immediately available to respond to emergencies [facility] immediately available    Physician(s) Dr. Arbutus Ped    Location [facility]    Staff Present Rosebud Poles, RN, BSN;Lisa Ysidro Evert, RN;Kimiko Common Hassell Done, MS, ACSM-CEP, Exercise Physiologist;Jetta Gilford Rile BS, ACSM EP-C, Exercise Physiologist    Virtual Visit No    Medication changes reported     No    Fall or balance concerns reported    No    Tobacco Cessation No Change    Warm-up and Cool-down Performed as group-led instruction    Resistance Training Performed Yes    VAD Patient? No    PAD/SET Patient? No      Pain Assessment   Currently in Pain? No/denies    Multiple Pain Sites No           Capillary Blood Glucose: No results found for this or any previous visit (from the past 24 hour(s)).    Social History   Tobacco Use  Smoking Status Former Smoker  . Packs/day: 2.00  . Years: 20.00  . Pack years: 40.00  . Types: Cigarettes  . Quit date: 22  . Years since quitting: 25.3  Smokeless Tobacco Never Used    Goals Met:  Proper associated with RPD/PD & O2 Sat Using PLB without cueing & demonstrates good technique Exercise tolerated well Completed pulmonary rehab program today  Goals Unmet:  Not Applicable  Comments: Service time is from 1430 to 1505    Dr. Fransico Him is Medical Director for Cardiac Rehab at [facility].

## 2021-01-12 NOTE — Progress Notes (Signed)
Cbc and LFT normal

## 2021-01-14 ENCOUNTER — Telehealth: Payer: Self-pay | Admitting: Physician Assistant

## 2021-01-14 NOTE — Telephone Encounter (Signed)
   Pt called because he wants to make sure he is ok.   He feels a little tired.   His HR will be at 60, the lower PPM rate during the night and at times during the day. Is this ok?  His normal daytime HR at rest has been in the mid-60s, today is at 60.   He and his wife wonder why the device is kicking in more today.   Explained that we know he has complete heart block. Pacemaker will always be needed.  If it kicks in some days more than others, no problem, just make sure his heart rate does not go below 60.  If his baseline heart rate needs to be set little higher or his rate response is inadequate, they can discuss that at his follow-up appointment on Tuesday.  Patient and wife are in agreement with this plan of care.  Rosaria Ferries, PA-C 01/14/2021 11:27 AM

## 2021-01-15 ENCOUNTER — Other Ambulatory Visit (HOSPITAL_BASED_OUTPATIENT_CLINIC_OR_DEPARTMENT_OTHER): Payer: Self-pay | Admitting: Cardiovascular Disease

## 2021-01-15 DIAGNOSIS — R0600 Dyspnea, unspecified: Secondary | ICD-10-CM

## 2021-01-15 DIAGNOSIS — I2510 Atherosclerotic heart disease of native coronary artery without angina pectoris: Secondary | ICD-10-CM

## 2021-01-15 DIAGNOSIS — R0609 Other forms of dyspnea: Secondary | ICD-10-CM

## 2021-01-17 ENCOUNTER — Other Ambulatory Visit: Payer: Self-pay

## 2021-01-17 ENCOUNTER — Ambulatory Visit (INDEPENDENT_AMBULATORY_CARE_PROVIDER_SITE_OTHER): Payer: Medicare Other | Admitting: Emergency Medicine

## 2021-01-17 ENCOUNTER — Encounter (HOSPITAL_BASED_OUTPATIENT_CLINIC_OR_DEPARTMENT_OTHER): Payer: Self-pay

## 2021-01-17 DIAGNOSIS — I442 Atrioventricular block, complete: Secondary | ICD-10-CM | POA: Diagnosis not present

## 2021-01-17 NOTE — Patient Instructions (Signed)
Please see Pacemaker patient education packet.

## 2021-01-17 NOTE — Progress Notes (Signed)
Wound check appointment. Steri-strips removed. Wound without redness or edema. Incision edges approximated, wound well healed. Normal device function. Thresholds, sensing, and impedances consistent with implant measurements. Device programmed RA lead at 3.5V & RV lead at auto capture programmed on for extra safety margin until 3 month visit. Histogram distribution appropriate for patient and level of activity. No mode switches or high ventricular rates noted. LRL decreased to 50 bpm per order from Dr. Curt Bears. Patient educated about wound care, arm mobility, lifting restrictions. ROV in 3 months with Se. Camnitz. Next scheduled remote 04/17/21.

## 2021-01-26 ENCOUNTER — Encounter: Payer: Medicare Other | Admitting: Internal Medicine

## 2021-01-26 NOTE — Progress Notes (Signed)
Discharge Progress Report  Patient Details  Name: Reginald Davis MRN: [id number] Date of Birth: 1946-07-14 Referring Provider:   [facility] Walk Test from 10/31/2020 in [place]  Referring Provider Dr. Chase Caller       Number of Visits: 16  Reason for Discharge:  Patient reached a stable level of exercise. Patient independent in their exercise. Patient has met program and personal goals.  Smoking History:  Social History   Tobacco Use  Smoking Status Former Smoker  . Packs/day: 2.00  . Years: 20.00  . Pack years: 40.00  . Types: Cigarettes  . Quit date: 89  . Years since quitting: 25.3  Smokeless Tobacco Never Used    Diagnosis:  Interstitial lung disease ([place])  ADL [facility]:  Pulmonary Assessment Scores    Row Name 10/31/20 1014 01/12/21 1547       ADL [facility]   ADL Phase Entry Exit    SOB Score total 13 16         CAT Score   CAT Score 8 9         mMRC Score   mMRC Score 2 2           Initial Exercise Prescription:  Initial Exercise Prescription - 10/31/20 1500      Date of Initial Exercise RX and Referring Provider   Date 10/31/20    Referring Provider Dr. Chase Caller    Expected Discharge Date 01/05/21      Recumbant Bike   Level 1    RPM 60    Minutes 15      Track   Minutes 15      Prescription Details   Frequency (times per week) 2    Duration Progress to 45 minutes of aerobic exercise without signs/symptoms of physical distress      Intensity   THRR 40-80% of Max Heartrate 58-117    Ratings of Perceived Exertion 11-13    Perceived Dyspnea 0-4      Progression   Progression Continue to progress workloads to maintain intensity without signs/symptoms of physical distress.      Resistance Training   Training Prescription Yes    Weight blue bands    Reps 10-15           Discharge Exercise Prescription (Final Exercise Prescription Changes):  Exercise Prescription Changes -  12/27/20 1300      Response to Exercise   Blood Pressure (Admit) 136/80    Blood Pressure (Exercise) 180/80    Blood Pressure (Exit) 126/64    Heart Rate (Admit) 62 bpm    Heart Rate (Exercise) 100 bpm    Heart Rate (Exit) 83 bpm    Oxygen Saturation (Admit) 95 %    Oxygen Saturation (Exercise) 92 %    Oxygen Saturation (Exit) 95 %    Rating of Perceived Exertion (Exercise) 13    Perceived Dyspnea (Exercise) 1.5    Duration Continue with 30 min of aerobic exercise without signs/symptoms of physical distress.    Intensity THRR unchanged      Progression   Progression Continue to progress workloads to maintain intensity without signs/symptoms of physical distress.      Resistance Training   Training Prescription Yes    Weight blue bands    Reps 10-15    Time 10 Minutes      Oxygen   Oxygen Continuous    Liters 3      Recumbant Bike   Level  3.5    Minutes 15    METs 3.3      Track   Laps 21    Minutes 15           Functional Capacity:  [place] Name 10/31/20 1530 11/08/20 1547 01/12/21 1538     6 Minute Walk   Phase Initial  Used [employer] Test data Initial Discharge   Distance 1296 feet 1350 feet 1220 feet   Distance % Change -- -- -9.63 %   Distance Feet Change -- -- -130 ft   Walk Time 6 minutes 6 minutes 6 minutes   # of Rest Breaks -- 0 0   MPH 2.45 2.56 2.31   METS 2.92 2.73 2.47   RPE -- 10 11   Perceived Dyspnea  _0 VO2 Peak 10.23 9.57 8.64   Symptoms No No Yes (comment)   Comments -- -- Pt just recently had a pacemaker inserted due to a complete heart block. Pt's incision is still healing and he was very fatigued due to recovering from that.   Resting HR 88 bpm 71 bpm 77 bpm   Resting BP 150/74 114/72 130/78   Resting Oxygen Saturation  98 % 93 % 98 %   Exercise Oxygen Saturation  during 6 min walk 89 % 84 % 96 %   Max Ex. HR 117 bpm 104 bpm 93 bpm   Max Ex. BP -- 132/80 142/82   2 Minute Post BP -- 124/74 130/82      Interval HR   1 Minute HR 102 101 90   2 Minute HR 107 104 89   3 Minute HR 108 94 92   4 Minute HR 117 90 93   5 Minute HR 111 89 91   6 Minute HR 111 99 93   2 Minute Post HR -- 75 72   Interval Heart Rate? Yes Yes Yes     Interval Oxygen   Interval Oxygen? Yes Yes Yes   Baseline Oxygen Saturation % 98 % 93 % 98 %   1 Minute Oxygen Saturation % 97 % 93 % 97 %   1 Minute Liters of Oxygen 0 L 0 L 2 L   2 Minute Oxygen Saturation % 92 % 90 % 97 %   2 Minute Liters of Oxygen 0 L 0 L 2 L   3 Minute Oxygen Saturation % 91 % 84 % 96 %   3 Minute Liters of Oxygen 0 L 0 L 2 L   4 Minute Oxygen Saturation % 89 % 92 % 96 %   4 Minute Liters of Oxygen 0 L 0 L 2 L   5 Minute Oxygen Saturation % 89 % 91 % 96 %   5 Minute Liters of Oxygen 0 L 0 L 2 L   6 Minute Oxygen Saturation % 89 % 87 % 96 %   6 Minute Liters of Oxygen 0 L 0 L 2 L   2 Minute Post Oxygen Saturation % -- 95 % 98 %   2 Minute Post Liters of Oxygen -- 0 L 2 L          Psychological, QOL, Others - Outcomes: PHQ 2/9: Depression screen [facility] 2/9 01/12/2021 10/31/2020  Decreased Interest 0 0  Down, Depressed, Hopeless 0 0  PHQ - 2 Score 0 0  Altered sleeping 0 0  Tired, decreased energy 0 0  Change in appetite 0  0  Feeling bad or failure about yourself  0 0  Trouble concentrating 0 0  Moving slowly or fidgety/restless 0 0  Suicidal thoughts 0 0  PHQ-9 Score 0 -  Difficult doing work/chores Not difficult at all Not difficult at all    Quality of Life:   Personal Goals: Goals established at orientation with interventions provided to work toward goal.  Personal Goals and Risk Factors at Admission - 10/31/20 1020      Core Components/Risk Factors/Patient Goals on Admission   Improve shortness of breath with ADL's Yes    Intervention Provide education, individualized exercise plan and daily activity instruction to help decrease symptoms of SOB with activities of daily living.    Expected Outcomes Short Term:  Improve cardiorespiratory fitness to achieve a reduction of symptoms when performing ADLs;Long Term: Be able to perform more ADLs without symptoms or delay the onset of symptoms            Personal Goals Discharge:  Goals and Risk Factor Review    Row Name 10/31/20 1021 11/07/20 1356 12/05/20 1116 12/27/20 0825       Core Components/Risk Factors/Patient Goals Review   Personal Goals Review Develop more efficient breathing techniques such as purse lipped breathing and diaphragmatic breathing and practicing self-pacing with activity.;Increase knowledge of respiratory medications and ability to use respiratory devices properly.;Improve shortness of breath with ADL's Improve shortness of breath with ADL's;Increase knowledge of respiratory medications and ability to use respiratory devices properly.;Develop more efficient breathing techniques such as purse lipped breathing and diaphragmatic breathing and practicing self-pacing with activity. Develop more efficient breathing techniques such as purse lipped breathing and diaphragmatic breathing and practicing self-pacing with activity.;Increase knowledge of respiratory medications and ability to use respiratory devices properly.;Improve shortness of breath with ADL's Develop more efficient breathing techniques such as purse lipped breathing and diaphragmatic breathing and practicing self-pacing with activity.;Increase knowledge of respiratory medications and ability to use respiratory devices properly.;Improve shortness of breath with ADL's    Review -- Reginald Davis will begin exercising 11/08/2020, has not started yet. Reginald Davis is exercising on 3L of pulsed supplemental oxygen which has allowed him to exercise at a higher level, he also sleeps with oxygen at night. Reginald Davis has improved his deconditioning while in pulmonary rehab, he has learned what is safe and appropriate while exercising.  He also has learned that he requires supplemental oxygen @ 3L/min pulsed on his  POC.  He works very hard while in pulmonary rehab.    Expected Outcomes -- See admission goals. See admission goals. For Reginald Davis to continue exercising independently at home after graduating which is 01/05/2021.           Exercise Goals and Review:  Exercise Goals    Row Name 10/31/20 1035             Exercise Goals   Increase Physical Activity Yes       Intervention Provide advice, education, support and counseling about physical activity/exercise needs.;Develop an individualized exercise prescription for aerobic and resistive training based on initial evaluation findings, risk stratification, comorbidities and participant's personal goals.       Expected Outcomes Short Term: Attend rehab on a regular basis to increase amount of physical activity.;Long Term: Add in home exercise to make exercise part of routine and to increase amount of physical activity.;Long Term: Exercising regularly at least 3-5 days a week.       Increase Strength and Stamina Yes       Intervention  Provide advice, education, support and counseling about physical activity/exercise needs.;Develop an individualized exercise prescription for aerobic and resistive training based on initial evaluation findings, risk stratification, comorbidities and participant's personal goals.       Expected Outcomes Short Term: Increase workloads from initial exercise prescription for resistance, speed, and METs.;Short Term: Perform resistance training exercises routinely during rehab and add in resistance training at home;Long Term: Improve cardiorespiratory fitness, muscular endurance and strength as measured by increased METs and functional capacity (6MWT)       Able to understand and use rate of perceived exertion (RPE) scale Yes       Intervention Provide education and explanation on how to use RPE scale       Expected Outcomes Short Term: Able to use RPE daily in rehab to express subjective intensity level;Long Term:  Able to use RPE  to guide intensity level when exercising independently       Able to understand and use Dyspnea scale Yes       Intervention Provide education and explanation on how to use Dyspnea scale       Expected Outcomes Short Term: Able to use Dyspnea scale daily in rehab to express subjective sense of shortness of breath during exertion;Long Term: Able to use Dyspnea scale to guide intensity level when exercising independently       Knowledge and understanding of Target Heart Rate Range (THRR) Yes       Intervention Provide education and explanation of THRR including how the numbers were predicted and where they are located for reference       Expected Outcomes Short Term: Able to state/look up THRR;Long Term: Able to use THRR to govern intensity when exercising independently;Short Term: Able to use daily as guideline for intensity in rehab       Understanding of Exercise Prescription Yes       Intervention Provide education, explanation, and written materials on patient's individual exercise prescription       Expected Outcomes Short Term: Able to explain program exercise prescription;Long Term: Able to explain home exercise prescription to exercise independently              Exercise Goals Re-Evaluation:  Exercise Goals Re-Evaluation    Row Name 11/07/20 1137 12/06/20 0735 12/30/20 1406         Exercise Goal Re-Evaluation   Exercise Goals Review Increase Physical Activity;Increase Strength and Stamina;Able to understand and use rate of perceived exertion (RPE) scale;Able to understand and use Dyspnea scale;Knowledge and understanding of Target Heart Rate Range (THRR);Understanding of Exercise Prescription Increase Physical Activity;Increase Strength and Stamina;Able to understand and use rate of perceived exertion (RPE) scale;Able to understand and use Dyspnea scale;Knowledge and understanding of Target Heart Rate Range (THRR);Understanding of Exercise Prescription Increase Physical Activity;Increase  Strength and Stamina;Able to understand and use rate of perceived exertion (RPE) scale;Able to understand and use Dyspnea scale;Knowledge and understanding of Target Heart Rate Range (THRR);Understanding of Exercise Prescription     Comments Pt is schedule to start exercise this week. It is too early to note any progression. Will continue to monitor and progress as he is able. Reginald Davis has completed 8 exercise sessions and has tolerated well so far. He has been consistent with workload and MET increases so far. He is independent with equipment setup and resistance band exercises. He is currently exercising at 3.32 METS walking the track and 3.2 METS on level 3 on the Recumbent bike. Will continue to monitor and progress as he  is able. Will also discuss home exercise prescription in the upcoming weeks. Reginald Davis has completed 15 exercise sessions and has been steady with workload and MET level increases. He is also encorporating home exercise and plans to continue exercise after completion of pulmonary rehab which is 01/05/21. Reginald Davis has made improvements with shortness of breath with activities and states he can tell exercise has made a positive change in his life. He is currently exercising at 3.4 METS walking the track and 3.3 METS on the bike. Will continue to monitor his progression until completion of rehab and will give him a post graduation exercise prescription.     Expected Outcomes Through exercise at rehab and home the patient will decrease shortness of breath with daily activities and feel confident in carrying out an exercise regimn at home. Through exercise at rehab and home the patient will decrease shortness of breath with daily activities and feel confident in carrying out an exercise regimn at home. Through exercise at rehab and home the patient will decrease shortness of breath with daily activities and feel confident in carrying out an exercise regimn at home.            Nutrition & Weight -  Outcomes:  Pre Biometrics - 10/31/20 1002      Pre Biometrics   Grip Strength 30 kg           Post Biometrics - 01/12/21 1538       Post  Biometrics   Grip Strength 33 kg           Nutrition:  Nutrition Therapy & Goals - 11/10/20 1210      Nutrition Therapy   Diet TLC      Personal Nutrition Goals   Nutrition Goal Pt to identify food quantities necessary to achieve weight loss of 6-10 lb at graduation from pulmonary rehab.    Personal Goal #2 Pt to build a healthy plate including vegetables, fruits, whole grains, and low-fat dairy products in a heart healthy meal plan.      Intervention Plan   Intervention Prescribe, educate and counsel regarding individualized specific dietary modifications aiming towards targeted core components such as weight, hypertension, lipid management, diabetes, heart failure and other comorbidities.    Expected Outcomes Short Term Goal: Understand basic principles of dietary content, such as calories, fat, sodium, cholesterol and nutrients.           Nutrition Discharge:   Education Questionnaire Score:  Knowledge Questionnaire Score - 01/12/21 1547      Knowledge Questionnaire Score   Post Score 18/18           Goals reviewed with patient; copy given to patient.

## 2021-01-26 NOTE — Telephone Encounter (Signed)
Please advise on patient mychart message  Dr. Chase Caller,            In April, 2021, Dr Ena Dawley started me on a prescription for daily Spiriva 18 mcg with Handihaler/ 30-day supply. My impression was that it was of some benefit, and I believe you concurred in my initial visits with you.            With Dr. Meda Coffee heading off to [place], [place] realized this past week that the original prescription has expired. I am transferring my regular cardiac care to Dr. Skeet Latch, but my initial appointment isn't until late August. Her nurse and I wonder if you would be willing to take over my Spiriva script? I first raised the notion with her since I didn't actually have a pulmonologist back when Dr. Meda Coffee did the original prescription. (And Dr. Oval Linsey is on the hospital track this week.)            Would you send in a new prescription for me to [facility] on [address]? I have a few days supply left on hand.  Thank you. My regards, Reginald Davis

## 2021-01-26 NOTE — Addendum Note (Signed)
Encounter addended by: Lance Morin, RN on: 01/26/2021 3:09 PM  Actions taken: Clinical Note Signed

## 2021-01-27 ENCOUNTER — Inpatient Hospital Stay: Admission: RE | Admit: 2021-01-27 | Payer: Medicare Other | Source: Ambulatory Visit

## 2021-01-27 ENCOUNTER — Telehealth: Payer: Self-pay

## 2021-01-27 DIAGNOSIS — R0600 Dyspnea, unspecified: Secondary | ICD-10-CM

## 2021-01-27 DIAGNOSIS — R0609 Other forms of dyspnea: Secondary | ICD-10-CM

## 2021-01-27 DIAGNOSIS — I2510 Atherosclerotic heart disease of native coronary artery without angina pectoris: Secondary | ICD-10-CM

## 2021-01-27 MED ORDER — SPIRIVA HANDIHALER 18 MCG IN CAPS: 18.0000 ug | ORAL_CAPSULE | Freq: Every day | RESPIRATORY_TRACT | 6 refills | Status: DC

## 2021-01-27 NOTE — Telephone Encounter (Signed)
In the march 2021 scan there is report of some emphysema and this is why spiriva was given. IN subsequent scan it is not mentioned. This means the level of emhysema is very mild/insignfiicant. This means the spiriva might not work. However, if it has helped him in past -> he can have script  Yes, and this comes under my purview

## 2021-01-27 NOTE — Telephone Encounter (Signed)
[facility]

## 2021-02-09 ENCOUNTER — Encounter: Payer: Medicare Other | Admitting: *Deleted

## 2021-02-09 ENCOUNTER — Ambulatory Visit (INDEPENDENT_AMBULATORY_CARE_PROVIDER_SITE_OTHER)
Admission: RE | Admit: 2021-02-09 | Discharge: 2021-02-09 | Disposition: A | Discharge status: Home / Self Care | Payer: Self-pay | Source: Ambulatory Visit | Attending: Internal Medicine | Admitting: Internal Medicine

## 2021-02-09 ENCOUNTER — Other Ambulatory Visit: Payer: Self-pay

## 2021-02-09 DIAGNOSIS — Z006 Encounter for examination for normal comparison and control in clinical research program: Secondary | ICD-10-CM

## 2021-02-09 DIAGNOSIS — J84112 Idiopathic pulmonary fibrosis: Secondary | ICD-10-CM

## 2021-02-09 NOTE — Research (Signed)
Title: A randomized, double-blind, multicenter, dose-ranging, placebo-controlled Phase 2a evaluation of the safety, tolerability and pharmacokinetics of [id number] in participants with idiopathic pulmonary fibrosis (IPF)   Patients will be randomized in a 3:1 ratio to one of two treatment arms:  320 mg [id number] or placebo.   Protocol #: [id number], Clinical Trials EUDRACT# 2019-002709-23   [email or web address]   ([place] [address])   Protocol Version Amendment 3 (ver.3.0) for 02/09/2021 Date of 19OCT2021  (double checked yes Consent Version 5.1 dated 11Jan2022, approved  for 02/09/2021 date of  (double checked yes Investigator Brochure Version 5.0  Addendum for 02/09/2021 date of 21JAN2022   Key Inclusion Criteria:   Age >/= 75 years old Confident diagnosis of IPF  up to 5 years. If IPF diagnosis is within > 3 to = 5 years at screening, the participant must have evidence of progression within the last 24 months, as defined by decline in FVC percent predicted based on a relative decline of = 5% FVC% pred >/=45%  DLCO (hgb-adjusted) >/=30% Treatment with nintedanib or pirfenidone allowed, But must have stable dose =3 months before Screening Visit and remain unchanged during the study   Key Exclusion Criteria:   Receiving any non-approved agent intended for treatment of fibrosis in IPF FEV1 over the FVC ratio < 0.7 at screening Active infection,  that can affect FVC measurement during Screening or  Randomization Extent of emphysema > fibrotic changes on most recent HRCT IPF exacerbation within 6 months of Screening  Severe pulmonary hypertension Smoking of any kind w/in 3 months of Screening or unwilling to avoid smoking throughout the study Renal impairment or end-stage kidney disease requiring dialysis History of unstable /deteriorating cardiac/pulmonary disease (other than IPF) w/in 6 months of Screening Any ongoing known malignancy, except for  localized cancers (i.e. basal cell carcinoma) Medical or surgical condition known to affect drug absorption Surgeries scheduled during the study period    Side-Effects: [id number] was generally well tolerated in over 280 healthy participants with no clinical safety concerns or dose-limiting toxicities observed for single doses up to 61m and multiple doses up to 3259mQD administered for up to 14 days. The major side-effects reported have been headache (mild) and constipation (mild) in 3-76% of subjects. Other side effects, in 1-2% of subjects, were mild to moderate, with fever being the only severe side-effect reported.  Mechanism: Selective dual aV6 and aV1 integrin inhibitor with antifibrotic activity. [id number] aV6 and aV1 from binding to the RGD sequence of latency-associated peptide (LAP), this blocks the release of activated TGF-1, thereby blocking the activation of pro-fibrotic pathways. Additionally, PLBZJ-69678as been shown to reduce collagen synthesis and gene expression in human IPF tissue.   Interactions: Acts as a substrate of P-gp, OATP1B1, OATP1B3, OAT3 Inhibitors or inducers of these enzymes may affect the intestinal absorption, hepatic uptake or renal uptake of PLLFY-10175etabolized by CYP3A4, CYP2C9, CYP2C19 Potent inhibitors or inducers of these enzymes should be avoided Each CYP enzyme metabolizes < 10% of the parent compound Since multiple CYP enzymes are involved, clinically relevant drug-drug interactions are less likely, as there are other alternative pathways A potent inhibitor of multiple pathways should be used with caution as that has the potential to elicit a clinically relevant drug-drug interaction i.e. medication that inhibits CYP3A4, CYP2C9, CYP2C19 PLZWC-58527s not an inducer of CYP1A2, CYP2B6, or CYP3A4 Showed moderate inhibition of P-gp and MATE1, substrates of P-gp and MATE1 may  have increased exposure when coadministered with  PLPOE-42353etabolized  by extrahepatic enzymes: CYP1A1, UGT1A7 Clinical relevance relating to drug interactions is unknown Stable when incubated with human recombinant MAO-A and MAO-B AYT-01601 is not an inhibitor or inducer of CYP isoforms and may be co-administered with substrates of CYP isoforms without risk of DDI.  Interactions with specific drugs: Potential for increased exposure of nintedanib Recommend separating time of administration of [id number]-23557 and nintedanib Drug interactions with pirfenidone are not expected Use of corticosteroids should be discussed beforehand with the medical monitor   Clinical drug interactions studies have not been performed at this time and therefore guidance is based on the in-vitro studies  Disallowed Medications/Foods/Supplements: Fluconazole Digoxin Rifampin Grapefruit Grapefruit-containing foods and beverages St. John's Wort   Pharmacokinetics: Tmax : 2-3 hours Half-life: approximately 48 hours Steady state reached in 5-7 days Minimal renal clearance (8.5% of drug is cleared by the kidneys)    Administration [phone] should be administered on an empty stomach May be administered via feeding tube   Safety:   Edition Addendum to Edition 5.0 - 30 September 2020   Animal studies: No effect on neurobehavior No effect on respiratory parameters No effects on blood pressure Showed an increase in QT and QTc The high dose 400 mg/kg showed an increase in mean heart rate   Cardiovascular effects in mice after administration of [id number]-23762: Dose-dependent increases in blood pressure Increase in PR and QRS duration Increase in arrhythmias  Reproductive animal toxicology studies at the low and medium doses, 150 and 300 mg/kg/day, revealed no impact on: maternal survival, fetal body weight, food parameters, uterine and ovarian parameters, no external malformations  When the high dose 600 mg/kg/day was tested in rabbits, the rabbits displayed  increased morbidities and increased abortions   Phase 1 studies:   Type of TEAE [id number] n=32 Placebo n=14 GI disorders 7 (22%) 0 Infections 3 (9%) 0 Musculoskeletal 1 (3%) 0 Nervous system disorders 2 (6%) 0   Most common treatment-emergent adverse event (TEAE):  headache  The most common drug-related adverse events were: nausea, diarrhea, abdominal distension. These occurred in 12.5% of participants  There were no deaths or serious adverse events in any of the Phase I studies There were no clinically meaningful changes in vital signs, telemetry or laboratory parameters [phone] did not display any clinically significant effect on ECG (iIncluding Qtc, HR, RR, PR and QRS)  Study [id number] All TEAEs were mild and deemed unrelated to study drug One TEAE, viral infection, led to discontinuation of IP No deaths nor SAEs  As of the 10 June 2020 cutoff date, preliminary data from ongoing and discontinued Phase 2a studies suggest no new safety concerns.  All SAE's reported to date were considered unrelated to study drug by the investigator. All treatment-emergent SAE's were known manifestations of the underlying  diseases (IPF, PSC or ARDS) and considered by the investigator to be most likely due to disease progresssion.  Part D of the study will evaluate approximately 28 participants at the 320 mg dose for at least 24 weeks to further characterize the safety profile of [id number]. Ongoing safety and FVC assessments will continue until the last participant reaches 24 weeks of the study; therefore, safety and FVC assessments may extend to 48 weeks in participants randomized earlier in the study.  Clinical Research Coordinator / Research RN note : This visit for [name] with DOB: 25-Oct-1945 on 02/09/2021 for the above protocol is Visit #1 and is for purpose of research.   The consent for this encounter is under Protocol Version  Amendment 3.0 , Investigator  Brochure Edition 5.0 Addendum, Consent Version 5.1 and is currently IRB approved.   Subject expressed continued interest and consent in continuing as a study subject. Subject confirmed that there was no change in contact information (e.g. address, telephone, email). Subject thanked for participation in research and contribution to science.  In this visit 02/09/2021 the subject will be evaluated by sub-investigator named Unice Cobble, MD . This research coordinator has verified that the sub-investigator is up to date with his training logs.  Because the PI is NOT available due to schedule issues, the sub-I reported and CRC has confirmed that the PI has discussed the visit a-priori with the sub-investigator.  The subject was given ample time to review the consent for the above study, and all questions were answered. The subject signed the informed consent and the sub-I was present for the consent process. No study related procedures or assessments were performed prior to the consent being signed. Refer to the Consent Process Documentation checklist for details of the consent process. All study visit 1 assessments and procedures were completed per the above named protocol. No AE's were reported. Refer to the subject's study binder for details of the visit. If all study inclusion criteria are met, the subject will return within the next 28 days for study visit 2.  Signed by Hale Drone, MS, [place] Coordinator  [place], [place] 4:27 PM 02/09/2021

## 2021-02-10 NOTE — Progress Notes (Signed)
RCL DOB Oct 05, 1945 was seen 09 February 2021 for randomization for Protocol # : [id number],NDLOPRAF Trials EUDRACT# 2019-002709--23. Cardiopulmonary symptoms are stable , but he had permanent pacemaker inserted in April for CHB with pulse in 30s. Positive findings on exam include ptosis & congenital legal blindness OD, keratosis R temple, minimal bibasilar rales & minimal accentuation of S2. None of these findings are clinically significant.                                                                                                                Darrick Penna Estalene Bergey MD ,[place]

## 2021-02-13 ENCOUNTER — Telehealth: Payer: Self-pay | Admitting: Internal Medicine

## 2021-02-13 NOTE — Telephone Encounter (Signed)
Raquel Sarna  Pls call  For radiology addendum:   - please call radiologist assistant line [phone] and specify STEVAN EBERWEIN , [birth year] and [phone] - mention imaging type HRCT and date 02/10/21 - request addendum for purpose of - make comment on progression compared to recent CT chest   Please send phone message back when done  Thanks    CT Chest High Resolution  Result Date: 02/10/2021 CLINICAL DATA:  75 year old male with history of idiopathic pulmonary fibrosis. EXAM: CT CHEST WITHOUT CONTRAST TECHNIQUE: Multidetector CT imaging of the chest was performed following the standard protocol without intravenous contrast. High resolution imaging of the lungs, as well as inspiratory and expiratory imaging, was performed. COMPARISON:  Chest CT 08/11/2020. FINDINGS: Cardiovascular: Heart size is normal. There is no significant pericardial fluid, thickening or pericardial calcification. There is aortic atherosclerosis, as well as atherosclerosis of the great vessels of the mediastinum and the coronary arteries, including calcified atherosclerotic plaque in the left main, left anterior descending, left circumflex and right coronary arteries. Calcifications of the aortic valve. Left-sided pacemaker/AICD with lead tips terminating in the right atrium and right ventricular apex. Mediastinum/Nodes: No pathologically enlarged mediastinal or hilar lymph nodes. Please note that accurate exclusion of hilar adenopathy is limited on noncontrast CT scans. Esophagus is unremarkable in appearance. No axillary lymphadenopathy. Lungs/Pleura: High-resolution images demonstrate widespread areas of mild peripheral predominant ground-glass attenuation, septal thickening, subpleural reticulation and traction bronchiectasis. Findings have a mild craniocaudal gradient. No definite honeycombing. Inspiratory and expiratory imaging is unremarkable. No acute consolidative airspace disease. No pleural effusions. No definite suspicious  appearing pulmonary nodules or masses are noted. Upper Abdomen: Aortic atherosclerosis. Musculoskeletal: There are no aggressive appearing lytic or blastic lesions noted in the visualized portions of the skeleton. IMPRESSION: 1. The appearance of the lungs is compatible with interstitial lung disease, with a spectrum of findings considered probable usual interstitial pneumonia (UIP) per current ATS guidelines. 2. Aortic atherosclerosis, in addition to left main and 3 vessel coronary artery disease. Assessment for potential risk factor modification, dietary therapy or pharmacologic therapy may be warranted, if clinically indicated. 3. There are calcifications of the aortic valve. Echocardiographic correlation for evaluation of potential valvular dysfunction may be warranted if clinically indicated. Aortic Atherosclerosis (ICD10-I70.0). Electronically Signed   By: Vinnie Langton M.D.   On: 02/10/2021 08:32      SIGNATURE    Dr. Brand Males, M.D., F.C.C.P,  Pulmonary and Critical Care Medicine Staff Physician, [place] Director - Interstitial Lung Disease  Program  [facility] [place] [facility] [place], [place], [address]  Pager: [phone], If no answer  OR between  19:00-7:00h: page [phone] Telephone (clinical office): [phone] Telephone (research): [phone]  10:20 AM 02/13/2021

## 2021-02-13 NOTE — Progress Notes (Signed)
Mr Fenlon - have written to radiologist seeing comment on progession compared to prior. Thanks

## 2021-02-15 NOTE — Telephone Encounter (Signed)
[facility] and spoke with Malachy Mood about the requested addendum that was needing to be done. Malachy Mood stated that she was going to send this to the radiologist.  Routing back to MR as an FYI to keep an eye out on this.

## 2021-02-21 ENCOUNTER — Encounter: Payer: Medicare Other | Admitting: Internal Medicine

## 2021-02-26 DIAGNOSIS — Z23 Encounter for immunization: Secondary | ICD-10-CM | POA: Diagnosis not present

## 2021-03-01 ENCOUNTER — Encounter (INDEPENDENT_AMBULATORY_CARE_PROVIDER_SITE_OTHER): Payer: Self-pay | Admitting: Internal Medicine

## 2021-03-01 ENCOUNTER — Other Ambulatory Visit: Payer: Self-pay

## 2021-03-01 ENCOUNTER — Telehealth: Payer: Self-pay | Admitting: Internal Medicine

## 2021-03-01 ENCOUNTER — Encounter: Payer: Self-pay | Admitting: *Deleted

## 2021-03-01 DIAGNOSIS — Z006 Encounter for examination for normal comparison and control in clinical research program: Secondary | ICD-10-CM

## 2021-03-01 DIAGNOSIS — J84112 Idiopathic pulmonary fibrosis: Secondary | ICD-10-CM

## 2021-03-01 DIAGNOSIS — R8290 Unspecified abnormal findings in urine: Secondary | ICD-10-CM

## 2021-03-01 DIAGNOSIS — R4189 Other symptoms and signs involving cognitive functions and awareness: Secondary | ICD-10-CM

## 2021-03-01 NOTE — Telephone Encounter (Signed)
I have called the pt and he is aware of MR recs.  Labs have been placed and he will come by in the morning to do these.  He did state that Ewing called him earlier and gave him these results.

## 2021-03-01 NOTE — Telephone Encounter (Signed)
Triage/Emily   [name] did a UA for research and is abnormal with leuks and protein. Few weeks ago it was normal. This is not bcause of study drug because he was not on any at that time  Plan   Pls have him come do a UA microscopy and culture 03/02/21 Any issues such as fever to go to ER

## 2021-03-01 NOTE — Research (Signed)
Title: A randomized, double-blind, multicenter, dose-ranging, placebo-controlled Phase 2a evaluation of the safety, tolerability and pharmacokinetics of [id number] in participants with idiopathic pulmonary fibrosis (IPF)   Clinical Research Coordinator / Research RN note : This visit for [name] with DOB: 11/21/1945 on 03/01/2021 for the above protocol is Visit #2   and is for purpose of research.   The consent for this encounter is under Protocol Version Amendment 3.0 , Investigator Brochure Edition v.5.0 Addendum. Consent Version 5.1 and is currently IRB approved.   Subject expressed continued interest and consent in continuing as a study subject. Subject confirmed that there was no change in contact information (e.g. address, telephone, email). Subject thanked for participation in research and contribution to science.  In this visit 03/01/2021 the subject will be evaluated by investigator named Dr. Ramaswamy . This research coordinator has verified that the investigator is up to date with his training logs.  Because this visit is a key visit of randomization, this visit is under direct supervision of the PI Dr. Ramaswamy . This PI is available for this visit.  All study visit 2 procedures and assessments were completed per the above mentioned protocol. No new AE's were reported. Subject met all criteria for inclusion in the study and was randomized into the study at this visit. He received the first dose of study drug during the visit. At 10 minutes post-dose, the subject complained of  feeling "fuzzy" but not light-headed or dizzy. Vital signs were obtained and were consistent with pre-dose vitals. No other symptoms or complaints. He noted that this feeling could have been the result of drinking "cold" water with the dose of study drug; he normally does not drink cold water. This feeling was resolved after approximately 10 minutes, and there were no further post-dose issues or complaints.  Refer to the subject's study binder for further details of the visit. He will return for study visit 3 in two weeks.   Signed by [name] Morris, MS, CCRC  Clinical Research Coordinator  [facility]  [place], [place] 1:31 PM 03/01/2021  

## 2021-03-02 ENCOUNTER — Other Ambulatory Visit (INDEPENDENT_AMBULATORY_CARE_PROVIDER_SITE_OTHER): Payer: Medicare Other

## 2021-03-02 DIAGNOSIS — R8290 Unspecified abnormal findings in urine: Secondary | ICD-10-CM | POA: Diagnosis not present

## 2021-03-02 LAB — URINALYSIS, MICROSCOPIC ONLY
RBC / HPF: NONE SEEN (ref 0–?)
WBC, UA: NONE SEEN (ref 0–?)

## 2021-03-02 NOTE — Progress Notes (Signed)
Title: A randomized, double-blind, multicenter, dose-ranging, placebo-controlled Phase 2a evaluation of the safety, tolerability and pharmacokinetics of [id number] in participants with idiopathic pulmonary fibrosis (IPF)   Patients will be randomized in a 3:1 ratio to one of two treatment arms:  320 mg [id number] or placebo.   Protocol #: [id number], Clinical Trials EUDRACT# [id number]  ** Sponsor [employer] [email or web address]   ([place] [address])    Key Inclusion Criteria:   Age >/= 75 years old Confident diagnosis of IPF  up to 5 years. If IPF diagnosis is within > 3 to = 5 years at screening, the participant must have evidence of progression within the last 24 months, as defined by decline in FVC percent predicted based on a relative decline of = 5% FVC% pred >/=45%  DLCO (hgb-adjusted) >/=30% Treatment with nintedanib or pirfenidone allowed, But must have stable dose =3 months before Screening Visit and remain unchanged during the study   Key Exclusion Criteria:   Receiving any non-approved agent intended for treatment of fibrosis in IPF FEV1 over the FVC ratio < 0.7 at screening Active infection,  that can affect FVC measurement during Screening or  Randomization Extent of emphysema > fibrotic changes on most recent HRCT IPF exacerbation within 6 months of Screening  Severe pulmonary hypertension Smoking of any kind w/in 3 months of Screening or unwilling to avoid smoking throughout the study Renal impairment or end-stage kidney disease requiring dialysis History of unstable /deteriorating cardiac/pulmonary disease (other than IPF) w/in 6 months of Screening Any ongoing known malignancy, except for localized cancers (i.e. basal cell carcinoma) Medical or surgical condition known to affect drug absorption Surgeries scheduled during the study period    Side-Effects: [id number] was generally well tolerated in over 280 healthy participants with no  clinical safety concerns or dose-limiting toxicities observed for single doses up to 685m and multiple doses up to 3291mQD administered for up to 14 days. The major side-effects reported have been headache (mild) and constipation (mild) in 3-76% of subjects. Other side effects, in 1-2% of subjects, were mild to moderate, with fever being the only severe side-effect reported.  Mechanism: Selective dual aV6 and aV1 integrin inhibitor with antifibrotic activity. PLXLK-44010revents aV6 and aV1 from binding to the RGD sequence of latency-associated peptide (LAP), this blocks the release of activated TGF-1, thereby blocking the activation of pro-fibrotic pathways. Additionally, PLUVO-53664as been shown to reduce collagen synthesis and gene expression in human IPF tissue.   Interactions: Acts as a substrate of P-gp, OATP1B1, OATP1B3, OAT3 Inhibitors or inducers of these enzymes may affect the intestinal absorption, hepatic uptake or renal uptake of PLQIH-47425etabolized by CYP3A4, CYP2C9, CYP2C19 Potent inhibitors or inducers of these enzymes should be avoided Each CYP enzyme metabolizes < 10% of the parent compound Since multiple CYP enzymes are involved, clinically relevant drug-drug interactions are less likely, as there are other alternative pathways A potent inhibitor of multiple pathways should be used with caution as that has the potential to elicit a clinically relevant drug-drug interaction i.e. medication that inhibits CYP3A4, CYP2C9, CYP2C19 PLZDG-38756s not an inducer of CYP1A2, CYP2B6, or CYP3A4 Showed moderate inhibition of P-gp and MATE1, substrates of P-gp and MATE1 may  have increased exposure when coadministered with PLEPP-29518etabolized by extrahepatic enzymes: CYP1A1, UGT1A7 Clinical relevance relating to drug interactions is unknown Stable when incubated with human recombinant MAO-A and MAO-B PLACZ-66063s not an inhibitor or inducer of CYP isoforms and may be co-administered  with substrates of CYP isoforms  without risk of DDI.  Interactions with specific drugs: Potential for increased exposure of nintedanib Recommend separating time of administration of XTK-24097 and nintedanib Drug interactions with pirfenidone are not expected Use of corticosteroids should be discussed beforehand with the medical monitor   Clinical drug interactions studies have not been performed at this time and therefore guidance is based on the in-vitro studies  Disallowed Medications/Foods/Supplements: Fluconazole Digoxin Rifampin Grapefruit Grapefruit-containing foods and beverages St. John's Wort   Pharmacokinetics: Tmax : 2-3 hours Half-life: approximately 48 hours Steady state reached in 5-7 days Minimal renal clearance (8.5% of drug is cleared by the kidneys)    Administration [phone] should be administered on an empty stomach May be administered via feeding tube   Safety:   Edition Addendum to Edition 5.0 - 30 September 2020   Animal studies: No effect on neurobehavior No effect on respiratory parameters No effects on blood pressure Showed an increase in QT and QTc The high dose 400 mg/kg showed an increase in mean heart rate   Cardiovascular effects in mice after administration of [id number]: Dose-dependent increases in blood pressure Increase in PR and QRS duration Increase in arrhythmias  Reproductive animal toxicology studies at the low and medium doses, 150 and 300 mg/kg/day, revealed no impact on: maternal survival, fetal body weight, food parameters, uterine and ovarian parameters, no external malformations  When the high dose 600 mg/kg/day was tested in rabbits, the rabbits displayed increased morbidities and increased abortions   Phase 1 studies:   Type of TEAE [id number] n=32 Placebo n=14 GI disorders 7 (22%) 0 Infections 3 (9%) 0 Musculoskeletal 1 (3%) 0 Nervous system disorders 2 (6%) 0   Most common treatment-emergent adverse  event (TEAE):  headache  The most common drug-related adverse events were: nausea, diarrhea, abdominal distension. These occurred in 12.5% of participants  There were no deaths or serious adverse events in any of the Phase I studies There were no clinically meaningful changes in vital signs, telemetry or laboratory parameters [phone] did not display any clinically significant effect on ECG (iIncluding Qtc, HR, RR, PR and QRS)  Study [id number] All TEAEs were mild and deemed unrelated to study drug One TEAE, viral infection, led to discontinuation of IP No deaths nor SAEs  As of the 10 June 2020 cutoff date, preliminary data from ongoing and discontinued Phase 2a studies suggest no new safety concerns.  All SAE's reported to date were considered unrelated to study drug by the investigator. All treatment-emergent SAE's were known manifestations of the underlying  diseases (IPF, PSC or ARDS) and considered by the investigator to be most likely due to disease progresssion.  Part D of the study will evaluate approximately 28 participants at the 320 mg dose for at least 24 weeks to further characterize the safety profile of [id number]-02637. Ongoing safety and FVC assessments will continue until the last participant reaches 24 weeks of the study; therefore, safety and FVC assessments may extend to 48 weeks in participants randomized earlier in the study.  Xxxxxxxxxxxxxxxxxxxxxxxxxxxxxxxxxxxxxxxxxxxxx   This visit for [name] with DOB: Nov 21, 1945 on 03/01/2021 for the above protocol is Visit/Encounter # randomization  and is for purpose of research . Subject/LAR expressed continued interest and consent in continuing as a study subject. Subject thanked for participation in research and contribution to science.   Cc: In this randomization visit I met with the patient.  He is doing well.  He is planning a trip to [place] after randomization.  He wanted next  a prescription to be on  the safe side for Paxlovid in case he got COVID.  We will write to the sponsor to get clarification on the safety rules for this COVID medication.  Currently no adverse events prior to randomization.    Objective - Physical exam done.  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  Post randomization he did have transient fogginess which he attributed to the cold water that we gave him.  He normally drinks room temperature water.  We will marked this as an adverse event.  There were no change in his vital signs     SIGNATURE    Dr. Brand Males, M.D., F.C.C.P, ACRP-CPI Pulmonary and Critical Care Medicine Research Investigator, [facility] [place] Staff Physician, [place] Director - Interstitial Lung Disease  Program  Pulmonary [facility] @ [place], [place], [address]   Pager: [phone], If no answer  OR between  19:00-7:00h: page [phone] Telephone (research): [phone]  9:06 AM 03/02/2021   9:06 AM 03/02/2021

## 2021-03-02 NOTE — Addendum Note (Signed)
Addended by: Jacob Moores on: 03/02/2021 08:23 AM   Modules accepted: Orders

## 2021-03-02 NOTE — Patient Instructions (Signed)
ICD-10-CM   1. Research study patient  Z00.6     2. IPF (idiopathic pulmonary fibrosis) ([place])  J84.112     3. Brain fog  R41.89       Per rsearch protocol

## 2021-03-05 ENCOUNTER — Telehealth: Payer: Self-pay | Admitting: Internal Medicine

## 2021-03-05 LAB — URINE CULTURE: Organism ID, Bacteria: NO GROWTH

## 2021-03-05 NOTE — Telephone Encounter (Signed)
[name]: Please let [name] know that UA and urine culture as [facility] 03/02/21 are normal.  This does not explain the abnormal research UA but is reassuring that soc UA was normal. Pending central lab reading, we can review the research UA but is increasingly looking research abnormal UA is Not clinically significant and not a AE  Pls let subject know and documnt here -> sign -> print > file  Thanks   MR   SIGNATURE    Dr. Brand Males, M.D., F.C.C.P, ACRP-CPI Pulmonary and [place], [facility] [place] Staff Physician, [place] Director - Interstitial Lung Disease  Program  Pulmonary [place] @ [place], [place], [address]   Pager: [phone], If no answer  OR between  19:00-7:00h: page [phone] Telephone (research): [phone]  10:57 AM 03/05/2021   10:57 AM 03/05/2021

## 2021-03-06 NOTE — Telephone Encounter (Signed)
Pls let Mr Reginald Davis know that on CT June 2022 -  When compared to prior study 08/11/2020, there is minimal progression of disease per radiollogy

## 2021-03-06 NOTE — Telephone Encounter (Signed)
I contacted the patient this morning and let him know that [facility] UA and culture were normal per Dr. Golden Pop instruction. Nothing further needed at this time. Hale Drone, MS, [place]

## 2021-03-07 ENCOUNTER — Telehealth: Payer: Self-pay | Admitting: Internal Medicine

## 2021-03-07 NOTE — Telephone Encounter (Signed)
Dr.Ramaswamy, I received an email from Mr. Ferencz this morning inquiring about getting a prescription for Paxlovid to take on his trip to [place] next week. We received instructions from the medical monitor on how this medication should be used while the patient is on the [facility] study, so this part has been taken care of and the subject is aware. Could you please order this prescription for him? He leaves on his trip on 07Jul2022. Thanks, Hale Drone, [place], [place]

## 2021-03-07 NOTE — Telephone Encounter (Signed)
Called and spoke with pt letting him know the info stated by radiology after having prior CT compared with recent CT and pt verbalized understanding. Nothing further needed.

## 2021-03-07 NOTE — Telephone Encounter (Signed)
Received the following message from patient:   "Good morning Dr. Chase Caller. [name] has informed me that she heard back from the medical monitor and I am ok to proceed with the Paxlovid script given their requested caveats. I believe that Leonia Reeves has shared this information with you. Would you please go ahead with a [facility]ax[facility]id script [facility] to [facility] [address]? Thank you. Clelia Croft"  MR, please advise. Thanks!

## 2021-03-08 NOTE — Telephone Encounter (Signed)
[name]: Please document in this epic note instructions around IP and paxlovid   Triage: Plse send paxlovid for future use for [employer] . This is for traverl   PAXLOVID   Paxlovid (nirmatelvir 300/Ritonavir100) - BID x 5 days - for GFR >= 60   PLEASE CHECK MED LIST for the following issues. Please check the 2 different condition related to concomitant medications in alphabetical order  If Reginald Davis with DOB July 06, 1946 is on any of the following Strong CYP3A inhibtors - this patient Reginald Davis should withold these concomitant meds so they can start paxlovid stratight away. If taking any of these:  alfuzosin, amiodarone, clozapine, colchicine, dihydroergotamine, dronedarone, ergotamine, flecainide, lovastatin, lurasidone, methylergonovine, midazolam [oral], pethidine, pimozide, propafenone, propoxyphene, quinidine, ranolazine, sildenafil simvastatin, triazolam).   If   Reginald Davis  with dob 04-22-1946 Is on any of these other strong CYP3A inducers then starting paxlovid should be delayed and the following meds should wash out first. These are dapalutamide, carbamazepine, phenobarbital, phenytoin, rifampin, St John's wort) - let me know immediately and we should delay starting paxlovid by some days even if he stops these medication.    PLEASE INFORM Reginald Davis  OF FOLLOWING SIDE EFFECTS  Side effects - all < 5%  - skin rash (and veyr rare a conditon called TEN) - angiomedia  - myalgia - jaundice - high bP (1%) - loss of taste  - diarrhea        Current Outpatient Medications:    albuterol (VENTOLIN HFA) 108 (90 Base) MCG/ACT inhaler, Inhale 1-2 puffs into the lungs every 6 (six) hours as needed for wheezing or shortness of breath., Disp: 18 g, Rfl: 1   Alirocumab (PRALUENT) 150 MG/ML SOAJ, Inject 150 mg as directed every 30 (thirty) days. , Disp: , Rfl:    allopurinol (ZYLOPRIM) 100 MG tablet, Take 1 tablet by mouth 2 (two) times daily., Disp: , Rfl:    Alpha-Lipoic  Acid 200 MG CAPS, Take 1 capsule by mouth in the morning., Disp: , Rfl:    aspirin 81 MG tablet, Take 81 mg by mouth daily., Disp: , Rfl:    atorvastatin (LIPITOR) 20 MG tablet, Take 10 mg by mouth at bedtime., Disp: , Rfl:    Coenzyme Q10 300 MG CAPS, Take 1 capsule by mouth every morning., Disp: , Rfl: 0   colchicine 0.6 MG tablet, Take 1 tablet by mouth 2 (two) times daily as needed (gout flare)., Disp: , Rfl:    dorzolamide-timolol (COSOPT) 22.3-6.8 MG/ML ophthalmic solution, Place 1 drop into the right eye 2 (two) times daily., Disp: , Rfl:    Guaifenesin 1200 MG TB12, Take 1,200 mg by mouth 2 (two) times daily., Disp: , Rfl:    Multiple Vitamins-Minerals (CENTRUM SILVER PO), Take 1 tablet by mouth every evening., Disp: , Rfl:    Omega-3 Fatty Acids (FISH OIL CONCENTRATE PO), Take 2,400 mg by mouth in the morning and at bedtime., Disp: , Rfl:    pantoprazole (PROTONIX) 40 MG tablet, Take 1 tablet (40 mg total) by mouth daily., Disp: 90 tablet, Rfl: 3   Pirfenidone (ESBRIET) 801 MG TABS, Take 1 tablet by mouth in the morning, at noon, and at bedtime., Disp: , Rfl:    Polyethyl Glycol-Propyl Glycol (SYSTANE) 0.4-0.3 % SOLN, Apply 1 drop to eye daily as needed (dry eyes)., Disp: , Rfl:    prednisoLONE acetate (PRED FORTE) 1 % ophthalmic suspension, Place 1 drop into the right eye every evening., Disp: ,  Rfl:    tiotropium (SPIRIVA HANDIHALER) 18 MCG inhalation capsule, Place 1 capsule (18 mcg total) into inhaler and inhale daily., Disp: 30 capsule, Rfl: 6   valACYclovir (VALTREX) 1000 MG tablet, Take 1 tablet by mouth daily as needed (fever blisters)., Disp: , Rfl:    valsartan (DIOVAN) 320 MG tablet, Take 1 tablet (320 mg total) by mouth daily. (Patient taking differently: Take 320 mg by mouth every evening.), Disp: 90 tablet, Rfl: 2   vitamin C (ASCORBIC ACID) 500 MG tablet, Take 500 mg by mouth every evening., Disp: , Rfl:

## 2021-03-08 NOTE — Telephone Encounter (Signed)
Reginald Davis  The paxlovid I aske for is the standard course for normal renal function. He has normal renal function.; Please tell phaarmacy and dispense a stnadard course of paxlovid  MR

## 2021-03-08 NOTE — Telephone Encounter (Signed)
See oteghr phone note on issue

## 2021-03-08 NOTE — Telephone Encounter (Signed)
MR.   The paxlovid does not come how you are wanting it to be sent into the pharmacy.  It comes with 3 tablets and the pt will have to have labs done prior to taking the medication.  Please advise. Thanks

## 2021-03-08 NOTE — Telephone Encounter (Signed)
Regarding use of Paxlovid while participating in the [facility] study, we received the following instruction from [place] [facility] [place]: "If Paxlovid is initiated for the treatment of COVID-19, study medication would need to be discontinued for the 5 days of treatment and not re-initiated until 7 days after the completion of the course of Paxlovid (total 12 days of discontinuation) due to irreversible inhibition of CYP3A4 by the ritonavir component of Paxlovid." This is to allow for restoration of capacity for metabolism of study medication.  Reginald Davis is aware of this and will be given explicit instructions at his next study visit on 05Jul2022 prior to his planned trip to [place]. Hale Drone, [employer]

## 2021-03-09 MED ORDER — PAXLOVID 20 X 150 MG & 10 X 100MG PO TBPK: 3.0000 | ORAL_TABLET | Freq: Two times a day (BID) | ORAL | 0 refills | Status: AC

## 2021-03-09 NOTE — Telephone Encounter (Signed)
Called and spoke with patient to verify his pharmacy. Will go ahead and send the Paxlovid to his pharmacy.   Nothing further needed at time of call.

## 2021-03-14 ENCOUNTER — Encounter: Payer: Self-pay | Admitting: *Deleted

## 2021-03-14 ENCOUNTER — Encounter (INDEPENDENT_AMBULATORY_CARE_PROVIDER_SITE_OTHER): Payer: Self-pay | Admitting: Adult Health

## 2021-03-14 ENCOUNTER — Other Ambulatory Visit: Payer: Self-pay

## 2021-03-14 DIAGNOSIS — J849 Interstitial pulmonary disease, unspecified: Secondary | ICD-10-CM

## 2021-03-14 DIAGNOSIS — Z006 Encounter for examination for normal comparison and control in clinical research program: Secondary | ICD-10-CM

## 2021-03-14 DIAGNOSIS — J84112 Idiopathic pulmonary fibrosis: Secondary | ICD-10-CM

## 2021-03-14 NOTE — Assessment & Plan Note (Signed)
Appears stable  Continue current regimen  
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
## 2021-03-14 NOTE — Research (Signed)
Title: A randomized, double-blind, multicenter, dose-ranging, placebo-controlled Phase 2a evaluation of the safety, tolerability and pharmacokinetics of [id number] in participants with idiopathic pulmonary fibrosis (IPF)   [facility] [place] Coordinator note :   This visit for [name] with DOB: 1945-11-07 on 03/14/2021 for the above protocol is Visit #3 and is for purpose of research.   The consent for this encounter is under Protocol Version Amendment 3 , Investigator Brochure Edition ver. 5.0 Addendum, Consent Version 5.1 and is currently IRB approved.   Subject expressed continued interest and consent in continuing as a study subject. Subject confirmed that there was no change in contact information (e.g. address, telephone, email). Subject thanked for participation in research and contribution to science.  In this visit 03/14/2021 the subject will be evaluated Sub-Investigator named Rexene Edison, NP. This research coordinator has verified that the above sub-investigator is up to date with her training logs.   The Subject was informed that the PI continues to have oversight of the subject's visits and course through relevant discussions, reviews and also specifically of this visit by routing of this note to the PI.  All study visit 3 assessments and procedures were completed per the above mentioned protocol. The subject stated that he has had no further incidents of brain fog since his first dose of study medication on 22Jun2022. However, he reports that beginning on 28Jun2022, he has had several mild headaches, which have apparently decreased in frequency and duration. He had another one on 29Jun2022, and the last one that he experienced was on 01Jul2022. He will let us know if he experiences any more headaches. No other new AE's reported. He has been 100% compliant with taking study medication. We went over instructions for use of the "just in case" medications that he  is taking on his trip to [place] later this week. He will return for study visit 4 in 2 weeks.   Signed by  Hale Drone, MS, [place] Coordinator  [place], [place] 11:01 AM 03/14/2021

## 2021-03-14 NOTE — Assessment & Plan Note (Signed)
Continue research protocol  AE Headache reported per protocol .  Paxlovid education sheet given to patient regarding usage and study drug .

## 2021-03-29 ENCOUNTER — Other Ambulatory Visit: Payer: Self-pay

## 2021-03-29 ENCOUNTER — Encounter: Payer: Medicare Other | Admitting: *Deleted

## 2021-03-29 DIAGNOSIS — Z006 Encounter for examination for normal comparison and control in clinical research program: Secondary | ICD-10-CM

## 2021-03-29 DIAGNOSIS — J84112 Idiopathic pulmonary fibrosis: Secondary | ICD-10-CM

## 2021-03-29 NOTE — Research (Signed)
Title: A randomized, double-blind, multicenter, dose-ranging, placebo-controlled Phase 2a evaluation of the safety, tolerability and pharmacokinetics of [id number] in participants with idiopathic pulmonary fibrosis (IPF)   [facility] [place] Coordinator note :   This visit for [name] with DOB: [birth year] on 03/29/2021 for the above protocol is Visit #4  and is for purpose of research.   The consent for this encounter is under Protocol Version Amendment 3.0 , Investigator Brochure Edition 5.0 Addendum, Consent Version 5.1 and is currently IRB approved.    Subject expressed continued interest and consent in continuing as a study subject. Subject confirmed that there was no change in contact information (e.g. address, telephone, email). Subject thanked for participation in research and contribution to science.  In this visit 03/29/2021 the subject will be evaluated by Sub-investigator named  Unice Cobble, MD . This research coordinator has verified that the above sub-investigator is up to date with his training logs.   The Subject was informed that the PI Dr. Chase Caller continues to have oversight of the subject's visits and course  through relevant discussions, reviews and also specifically of this visit by routing of this note to the [facility].  All study visit 4 procedures and assessments were completed per the above mentioned protocol. No new AE's were reported. With respect to the previously reported AE of Mild headaches, the subject states that these are still ongoing but are very mild and infrequent. He reports that on his recent trip to [place], he remained healthy and did not have to use any of his "just-in-case" medications that he took on the trip. He is 100% compliant with his dosing diary and with taking study medication; however, there was one tablet missing from the returned bottles of IP and he was unable to account for this missing tablet, stating that he was  "sure" that he dosed properly at each timepoint. Please refer to the subject's study binder for further details of the visit. He will return in 4 weeks for study visit 5.  Signed by  Hale Drone, MS, [place], [place] 1:58 PM 03/29/2021

## 2021-03-30 NOTE — Progress Notes (Signed)
Reginald Davis, date of birth [birth year], was interviewed and examined 29 March 2021 as subject #116 visit #4 in the [facility] trial. His respiratory status is stable and he has no new symptoms.  He recently visited his daughter in [place]. 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  He is barrel chested.  Minimal basilar rales are present.  Heart sounds are distant.                                                   Darrick Penna. Linna Darner, MD, SI

## 2021-04-04 ENCOUNTER — Other Ambulatory Visit: Payer: Self-pay

## 2021-04-04 MED ORDER — VALSARTAN 320 MG PO TABS: 320.0000 mg | ORAL_TABLET | Freq: Every evening | ORAL | 1 refills | Status: DC

## 2021-04-07 ENCOUNTER — Other Ambulatory Visit: Payer: Self-pay | Admitting: Pharmacist

## 2021-04-07 DIAGNOSIS — J84112 Idiopathic pulmonary fibrosis: Secondary | ICD-10-CM

## 2021-04-07 MED ORDER — PIRFENIDONE 801 MG PO TABS: 1.0000 | ORAL_TABLET | Freq: Three times a day (TID) | ORAL | 5 refills | Status: DC

## 2021-04-07 MED ORDER — PIRFENIDONE 801 MG PO TABS: 1.0000 | ORAL_TABLET | Freq: Three times a day (TID) | ORAL | 1 refills | Status: DC

## 2021-04-07 NOTE — Telephone Encounter (Signed)
Patient reached out to my office requesting refill for Esbriet.  Dose: '801mg'$  (1 tab) by mouth three times daily.  He is tolerating well without issue.  He has labs monitored regularly as he is research study participant.  Rx sent to [facility].  Knox Saliva, PharmD, MPH, BCPS Clinical Pharmacist (Rheumatology and Pulmonology)

## 2021-04-10 ENCOUNTER — Ambulatory Visit (INDEPENDENT_AMBULATORY_CARE_PROVIDER_SITE_OTHER): Payer: Medicare Other | Admitting: Cardiology

## 2021-04-10 ENCOUNTER — Other Ambulatory Visit: Payer: Self-pay

## 2021-04-10 ENCOUNTER — Encounter: Payer: Self-pay | Admitting: Cardiology

## 2021-04-10 VITALS — BP 140/80 | HR 66 | Ht 66.0 in | Wt 168.0 lb

## 2021-04-10 DIAGNOSIS — I2510 Atherosclerotic heart disease of native coronary artery without angina pectoris: Secondary | ICD-10-CM | POA: Diagnosis not present

## 2021-04-10 DIAGNOSIS — I442 Atrioventricular block, complete: Secondary | ICD-10-CM
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
## 2021-04-10 NOTE — Patient Instructions (Signed)
Medication Instructions:  Your physician recommends that you continue on your current medications as directed. Please refer to the Current Medication list given to you today.  *If you need a refill on your cardiac medications before your next appointment, please call your pharmacy*   Lab Work: None ordered If you have labs (blood work) drawn today and your tests are completely normal, you will receive your results only by: [place] (if you have MyChart) OR A paper copy in the mail If you have any lab test that is abnormal or we need to change your treatment, we will call you to review the results.   Testing/Procedures: None ordered   Follow-Up: At [facility], you and your health needs are our priority.  As part of our continuing mission to provide you with exceptional heart care, we have created designated Provider Care Teams.  These Care Teams include your primary Cardiologist (physician) and Advanced Practice Providers (APPs -  Physician Assistants and Nurse Practitioners) who all work together to provide you with the care you need, when you need it.  We recommend signing up for the patient portal called "MyChart".  Sign up information is provided on this After Visit Summary.  MyChart is used to connect with patients for Virtual Visits (Telemedicine).  Patients are able to view lab/test results, encounter notes, upcoming appointments, etc.  Non-urgent messages can be sent to your provider as well.   To learn more about what you can do with MyChart, go to [email or web address].    Remote monitoring is used to monitor your Pacemaker or ICD from home. This monitoring reduces the number of office visits required to check your device to one time per year. It allows [place] to keep an eye on the functioning of your device to ensure it is working properly. You are scheduled for a device check from home on 07/17/2021. You may send your transmission at any time that day. If you have a  wireless device, the transmission will be sent automatically. After your physician reviews your transmission, you will receive a postcard with your next transmission date.  Your next appointment:   9 month(s)  The format for your next appointment:   In Person  Provider:   Allegra Lai, MD   Thank you for choosing [facility]!!   Trinidad Curet, RN [phone]    Other Instructions

## 2021-04-17 ENCOUNTER — Ambulatory Visit (INDEPENDENT_AMBULATORY_CARE_PROVIDER_SITE_OTHER): Payer: Medicare Other

## 2021-04-17 DIAGNOSIS — I442 Atrioventricular block, complete: Secondary | ICD-10-CM

## 2021-04-18 LAB — CUP PACEART REMOTE DEVICE CHECK
Battery Remaining Longevity: 139 mo
Battery Remaining Percentage: 95.5 %
Battery Voltage: 3.04 V
Brady Statistic AP VP Percent: 1 %
Brady Statistic AP VS Percent: 1.8 %
Brady Statistic AS VP Percent: 1 %
Brady Statistic AS VS Percent: 98 %
Brady Statistic RA Percent Paced: 1.6 %
Brady Statistic RV Percent Paced: 1 %
Date Time Interrogation Session: 20220808020012
Implantable Lead Implant Date: 20220427
Implantable Lead Implant Date: 20220427
Implantable Lead Location: 753859
Implantable Lead Location: 753860
Implantable Pulse Generator Implant Date: 20220427
Lead Channel Impedance Value: 450 Ohm
Lead Channel Impedance Value: 480 Ohm
Lead Channel Pacing Threshold Amplitude: 0.75 V
Lead Channel Pacing Threshold Amplitude: 0.75 V
Lead Channel Pacing Threshold Pulse Width: 0.5 ms
Lead Channel Pacing Threshold Pulse Width: 0.5 ms
Lead Channel Sensing Intrinsic Amplitude: 12 mV
Lead Channel Sensing Intrinsic Amplitude: 4.6 mV
Lead Channel Setting Pacing Amplitude: 1 V
Lead Channel Setting Pacing Amplitude: 2 V
Lead Channel Setting Pacing Pulse Width: 0.5 ms
Lead Channel Setting Sensing Sensitivity: 2 mV
Pulse Gen Model: 2272
Pulse Gen Serial Number: 3920271

## 2021-04-24 ENCOUNTER — Other Ambulatory Visit: Payer: Self-pay | Admitting: Internal Medicine

## 2021-04-24 DIAGNOSIS — J84112 Idiopathic pulmonary fibrosis: Secondary | ICD-10-CM

## 2021-04-24 DIAGNOSIS — Z006 Encounter for examination for normal comparison and control in clinical research program: Secondary | ICD-10-CM

## 2021-04-27 ENCOUNTER — Encounter (INDEPENDENT_AMBULATORY_CARE_PROVIDER_SITE_OTHER): Payer: Self-pay | Admitting: Internal Medicine

## 2021-04-27 ENCOUNTER — Other Ambulatory Visit: Payer: Self-pay

## 2021-04-27 ENCOUNTER — Encounter: Payer: Self-pay | Admitting: *Deleted

## 2021-04-27 DIAGNOSIS — J84112 Idiopathic pulmonary fibrosis: Secondary | ICD-10-CM

## 2021-04-27 DIAGNOSIS — Z006 Encounter for examination for normal comparison and control in clinical research program: Secondary | ICD-10-CM

## 2021-04-27 NOTE — Research (Signed)
Title: A randomized, double-blind, multicenter, dose-ranging, placebo-controlled Phase 2a evaluation of the safety, tolerability and pharmacokinetics of [id number] in participants with idiopathic pulmonary fibrosis (IPF)   [facility] Coordinator note :   This visit for [name] with DOB: 1946/06/21 on 04/27/2021 for the above protocol is Visit #5  and is for purpose of research.   The consent for this encounter is under Protocol Version Amendment 3.0 , Investigator Brochure Edition v.5.0 Addendum, Consent Version 5.1 and is currently IRB approved.   Subject expressed continued interest and consent in continuing as a study subject. Subject confirmed that there was no change in contact information (e.g. address, telephone, email). Subject thanked for participation in research and contribution to science.  In this visit 04/27/2021 the subject will be evaluated by Principal Investigator named Dr. Chase Caller . This research coordinator has verified that the above investigator is up to date with his training logs.   The Subject was informed that the PI Dr. Chase Caller continues to have oversight of the subject's visits and course  through relevant discussions, reviews and also specifically of this visit by routing of this note to the [facility].  All study visit 5 procedures and assessments were completed per the above mentioned protocol. The subject noted several new issues that have started since his last visit: 1) sore throat, 2) variable sleep pattern, and 3) fatigue. These will all be reported as adverse events. He is 100% compliant with taking study medication and with completing the diary. Please refer to the subject's study binder for further details of the visit. He will return in 4 weeks for study visit 6.   Signed by Hale Drone, MS, [place] / Nurse [facility]  [place], [place] 2:16 PM 04/27/2021

## 2021-04-27 NOTE — Progress Notes (Addendum)
Title: A randomized, double-blind, multicenter, dose-ranging, placebo-controlled Phase 2a evaluation of the safety, tolerability and pharmacokinetics of [id number] in participants with idiopathic pulmonary fibrosis (IPF)   Patients will be randomized in a 3:1 ratio to one of two treatment arms:  320 mg [id number] or placebo.   Protocol #: [id number], Clinical Trials EUDRACT# [id number]  ** Sponsor [employer] [email or web address]   ([place] [address])       Side-Effects: [phone] was generally well tolerated in over 280 healthy participants with no clinical safety concerns or dose-limiting toxicities observed for single doses up to '640mg'$  and multiple doses up to '320mg'$  QD administered for up to 14 days. The major side-effects reported have been headache (mild) and constipation (mild) in 3-76% of subjects. Other side effects, in 1-2% of subjects, were mild to moderate, with fever being the only severe side-effect reported.  Mechanism: Selective dual aV6 and aV1 integrin inhibitor with antifibrotic activity. [id number] prevents aV6 and aV1 from binding to the RGD sequence of latency-associated peptide (LAP), this blocks the release of activated TGF-1, thereby blocking the activation of pro-fibrotic pathways. Additionally, [id number] has been shown to reduce collagen synthesis and gene expression in human IPF tissue.   Interactions: Acts as a substrate of P-gp, OATP1B1, OATP1B3, OAT3 Inhibitors or inducers of these enzymes may affect the intestinal absorption, hepatic uptake or renal uptake of [id number] Metabolized by CYP3A4, CYP2C9, CYP2C19 Potent inhibitors or inducers of these enzymes should be avoided Each CYP enzyme metabolizes < 10% of the parent compound Since multiple CYP enzymes are involved, clinically relevant drug-drug interactions are less likely, as there are other alternative pathways A potent inhibitor of multiple pathways should be used with caution as  that has the potential to elicit a clinically relevant drug-drug interaction i.e. medication that inhibits CYP3A4, CYP2C9, CYP2C19 [id number] is not an inducer of CYP1A2, CYP2B6, or CYP3A4 Showed moderate inhibition of P-gp and MATE1, substrates of P-gp and MATE1 may  have increased exposure when coadministered with [id number] Metabolized by extrahepatic enzymes: CYP1A1, UGT1A7 Clinical relevance relating to drug interactions is unknown Stable when incubated with human recombinant MAO-A and MAO-B [id number] is not an inhibitor or inducer of CYP isoforms and may be co-administered with substrates of CYP isoforms without risk of DDI.  Interactions with specific drugs: Potential for increased exposure of nintedanib Recommend separating time of administration of [id number] and nintedanib Drug interactions with pirfenidone are not expected Use of corticosteroids should be discussed beforehand with the medical monitor   Clinical drug interactions studies have not been performed at this time and therefore guidance is based on the in-vitro studies  Disallowed Medications/Foods/Supplements: Fluconazole Digoxin Rifampin Grapefruit Grapefruit-containing foods and beverages St. John's Wort   Pharmacokinetics: Tmax : 2-3 hours Half-life: approximately 48 hours Steady state reached in 5-7 days Minimal renal clearance (8.5% of drug is cleared by the kidneys)    Administration [phone] should be administered on an empty stomach May be administered via feeding tube   Safety:   Edition Addendum to Edition 5.0 - 30 September 2020   Animal studies: No effect on neurobehavior No effect on respiratory parameters No effects on blood pressure Showed an increase in QT and QTc The high dose 400 mg/kg showed an increase in mean heart rate   Cardiovascular effects in mice after administration of [id number]: Dose-dependent increases in blood pressure Increase in PR and QRS duration Increase in  arrhythmias  Reproductive animal toxicology studies at the low and medium doses, 150 and 300 mg/kg/day, revealed no  impact on: maternal survival, fetal body weight, food parameters, uterine and ovarian parameters, no external malformations  When the high dose 600 mg/kg/day was tested in rabbits, the rabbits displayed increased morbidities and increased abortions   Phase 1 studies:   Type of TEAE [id number] n=32 Placebo n=14 GI disorders 7 (22%) 0 Infections 3 (9%) 0 Musculoskeletal 1 (3%) 0 Nervous system disorders 2 (6%) 0   Most common treatment-emergent adverse event (TEAE):  headache  The most common drug-related adverse events were: nausea, diarrhea, abdominal distension. These occurred in 12.5% of participants  There were no deaths or serious adverse events in any of the Phase I studies There were no clinically meaningful changes in vital signs, telemetry or laboratory parameters [phone] did not display any clinically significant effect on ECG (iIncluding Qtc, HR, RR, PR and QRS)  Study [id number] All TEAEs were mild and deemed unrelated to study drug One TEAE, viral infection, led to discontinuation of IP No deaths nor SAEs  As of the 10 June 2020 cutoff date, preliminary data from ongoing and discontinued Phase 2a studies suggest no new safety concerns.  All SAE's reported to date were considered unrelated to study drug by the investigator. All treatment-emergent SAE's were known manifestations of the underlying  diseases (IPF, PSC or ARDS) and considered by the investigator to be most likely due to disease progresssion.  Part D of the study will evaluate approximately 28 participants at the 320 mg dose for at least 24 weeks to further characterize the safety profile of [id number]. Ongoing safety and FVC assessments will continue until the last participant reaches 24 weeks of the study; therefore, safety and FVC assessments may extend to 48 weeks in  participants randomized earlier in the study.   Xxxxxxxxxxxxxxxxxxxxxxxxxxxxxxxxxxxxxxxxxxxxxxxxxxxxxxx  This visit for [name] with DOB: Feb 02, 1946 on 04/27/2021 for the above protocol is Visit/Encounter #  scheduled vsiti and is for purpose of resarch . Subject/LAR expressed continued interest and consent in continuing as a study subject. Subject thanked for participation in research and contribution to science.   Subjective  = Here for a routine recent visit.  He presents with his wife.  He feels from a breathing perspective he is stable.  In fact he showed his overnight pulse oximetry that he does with his apple watch.  He says this is improved.  He uses 2 L oxygen continuous at night and uses continuous oxygen 3 L with exercise outside.  In the exertional oxygen is pulsed oxygen.  He states with this he does not desaturate.  However without oxygen when he walks he desaturates after walking 15 or 20 feet at a moderate pace.  But he says overall he stable.  His pulmonary function test show continued stability as documented below.  He exercises at least 30 minutes a day 5-6 times a week.  He has upcoming [facility] lung transplant reevaluation monitoring visit on 05/01/2021.  His new issues are - Around the end of July he started having some sore throat.  Raspy voice friction when swallowing.  He has tried saline spray and gargling with warm salt water.  There are some relief with this with the issue persist.  He does not think he has any dysphagia.  He denies anything stuck in his throat.  But there is a pervasive variable slight sense of irritation.  He uses Spiriva.  He is diligently masking.  He Mast more than the average person.  We deemed this as an adverse event for this  mild.  Probably unrelated to study drug.  Recommended Biotene mouthwash and toothpaste.  -Also having some variable sleep pattern.  He says this is recently.  Assuming this is since July 2022.  Some nights he sleeps  longer and more soundly.  Some of the nights erratic broken sleep.  We feel this could be because of pirfenidone and possibly because of study drug.  Is mild to moderate in intensity.  -Also for the last few weeks his fatigue level is increased.  Generally later in the day by midafternoon.  This coincides with increased hoarseness.  We took a shared decision making this could be because of pirfenidone but also possibly from the study drug.  But no change   Objective  Physical exam done.  Baseline.  Documented in the source chart.  Results for KEYTON, WINIARSKI (MRN [id number]) as of 04/27/2021 12:33  Ref. Range 07/12/2020 11:53 [place] 11/25/2020 08:52 SOC 02/09/21 resear 03/01/21 researc 03/29/21 research 04/27/21 research  FVC-Pre Latest Units: L 3.36 3.43 3.68 3.84 3.808 3.883  FVC-%Pred-Pre Latest Units: % 88 94 104.1 108.3 107.6 109.7  Results for TRYSON, MAGAW (MRN [id number]) as of 04/27/2021 12:33  Ref. Range 07/12/2020 11:53 11/25/2020 08:52 02/09/21 researc  DLCO cor Latest Units: ml/min/mmHg 16.88 13.45 13.8  DLCO cor % pred Latest Units: % 73 60 58%    Assessment/Plan  IPF -stable.  Continue pirfenidone and study drug  Sore throat: AE.  Unrelated to study drug/protocol.  To Biotene.  Probably related to Spiriva and masking  Variables sleep pattern is an adverse event.  Probably related to study drug but also more likely due to pirfenidone.  No change in current plan  Fatigue - is an adverse event.  Probably related to study drug but more likely due to pirfenidone.  No change in current plan     SIGNATURE    Dr. Brand Males, M.D., F.C.C.P, ACRP-CPI Pulmonary and Critical Care Medicine Research Investigator, [facility] [place] Staff Physician, [place] Director - Interstitial Lung Disease  Program  P[facility] [place], [place], [address]   Pager: [phone], If no answer  OR  between  19:00-7:00h: page [phone] Telephone (research): [phone]  12:59 PM 04/27/2021   12:59 PM 04/27/2021
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
## 2021-05-08 ENCOUNTER — Ambulatory Visit (INDEPENDENT_AMBULATORY_CARE_PROVIDER_SITE_OTHER): Payer: Medicare Other | Admitting: Cardiovascular Disease

## 2021-05-08 ENCOUNTER — Other Ambulatory Visit: Payer: Self-pay

## 2021-05-08 ENCOUNTER — Encounter (HOSPITAL_BASED_OUTPATIENT_CLINIC_OR_DEPARTMENT_OTHER): Payer: Self-pay | Admitting: Cardiovascular Disease

## 2021-05-08 DIAGNOSIS — E7800 Pure hypercholesterolemia, unspecified: Secondary | ICD-10-CM

## 2021-05-08 DIAGNOSIS — I442 Atrioventricular block, complete: Secondary | ICD-10-CM | POA: Diagnosis not present

## 2021-05-08 DIAGNOSIS — I2510 Atherosclerotic heart disease of native coronary artery without angina pectoris: Secondary | ICD-10-CM

## 2021-05-08 DIAGNOSIS — I10 Essential (primary) hypertension: Secondary | ICD-10-CM

## 2021-05-08 DIAGNOSIS — J849 Interstitial pulmonary disease, unspecified: Secondary | ICD-10-CM

## 2021-05-08 NOTE — Assessment & Plan Note (Signed)
Followed at [facility] Pre-lung transplant cetner.  Also on a study drug here in [place].

## 2021-05-08 NOTE — Assessment & Plan Note (Signed)
Doing well with his dual-chamber pacemaker.  He is followed by Dr. Curt Bears.  It was implanted for complete heart block but he rarely paces.

## 2021-05-08 NOTE — Assessment & Plan Note (Addendum)
BP slightly above goal in the office but has been mostly <130/80 at hom.e  Well-controlled on valsartan. He has been limiting socium intake.  Averages 8732 steps daily.  Working on weigh loss.  His home blood pressure log of twice daily readings over the last month was reviewed and roughly averages in the 120s over 70s.

## 2021-05-08 NOTE — Assessment & Plan Note (Signed)
Non-obstructive disease.  He has no angina.  Continue working on regular exercise.  Lipids are well-controlled on Praluent.  Continue aspirin, atorvastatin, and Praluent.

## 2021-05-08 NOTE — Patient Instructions (Signed)
Medication Instructions:  Your physician recommends that you continue on your current medications as directed. Please refer to the Current Medication list given to you today.  *If you need a refill on your cardiac medications before your next appointment, please call your pharmacy*   Lab Work: NONE If you have labs (blood work) drawn today and your tests are completely normal, you will receive your results only by: [place] (if you have MyChart) OR A paper copy in the mail If you have any lab test that is abnormal or we need to change your treatment, we will call you to review the results.   Testing/Procedures: NONE   Follow-Up: At [facility], you and your health needs are our priority.  As part of our continuing mission to provide you with exceptional heart care, we have created designated Provider Care Teams.  These Care Teams include your primary Cardiologist (physician) and Advanced Practice Providers (APPs -  Physician Assistants and Nurse Practitioners) who all work together to provide you with the care you need, when you need it.  We recommend signing up for the patient portal called "MyChart".  Sign up information is provided on this After Visit Summary.  MyChart is used to connect with patients for Virtual Visits (Telemedicine).  Patients are able to view lab/test results, encounter notes, upcoming appointments, etc.  Non-urgent messages can be sent to your provider as well.   To learn more about what you can do with MyChart, go to [email or web address].    Your next appointment:   6 month(s)  The format for your next appointment:   In Person  Provider:   Laurann Montana, NP  Your physician wants you to follow-up in: [place] will receive a reminder letter in the mail two months in advance. If you don't receive a letter, please call our office to schedule the follow-up appointment.

## 2021-05-08 NOTE — Assessment & Plan Note (Signed)
Lipids are very well-controlled on Praluent and atorvastatin.

## 2021-05-08 NOTE — Assessment & Plan Note (Signed)
Non-obstructive CAD on cardiac CT-A in 2019.  No angina and lipids are well-controlled on Praluent and atorvastatin.  Continue aspirin, atorvastatin and Praluent.

## 2021-05-09 ENCOUNTER — Encounter (HOSPITAL_BASED_OUTPATIENT_CLINIC_OR_DEPARTMENT_OTHER): Payer: Self-pay

## 2021-05-10 ENCOUNTER — Other Ambulatory Visit: Payer: Medicare Other

## 2021-05-10 ENCOUNTER — Telehealth: Payer: Self-pay | Admitting: Internal Medicine

## 2021-05-10 DIAGNOSIS — R300 Dysuria: Secondary | ICD-10-CM | POA: Diagnosis not present

## 2021-05-10 LAB — URINALYSIS, ROUTINE W REFLEX MICROSCOPIC
Bilirubin Urine: NEGATIVE
Hgb urine dipstick: NEGATIVE
Ketones, ur: NEGATIVE
Leukocytes,Ua: NEGATIVE
Nitrite: NEGATIVE
RBC / HPF: NONE SEEN (ref 0–?)
Specific Gravity, Urine: <=1.005 — AB (ref 1.000–1.030)
Total Protein, Urine: NEGATIVE
Urine Glucose: NEGATIVE
Urobilinogen, UA: 0.2 (ref 0.0–1.0)
pH: 6 (ref 5.0–8.0)

## 2021-05-10 NOTE — Telephone Encounter (Signed)
Spoke with Reginald Davis. She stated that he will need to go to the [place] lab for these tests.   Called and spoke with patient. He is aware that he will need to go to the [place] lab. Orders have been placed.   Nothing further needed at time of call.

## 2021-05-10 NOTE — Telephone Encounter (Signed)
I received an email today, followed by a phone call, from Reginald Davis. He states that yesterday he experienced mild UTI symptoms of a burning sensation on voiding. Today the symptoms are slightly increased. No fever or other symptoms. He states that he feels that something is "not right". He requests that Dr. Chase Caller order a [place] urinalysis and culture so that he can find out asap if there is a problem. He understands that he would have to seek treatment, if necessary, from another provider, and that we would have to follow-up with the [place] study medical monitor before any new medication was taken since he is a participant in the [id number] clinical trial. We will follow up as needed. Hale Drone, MS, Warehouse manager, [place] clinical trial

## 2021-05-10 NOTE — Telephone Encounter (Signed)
Called and spoke with patient. He is aware that I am waiting to hear back from [place] to see if he needs to go to the [place] lab for the UA or if he can come here.   Will place the orders and call him back I hear back.

## 2021-05-10 NOTE — Telephone Encounter (Signed)
Please send standard of care UA and Urine culture today 05/10/2021   s- senind to triage. Indication: dysuria

## 2021-05-11 LAB — URINE CULTURE
MICRO NUMBER:: 12316015
Result:: NO GROWTH
SPECIMEN QUALITY:: ADEQUATE

## 2021-05-11 NOTE — Progress Notes (Signed)
Remote pacemaker transmission.   

## 2021-05-23 ENCOUNTER — Other Ambulatory Visit: Payer: Self-pay

## 2021-05-23 ENCOUNTER — Ambulatory Visit (INDEPENDENT_AMBULATORY_CARE_PROVIDER_SITE_OTHER)
Admission: RE | Admit: 2021-05-23 | Discharge: 2021-05-23 | Disposition: A | Discharge status: Home / Self Care | Payer: Self-pay | Source: Ambulatory Visit | Attending: Internal Medicine | Admitting: Internal Medicine

## 2021-05-23 DIAGNOSIS — I700 Atherosclerosis of aorta: Secondary | ICD-10-CM | POA: Diagnosis not present

## 2021-05-23 DIAGNOSIS — Z006 Encounter for examination for normal comparison and control in clinical research program: Secondary | ICD-10-CM

## 2021-05-23 DIAGNOSIS — J84112 Idiopathic pulmonary fibrosis: Secondary | ICD-10-CM

## 2021-05-23 DIAGNOSIS — J439 Emphysema, unspecified: Secondary | ICD-10-CM | POA: Diagnosis not present

## 2021-05-24 ENCOUNTER — Encounter: Payer: Self-pay | Admitting: *Deleted

## 2021-05-24 ENCOUNTER — Encounter (INDEPENDENT_AMBULATORY_CARE_PROVIDER_SITE_OTHER): Payer: Self-pay | Admitting: Adult Health

## 2021-05-24 DIAGNOSIS — I10 Essential (primary) hypertension: Secondary | ICD-10-CM

## 2021-05-24 DIAGNOSIS — J84112 Idiopathic pulmonary fibrosis: Secondary | ICD-10-CM

## 2021-05-24 DIAGNOSIS — Z006 Encounter for examination for normal comparison and control in clinical research program: Secondary | ICD-10-CM

## 2021-05-24 DIAGNOSIS — R21 Rash and other nonspecific skin eruption: Secondary | ICD-10-CM

## 2021-05-24 DIAGNOSIS — J849 Interstitial pulmonary disease, unspecified: Secondary | ICD-10-CM

## 2021-05-24 NOTE — Assessment & Plan Note (Signed)
Rash along forearms ? Etiology  follow up with derm

## 2021-05-24 NOTE — Assessment & Plan Note (Signed)
Continue study protocol  Elevated blood pressure and rash reported for AE .

## 2021-05-24 NOTE — Assessment & Plan Note (Signed)
B/p elevated today , advised to follow up with PCP and Cards
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
## 2021-05-24 NOTE — Research (Signed)
Title: A randomized, double-blind, multicenter, dose-ranging, placebo-controlled Phase 2a evaluation of the safety, tolerability and pharmacokinetics of [id number] in participants with idiopathic pulmonary fibrosis (IPF)   Patients will be randomized in a 3:1 ratio to one of two treatment arms:  320 mg [id number] or placebo.   [facility] [place] Coordinator note :   This visit for [name] with DOB: 08-13-1946 on 05/24/2021 for the above protocol is Visit #  and is for purpose of research.   The consent for this encounter is under Protocol Version Amendment 3.0 , Investigator Brochure Edition v.5.0 Addendum, Consent Version 5.1 and is currently IRB approved.   Subject expressed continued interest and consent in continuing as a study subject. Subject confirmed that there was no change in contact information (e.g. address, telephone, email). Subject thanked for participation in research and contribution to science.  In this visit 05/24/2021 the subject will be evaluated by Sub-Investigator named Rexene Edison, NP . This research coordinator has verified that the above investigator is up to date with her training logs.   The Subject was informed that the PI Dr. Chase Caller continues to have oversight of the subject's visits and course  through relevant discussions, reviews and also specifically of this visit by routing of this note to the [place].  All study visit 6 procedures and assessments were completed per the above mentioned protocol. New AE's of worsening hypertension and rash on forearms were reported. See subject's study binder for further details of these AE's as well as AE's that were reported to be resolved. The subject is 100% compliant with taking study medication and completing the diary. He will return in 2 weeks for study visit 7.  Signed by Hale Drone, MS, [place] Coordinator [place], [place] 3:40 PM 05/24/2021

## 2021-05-24 NOTE — Assessment & Plan Note (Signed)
Appears stable 

## 2021-05-25 DIAGNOSIS — L432 Lichenoid drug reaction: Secondary | ICD-10-CM | POA: Diagnosis not present

## 2021-06-01 DIAGNOSIS — M109 Gout, unspecified: Secondary | ICD-10-CM | POA: Diagnosis not present

## 2021-06-01 DIAGNOSIS — N401 Enlarged prostate with lower urinary tract symptoms: Secondary | ICD-10-CM | POA: Diagnosis not present

## 2021-06-01 DIAGNOSIS — R972 Elevated prostate specific antigen [PSA]: Secondary | ICD-10-CM | POA: Diagnosis not present

## 2021-06-01 DIAGNOSIS — E785 Hyperlipidemia, unspecified: Secondary | ICD-10-CM | POA: Diagnosis not present

## 2021-06-01 DIAGNOSIS — R0600 Dyspnea, unspecified: Secondary | ICD-10-CM | POA: Diagnosis not present

## 2021-06-01 DIAGNOSIS — Z23 Encounter for immunization: Secondary | ICD-10-CM | POA: Diagnosis not present

## 2021-06-01 DIAGNOSIS — J479 Bronchiectasis, uncomplicated: Secondary | ICD-10-CM | POA: Diagnosis not present

## 2021-06-01 DIAGNOSIS — K219 Gastro-esophageal reflux disease without esophagitis: Secondary | ICD-10-CM | POA: Diagnosis not present

## 2021-06-01 DIAGNOSIS — J8410 Pulmonary fibrosis, unspecified: Secondary | ICD-10-CM | POA: Diagnosis not present

## 2021-06-01 DIAGNOSIS — R7301 Impaired fasting glucose: Secondary | ICD-10-CM | POA: Diagnosis not present

## 2021-06-01 DIAGNOSIS — I2583 Coronary atherosclerosis due to lipid rich plaque: Secondary | ICD-10-CM | POA: Diagnosis not present

## 2021-06-07 ENCOUNTER — Encounter: Payer: Medicare Other | Admitting: Adult Health

## 2021-06-08 ENCOUNTER — Encounter: Payer: Medicare Other | Admitting: *Deleted

## 2021-06-08 ENCOUNTER — Encounter (INDEPENDENT_AMBULATORY_CARE_PROVIDER_SITE_OTHER): Payer: Medicare Other | Admitting: Internal Medicine

## 2021-06-08 ENCOUNTER — Other Ambulatory Visit: Payer: Self-pay

## 2021-06-08 ENCOUNTER — Telehealth: Payer: Self-pay | Admitting: Internal Medicine

## 2021-06-08 DIAGNOSIS — J84112 Idiopathic pulmonary fibrosis: Secondary | ICD-10-CM

## 2021-06-08 DIAGNOSIS — Z006 Encounter for examination for normal comparison and control in clinical research program: Secondary | ICD-10-CM

## 2021-06-08 NOTE — Telephone Encounter (Cosign Needed)
Mr. Reginald Davis was seen today 29Sep2022 for visit 7 on the [employer] study. The chart below shows BP readings from each visit since he has been enrolled in the study:  Visit Date             BP  1 02Jun2022 140/78  Screening 2 22Jun2022 140/80  Randomization               142/84  2 hr post-dose 3 05Jul2022 138/82  4 20Jul20222 148/84                150/82  2 hr post-dose 5 18Aug2022 140/82  6 14Sep2022 164/88                180/108 2 hr post-dose 7 29Sep2022 168/96   At visit 6 on 14Sep2022, an AE of Worsening hypertension was recorded. This AE was ongoing at visit 7 today.  Please advise if anything further is needed.  Hale Drone, MS, Warehouse manager

## 2021-06-08 NOTE — Research (Signed)
Title: A randomized, double-blind, multicenter, dose-ranging, placebo-controlled Phase 2a evaluation of the safety, tolerability and pharmacokinetics of [id number] in participants with idiopathic pulmonary fibrosis (IPF)   [facility] [place] Coordinator note :   This visit for Subject DELSHAWN STECH with DOB: [birth year] on 06/08/2021 for the above protocol is Visit # 7  and is for purpose of research.   The consent for this encounter is under Protocol Version Amendment 3.0 , Investigator Brochure Edition v 5.0 Addendum, Consent Version 5.1 and is currently IRB approved.   Subject expressed continued interest and consent in continuing as a study subject. Subject confirmed that there was no change in contact information (e.g. address, telephone, email). Subject thanked for participation in research and contribution to science.  In this visit 06/08/2021 the subject will be evaluated by Principal Investigator named Dr.Ramaswamy . This research coordinator has verified that the above investigator is up to date with his training logs.   The Subject was informed that the PI Dr. Chase Caller continues to have oversight of the subject's visits and course  through relevant discussions, reviews and also specifically of this visit by routing of this note to the [place].  All study visit 7 procedures and assessments were completed per the above mentioned protocol. No new AE's were reported. AE's of rash on forearms, variable sleep pattern, and worsening hypertension are all ongoing. The subject is 100% compliant with taking study medication and completing the diary. Please refer to the subject's study binder for further details of the visit. He will return in 6 weeks for study visit 8.   Signed by Hale Drone, MS, [place] Coordinator [place], [place] 11:23 AM 06/08/2021

## 2021-06-09 NOTE — Telephone Encounter (Signed)
Hi Reginald Davis  Non urgent issue. This patient is on a study called [employer] for IPF. Details below. Noticd hypertension at time he started study but randomization BP readings are higher. Maybe from study drug. Do not know . We had 1 othet patient go through same on study . Wanted to bring this to your attention. For now, he is continunig the study drug but when you evaluate him and feel otherwise please LMK   Title: A randomized, double-blind, multicenter, dose-ranging, placebo-controlled Phase 2a evaluation of the safety, tolerability and pharmacokinetics of [id number] in participants with idiopathic pulmonary fibrosis (IPF)   Patients will be randomized in a 3:1 ratio to one of two treatment arms:  320 mg [id number] or placebo.   Protocol #: [id number], Clinical Trials EUDRACT# [id number]  ** Sponsor [employer] [email or web address]   ([place] [address])  Side-Effects: [phone] was generally well tolerated in over 280 healthy participants with no clinical safety concerns or dose-limiting toxicities observed for single doses up to 640mg  and multiple doses up to 320mg  QD administered for up to 14 days. The major side-effects reported have been headache (mild) and constipation (mild) in 3-76% of subjects. Other side effects, in 1-2% of subjects, were mild to moderate, with fever being the only severe side-effect reported.  Mechanism: Selective dual aV6 and aV1 integrin inhibitor with antifibrotic activity. [id number] prevents aV6 and aV1 from binding to the RGD sequence of latency-associated peptide (LAP), this blocks the release of activated TGF-1, thereby blocking the activation of pro-fibrotic pathways. Additionally, ZHG-99242 has been shown to reduce collagen synthesis and gene expression in human IPF tissue.   Interactions: Acts as a substrate of P-gp, OATP1B1, OATP1B3, OAT3 Inhibitors or inducers of these enzymes may affect the intestinal absorption, hepatic uptake or  renal uptake of AST-41962 Metabolized by CYP3A4, CYP2C9, CYP2C19 Potent inhibitors or inducers of these enzymes should be avoided Each CYP enzyme metabolizes < 10% of the parent compound Since multiple CYP enzymes are involved, clinically relevant drug-drug interactions are less likely, as there are other alternative pathways A potent inhibitor of multiple pathways should be used with caution as that has the potential to elicit a clinically relevant drug-drug interaction i.e. medication that inhibits CYP3A4, CYP2C9, CYP2C19 [id number] is not an inducer of CYP1A2, CYP2B6, or CYP3A4 Showed moderate inhibition of P-gp and MATE1, substrates of P-gp and MATE1 may  have increased exposure when coadministered with [id number] Metabolized by extrahepatic enzymes: CYP1A1, UGT1A7 Clinical relevance relating to drug interactions is unknown Stable when incubated with human recombinant MAO-A and MAO-B [id number]-[id number] is not an inhibitor or inducer of CYP isoforms and may be co-administered with substrates of CYP isoforms without risk of DDI.  Interactions with specific drugs: Potential for increased exposure of nintedanib Recommend separating time of administration of [id number] and nintedanib Drug interactions with pirfenidone are not expected Use of corticosteroids should be discussed beforehand with the medical monitor   Clinical drug interactions studies have not been performed at this time and therefore guidance is based on the in-vitro studies  Disallowed Medications/Foods/Supplements: Fluconazole Digoxin Rifampin Grapefruit Grapefruit-containing foods and beverages St. John's Wort   Pharmacokinetics: Tmax : 2-3 hours Half-life: approximately 48 hours Steady state reached in 5-7 days Minimal renal clearance (8.5% of drug is cleared by the kidneys)    Administration [id number] should be administered on an empty stomach May be administered via feeding tube   Safety:   Edition Addendum to  Edition 5.0 - 30 September 2020   Animal studies: No effect  on neurobehavior No effect on respiratory parameters No effects on blood pressure Showed an increase in QT and QTc The high dose 400 mg/kg showed an increase in mean heart rate   Cardiovascular effects in mice after administration of [id number]-38882: Dose-dependent increases in blood pressure Increase in PR and QRS duration Increase in arrhythmias  Reproductive animal toxicology studies at the low and medium doses, 150 and 300 mg/kg/day, revealed no impact on: maternal survival, fetal body weight, food parameters, uterine and ovarian parameters, no external malformations  When the high dose 600 mg/kg/day was tested in rabbits, the rabbits displayed increased morbidities and increased abortions   Phase 1 studies:   Type of TEAE [id number] n=32 Placebo n=14 GI disorders 7 (22%) 0 Infections 3 (9%) 0 Musculoskeletal 1 (3%) 0 Nervous system disorders 2 (6%) 0   Most common treatment-emergent adverse event (TEAE):  headache  The most common drug-related adverse events were: nausea, diarrhea, abdominal distension. These occurred in 12.5% of participants  There were no deaths or serious adverse events in any of the Phase I studies There were no clinically meaningful changes in vital signs, telemetry or laboratory parameters [phone] did not display any clinically significant effect on ECG (iIncluding Qtc, HR, RR, PR and QRS)  Study [id number] All TEAEs were mild and deemed unrelated to study drug One TEAE, viral infection, led to discontinuation of IP No deaths nor SAEs  As of the 10 June 2020 cutoff date, preliminary data from ongoing and discontinued Phase 2a studies suggest no new safety concerns.  All SAE's reported to date were considered unrelated to study drug by the investigator. All treatment-emergent SAE's were known manifestations of the underlying  diseases (IPF, PSC or ARDS) and considered by the  investigator to be most likely due to disease progresssion.  Part D of the study will evaluate approximately 28 participants at the 320 mg dose for at least 24 weeks to further characterize the safety profile of [id number]. Ongoing safety and FVC assessments will continue until the last participant reaches 24 weeks of the study; therefore, safety and FVC assessments may extend to 48 weeks in participants randomized earlier in the study.        Thanks    SIGNATURE    Dr. Brand Males, M.D., F.C.C.P,  Pulmonary and Critical Care Medicine Staff Physician, [facility] Director - Interstitial Lung Disease  Program  Pulmonary [place] at [place], [place], [address]  NPI Number:  NPI #[id number]  Pager: [phone], If no answer  -> Check AMION or Try [phone] Telephone (clinical office): [phone] Telephone (research): [phone]  8:56 AM 06/09/2021

## 2021-06-12 ENCOUNTER — Telehealth: Payer: Self-pay | Admitting: Cardiovascular Disease

## 2021-06-12 NOTE — Telephone Encounter (Signed)
This encounter was created in error - please disregard.

## 2021-06-12 NOTE — Telephone Encounter (Signed)
Spoke with patient and he monitor blood pressure, log and bring to appointment 10/18

## 2021-06-12 NOTE — Addendum Note (Signed)
Addended by: Alvina Filbert B on: 06/12/2021 03:35 PM   Modules accepted: Orders, Level of Service, SmartSet

## 2021-06-12 NOTE — Telephone Encounter (Signed)
OK will do.    Reginald Davis, can we get him a sooner follow up appointment to discuss his BP.  Please ask him to track at home and bring to follow up.    Tiffany   Left message to call back  Moved appointment to 10/18 with Dr Oval Linsey

## 2021-06-12 NOTE — Telephone Encounter (Signed)
Patient returning Melinda's call

## 2021-06-14 ENCOUNTER — Other Ambulatory Visit (HOSPITAL_BASED_OUTPATIENT_CLINIC_OR_DEPARTMENT_OTHER): Payer: Self-pay

## 2021-06-14 ENCOUNTER — Ambulatory Visit: Payer: Medicare Other | Attending: Internal Medicine

## 2021-06-14 DIAGNOSIS — Z23 Encounter for immunization: Secondary | ICD-10-CM

## 2021-06-14 MED ORDER — MODERNA COVID-19 BIVAL BOOSTER 50 MCG/0.5ML IM SUSP
INTRAMUSCULAR | 0 refills | Status: DC
  Filled 2021-06-14: qty 0.5, 1d supply, fill #0

## 2021-06-14 NOTE — Progress Notes (Signed)
   Covid-19 Vaccination Clinic  Name:  ERNESTO ZUKOWSKI    MRN: [id number] DOB: 1945-10-18  06/14/2021  Mr. Glascoe was observed post Covid-19 immunization for 15 minutes without incident. He was provided with Vaccine Information Sheet and instruction to access the V-Safe system.   Mr. Senna was instructed to call 911 with any severe reactions post vaccine: Difficulty breathing  Swelling of face and throat  A fast heartbeat  A bad rash all over body  Dizziness and weakness

## 2021-06-27 ENCOUNTER — Ambulatory Visit (INDEPENDENT_AMBULATORY_CARE_PROVIDER_SITE_OTHER): Payer: Medicare Other | Admitting: Cardiovascular Disease

## 2021-06-27 ENCOUNTER — Other Ambulatory Visit: Payer: Self-pay

## 2021-06-27 ENCOUNTER — Encounter (HOSPITAL_BASED_OUTPATIENT_CLINIC_OR_DEPARTMENT_OTHER): Payer: Self-pay | Admitting: Cardiovascular Disease

## 2021-06-27 DIAGNOSIS — I2510 Atherosclerotic heart disease of native coronary artery without angina pectoris: Secondary | ICD-10-CM

## 2021-06-27 DIAGNOSIS — I10 Essential (primary) hypertension: Secondary | ICD-10-CM | POA: Diagnosis not present

## 2021-06-27 DIAGNOSIS — E7800 Pure hypercholesterolemia, unspecified: Secondary | ICD-10-CM

## 2021-06-27 NOTE — Assessment & Plan Note (Addendum)
BP has ben a little more labile.  There is concern for whether his study drug could be contributing.  He has white coat hypertension superimposed on essential hypertension.  His home average in the last 30 days is 132/74.  His BP machine in the office is accurate.  For now we will keep monitoring it and continue his valsartan.  He understands that given that he already has a healthy diet and exercises regularly, if his blood pressure continues to creep up we will need to add an additional agent.

## 2021-06-27 NOTE — Assessment & Plan Note (Signed)
Lipids are well-controlled on Praluent and atorvastatin.
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
## 2021-06-27 NOTE — Patient Instructions (Addendum)
Medication Instructions:  Your physician recommends that you continue on your current medications as directed. Please refer to the Current Medication list given to you today.   *If you need a refill on your cardiac medications before your next appointment, please call your pharmacy*  Lab Work: NONE   Testing/Procedures: NONE   Follow-Up: At [facility], you and your health needs are our priority.  As part of our continuing mission to provide you with exceptional heart care, we have created designated Provider Care Teams.  These Care Teams include your primary Cardiologist (physician) and Advanced Practice Providers (APPs -  Physician Assistants and Nurse Practitioners) who all work together to provide you with the care you need, when you need it.  We recommend signing up for the patient portal called "MyChart".  Sign up information is provided on this After Visit Summary.  MyChart is used to connect with patients for Virtual Visits (Telemedicine).  Patients are able to view lab/test results, encounter notes, upcoming appointments, etc.  Non-urgent messages can be sent to your provider as well.   To learn more about what you can do with MyChart, go to [email or web address].    Your next appointment:   12 month(s)  The format for your next appointment:   In Person  Provider:   Skeet Latch, MD  [place] 11/07/2021  Other Instructions  MONITOR AND LOG YOUR BLOOD PRESSURE AT HOME  BRING YOUR READINGS AND MACHINE TO FOLLOW UP WITH [facility]

## 2021-06-27 NOTE — Assessment & Plan Note (Signed)
Non-obstructive CAD.  He is exercising regularly and has no angina.  Lipids are well-controlled. Continue aspirin, Praluent, atorvastatin.

## 2021-07-17 ENCOUNTER — Ambulatory Visit (INDEPENDENT_AMBULATORY_CARE_PROVIDER_SITE_OTHER): Payer: Medicare Other

## 2021-07-17 DIAGNOSIS — I442 Atrioventricular block, complete: Secondary | ICD-10-CM | POA: Diagnosis not present

## 2021-07-17 LAB — CUP PACEART REMOTE DEVICE CHECK
Battery Remaining Longevity: 135 mo
Battery Remaining Percentage: 95.5 %
Battery Voltage: 3.02 V
Brady Statistic AP VP Percent: 1 %
Brady Statistic AP VS Percent: 1.3 %
Brady Statistic AS VP Percent: 1 %
Brady Statistic AS VS Percent: 99 %
Brady Statistic RA Percent Paced: 1.2 %
Brady Statistic RV Percent Paced: 1 %
Date Time Interrogation Session: 20221107010012
Implantable Lead Implant Date: 20220427
Implantable Lead Implant Date: 20220427
Implantable Lead Location: 753859
Implantable Lead Location: 753860
Implantable Pulse Generator Implant Date: 20220427
Lead Channel Impedance Value: 450 Ohm
Lead Channel Impedance Value: 460 Ohm
Lead Channel Pacing Threshold Amplitude: 0.75 V
Lead Channel Pacing Threshold Amplitude: 0.75 V
Lead Channel Pacing Threshold Pulse Width: 0.5 ms
Lead Channel Pacing Threshold Pulse Width: 0.5 ms
Lead Channel Sensing Intrinsic Amplitude: 12 mV
Lead Channel Sensing Intrinsic Amplitude: 4.2 mV
Lead Channel Setting Pacing Amplitude: 1 V
Lead Channel Setting Pacing Amplitude: 2 V
Lead Channel Setting Pacing Pulse Width: 0.5 ms
Lead Channel Setting Sensing Sensitivity: 2 mV
Pulse Gen Model: 2272
Pulse Gen Serial Number: 3920271

## 2021-07-18 ENCOUNTER — Encounter: Payer: Medicare Other | Admitting: Adult Health

## 2021-07-18 ENCOUNTER — Other Ambulatory Visit: Payer: Self-pay | Admitting: Internal Medicine

## 2021-07-18 DIAGNOSIS — J84112 Idiopathic pulmonary fibrosis: Secondary | ICD-10-CM

## 2021-07-18 DIAGNOSIS — Z006 Encounter for examination for normal comparison and control in clinical research program: Secondary | ICD-10-CM

## 2021-07-19 ENCOUNTER — Encounter (INDEPENDENT_AMBULATORY_CARE_PROVIDER_SITE_OTHER): Payer: Medicare Other | Admitting: Primary Care

## 2021-07-19 ENCOUNTER — Other Ambulatory Visit: Payer: Self-pay

## 2021-07-19 ENCOUNTER — Encounter: Payer: Medicare Other | Admitting: *Deleted

## 2021-07-19 ENCOUNTER — Encounter: Payer: Medicare Other | Admitting: Internal Medicine

## 2021-07-19 ENCOUNTER — Encounter: Payer: Medicare Other | Admitting: Adult Health

## 2021-07-19 DIAGNOSIS — Z006 Encounter for examination for normal comparison and control in clinical research program: Secondary | ICD-10-CM

## 2021-07-19 DIAGNOSIS — R21 Rash and other nonspecific skin eruption: Secondary | ICD-10-CM

## 2021-07-19 DIAGNOSIS — I10 Essential (primary) hypertension: Secondary | ICD-10-CM

## 2021-07-19 DIAGNOSIS — J849 Interstitial pulmonary disease, unspecified: Secondary | ICD-10-CM

## 2021-07-19 DIAGNOSIS — J84112 Idiopathic pulmonary fibrosis: Secondary | ICD-10-CM

## 2021-07-19 NOTE — Research (Signed)
Title: A randomized, double-blind, multicenter, dose-ranging, placebo-controlled Phase 2a evaluation of the safety, tolerability and pharmacokinetics of [id number] in participants with idiopathic pulmonary fibrosis (IPF)   Patients will be randomized in a 3:1 ratio to one of two treatment arms:  320 mg [id number] or placebo.   [facility] [place] Coordinator note :   This visit for [name] with DOB: 1946-03-07 on 07/19/2021 for the above protocol is Visit # 8  and is for purpose of research.   The consent for this encounter is under Protocol Version Amendment 3.0 , Investigator Brochure Edition v.5.0 Addendum, Consent Version 5.1 and is currently IRB approved.   Subject expressed continued interest and consent in continuing as a study subject. Subject confirmed that there was no change in contact information (e.g. address, telephone, email). Subject thanked for participation in research and contribution to science.  In this visit 07/19/2021 the subject will be evaluated by Sub-Investigator named Derl Barrow, NP. This research coordinator has verified that the above investigator is up to date with her training logs.   The Subject was informed that the PI Dr. Chase Caller continues to have oversight of the subject's visits and course  through relevant discussions, reviews and also specifically of this visit by routing of this note to the [place].  All study visit 8 procedures and assessments were completed per the above mentioned protocol. No new AE's were reported. AE's of rash on forearms, worsening hypertension and variable sleep pattern were still ongoing, but all have improved. The subject is 100% compliant with taking study medication and with completing the diary. See the subject's study binder for further details of the visit. He will return in 4 weeks for study visit 9 and to have a HRCT done.   Signed by Hale Drone, MS, [place] Coordinator  [place], [place] 1:17 PM 07/19/2021
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
## 2021-07-19 NOTE — Assessment & Plan Note (Signed)
-   Elevated today (readings at home have been better) - Advised to follow-up with PCP/cardiology

## 2021-07-19 NOTE — Assessment & Plan Note (Signed)
-   Rash to forearms and back has improved since starting triamcinolone cream  - Following with dermatology

## 2021-07-19 NOTE — Assessment & Plan Note (Signed)
-   Appears stable, following with Dr. Chase Caller  - Continue Esbriet and [employer] research study per protocol for ILD

## 2021-07-24 ENCOUNTER — Telehealth: Payer: Self-pay | Admitting: *Deleted

## 2021-07-24 NOTE — Telephone Encounter (Signed)
Pt is reaching out to check the status of his forms... please advise

## 2021-07-25 NOTE — Telephone Encounter (Signed)
Aware MD completed/signed requested paperwork. Advised at front desk for him to pick up at his convenience. Patient verbalized understanding and agreeable to plan.

## 2021-07-25 NOTE — Progress Notes (Signed)
Remote pacemaker transmission.   

## 2021-08-15 ENCOUNTER — Encounter (INDEPENDENT_AMBULATORY_CARE_PROVIDER_SITE_OTHER): Payer: Medicare Other | Admitting: Adult Health

## 2021-08-15 ENCOUNTER — Other Ambulatory Visit: Payer: Self-pay

## 2021-08-15 ENCOUNTER — Encounter: Payer: Self-pay | Admitting: Adult Health

## 2021-08-15 ENCOUNTER — Ambulatory Visit (INDEPENDENT_AMBULATORY_CARE_PROVIDER_SITE_OTHER)
Admission: RE | Admit: 2021-08-15 | Discharge: 2021-08-15 | Disposition: A | Discharge status: Home / Self Care | Payer: Self-pay | Source: Ambulatory Visit | Attending: Internal Medicine | Admitting: Internal Medicine

## 2021-08-15 ENCOUNTER — Encounter: Payer: Medicare Other | Admitting: *Deleted

## 2021-08-15 DIAGNOSIS — J849 Interstitial pulmonary disease, unspecified: Secondary | ICD-10-CM

## 2021-08-15 DIAGNOSIS — J479 Bronchiectasis, uncomplicated: Secondary | ICD-10-CM | POA: Diagnosis not present

## 2021-08-15 DIAGNOSIS — J84112 Idiopathic pulmonary fibrosis: Secondary | ICD-10-CM

## 2021-08-15 DIAGNOSIS — Z006 Encounter for examination for normal comparison and control in clinical research program: Secondary | ICD-10-CM

## 2021-08-15 DIAGNOSIS — J432 Centrilobular emphysema: Secondary | ICD-10-CM | POA: Diagnosis not present

## 2021-08-15 DIAGNOSIS — I2510 Atherosclerotic heart disease of native coronary artery without angina pectoris: Secondary | ICD-10-CM | POA: Diagnosis not present

## 2021-08-15 NOTE — Research (Addendum)
Title: A randomized, double-blind, multicenter, dose-ranging, placebo-controlled Phase 2a evaluation of the safety, tolerability and pharmacokinetics of [id number] in participants with idiopathic pulmonary fibrosis (IPF)   Patients will be randomized in a 3:1 ratio to one of two treatment arms:  320 mg [id number] or placebo.   [facility] Coordinator note :   This visit for [name] with DOB: October 07, 1945 on 08/15/2021 for the above protocol is Visit #9  and is for purpose of research.   The consent for this encounter is under Protocol Version Amendment 3.0 , Investigator Brochure Edition v.5.0 Addendum,  Consent Version 5.1 and is currently IRB approved.    Subject expressed continued interest and consent in continuing as a study subject. Subject confirmed that there was no change in contact information (e.g. address, telephone, email). Subject thanked for participation in research and contribution to science. In this visit 08/15/2021 the subject will be evaluated by Sub-Investigator named Rexene Edison, NP. This research coordinator has verified that the above investigator is up to date with her training logs.   The Subject was informed that the PI Dr. Chase Caller continues to have oversight of the subject's visits and course  through relevant discussions, reviews and also specifically of this visit by routing of this note to the [place].  All study visit 9 procedures and assessments were completed per the above mentioned protocol. No new AE's were reported. See the subject's study binder for details about ongoing AE's. He is 100% compliant with taking study medication and with completing the diary. Refer to the subject's study binder for further details of the visit. He will return in 2 weeks for study visit 10.  Signed by Hale Drone, MS, [place] Coordinator  [facility]  [place], [place] 1:40 PM 08/15/2021

## 2021-08-15 NOTE — Assessment & Plan Note (Signed)
Stable continue on current regimen  Dietary modifications discussed for esbriet  Needs office follow up with Dr. Chase Caller
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
## 2021-08-15 NOTE — Assessment & Plan Note (Signed)
Continue on study protocol  AE's reported per protocol

## 2021-08-17 ENCOUNTER — Encounter: Payer: Medicare Other | Admitting: Adult Health

## 2021-08-22 ENCOUNTER — Ambulatory Visit (INDEPENDENT_AMBULATORY_CARE_PROVIDER_SITE_OTHER): Payer: Medicare Other | Admitting: Internal Medicine

## 2021-08-22 ENCOUNTER — Encounter: Payer: Self-pay | Admitting: Internal Medicine

## 2021-08-22 ENCOUNTER — Other Ambulatory Visit: Payer: Self-pay

## 2021-08-22 ENCOUNTER — Other Ambulatory Visit (INDEPENDENT_AMBULATORY_CARE_PROVIDER_SITE_OTHER): Payer: Medicare Other

## 2021-08-22 VITALS — BP 134/74 | HR 60 | Temp 97.5°F | Ht 66.0 in | Wt 173.6 lb

## 2021-08-22 DIAGNOSIS — R8290 Unspecified abnormal findings in urine: Secondary | ICD-10-CM

## 2021-08-22 DIAGNOSIS — R300 Dysuria: Secondary | ICD-10-CM

## 2021-08-22 DIAGNOSIS — K521 Toxic gastroenteritis and colitis: Secondary | ICD-10-CM

## 2021-08-22 DIAGNOSIS — J84112 Idiopathic pulmonary fibrosis: Secondary | ICD-10-CM

## 2021-08-22 DIAGNOSIS — Z006 Encounter for examination for normal comparison and control in clinical research program: Secondary | ICD-10-CM

## 2021-08-22 DIAGNOSIS — I2510 Atherosclerotic heart disease of native coronary artery without angina pectoris: Secondary | ICD-10-CM | POA: Diagnosis not present

## 2021-08-22 LAB — URINALYSIS
Bilirubin Urine: NEGATIVE
Hgb urine dipstick: NEGATIVE
Ketones, ur: NEGATIVE
Leukocytes,Ua: NEGATIVE
Nitrite: NEGATIVE
Specific Gravity, Urine: <=1.005 — AB (ref 1.000–1.030)
Total Protein, Urine: NEGATIVE
Urine Glucose: NEGATIVE
Urobilinogen, UA: 0.2 (ref 0.0–1.0)
pH: 6 (ref 5.0–8.0)

## 2021-08-22 NOTE — Patient Instructions (Addendum)
ICD-10-CM   1. IPF (idiopathic pulmonary fibrosis) ([place])  J84.112     2. Research study patient  Z00.6     3. Diarrhea due to drug  K52.1      IPF (idiopathic pulmonary fibrosis) ([place]) Research study patient  - clinically stable  - safety labs in research are okay per [name]  Diarrhea due to drug   Likely due to esbriet +/- study drug based on timing but other drugs could be involved  - will mark as AE , possibly study drug related PPI can canuse diarrhea 5% diarrhea with diovan > 20% diarrhea with colchicine  Plan - check amylapse, lipase, cbc, bmet, LFT  - check c diff, GI Pathogen stool PCR ,  - check stool Ova and Parasites  - check UA and urine culture - continue immodium - for now continue diovan and colchcine but consider change PPI to H2 blockade OTC - avoid artificial sweetners - will check with sponsor about starting lomotil - monitor o  Skin rash  - mild perisstent, diffuse,. No change  Followup  - video visit 1-2 weeks with APP to review test results

## 2021-08-22 NOTE — Progress Notes (Addendum)
OV 08/09/2020  Subjective:  Patient ID: Reginald Davis, male , DOB: November 05, 1945 , age 75 y.o. , MRN: [id number] , ADDRESS: [place] [address] PCP [name], MD Patient Care Team: [name], MD as PCP - General (Internal Medicine) End, Harrell Gave, MD as PCP - Cardiology (Cardiology) Cardiologist is Dr. Ottie Glazier This Provider for this visit: Treatment Team:  Attending Provider: Brand Males, MD    08/09/2020 -   Chief Complaint  Patient presents with   Consult    COPD, DOE     HPI Reginald Davis 75 y.o. -  retired academic an Therapist, sports for a [employer]  History is given by the patient who also brought in very clearly typed notes about his history.  He says in the 1970s and 1980s while he was busy with his professional life he was a smoker and finally quit smoking in 1997.  His total pack smoking history might be 70 years.  He undergoes regular screening CT scans of the chest.  Most recently March 2021 that reported signs of emphysema and a small upper lobe nodule unchanged over many years and possible interstitial lung disease changes.  He says he was at baseline and doing well but in April 2021 he started noticing shortness of breath.  He was placed on Spiriva which improved his symptoms daily.  After that he felt like significantly improved.  He then was dealing with significant difficult to control blood pressures with Dr. Meda Coffee.  She has increased his dosage of the blood pressure medication.  He was remaining active walking approximately thousand steps daily and 30 minutes of walking 5 more days per week.  Then in September 2020 when he and his wife traveled to [place] and then drove to [place] which is an elevation of 4000-4500 feet.  After that they continue to [facility] feet.  They got very short of breath.  Even had a fever episode.  His pulse oximetry dropped to 84-85.  He ended up in the emergency room  and was given a diagnosis of COPD exacerbation.  Given doxycycline and prednisone and then he improved.  Post improvement in September and post return to [facility] he has had difficult to control blood pressure but he does have some residual shortness of breath with exertion relieved by rest.  His apple watch has noticed desaturations at night that are transient with pulse ox ranging 85 to 96%.  His resting heart rate at bed is between 54 and 90 at night.  His current walking desaturation test is below.  He dropped only 2 points but he did get tachycardic.  His pulmonary function test show slight reduction in DLCO but otherwise okay  He follows with Dr. Meda Coffee who noticed he has left lower lobe crackles.  Due to persistent symptoms he has been referred to pulmonary clinic.  He wants to get to the bottom of the issues.  His echocardiogram shows grade 1 diastolic dysfunction.  He does suffer from some visceral obesity.   Of note he has upcoming air travel to [place] on August 15, 2020.  He is going to see his granddaughter who is not seen since the onset of the pandemic.  He believes he may not be traveling to high altitudes in [place].   Simple office walk 185 feet x  3 laps goal with forehead probe 08/09/2020   O2 used ra  Number laps completed 3  Comments about pace normal  Resting Pulse Ox/HR 96% and 80/min  Final Pulse Ox/HR 94% and 90/min  Desaturated </= 88% no  Desaturated <= 3% points no  Got Tachycardic >/= 90/min yes  Symptoms at end of test dyspnea  Miscellaneous comments x         PFT Results Latest Ref Rng & Units 07/12/2020  FVC-Pre L 3.36  FVC-Predicted Pre % 88  FVC-Post L 3.41  FVC-Predicted Post % 90  Pre FEV1/FVC % % 72  Post FEV1/FCV % % 74  FEV1-Pre L 2.41  FEV1-Predicted Pre % 88  FEV1-Post L 2.51  DLCO uncorrected ml/min/mmHg 16.88  DLCO UNC% % 73  DLCO corrected ml/min/mmHg 16.88  DLCO COR %Predicted % 73  DLVA Predicted % 80  TLC L 5.39  TLC %  Predicted % 83  RV % Predicted % 65    ADDENDUM: There is a right lower lobe nodule that is seen between vessels in the right lower lobe on image 97 of series 8. This nodule was compared to studies dating back to 2015 and shows no change. It is possible that this represents a lymph node along bronchovascular structures but again this is stable over a 6 year interval, obscured by surrounding vessels and bronchovascular structures.     Electronically Signed   By: Zetta Bills M.D.   On: 11/27/2019 14:45    Signed by Felipa Emory, MD on 11/27/2019  2:48 PM  Narrative & Impression  CLINICAL DATA:  Left upper lobe nodules, history of bronchiectasis and emphysema.   EXAM: CT CHEST WITHOUT CONTRAST   TECHNIQUE: Multidetector CT imaging of the chest was performed following the standard protocol without IV contrast.   COMPARISON:  Coronary CT from 08/26/2018, CT chest from 09/20/2017   FINDINGS: Cardiovascular: Calcified atherosclerotic changes in the thoracic aorta without signs of aneurysm. Limited assessment on noncontrast evaluation. The heart size is normal without pericardial effusion. Coronary artery disease with three-vessel calcification as before.   Mediastinum/Nodes: Thoracic inlet structures are normal. No axillary lymphadenopathy. Small right juxta hilar lymph node, 9 mm unchanged since January of 2019 along with other scattered lymph nodes below CT criteria for pathologic enlargement. Limited assessment of hilar structures due to lack of contrast. Esophagus is mildly patulous with some gas in the lumen.   Lungs/Pleura: Small nodule between bronchovascular structures in the central portion of the right lower lobe (image 97, series 8) 11 x 7 mm, within 1 mm of its size in 2019. No dense consolidation. Mild basilar bronchiectasis. Subpleural reticulation as before with signs of centrilobular emphysema.   Tiny nodules in the left upper lobe are stable.    Upper Abdomen: Incidentally imaged portions of upper abdominal contents are unremarkable. No evidence of acute upper abdominal process.   Musculoskeletal: No chest wall mass. Sclerosis of the right clavicular head is unchanged since 2019. Spinal degenerative changes as before.   IMPRESSION: 1. Small nodule between bronchovascular structures in the central portion of the right lower lobe measuring 11 x 7 mm, within 1 mm of its size in 2019. This nodule is within 1 mm of its size in 2019. This nodule is unchanged since 2015 and this therefore benign. 2. Signs of emphysema and chronic interstitial changes with tiny upper lobe nodules are unchanged. 3. Coronary artery disease with three-vessel calcification.   Aortic Atherosclerosis (ICD10-I70.0) and Emphysema (ICD10-J43.9).   Electronically Signed: By: Zetta Bills M.D. On: 11/26/2019 09:11     08/29/2020- Interim hx Patient  presents today to review testing and discuss starting anti-fiibrotic medication. CT imaging suggestive of probable UIP with progression since 2015, high suspicion towards IPF. He had negative serology. Dr. Chase Caller recommend starting anti-fibrotic, either Esbriet or OFEV. Accompanied by his wife during today's appointment. He is baseline today, shortness of breath and cough are the same as last visit. He was recently in [place] for 10 days, he did not need to take on hand doxycyline or prednisone. He had an overnight oximetry test on12/2/21 which showed <88% 5 hrs 35 mins, start 2L [place]. He has not received oxygen yet, he was contacted by DME company. He is interested in [employer] continuous oxygen concentrator and is willing to pay out of pocket in needed. He reports daytime oxygen saturation runs between 87-92% RA. He is active, gets an average of 8,000 steps a day. He will be changing medicare part D plans to [employer] silver come January 1st 2022.     OV 09/29/2020  Subjective:  Patient ID: Reginald Davis, male ,  DOB: [birth year] , age 73 y.o. , MRN: [id number] , ADDRESS: [place] [address] PCP [name], MD Patient Care Team: [name], MD as PCP - General (Internal Medicine) End, Harrell Gave, MD as PCP - Cardiology (Cardiology)  This Provider for this visit: Treatment Team:  Attending Provider: Brand Males, MD    09/29/2020 -   Chief Complaint  Patient presents with   Follow-up    6 min walk performed 09/22/20.  Pt states he feels like he has been stable since last visit. Pt is wearing 2L at night.  Pt states if he is on an incline, his sats are dropping some below 88% on room air.    IPF dx givien 08/26/20 based on "probable UIP on CT + negative srology, +  age > 33, _ white + male + prior smoker + heart burn/GERD hx-> this is "IPF"  Associated mild emphysema present -started Spiriva  GERD hx  Pirfenidone start this pending January 2022/February 2022  HPI Reginald Davis 75 y.o. -returns for follow-up.  He presents with his wife.  Since his last visit in the interim he saw nurse practitioner.  We gave him a diagnosis of IPF based on probable UIP on CT scan age greater than 66, Caucasian ethnicity, male gender, prior smoker, acid reflux history and negative serology.  He has accepted the diagnosis.  He presents with his wife at this point in time.  They have gone through pirfenidone education and started paperwork.  He is waiting for co-pay approval.  At this point in time he feels stable his symptom score is below.  He is completed a 6-minute walk test but he did not desaturate below 88%.  The focus of this visit is multiple questions in general education and answering questions about idiopathic pulmonary fibrosis  -Overall his cough is improved.  He attributes his cough improvement to omeprazole.  However he wants to switch to pantoprazole.  He did some reading and he said that he is concerned about theoretical effects of omeprazole and pirfenidone plasma levels  therefore he wants to switch to pantoprazole.  I have supported this and will do the prescription.  -He is very interested in clinical trials as a care option.  We discussed concept of clinical trials being voluntary and what care option meant.  He is probably leaning towards later phase trials.  We gave him consent form for PROMEDIOR Phase 3 and Pliant phase 2 studies  but did indicate to him that there is currently a backlog and in addition he will have to be stable on antifibrotic's for a few months before he can enroll.  He is understanding of this.  -He is interested in the patient support group and I have given him the email for a local support group to the pulmonary fibrosis foundation  -He exercises regularly.  However he is interested in joining pulmonary rehabilitation a referral has been made but he is still waiting to hear from them.  I have emailed the director to get a follow-up.  -We discussed the natural history of pulmonary fibrosis.  Especially IPF.  We discussed its progressive disease.  He and his wife wanted to know about prognostic markers.  Currently these are not commercially available.  In particular is interested in [place].  Indicated that in the next few years, she will test might be available to differentiate prognosis but at this time we would follow clinically.  Currently based on current severity and stability predicts future progression.  Also explained viral illnesses can exacerbate diseases  - Lung transplantation: He is interested in this concept.  He wants early referral to [facility] so he gets his options covered.  He knows he has to lose weight.  We discussed common barriers for lung transplantation including psychological Support, social support, financial costs.  He says he does not have any of this.  His BMI is 29 he needs to get to less than 27.  Explained to him nutrition is the way to go on this.  He is also going to do rehabilitation   - We also  discussed his emphysema component being small.  He will continue with Spiriva  = Oxygenation -he is using nocturnal oxygen.  His apple watch shows his oxygen levels to be normal now at night.  He is monitoring this.  He says that when he exerts heavily his oxygen does drop but on the 6-minute walk test he did not drop.  He will pay for a portable oxygen system on his own out-of-pocket.  Therefore we will do an order for this.  SYMPTOM SCALE - ILD 09/29/2020   O2 use ra  Shortness of Breath 0 -> 5 scale with 5 being worst (score 6 If unable to do)  At rest 0.5  Simple tasks - showers, clothes change, eating, shaving 1  Household (dishes, doing bed, laundry) 1  Shopping 1  Walking level at own pace 1.5  Walking up Stairs *2.25  Total (30-36) Dyspnea Score 7.25  How bad is your cough? Improved with ppi  How bad is your fatigue 0  How bad is nausea 0  How bad is vomiting?  0  How bad is diarrhea? 0.5 coffee  How bad is anxiety? 0.5  How bad is depression 0       CT Chest data  No results found.    OV 11/10/2020  Subjective:  Patient ID: Reginald Davis, male , DOB: Jan 17, 1946 , age 81 y.o. , MRN: [id number] , ADDRESS: [place] [address] PCP [name], MD Patient Care Team: [name], MD as PCP - General (Internal Medicine) End, Harrell Gave, MD as PCP - Cardiology (Cardiology)  This Provider for this visit: Treatment Team:  Attending Provider: Brand Males, MD    11/10/2020 -   Chief Complaint  Patient presents with   Follow-up    Doing well    IPF dx givien 08/26/20 based on "probable UIP on  CT + negative srology, +  age > 67, _ white + male + prior smoker + heart burn/GERD hx-> this is "IPF"  -Last CT scan of the chest December 2021  - Esbriet start date -> Oct 26, 2020   - Pulm rehab Mar 2022   Associated mild emphysema present -started Spiriva    HPI Reginald Davis 75 y.o. -presents with his wife. This is for IPF follow-up. He  finally got hold of his pirfenidone. He started this 10/26/2020. So far he is tolerating it well. Earlier this week on 11/08/2020 he started pulmonary rehabilitation. He feels stable. Wife feels he is an under perceiver. On a 6-minute walk test he dropped to as low as 89%. He had a pulmonary function test at [facility] that showed decline in DLCO. He is worried about this. We walked him again today and his pulse ox did drop to 88%. That could have been a variation in place but clearly this a difference suggesting progression. However he is tolerating his pirfenidone fine. No side effects as evidenced below in the symptom score. He really wants to start ASAP on clinical trials. He is already met with [facility] for transplant as an option. He had to consent forms one for phase 3 IV infusion PRomedior and another for Phase 2 Pliant Part C. The latter has moved to Part D which is higher dose. He had more questions about this. We went over several components of clinical trials documented below. Explained to him about therapeutic misconception and secondary intent being therapeutic gain. Advised him that the primary intent should always be about drug development and contribution to science. He understood this. Explained to him currently there is supply chain issues with a phase 3 study. However the phase 2 sutdy is active. However he were to wait a few months before he is eligible for either study because he just started on pirfenidone. He understands this. We have given him an updated consent form. PFT [facility] feb 2022 FVC 3.55L DLCO 11.02    Telephone call 11/30/20   Nov 2021 -> March 2022 with [place]  - FVC up - dlco down  Compared to [facility] feb 2022  -March 2022 with  [place]  - FVC down  - DLCO up   Overall  - would stay stable  - there is mixed signal - nothing to do for now  - allow anti fibrotic kick in  - be patient   OV 12/21/2020  Subjective:  Patient ID: Reginald Davis, male , DOB:  [birth year] , age 62 y.o. , MRN: [id number] , ADDRESS: [place] [address] PCP [name], MD Patient Care Team: [name], MD as PCP - General (Internal Medicine) End, Harrell Gave, MD as PCP - Cardiology (Cardiology)  This Provider for this visit: Treatment Team:  Attending Provider: Brand Males, MD    12/21/2020 -   Chief Complaint  Patient presents with   Follow-up    PFT performed 11/25/20.  Pt states he has been doing okay and denies any complaints.     IPF dx givien 08/26/20 based on "probable UIP on CT + negative srology, +  age > 34, + white + male + prior smoker + heart burn/GERD hx-> this is "IPF"  -Last CT scan of the chest December 2021  - Esbriet start date -> Oct 26, 2020   [facility] rehab Mar 2022  - Clinical trial education - Mar 2022 (Pliant D ICF given)  -  PFF concept introduction - May 2022   Associated mild emphysema present -started Spiriva  Normal Echo May 2021  HPI Reginald Davis 75 y.o. -returns for follow-up with his wife.  He is now on pirfenidone since mid 01/27/2021 and is tolerating it well.  He reports intentional weight loss.  He has some mild GI side effects that are reflected in the symptom score and this is stable.  He says with a combination of intentional weight loss and rehabilitation and oxygen use [with exertion] his resting heart rate is now in the 60s.  He showed me his apple watch curve about that.  He is pretty excited about that.  He has had [place] for short of Covid vaccine mRNA.  He is really interested in clinical trials.  He has met with the research coordinator and is scheduled himself for Pliant study visit mid May 2022.  He has read the consent.  He is keeping up with the literature on pulmonary fibrosis.  He is pretty excited about an upcoming IND application approval for the [place] for [employer] BUI [id number].  We will be a study site for this but probably in the fall or the winter  2022.  I told him I do not know much details about this drug as yet.  He says he will start the Pliant study currently.  He wants to know about travel.  He has some prednisone and hand and also antibiotics.  We will go to [place] and [place].  He will take his portable oxygen system with him.  I have approved this travel.  At room air at rest he is fine.  When he exerts himself with treadmill and exercise he uses 2-3 L pulse.  Most recent liver function test was normal.  This was in December 12, 2020.  He is worried about his progression.  Particular [facility] test showed a drop.  He then had pulmonary function test with [place].  I put these results together.  It appears that there is lot of fluctuation particularly with the [facility] test.  But on average it appears his DLCO is declined since he has been with [place] but his FVC is stable.   He is enjoying his rehabilitation.    Results for HUNG, RHINESMITH (MRN [id number]) as of 12/21/2020 11:06  Ref. Range 07/12/2020 11:53 10/24/2020 [facility] 11/25/2020 08:52  FVC-Pre Latest Units: L 3.36 3.55 3.43  FVC-%Pred-Pre Latest Units: % 88 100% 94   Results for JAYVIAN, ESCOE (MRN [id number]) as of 12/21/2020 11:06  Ref. Range 07/12/2020 11:53 10/24/2020 [facility] 11/25/2020 08:52  DLCO cor Latest Units: ml/min/mmHg 16.88 11.02 13.45  DLCO cor % pred Latest Units: % 73 49.3 60   PFT   Subjective:  Patient ID: Reginald Davis, male , DOB: 05-Jun-1946 , age 61 y.o. , MRN: [id number] , ADDRESS: [place] [address] PCP [name], MD Patient Care Team: [name], MD as PCP - General (Internal Medicine) Constance Haw, MD as PCP - Electrophysiology (Cardiology) Skeet Latch, MD as PCP - Cardiology (Cardiology)  This Provider for this visit: Treatment Team:  Attending Provider: Brand Males, MD    08/22/2021 -   Chief Complaint  Patient presents with   Follow-up    Pt states he has had diarrhea for the past 6 weeks and is  unsure if it is due to his medication.     IPF dx givien 08/26/20 based on "probable UIP on  CT + negative srology, +  age > 49, + white + male + prior smoker + heart burn/GERD hx-> this is "IPF"  -Last CT scan of the chest December 2021  - Esbriet start date -> Oct 26, 2020   - Pulm rehab Mar 2022  - Clinical trial education - Mar 2022 [facility] D ICF given)     - Pliant study participant Leona Singleton [phone] and was randomized 22Jun2022  - PFF concept introduction - May 2022   Associated mild emphysema present -started Spiriva  Normal Echo May 2021  HPI Reginald Davis 75 y.o. -   IPF: [facility] visit for OPF. On esbrit and also study (IMP v placebo). Has study  visit#10 in 19Dec, then come back for EOT on 30Jan and EOS on 14Feb and then he is done.  From a respiratory standpoint his symptom scores are slowly actually improved.  His FVC also shows slow improvement over time.  In August 2022 he had a walk test at [facility] and this shows improvement in walking distance.  His weight is stable.  He had recent high-resolution CT chest.  Compared to September 2022 his ILD is stable but compared to remote past there is progression.  Overall he is pleased with the symptoms and stability.  He uses oxygen 2 L at night and 3 L pulsed with exercise and while traveling.  He continues on pirfenidone and study drug.  He should finish the study next month or so.  Rash: Sometime around September 2022 or so he developed punctate rash in his forearms and his back.  He says this still persist but it is stable its not any worse.  He is not overly concerned about this  Main new issue is that since the last 6 weeks he has had diarrhea.  Started as mild but since then is gotten progressive.  He says currently it is severe.  He is having a lot of flatulence and when he has significant amount of flatulence he is having some amount of stool in his underwear.  For the last 3 weeks he is taking align probiotic.  For the last  1 week he is also taking a protein shake but he says there is no sweetener in it.  For the last 1 week he is started Imodium.  After this diarrhea is better.  He still having diarrhea 3-4 times a day.  He is taking Imodium 3-4 times a day.  The stool is definitely more formed but it still worrisome for him.  He says he is cut down on nuts and fruits.  He denies any artificial sweetener.  Did medication review for diarrhea causing drugs: The only new introduction is a study drug and prior to that was the pirfenidone.  However he is on other drugs chronically that can cause diarrhea and this includes colchicine greater than 20%, Diovan greater than 5% and PPI.  Initially did not complain about pain but when I asked him he said sometimes he has some right lower quadrant discomfort.  There is no fever or bloody stools.  He had recent safety labs on 08/15/2021.  I personally reviewed this and this is normal.     Upcoming travel: He is plan to go to [place] in January 2022.  This is for 1-2 weeks and is to visit his daughter and his only grandchild.  He wants his diarrhea significantly addressed before the trip.  CT Chest data  No results found.  SYMPTOM SCALE - ILD 09/29/2020  11/10/2020 Now on esbriet sinc 10/26/20 12/21/2020 esbriet + 171# 08/22/2021 Esbriet and Pliant study drug , 173#  O2 use ra  ra a t rest 2-3L with exercise pulsed 2L [place] at night, 3L pulsed during exerise and travels  Shortness of Breath 0 -> 5 scale with 5 being worst (score 6 If unable to do)     At rest 0.5 0 0.5 1  Simple tasks - showers, clothes change, eating, shaving 1 00 0 0  Household (dishes, doing bed, laundry) 1 0 0.5 1  Shopping 1 0 0.5 1  Walking level at own pace 1.5 1 1  0  Walking up Stairs *2.25 2 2 2   Total (30-36) Dyspnea Score 7.25 3 6 5   How bad is your cough? Improved with ppi 1 1 `  How bad is your fatigue 0 0 0.5 `  How bad is nausea 0 0 0 0  How bad is vomiting?  0 00 0 0  How bad is  diarrhea? 0.5 coffee 0 0.25 1  How bad is anxiety? 0.5 0 0.5 1  How bad is depression 0 0 0 0   Walk test Simple office walk 185 feet x  3 laps goal with forehead probe 08/09/2020  11/10/2020 - [facility] 89% and HR111. 1296 fet. 0L o2 needed. Pl JR 111/ done 10/24/20  08/22/2021 - 6[facility]m at [facility] 05/01/21 - walked 1671 feet wihout o2. This is an imprement   O2 used ra ra ra  Number laps completed 3 3 3   Comments about pace normal nL   Resting Pulse Ox/HR 96% and 80/min 96% and 90.min 99% and 60  Final Pulse Ox/HR 94% and 90/min 88% and 97/min 95% and 94  Desaturated </= 88% no yes yno  Desaturated <= 3% points no tyes Yes, 4  Got Tachycardic >/= 90/min yes yes yes  Symptoms at end of test dyspnea Some dyspnea Mild dyspea  Miscellaneous comments x       OV 08/22/2021 PFT  PFT Results Latest Ref Rng & Units 11/25/2020 07/12/2020  FVC-Pre L 3.43 3.36  FVC-Predicted Pre % 94 88  FVC-Post L - 3.41  FVC-Predicted Post % - 90  Pre FEV1/FVC % % 74 72  Post FEV1/FCV % % - 74  FEV1-Pre L 2.53 2.41  FEV1-Predicted Pre % 97 88  FEV1-Post L - 2.51  DLCO uncorrected ml/min/mmHg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

## 2021-08-23 ENCOUNTER — Encounter: Payer: Self-pay | Admitting: Internal Medicine

## 2021-08-23 LAB — URINE CULTURE
MICRO NUMBER:: 12750410
Result:: NO GROWTH
SPECIMEN QUALITY:: ADEQUATE

## 2021-08-23 LAB — CLOSTRIDIUM DIFFICILE BY PCR: Toxigenic C. Difficile by PCR: NEGATIVE

## 2021-08-24 NOTE — Telephone Encounter (Signed)
Do not know. UA is negaive. C diff pcr negative. I think rest of stool studies pending  HOw often are you taking colchicine?

## 2021-08-24 NOTE — Telephone Encounter (Signed)
Ok the colchicine is not cause of diarrhea. Let us regroup after rest of stool results back, Sponsor okayed taking lomotil if diarrhea bad.

## 2021-08-24 NOTE — Telephone Encounter (Signed)
This has been corrected. Thakns for the info

## 2021-08-25 LAB — OVA AND PARASITE EXAMINATION
CONCENTRATE RESULT:: NONE SEEN
MICRO NUMBER:: 12750409
SPECIMEN QUALITY:: ADEQUATE
TRICHROME RESULT:: NONE SEEN

## 2021-08-26 LAB — GI PROFILE, STOOL, PCR

## 2021-08-28 ENCOUNTER — Encounter: Payer: Self-pay | Admitting: *Deleted

## 2021-08-28 ENCOUNTER — Other Ambulatory Visit: Payer: Self-pay

## 2021-08-28 DIAGNOSIS — J84112 Idiopathic pulmonary fibrosis: Secondary | ICD-10-CM

## 2021-08-28 DIAGNOSIS — Z006 Encounter for examination for normal comparison and control in clinical research program: Secondary | ICD-10-CM

## 2021-08-28 NOTE — Research (Signed)
Title: A randomized, double-blind, multicenter, dose-ranging, placebo-controlled Phase 2a evaluation of the safety, tolerability and pharmacokinetics of [id number] in participants with idiopathic pulmonary fibrosis (IPF)   Patients will be randomized in a 3:1 ratio to one of two treatment arms:  320 mg [id number] or placebo.   [facility] [place] Coordinator note :   This visit for [name] with DOB: 04-Dec-1945 on 08/28/2021 for the above protocol is Visit #10 and is for purpose of research.   The consent for this encounter is under Protocol Version Amendment 3.0 , Investigator Brochure Edition V.5.0 Addendum, Consent Version 5.1 and is currently IRB approved.   Subject expressed continued interest and consent in continuing as a study subject. Subject confirmed that there was no change in contact information (e.g. address, telephone, email). Subject thanked for participation in research and contribution to science.  In this visit 08/28/2021 the subject will be evaluated by Sub-Investigator named Unice Cobble, MD. This research coordinator has verified that the above investigator is up to date with his training logs.   The Subject was informed that the PI Dr. Chase Caller continues to have oversight of the subject's visits and course  through relevant discussions, reviews and also specifically of this visit by routing of this note to the [place].  All study visit 10 procedures and assessments were completed per the above mentioned protocol. The subject reported one new AE of broken tooth, which is to be repaired tomorrow by his dentist. See the subject's study binder for the status of all other AE's. The subject is 100% compliant with taking study medication and doing the diary. Please refer to the subject's study binder for further details of the visit. He will return in 6 weeks for the End of Treatment study visit.  Signed by Hale Drone, MS, [place]  Coordinator [place], [place] 12:51 PM 08/28/2021

## 2021-08-29 NOTE — Progress Notes (Signed)
Reginald Davis, DOB [birth year], was seen 19 Dec, 2022 as subject in a clinical trial /Protocol # PLN - [id number] of IPF. Cardiopulmonary symptoms are stable. Other or new symptoms denied. Pertinent physical findings include:OD ptosis with bland scleritis & slight icterus. Pacemaker L upper anterior thorax. Slightly accentuated S2 @ LLSB; diffuse low grade dry rales posteriorly. Reducible ventral hernia. Faint , bland ,irregular erythematous rash, LUE > RUE forearms. All physical findings NCS                                                                     Hendricks Limes MD,SI

## 2021-09-01 ENCOUNTER — Other Ambulatory Visit: Payer: Self-pay

## 2021-09-01 ENCOUNTER — Encounter: Payer: Self-pay | Admitting: Adult Health

## 2021-09-01 ENCOUNTER — Telehealth (INDEPENDENT_AMBULATORY_CARE_PROVIDER_SITE_OTHER): Payer: Medicare Other | Admitting: Adult Health

## 2021-09-01 DIAGNOSIS — K521 Toxic gastroenteritis and colitis: Secondary | ICD-10-CM

## 2021-09-01 DIAGNOSIS — I2510 Atherosclerotic heart disease of native coronary artery without angina pectoris: Secondary | ICD-10-CM | POA: Diagnosis not present

## 2021-09-01 DIAGNOSIS — J84112 Idiopathic pulmonary fibrosis: Secondary | ICD-10-CM | POA: Diagnosis not present

## 2021-09-01 NOTE — Patient Instructions (Signed)
Continue on current regimen  Follow up with Dr. Chase Caller in 2 months and As needed
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
## 2021-09-06 ENCOUNTER — Other Ambulatory Visit: Payer: Self-pay | Admitting: Internal Medicine

## 2021-09-06 DIAGNOSIS — R0609 Other forms of dyspnea: Secondary | ICD-10-CM

## 2021-09-06 DIAGNOSIS — I2510 Atherosclerotic heart disease of native coronary artery without angina pectoris: Secondary | ICD-10-CM

## 2021-09-08 ENCOUNTER — Telehealth: Payer: Medicare Other | Admitting: Adult Health

## 2021-09-13 NOTE — Progress Notes (Signed)
Title: A randomized, double-blind, multicenter, dose-ranging, placebo-controlled Phase 2a evaluation of the safety, tolerability and pharmacokinetics of [id number] in participants with idiopathic pulmonary fibrosis (IPF)     Xxxxxx  LATE ENTRY for visit conducted 06/08/21   but includes events that is subsequent to this visit given the complexity of decision making involved  S: In the study visit he complains of a rash as follows ->  patient with known lichen planus is wondering if esbriet is the cause of his new (recurrent) skin rash in non-skin exposed areas.  He told me that when he was first diagnosed with Lichen he used to get sun treatment and it would help. Then after he went on esbriet, he covers his body. Rash is now back on non-exposed areas including back, forearms. I think biopsy showed lichenoid reaction. He is wondering if this because of esbriet or lichen recurrence because he is not exposing himself to sun. He also sent this article from [place] where Esbriet *[email or web address]).  His pirfenidone/Esbriet history and rash history is as follows  started Esbriet titration doses February 16 with 267 mg dosing. Completed titration doses 11/24/2020.   [Started full 801 mg (3x3x) titration doses from 267 MG bottle early March 2022]  Started full RX# [id number] (801 mg) on 11/25/2020. 90 Tablets.   Rash first noticed roughly second week of July, I believe.  [id number]  I touched based with Dr Boyce Medici medical monitor for [facility] study who replied Jun 10, 2021  , "Based on the healthy volunteer data from Ph1 studies (n>280) and the unblinded Phase 2a data for Part B/C, there have not been any signals to associate study medication ([id number]) with either lichen planus, lichenoid reaction or hypertension. "  Dr Carmelia Bake of [facility] emailed saying he was open to idea that pirfenidone could be contributing to the rash.  We communicated this with the subject on  June 11, 2021 .  [employer] then subsequently sent a formal note on  June 13, 2021 which we shared with the subject on June 19, 2021.  Subject then got back to [place] on the same day June 19, 2021 saying that he was going to continue taking both study drug and pirfenidone plus the corticosteroid cream prescribed by Dr.  Jarome Matin his dermatologist.  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 The sponsor for the study drug did approve the application of this corticosteroid cream in communication with [place].    Stated that it w O Rash as documented in paper source Rest of the exam per paper source  A IPF Rash as AE - unrelated to study drug.  Unclear if related to pirfenidone.  Plan    - per study protocol      SIGNATURE    Dr. Brand Males, M.D., F.C.C.P,  Pulmonary and Critical Care Medicine Staff Physician, [place] Director - Interstitial Lung Disease  Program  Pulmonary [place] at [place], [place], [address]  NPI Number:  NPI #[id number]  Pager: [phone], If no answer  -> Check AMION or Try [place] Telephone (clinical office): [phone] Telephone (research): [phone]  10:14 AM 09/13/2021

## 2021-09-13 NOTE — Patient Instructions (Signed)
ICD-10-CM   1. Research study patient  Z00.6     2. IPF (idiopathic pulmonary fibrosis) ([place])  J84.112       Per prpotocol

## 2021-09-18 ENCOUNTER — Other Ambulatory Visit: Payer: Self-pay | Admitting: Cardiovascular Disease

## 2021-10-02 ENCOUNTER — Other Ambulatory Visit: Payer: Self-pay | Admitting: Pharmacist

## 2021-10-02 DIAGNOSIS — J84112 Idiopathic pulmonary fibrosis: Secondary | ICD-10-CM

## 2021-10-02 MED ORDER — PIRFENIDONE 801 MG PO TABS: 1.0000 | ORAL_TABLET | Freq: Three times a day (TID) | ORAL | 1 refills | Status: DC

## 2021-10-02 NOTE — Telephone Encounter (Signed)
Refill sent for ESBRIET to [facility], dispensing) for Esbriet: [phone]  Dose: 801 mg three times daily  Last OV: 09/01/21 Provider: Dr/ Chase Caller  Next OV: 10/09/21  Knox Saliva, PharmD, MPH, BCPS Clinical Pharmacist (Rheumatology and Pulmonology)

## 2021-10-09 ENCOUNTER — Other Ambulatory Visit: Payer: Self-pay

## 2021-10-09 ENCOUNTER — Encounter: Payer: Medicare Other | Admitting: *Deleted

## 2021-10-09 ENCOUNTER — Encounter (INDEPENDENT_AMBULATORY_CARE_PROVIDER_SITE_OTHER): Payer: Medicare Other | Admitting: Internal Medicine

## 2021-10-09 DIAGNOSIS — J84112 Idiopathic pulmonary fibrosis: Secondary | ICD-10-CM

## 2021-10-09 DIAGNOSIS — Z006 Encounter for examination for normal comparison and control in clinical research program: Secondary | ICD-10-CM

## 2021-10-09 NOTE — Research (Signed)
Title: A randomized, double-blind, multicenter, dose-ranging, placebo-controlled Phase 2a evaluation of the safety, tolerability and pharmacokinetics of [id number] in participants with idiopathic pulmonary fibrosis (IPF)   Patients will be randomized in a 3:1 ratio to one of two treatment arms:  320 mg [id number] or placebo.   [facility] [place] Coordinator note :   This visit for [name] with DOB: [birth year] on 10/09/2021 for the above protocol is End of Treatment Visit  and is for purpose of research.   The consent for this encounter is under Protocol Version Amendment 3.0 , Investigator Brochure Edition v.5.0, Consent Version 5.1 and is currently IRB approved.   Subject expressed continued interest and consent in continuing as a study subject. Subject confirmed that there was no change in contact information (e.g. address, telephone, email). Subject thanked for participation in research and contribution to science.  In this visit 10/09/2021 the subject will be evaluated by Principal Investigator named Dr. Chase Caller. This research coordinator has verified that the above investigator is up to date with his training logs.   The Subject was informed that the PI Dr. Chase Caller continues to have oversight of the subject's visits and course  through relevant discussions, reviews and also specifically of this visit by routing of this note to the [place].  All End of Treatment assessments and procedures were completed per the above mentioned protocol. No new AE's were reported. See the subject's study binder for the status of ongoing AE's. The subject took his final dose of study medication prior to the visit this morning and all IP was returned at this visit. He is 100% compliant with taking study drug and completing the diary. Refer to the subject's study binder for further details of the visit. He will return in 2 weeks for the End of Study visit.  Signed by Hale Drone, MS,  [place] Coordinator  [place], [place] 11:37 AM 10/09/2021

## 2021-10-09 NOTE — Patient Instructions (Signed)
ICD-10-CM   1. Research study patient  Z00.6     2. IPF (idiopathic pulmonary fibrosis) ([place])  C62.376       Per [facility]ptoco  Will arrange [facility] vist end of march 2023

## 2021-10-09 NOTE — Progress Notes (Signed)
Title: A randomized, double-blind, multicenter, dose-ranging, placebo-controlled Phase 2a evaluation of the safety, tolerability and pharmacokinetics of [id number] in participants with idiopathic pulmonary fibrosis (IPF)   Patients will be randomized in a 3:1 ratio to one of two treatment arms:  320 mg [id number] or placebo.   Protocol #: [id number], Clinical Trials EUDRACT# 2019-002709-23  ** Sponsor [employer] [email or web address]   ([place] [address])   Xxxx  This visit for Reginald Davis with DOB: 01-05-1946 on 10/09/2021 for the above protocol is Visit/Encounter # end of treatment visit  and is for purpose of research . Subject/LAR expressed continued interest and consent in continuing as a study subject. Subject thanked for participation in research and contribution to science.    He has new new complaints.  He got public information about the study results so far.  He is excited about it.  He asked about compassionate use of the study drug but I indicated to him this is probably not possible.  He asked about going into the phase 3.  I told him that we will have to wait for the phase 3 trial to start if it ever starts.  I did inform him that typically sponsor as well and exclude patients who have been on the trial in the past.  He expressed understanding.  He says he will go into another trial.  Today's end of treatment.  No new complaints.  He feels good.  Next visit is end of study.  He does have [employer] [facility] pulmonary appointment coming up next month  Object -Overall nonfocal exam except he is got this fine crackles.  Otherwise rest of it is baseline   A Research IPF  Plan  -Study procedures per protocol    SIGNATURE    Dr. Brand Males, M.D., F.C.C.P, ACRP-CPI Pulmonary and Critical Care Medicine Research Investigator, [facility] [place] Staff Physician, [place] Director - Interstitial Lung Disease  Program   Pulmonary [facility] [place], [place], [address]   Pager: [phone], If no answer  OR between  19:00-7:00h: page [phone] Telephone (research): [phone]  4:21 PM 10/09/2021   4:21 PM 10/09/2021

## 2021-10-16 ENCOUNTER — Ambulatory Visit (INDEPENDENT_AMBULATORY_CARE_PROVIDER_SITE_OTHER): Payer: Medicare Other

## 2021-10-16 DIAGNOSIS — I442 Atrioventricular block, complete: Secondary | ICD-10-CM

## 2021-10-16 LAB — CUP PACEART REMOTE DEVICE CHECK
Battery Remaining Longevity: 130 mo
Battery Remaining Percentage: 95.5 %
Battery Voltage: 3.01 V
Brady Statistic AP VP Percent: 1 %
Brady Statistic AP VS Percent: 1 %
Brady Statistic AS VP Percent: 19 %
Brady Statistic AS VS Percent: 80 %
Brady Statistic RA Percent Paced: 1 %
Brady Statistic RV Percent Paced: 19 %
Date Time Interrogation Session: 20230206020016
Implantable Lead Implant Date: 20220427
Implantable Lead Implant Date: 20220427
Implantable Lead Location: 753859
Implantable Lead Location: 753860
Implantable Pulse Generator Implant Date: 20220427
Lead Channel Impedance Value: 440 Ohm
Lead Channel Impedance Value: 440 Ohm
Lead Channel Pacing Threshold Amplitude: 0.75 V
Lead Channel Pacing Threshold Amplitude: 0.875 V
Lead Channel Pacing Threshold Pulse Width: 0.5 ms
Lead Channel Pacing Threshold Pulse Width: 0.5 ms
Lead Channel Sensing Intrinsic Amplitude: 12 mV
Lead Channel Sensing Intrinsic Amplitude: 4.3 mV
Lead Channel Setting Pacing Amplitude: 1.125
Lead Channel Setting Pacing Amplitude: 2 V
Lead Channel Setting Pacing Pulse Width: 0.5 ms
Lead Channel Setting Sensing Sensitivity: 2 mV
Pulse Gen Model: 2272
Pulse Gen Serial Number: 3920271

## 2021-10-19 NOTE — Progress Notes (Signed)
Remote pacemaker transmission.   

## 2021-10-24 ENCOUNTER — Encounter: Payer: Medicare Other | Admitting: Internal Medicine

## 2021-10-24 ENCOUNTER — Other Ambulatory Visit: Payer: Self-pay

## 2021-10-24 ENCOUNTER — Encounter: Payer: Medicare Other | Admitting: *Deleted

## 2021-10-24 DIAGNOSIS — J84112 Idiopathic pulmonary fibrosis: Secondary | ICD-10-CM

## 2021-10-24 DIAGNOSIS — Z006 Encounter for examination for normal comparison and control in clinical research program: Secondary | ICD-10-CM

## 2021-10-24 NOTE — Research (Signed)
Title: A randomized, double-blind, multicenter, dose-ranging, placebo-controlled Phase 2a evaluation of the safety, tolerability and pharmacokinetics of [id number] in participants with idiopathic pulmonary fibrosis (IPF)   Patients will be randomized in a 3:1 ratio to one of two treatment arms:  320 mg [id number] or placebo.   Protocol #: [id number], Clinical Trials EUDRACT# 2019-002709-23  ** Sponsor [employer] [email or web address]   ([place] [address])   Protocol Version Amendment 3 (ver.3.0) for 10/24/2021 Date of 19OCT2021 double checked  yes Consent Version 5.1 dated 11Jan2022, approved  for 10/24/2021 date of double checked yes Investigator Brochure Version 5.0  Addendum for 10/24/2021 date of 21JAN2022   Key Inclusion Criteria:   Age >/= 76 years old Confident diagnosis of IPF  up to 5 years. If IPF diagnosis is within > 3 to = 5 years at screening, the participant must have evidence of progression within the last 24 months, as defined by decline in FVC percent predicted based on a relative decline of = 5% FVC% pred >/=45%  DLCO (hgb-adjusted) >/=30% Treatment with nintedanib or pirfenidone allowed, But must have stable dose =3 months before Screening Visit and remain unchanged during the study   Key Exclusion Criteria:   Receiving any non-approved agent intended for treatment of fibrosis in IPF FEV1 over the FVC ratio < 0.7 at screening Active infection,  that can affect FVC measurement during Screening or  Randomization Extent of emphysema > fibrotic changes on most recent HRCT IPF exacerbation within 6 months of Screening  Severe pulmonary hypertension Smoking of any kind w/in 3 months of Screening or unwilling to avoid smoking throughout the study Renal impairment or end-stage kidney disease requiring dialysis History of unstable /deteriorating cardiac/pulmonary disease (other than IPF) w/in 6 months of Screening Any ongoing known malignancy, except for  localized cancers (i.e. basal cell carcinoma) Medical or surgical condition known to affect drug absorption Surgeries scheduled during the study period    Side-Effects: [id number] was generally well tolerated in over 280 healthy participants with no clinical safety concerns or dose-limiting toxicities observed for single doses up to 640mg  and multiple doses up to 320mg  QD administered for up to 14 days. The major side-effects reported have been headache (mild) and constipation (mild) in 3-76% of subjects. Other side effects, in 1-2% of subjects, were mild to moderate, with fever being the only severe side-effect reported.  Mechanism: Selective dual aV6 and aV1 integrin inhibitor with antifibrotic activity. [id number] prevents aV6 and aV1 from binding to the RGD sequence of latency-associated peptide (LAP), this blocks the release of activated TGF-1, thereby blocking the activation of pro-fibrotic pathways. Additionally, QAS-34196 has been shown to reduce collagen synthesis and gene expression in human IPF tissue.   Interactions: Acts as a substrate of P-gp, OATP1B1, OATP1B3, OAT3 Inhibitors or inducers of these enzymes may affect the intestinal absorption, hepatic uptake or renal uptake of [id number] Metabolized by CYP3A4, CYP2C9, CYP2C19 Potent inhibitors or inducers of these enzymes should be avoided Each CYP enzyme metabolizes < 10% of the parent compound Since multiple CYP enzymes are involved, clinically relevant drug-drug interactions are less likely, as there are other alternative pathways A potent inhibitor of multiple pathways should be used with caution as that has the potential to elicit a clinically relevant drug-drug interaction i.e. medication that inhibits CYP3A4, CYP2C9, CYP2C19 [id number]-94174 is not an inducer of CYP1A2, CYP2B6, or CYP3A4 Showed moderate inhibition of P-gp and MATE1, substrates of P-gp and MATE1 may  have increased exposure when coadministered with  [id number] Metabolized by  extrahepatic enzymes: CYP1A1, UGT1A7 Clinical relevance relating to drug interactions is unknown Stable when incubated with human recombinant MAO-A and MAO-B VPX-10626 is not an inhibitor or inducer of CYP isoforms and may be co-administered with substrates of CYP isoforms without risk of DDI.  Interactions with specific drugs: Potential for increased exposure of nintedanib Recommend separating time of administration of RSW-54627 and nintedanib Drug interactions with pirfenidone are not expected Use of corticosteroids should be discussed beforehand with the medical monitor   Clinical drug interactions studies have not been performed at this time and therefore guidance is based on the in-vitro studies  Disallowed Medications/Foods/Supplements: Fluconazole Digoxin Rifampin Grapefruit Grapefruit-containing foods and beverages [place]. Johns Wort   Pharmacokinetics: Tmax : 2-3 hours Half-life: approximately 48 hours Steady state reached in 5-7 days Minimal renal clearance (8.5% of drug is cleared by the kidneys)    Administration [phone] should be administered on an empty stomach May be administered via feeding tube   Safety:   Edition Addendum to Edition 5.0 - 30 September 2020   Animal studies: No effect on neurobehavior No effect on respiratory parameters No effects on blood pressure Showed an increase in QT and QTc The high dose 400 mg/kg showed an increase in mean heart rate   Cardiovascular effects in mice after administration of [id number]-99371: Dose-dependent increases in blood pressure Increase in PR and QRS duration Increase in arrhythmias  Reproductive animal toxicology studies at the low and medium doses, 150 and 300 mg/kg/day, revealed no impact on: maternal survival, fetal body weight, food parameters, uterine and ovarian parameters, no external malformations  When the high dose 600 mg/kg/day was tested in rabbits, the rabbits displayed  increased morbidities and increased abortions   Phase 1 studies:   Type of TEAE [id number] n=32 Placebo n=14 GI disorders 7 (22%) 0 Infections 3 (9%) 0 Musculoskeletal 1 (3%) 0 Nervous system disorders 2 (6%) 0   Most common treatment-emergent adverse event (TEAE):  headache  The most common drug-related adverse events were: nausea, diarrhea, abdominal distension. These occurred in 12.5% of participants  There were no deaths or serious adverse events in any of the Phase I studies There were no clinically meaningful changes in vital signs, telemetry or laboratory parameters [phone] did not display any clinically significant effect on ECG (iIncluding Qtc, HR, RR, PR and QRS)  Study [id number] All TEAEs were mild and deemed unrelated to study drug One TEAE, viral infection, led to discontinuation of IP No deaths nor SAEs  As of the 10 June 2020 cutoff date, preliminary data from ongoing and discontinued Phase 2a studies suggest no new safety concerns.  All SAE's reported to date were considered unrelated to study drug by the investigator. All treatment-emergent SAE's were known manifestations of the underlying  diseases (IPF, PSC or ARDS) and considered by the investigator to be most likely due to disease progresssion.  Part D of the study will evaluate approximately 28 participants at the 320 mg dose for at least 24 weeks to further characterize the safety profile of TIR-44315. Ongoing safety and FVC assessments will continue until the last participant reaches 24 weeks of the study; therefore, safety and FVC assessments may extend to 48 weeks in participants randomized earlier in the study.  [facility] [place] Coordinator note :   This visit for [name] with DOB: 11/12/1945 on 10/24/2021 for the above protocol is End of Study Visit and is for purpose of research.   The consent for this encounter  is under Protocol Version  Amendment 3.0 , Investigator Brochure Edition v.5.0, Consent Version 5.1 and is currently IRB approved.   Subject expressed continued interest and consent in continuing as a study subject. Subject confirmed that there was no change in contact information (e.g. address, telephone, email). Subject thanked for participation in research and contribution to science.  In this visit 10/24/2021 the subject will be evaluated by Principal Investigator named Dr. Chase Caller . This research coordinator has verified that the above investigator is up to date with his/her training logs.   The Subject was  informed that the PI Dr. Chase Caller continues to have oversight of the subject's visits and course  through relevant discussions, reviews and also specifically of this visit by routing of this note to the [facility].   This visit is a key visit of end of study. The PI is available for this visit.    All End of Study procedures and assessments were completed per the above mentioned protocol. No new AE's were reported. Ongoing AE's at the end of the study were noted in the subject's study binder. See the subject's study binder for further details of the visit. This is the final study visit for this protocol. The subject's study participation is now complete.  Signed by Hale Drone, [place] Coordinator [facility]  [place], [place] 11:54 AM 10/24/2021

## 2021-10-26 NOTE — Progress Notes (Unsigned)
Title: A randomized, double-blind, multicenter, dose-ranging, placebo-controlled Phase 2a evaluation of the safety, tolerability and pharmacokinetics of [id number] in participants with idiopathic pulmonary fibrosis (IPF)   Patients will be randomized in a 3:1 ratio to one of two treatment arms:  320 mg [id number] or placebo.   Protocol #: [id number], Clinical Trials EUDRACT# 2019-002709-23  ** Sponsor [employer] [email or web address]   ([place] [address])  Key Inclusion Criteria:   Age >/= 76 years old Confident diagnosis of IPF  up to 5 years. If IPF diagnosis is within > 3 to = 5 years at screening, the participant must have evidence of progression within the last 24 months, as defined by decline in FVC percent predicted based on a relative decline of = 5% FVC% pred >/=45%  DLCO (hgb-adjusted) >/=30% Treatment with nintedanib or pirfenidone allowed, But must have stable dose =3 months before Screening Visit and remain unchanged during the study   Key Exclusion Criteria:   Receiving any non-approved agent intended for treatment of fibrosis in IPF FEV1 over the FVC ratio < 0.7 at screening Active infection,  that can affect FVC measurement during Screening or  Randomization Extent of emphysema > fibrotic changes on most recent HRCT IPF exacerbation within 6 months of Screening  Severe pulmonary hypertension Smoking of any kind w/in 3 months of Screening or unwilling to avoid smoking throughout the study Renal impairment or end-stage kidney disease requiring dialysis History of unstable /deteriorating cardiac/pulmonary disease (other than IPF) w/in 6 months of Screening Any ongoing known malignancy, except for localized cancers (i.e. basal cell carcinoma) Medical or surgical condition known to affect drug absorption Surgeries scheduled during the study period    Side-Effects: [id number] was generally well tolerated in over 280 healthy participants with no clinical  safety concerns or dose-limiting toxicities observed for single doses up to 640mg  and multiple doses up to 320mg  QD administered for up to 14 days. The major side-effects reported have been headache (mild) and constipation (mild) in 3-76% of subjects. Other side effects, in 1-2% of subjects, were mild to moderate, with fever being the only severe side-effect reported.  Mechanism: Selective dual aV6 and aV1 integrin inhibitor with antifibrotic activity. DGL-87564 prevents aV6 and aV1 from binding to the RGD sequence of latency-associated peptide (LAP), this blocks the release of activated TGF-1, thereby blocking the activation of pro-fibrotic pathways. Additionally, PPI-95188 has been shown to reduce collagen synthesis and gene expression in human IPF tissue.   Interactions: Acts as a substrate of P-gp, OATP1B1, OATP1B3, OAT3 Inhibitors or inducers of these enzymes may affect the intestinal absorption, hepatic uptake or renal uptake of CZY-60630 Metabolized by CYP3A4, CYP2C9, CYP2C19 Potent inhibitors or inducers of these enzymes should be avoided Each CYP enzyme metabolizes < 10% of the parent compound Since multiple CYP enzymes are involved, clinically relevant drug-drug interactions are less likely, as there are other alternative pathways A potent inhibitor of multiple pathways should be used with caution as that has the potential to elicit a clinically relevant drug-drug interaction i.e. medication that inhibits CYP3A4, CYP2C9, CYP2C19 ZSW-10932 is not an inducer of CYP1A2, CYP2B6, or CYP3A4 Showed moderate inhibition of P-gp and MATE1, substrates of P-gp and MATE1 may  have increased exposure when coadministered with TFT-73220 Metabolized by extrahepatic enzymes: CYP1A1, UGT1A7 Clinical relevance relating to drug interactions is unknown Stable when incubated with human recombinant MAO-A and MAO-B URK-27062 is not an inhibitor or inducer of CYP isoforms and may be co-administered with  substrates of CYP isoforms without risk  of DDI.  Interactions with specific drugs: Potential for increased exposure of nintedanib Recommend separating time of administration of CNO-70962 and nintedanib Drug interactions with pirfenidone are not expected Use of corticosteroids should be discussed beforehand with the medical monitor   Clinical drug interactions studies have not been performed at this time and therefore guidance is based on the in-vitro studies  Disallowed Medications/Foods/Supplements: Fluconazole Digoxin Rifampin Grapefruit Grapefruit-containing foods and beverages St. Johns Wort   Pharmacokinetics: Tmax : 2-3 hours Half-life: approximately 48 hours Steady state reached in 5-7 days Minimal renal clearance (8.5% of drug is cleared by the kidneys)    Administration [id number] should be administered on an empty stomach May be administered via feeding tube   Safety:   Edition Addendum to Edition 5.0 - 30 September 2020   Animal studies: No effect on neurobehavior No effect on respiratory parameters No effects on blood pressure Showed an increase in QT and QTc The high dose 400 mg/kg showed an increase in mean heart rate   Cardiovascular effects in mice after administration of [id number]-65035: Dose-dependent increases in blood pressure Increase in PR and QRS duration Increase in arrhythmias  Reproductive animal toxicology studies at the low and medium doses, 150 and 300 mg/kg/day, revealed no impact on: maternal survival, fetal body weight, food parameters, uterine and ovarian parameters, no external malformations  When the high dose 600 mg/kg/day was tested in rabbits, the rabbits displayed increased morbidities and increased abortions   Phase 1 studies:   Type of TEAE [id number]-68127 n=32 Placebo n=14 GI disorders 7 (22%) 0 Infections 3 (9%) 0 Musculoskeletal 1 (3%) 0 Nervous system disorders 2 (6%) 0  Xxxxx  This visit for [name] with  DOB: 04-23-1946 on 10/26/2021 for the above protocol is Visit/Encounter #end of study visit and is for purpose of research. Subject/LAR expressed continued interest and consent in continuing as a study subject. Subject thanked for participation in research and contribution to science.    This is the end of study visit.  Patient has been off research medication product since the last visit.   Assessment Research subject Idiopathic Pulmonary fibrosis  Plan Completion of all research visits.  Proceed with standard of care protocols   SIGNATURE    Dr. Brand Males, M.D., F.C.C.P, ACRP-CPI Pulmonary and Critical Care Medicine Research Investigator, [facility] [place] Staff Physician, [place] Director - Interstitial Lung Disease  Program  Pulmonary [facility] and [facility] @ [place], [place], [address]   Pager: [phone], If no answer  OR between  19:00-7:00h: page [phone] Telephone (research): [phone]  9:26 AM 10/26/2021

## 2021-10-26 NOTE — Patient Instructions (Addendum)
ICD-10-CM   1. Research study patient  Z00.6     2. IPF (idiopathic pulmonary fibrosis) ([place])  J73.668      Per soc protocols

## 2021-11-01 DIAGNOSIS — Z87891 Personal history of nicotine dependence: Secondary | ICD-10-CM | POA: Diagnosis not present

## 2021-11-01 DIAGNOSIS — J439 Emphysema, unspecified: Secondary | ICD-10-CM | POA: Diagnosis not present

## 2021-11-01 DIAGNOSIS — J479 Bronchiectasis, uncomplicated: Secondary | ICD-10-CM | POA: Diagnosis not present

## 2021-11-01 DIAGNOSIS — Z7682 Awaiting organ transplant status: Secondary | ICD-10-CM | POA: Diagnosis not present

## 2021-11-01 DIAGNOSIS — Z79899 Other long term (current) drug therapy: Secondary | ICD-10-CM | POA: Diagnosis not present

## 2021-11-01 DIAGNOSIS — R0602 Shortness of breath: Secondary | ICD-10-CM | POA: Diagnosis not present

## 2021-11-01 DIAGNOSIS — Z950 Presence of cardiac pacemaker: Secondary | ICD-10-CM | POA: Diagnosis not present

## 2021-11-01 DIAGNOSIS — J84112 Idiopathic pulmonary fibrosis: Secondary | ICD-10-CM | POA: Diagnosis not present
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
## 2021-11-07 ENCOUNTER — Other Ambulatory Visit: Payer: Self-pay

## 2021-11-07 ENCOUNTER — Encounter (HOSPITAL_BASED_OUTPATIENT_CLINIC_OR_DEPARTMENT_OTHER): Payer: Self-pay | Admitting: Nurse Practitioner

## 2021-11-07 ENCOUNTER — Ambulatory Visit (INDEPENDENT_AMBULATORY_CARE_PROVIDER_SITE_OTHER): Payer: Medicare Other | Admitting: Nurse Practitioner

## 2021-11-07 VITALS — BP 144/74 | HR 58 | Ht 66.0 in | Wt 165.6 lb

## 2021-11-07 DIAGNOSIS — J84112 Idiopathic pulmonary fibrosis: Secondary | ICD-10-CM | POA: Diagnosis not present

## 2021-11-07 DIAGNOSIS — I10 Essential (primary) hypertension: Secondary | ICD-10-CM | POA: Diagnosis not present

## 2021-11-07 DIAGNOSIS — I5189 Other ill-defined heart diseases: Secondary | ICD-10-CM

## 2021-11-07 DIAGNOSIS — E785 Hyperlipidemia, unspecified: Secondary | ICD-10-CM | POA: Diagnosis not present

## 2021-11-07 DIAGNOSIS — I442 Atrioventricular block, complete: Secondary | ICD-10-CM

## 2021-11-07 DIAGNOSIS — Z950 Presence of cardiac pacemaker: Secondary | ICD-10-CM

## 2021-11-07 DIAGNOSIS — I2510 Atherosclerotic heart disease of native coronary artery without angina pectoris: Secondary | ICD-10-CM

## 2021-11-07 NOTE — Patient Instructions (Signed)
Medication Instructions:  Your Physician recommend you continue on your current medication as directed.    *If you need a refill on your cardiac medications before your next appointment, please call your pharmacy*  Follow-Up: At [facility], you and your health needs are our priority.  As part of our continuing mission to provide you with exceptional heart care, we have created designated Provider Care Teams.  These Care Teams include your primary Cardiologist (physician) and Advanced Practice Providers (APPs -  Physician Assistants and Nurse Practitioners) who all work together to provide you with the care you need, when you need it.  We recommend signing up for the patient portal called "MyChart".  Sign up information is provided on this After Visit Summary.  MyChart is used to connect with patients for Virtual Visits (Telemedicine).  Patients are able to view lab/test results, encounter notes, upcoming appointments, etc.  Non-urgent messages can be sent to your provider as well.   To learn more about what you can do with MyChart, go to [email or web address].    Your next appointment:   Please follow up with Dr. Oval Linsey in October

## 2021-11-08 ENCOUNTER — Telehealth (HOSPITAL_BASED_OUTPATIENT_CLINIC_OR_DEPARTMENT_OTHER): Payer: Self-pay | Admitting: Cardiovascular Disease

## 2021-11-08 NOTE — Telephone Encounter (Signed)
Left message for patient to call concerning "research documents" he brought to office in September 2022 ?

## 2021-11-09 ENCOUNTER — Telehealth: Payer: Self-pay | Admitting: *Deleted

## 2021-11-09 NOTE — Telephone Encounter (Signed)
Pt returning phone call... please advise  

## 2021-11-14 DIAGNOSIS — H59812 Chorioretinal scars after surgery for detachment, left eye: Secondary | ICD-10-CM | POA: Diagnosis not present

## 2021-11-14 DIAGNOSIS — H33302 Unspecified retinal break, left eye: Secondary | ICD-10-CM | POA: Diagnosis not present

## 2021-11-14 DIAGNOSIS — Z961 Presence of intraocular lens: Secondary | ICD-10-CM | POA: Diagnosis not present

## 2021-11-14 DIAGNOSIS — H20011 Primary iridocyclitis, right eye: Secondary | ICD-10-CM | POA: Diagnosis not present

## 2021-11-14 DIAGNOSIS — H35372 Puckering of macula, left eye: Secondary | ICD-10-CM | POA: Diagnosis not present

## 2021-11-14 DIAGNOSIS — H5212 Myopia, left eye: Secondary | ICD-10-CM | POA: Diagnosis not present

## 2021-11-14 DIAGNOSIS — H52222 Regular astigmatism, left eye: Secondary | ICD-10-CM | POA: Diagnosis not present

## 2021-11-22 DIAGNOSIS — R972 Elevated prostate specific antigen [PSA]: Secondary | ICD-10-CM | POA: Diagnosis not present

## 2021-11-22 DIAGNOSIS — N400 Enlarged prostate without lower urinary tract symptoms: Secondary | ICD-10-CM | POA: Diagnosis not present

## 2021-11-28 ENCOUNTER — Ambulatory Visit (INDEPENDENT_AMBULATORY_CARE_PROVIDER_SITE_OTHER): Payer: Medicare Other | Admitting: Internal Medicine

## 2021-11-28 ENCOUNTER — Encounter: Payer: Self-pay | Admitting: Internal Medicine

## 2021-11-28 ENCOUNTER — Other Ambulatory Visit: Payer: Self-pay

## 2021-11-28 VITALS — BP 124/76 | HR 57 | Ht 66.0 in | Wt 163.6 lb

## 2021-11-28 DIAGNOSIS — I2510 Atherosclerotic heart disease of native coronary artery without angina pectoris: Secondary | ICD-10-CM

## 2021-11-28 DIAGNOSIS — L853 Xerosis cutis: Secondary | ICD-10-CM

## 2021-11-28 DIAGNOSIS — J84112 Idiopathic pulmonary fibrosis: Secondary | ICD-10-CM

## 2021-11-28 DIAGNOSIS — Z7184 Encounter for health counseling related to travel: Secondary | ICD-10-CM

## 2021-11-28 MED ORDER — PREDNISONE 10 MG PO TABS: ORAL_TABLET | ORAL | 0 refills | Status: AC

## 2021-11-28 MED ORDER — DOXYCYCLINE HYCLATE 100 MG PO TABS: 100.0000 mg | ORAL_TABLET | Freq: Two times a day (BID) | ORAL | 0 refills | Status: DC

## 2021-11-28 MED ORDER — NIRMATRELVIR/RITONAVIR (PAXLOVID)TABLET: 3.0000 | ORAL_TABLET | Freq: Two times a day (BID) | ORAL | 0 refills | Status: AC

## 2021-11-28 NOTE — Progress Notes (Signed)
? ? ? ? ?OV 08/09/2020 ? ?Subjective:  ?Patient ID: Reginald Davis, male , DOB: 11-12-1945 , age 76 y.o. , MRN: [id number] , ADDRESS: [phone] ?[place] [address] ?PCP [name], MD ?Patient Care Team: ?[name], MD as PCP - General (Internal Medicine) ?[name], MD as PCP - Cardiology (Cardiology) ?Cardiologist is Dr. Ottie Glazier ?This Provider for this visit: Treatment Team:  ?Attending Provider: Brand Males, MD ? ? ? ?08/09/2020 -   ?Chief Complaint  ?Patient presents with  ? Consult  ?  COPD, DOE  ? ? ? ?HPI ?Reginald Davis 76 y.o. -  retired academic an Therapist, sports for a [employer]  History is given by the patient who also brought in very clearly typed notes about his history.  He says in the 1970s and 1980s while he was busy with his professional life he was a smoker and finally quit smoking in 1997.  His total pack smoking history might be 70 years.  He undergoes regular screening CT scans of the chest.  Most recently March 2021 that reported signs of emphysema and a small upper lobe nodule unchanged over many years and possible interstitial lung disease changes.  He says he was at baseline and doing well but in April 2021 he started noticing shortness of breath.  He was placed on Spiriva which improved his symptoms daily.  After that he felt like significantly improved.  He then was dealing with significant difficult to control blood pressures with Dr. Meda Coffee.  She has increased his dosage of the blood pressure medication.  He was remaining active walking approximately thousand steps daily and 30 minutes of walking 5 more days per week.  Then in September 2020 when he and his wife traveled to [place] and then drove to [place] which is an elevation of 4000-4500 feet.  After that they continue to [facility] [place] feet.  They got very short of breath.  Even had a fever episode.  His pulse oximetry dropped to 84-85.  He ended up in the emergency room  and was given a diagnosis of COPD exacerbation.  Given doxycycline and prednisone and then he improved.  Post improvement in September and post return to [facility] he has had difficult to control blood pressure but he does have some residual shortness of breath with exertion relieved by rest.  His apple watch has noticed desaturations at night that are transient with pulse ox ranging 85 to 96%.  His resting heart rate at bed is between 54 and 90 at night.  His current walking desaturation test is below.  He dropped only 2 points but he did get tachycardic.  His pulmonary function test show slight reduction in DLCO but otherwise okay ? ?He follows with Dr. Meda Coffee who noticed he has left lower lobe crackles.  Due to persistent symptoms he has been referred to pulmonary clinic.  He wants to get to the bottom of the issues.  His echocardiogram shows grade 1 diastolic dysfunction.  He does suffer from some visceral obesity. ? ? ?Of note he has upcoming air travel to [place] on August 15, 2020.  He is going to see his granddaughter who is not seen since the onset of the pandemic.  He believes he may not be traveling to high altitudes in [place]. ? ? ?Simple office walk 185 feet x  3 laps goal with forehead probe 08/09/2020 ?  ?O2 used ra  ?Number laps completed 3  ?  Comments about pace normal  ?Resting Pulse Ox/HR 96% and 80/min  ?Final Pulse Ox/HR 94% and 90/min  ?Desaturated </= 88% no  ?Desaturated <= 3% points no  ?Got Tachycardic >/= 90/min yes  ?Symptoms at end of test dyspnea  ?Miscellaneous comments x  ? ? ? ? ? ? ? ?PFT Results Latest Ref Rng & Units 07/12/2020  ?FVC-Pre L 3.36  ?FVC-Predicted Pre % 88  ?FVC-Post L 3.41  ?FVC-Predicted Post % 90  ?Pre FEV1/FVC % % 72  ?Post FEV1/FCV % % 74  ?FEV1-Pre L 2.41  ?FEV1-Predicted Pre % 88  ?FEV1-Post L 2.51  ?DLCO uncorrected ml/min/mmHg 16.88  ?DLCO UNC% % 73  ?DLCO corrected ml/min/mmHg 16.88  ?DLCO COR %Predicted % 73  ?DLVA Predicted % 80  ?TLC L 5.39  ?TLC %  Predicted % 83  ?RV % Predicted % 65  ? ? ?ADDENDUM: ?There is a right lower lobe nodule that is seen between vessels in ?the right lower lobe on image 97 of series 8. This nodule was ?compared to studies dating back to 2015 and shows no change. It is ?possible that this represents a lymph node along bronchovascular ?structures but again this is stable over a 6 year interval, obscured ?by surrounding vessels and bronchovascular structures. ?  ?  ?Electronically Signed ?  By: Zetta Bills M.D. ?  On: 11/27/2019 14:45 ?   ?Signed by Felipa Emory, MD on 11/27/2019  2:48 PM  ?Narrative & Impression  ?CLINICAL DATA:  Left upper lobe nodules, history of bronchiectasis ?and emphysema. ?  ?EXAM: ?CT CHEST WITHOUT CONTRAST ?  ?TECHNIQUE: ?Multidetector CT imaging of the chest was performed following the ?standard protocol without IV contrast. ?  ?COMPARISON:  Coronary CT from 08/26/2018, CT chest from 09/20/2017 ?  ?FINDINGS: ?Cardiovascular: Calcified atherosclerotic changes in the thoracic ?aorta without signs of aneurysm. Limited assessment on noncontrast ?evaluation. The heart size is normal without pericardial effusion. ?Coronary artery disease with three-vessel calcification as before. ?  ?Mediastinum/Nodes: Thoracic inlet structures are normal. No axillary ?lymphadenopathy. Small right juxta hilar lymph node, 9 mm unchanged ?since January of 2019 along with other scattered lymph nodes below ?CT criteria for pathologic enlargement. Limited assessment of hilar ?structures due to lack of contrast. Esophagus is mildly patulous ?with some gas in the lumen. ?  ?Lungs/Pleura: Small nodule between bronchovascular structures in the ?central portion of the right lower lobe (image 97, series 8) 11 x 7 ?mm, within 1 mm of its size in 2019. No dense consolidation. Mild ?basilar bronchiectasis. Subpleural reticulation as before with signs ?of centrilobular emphysema. ?  ?Tiny nodules in the left upper lobe are stable. ?   ?Upper Abdomen: Incidentally imaged portions of upper abdominal ?contents are unremarkable. No evidence of acute upper abdominal ?process. ?  ?Musculoskeletal: No chest wall mass. Sclerosis of the right ?clavicular head is unchanged since 2019. Spinal degenerative changes ?as before. ?  ?IMPRESSION: ?1. Small nodule between bronchovascular structures in the central ?portion of the right lower lobe measuring 11 x 7 mm, within 1 mm of ?its size in 2019. This nodule is within 1 mm of its size in 2019. ?This nodule is unchanged since 2015 and this therefore benign. ?2. Signs of emphysema and chronic interstitial changes with tiny ?upper lobe nodules are unchanged. ?3. Coronary artery disease with three-vessel calcification. ?  ?Aortic Atherosclerosis (ICD10-I70.0) and Emphysema (ICD10-J43.9). ?  ?Electronically Signed: ?By: Zetta Bills M.D. ?On: 11/26/2019 09:11  ? ? ? ?08/29/2020- Interim hx ?Patient  presents today to review testing and discuss starting anti-fiibrotic medication. CT imaging suggestive of probable UIP with progression since 2015, high suspicion towards IPF. He had negative serology. Dr. Chase Caller recommend starting anti-fibrotic, either Esbriet or OFEV. Accompanied by his wife during today's appointment. He is baseline today, shortness of breath and cough are the same as last visit. He was recently in [place] for 10 days, he did not need to take on hand doxycyline or prednisone. He had an overnight oximetry test on12/2/21 which showed <88% 5 hrs 35 mins, start 2L [place]. He has not received oxygen yet, he was contacted by DME company. He is interested in [employer] continuous oxygen concentrator and is willing to pay out of pocket in needed. He reports daytime oxygen saturation runs between 87-92% RA. He is active, gets an average of 8,000 steps a day. He will be changing medicare part D plans to [employer] silver come January 1st 2022.  ? ? ? ?OV 09/29/2020 ? ?Subjective:  ?Patient ID: Reginald Davis, male ,  DOB: 02-03-1946 , age 2 y.o. , MRN: [id number] , ADDRESS: [address] ?[place] [address] ?PCP [name], MD ?Patient Care Team: ?[name], MD as PCP - General (Internal Medicine) ?End, [place]

## 2021-11-28 NOTE — Patient Instructions (Signed)
IPF (idiopathic pulmonary fibrosis) ([place]) ? ?- lung fucntion improved after being on Pliant study ?- LFT nromal Feb 2023 ? ? ?Plan ? - cotniue esbriet as before ?-Do spirometry and DLCO in 3 months ?-Review consent for Daewoong oral pill Phase 2 study and email me back with any questions ?  ? ?Travel advice encounter -to [place] for 10 days in July 2023 ? ?- refill Paxlovid ?-Mask with precaution and adjusting for risk based on community prevalence and avoiding indoor clusters ?-Refill doxycycline 100 mg twice daily x10 days ?-Refill prednisone taper for 8 days as follows - Take prednisone 40 mg daily x 2 days, then '20mg'$  daily x 2 days, then '10mg'$  daily x 2 days, then '5mg'$  daily x 2 days and stop ? ? ?Dry skin ? ?-Chronic and potentially made worse by the cholesterol drug ? ?Plan ?-Try to add flaxseed powder or seeds with your meals.  Avoid flaxseed oil ? ?Follow-up ?- At 63-monthstandard of care visit with Dr. [name]30 minutes-but after spirometry and DLCO ? -Symptom questionnaire and walk test at follow-up ?

## 2021-12-19 ENCOUNTER — Encounter: Payer: Self-pay | Admitting: Cardiology

## 2021-12-19 DIAGNOSIS — E785 Hyperlipidemia, unspecified: Secondary | ICD-10-CM | POA: Diagnosis not present

## 2021-12-19 DIAGNOSIS — R7301 Impaired fasting glucose: Secondary | ICD-10-CM | POA: Diagnosis not present

## 2021-12-19 DIAGNOSIS — Z79899 Other long term (current) drug therapy: Secondary | ICD-10-CM | POA: Diagnosis not present

## 2021-12-19 DIAGNOSIS — Z125 Encounter for screening for malignant neoplasm of prostate: Secondary | ICD-10-CM | POA: Diagnosis not present

## 2021-12-19 DIAGNOSIS — M109 Gout, unspecified: Secondary | ICD-10-CM | POA: Diagnosis not present

## 2021-12-25 ENCOUNTER — Telehealth: Payer: Self-pay | Admitting: Cardiology

## 2021-12-25 NOTE — Telephone Encounter (Signed)
Returned call to patient. ?Spoke to pt about mom.  Mom is being seen by EP MD closer to where they live to discuss PPM. ?He so appreciates our help with this if needed. ? ? ?

## 2021-12-25 NOTE — Telephone Encounter (Signed)
Spoke to pt.  See today's telephone note for further documentation on this matter. ? ?

## 2021-12-25 NOTE — Telephone Encounter (Signed)
Patient returning call in regards to mychart message.  

## 2021-12-25 NOTE — Telephone Encounter (Signed)
Left message to call back  

## 2021-12-26 DIAGNOSIS — J479 Bronchiectasis, uncomplicated: Secondary | ICD-10-CM | POA: Diagnosis not present

## 2021-12-26 DIAGNOSIS — R972 Elevated prostate specific antigen [PSA]: Secondary | ICD-10-CM | POA: Diagnosis not present

## 2021-12-26 DIAGNOSIS — R202 Paresthesia of skin: Secondary | ICD-10-CM | POA: Diagnosis not present

## 2021-12-26 DIAGNOSIS — I2583 Coronary atherosclerosis due to lipid rich plaque: Secondary | ICD-10-CM | POA: Diagnosis not present

## 2021-12-26 DIAGNOSIS — R7301 Impaired fasting glucose: Secondary | ICD-10-CM | POA: Diagnosis not present

## 2021-12-26 DIAGNOSIS — Z0189 Encounter for other specified special examinations: Secondary | ICD-10-CM | POA: Diagnosis not present

## 2021-12-26 DIAGNOSIS — Z0000 Encounter for general adult medical examination without abnormal findings: Secondary | ICD-10-CM | POA: Diagnosis not present

## 2021-12-26 DIAGNOSIS — R82998 Other abnormal findings in urine: Secondary | ICD-10-CM | POA: Diagnosis not present

## 2021-12-26 DIAGNOSIS — J8410 Pulmonary fibrosis, unspecified: Secondary | ICD-10-CM | POA: Diagnosis not present

## 2021-12-26 DIAGNOSIS — M10072 Idiopathic gout, left ankle and foot: Secondary | ICD-10-CM | POA: Diagnosis not present

## 2021-12-26 DIAGNOSIS — Z1212 Encounter for screening for malignant neoplasm of rectum: Secondary | ICD-10-CM | POA: Diagnosis not present

## 2021-12-26 DIAGNOSIS — E785 Hyperlipidemia, unspecified: Secondary | ICD-10-CM | POA: Diagnosis not present

## 2021-12-26 DIAGNOSIS — J439 Emphysema, unspecified: Secondary | ICD-10-CM | POA: Diagnosis not present

## 2022-01-02 DIAGNOSIS — H35103 Retinopathy of prematurity, unspecified, bilateral: Secondary | ICD-10-CM | POA: Diagnosis not present

## 2022-01-02 DIAGNOSIS — H40051 Ocular hypertension, right eye: Secondary | ICD-10-CM | POA: Diagnosis not present

## 2022-01-02 DIAGNOSIS — H35372 Puckering of macula, left eye: Secondary | ICD-10-CM | POA: Diagnosis not present

## 2022-01-02 DIAGNOSIS — H33312 Horseshoe tear of retina without detachment, left eye: Secondary | ICD-10-CM | POA: Diagnosis not present

## 2022-01-05 ENCOUNTER — Ambulatory Visit: Payer: Medicare Other | Attending: Internal Medicine

## 2022-01-05 ENCOUNTER — Other Ambulatory Visit (HOSPITAL_BASED_OUTPATIENT_CLINIC_OR_DEPARTMENT_OTHER): Payer: Self-pay

## 2022-01-05 DIAGNOSIS — Z23 Encounter for immunization: Secondary | ICD-10-CM

## 2022-01-05 MED ORDER — MODERNA COVID-19 BIVAL BOOSTER 50 MCG/0.5ML IM SUSP
INTRAMUSCULAR | 0 refills | Status: DC
  Filled 2022-01-05: qty 0.5, 1d supply, fill #0

## 2022-01-05 NOTE — Progress Notes (Signed)
? ?  Covid-19 Vaccination Clinic ? ?Name:  Reginald Davis    ?MRN: [id number] ?DOB: November 25, 1945 ? ?01/05/2022 ? ?Reginald Davis was observed post Covid-19 immunization for 15 minutes without incident. He was provided with Vaccine Information Sheet and instruction to access the V-Safe system.  ? ?Reginald Davis was instructed to call 911 with any severe reactions post vaccine: ?Difficulty breathing  ?Swelling of face and throat  ?A fast heartbeat  ?A bad rash all over body  ?Dizziness and weakness  ? ?Immunizations Administered   ? ? Name Date Dose VIS Date Route  ? Moderna Covid-19 vaccine Bivalent Booster 01/05/2022 11:11 AM 0.5 mL 04/22/2021 Intramuscular  ? Manufacturer: Moderna  ? Lot: [id number]  ? [place]: [id number]  ? ?  ? ?

## 2022-01-10 DIAGNOSIS — L218 Other seborrheic dermatitis: Secondary | ICD-10-CM | POA: Diagnosis not present

## 2022-01-10 DIAGNOSIS — L812 Freckles: Secondary | ICD-10-CM | POA: Diagnosis not present

## 2022-01-10 DIAGNOSIS — D2272 Melanocytic nevi of left lower limb, including hip: Secondary | ICD-10-CM | POA: Diagnosis not present

## 2022-01-10 DIAGNOSIS — D2271 Melanocytic nevi of right lower limb, including hip: Secondary | ICD-10-CM | POA: Diagnosis not present

## 2022-01-10 DIAGNOSIS — D1801 Hemangioma of skin and subcutaneous tissue: Secondary | ICD-10-CM | POA: Diagnosis not present

## 2022-01-10 DIAGNOSIS — D225 Melanocytic nevi of trunk: Secondary | ICD-10-CM | POA: Diagnosis not present

## 2022-01-10 DIAGNOSIS — L821 Other seborrheic keratosis: Secondary | ICD-10-CM | POA: Diagnosis not present

## 2022-01-10 DIAGNOSIS — L84 Corns and callosities: Secondary | ICD-10-CM | POA: Diagnosis not present

## 2022-01-15 ENCOUNTER — Ambulatory Visit (INDEPENDENT_AMBULATORY_CARE_PROVIDER_SITE_OTHER): Payer: Medicare Other

## 2022-01-15 DIAGNOSIS — I442 Atrioventricular block, complete: Secondary | ICD-10-CM

## 2022-01-15 LAB — CUP PACEART REMOTE DEVICE CHECK
Battery Remaining Longevity: 125 mo
Battery Remaining Percentage: 95 %
Battery Voltage: 3.01 V
Brady Statistic AP VP Percent: 1 %
Brady Statistic AP VS Percent: 1 %
Brady Statistic AS VP Percent: 41 %
Brady Statistic AS VS Percent: 58 %
Brady Statistic RA Percent Paced: 1 %
Brady Statistic RV Percent Paced: 42 %
Date Time Interrogation Session: 20230508020019
Implantable Lead Implant Date: 20220427
Implantable Lead Implant Date: 20220427
Implantable Lead Location: 753859
Implantable Lead Location: 753860
Implantable Pulse Generator Implant Date: 20220427
Lead Channel Impedance Value: 440 Ohm
Lead Channel Impedance Value: 460 Ohm
Lead Channel Pacing Threshold Amplitude: 0.75 V
Lead Channel Pacing Threshold Amplitude: 0.875 V
Lead Channel Pacing Threshold Pulse Width: 0.5 ms
Lead Channel Pacing Threshold Pulse Width: 0.5 ms
Lead Channel Sensing Intrinsic Amplitude: 3.6 mV
Lead Channel Sensing Intrinsic Amplitude: 7.6 mV
Lead Channel Setting Pacing Amplitude: 1.125
Lead Channel Setting Pacing Amplitude: 2 V
Lead Channel Setting Pacing Pulse Width: 0.5 ms
Lead Channel Setting Sensing Sensitivity: 2 mV
Pulse Gen Model: 2272
Pulse Gen Serial Number: 3920271

## 2022-01-16 ENCOUNTER — Encounter: Payer: Self-pay | Admitting: Cardiology

## 2022-02-02 ENCOUNTER — Other Ambulatory Visit: Payer: Self-pay | Admitting: Pharmacist

## 2022-02-02 DIAGNOSIS — J84112 Idiopathic pulmonary fibrosis: Secondary | ICD-10-CM

## 2022-02-02 MED ORDER — PIRFENIDONE 801 MG PO TABS: 1.0000 | ORAL_TABLET | Freq: Three times a day (TID) | ORAL | 0 refills | Status: DC

## 2022-02-02 NOTE — Telephone Encounter (Signed)
Refill sent for ESBRIET to [facility] for Esbriet: [phone]  Dose: '801mg'$  three times daily  Last OV: 11/28/21 Provider: Dr. Chase Caller  Next OV: 03/02/22  CMET on 11/01/21 wnl  Knox Saliva, PharmD, MPH, BCPS Clinical Pharmacist (Rheumatology and Pulmonology)

## 2022-02-02 NOTE — Progress Notes (Signed)
Remote pacemaker transmission.   

## 2022-02-20 ENCOUNTER — Ambulatory Visit (INDEPENDENT_AMBULATORY_CARE_PROVIDER_SITE_OTHER): Payer: Medicare Other | Admitting: Cardiology

## 2022-02-20 ENCOUNTER — Encounter: Payer: Self-pay | Admitting: Cardiology

## 2022-02-20 VITALS — BP 136/74 | HR 75 | Ht 66.0 in | Wt 164.0 lb

## 2022-02-20 DIAGNOSIS — I2510 Atherosclerotic heart disease of native coronary artery without angina pectoris: Secondary | ICD-10-CM

## 2022-02-20 DIAGNOSIS — I442 Atrioventricular block, complete: Secondary | ICD-10-CM

## 2022-02-20 NOTE — Patient Instructions (Signed)
Medication Instructions:  Your physician recommends that you continue on your current medications as directed. Please refer to the Current Medication list given to you today.  *If you need a refill on your cardiac medications before your next appointment, please call your pharmacy*   Lab Work: None ordered   Testing/Procedures: None ordered   Follow-Up: At [facility], you and your health needs are our priority.  As part of our continuing mission to provide you with exceptional heart care, we have created designated Provider Care Teams.  These Care Teams include your primary Cardiologist (physician) and Advanced Practice Providers (APPs -  Physician Assistants and Nurse Practitioners) who all work together to provide you with the care you need, when you need it.  Remote monitoring is used to monitor your Pacemaker or ICD from home. This monitoring reduces the number of office visits required to check your device to one time per year. It allows [place] to keep an eye on the functioning of your device to ensure it is working properly. You are scheduled for a device check from home on 04/16/2022. You may send your transmission at any time that day. If you have a wireless device, the transmission will be sent automatically. After your physician reviews your transmission, you will receive a postcard with your next transmission date.  Your next appointment:   1 year(s)  The format for your next appointment:   In Person  Provider:   Allegra Lai, MD    Thank you for choosing [facility]!!   Trinidad Curet, RN [phone]    Other Instructions   Important Information About Sugar
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
## 2022-02-23 DIAGNOSIS — H18421 Band keratopathy, right eye: Secondary | ICD-10-CM | POA: Diagnosis not present

## 2022-02-23 DIAGNOSIS — Z961 Presence of intraocular lens: Secondary | ICD-10-CM | POA: Diagnosis not present

## 2022-02-23 DIAGNOSIS — H40051 Ocular hypertension, right eye: Secondary | ICD-10-CM | POA: Diagnosis not present

## 2022-02-23 DIAGNOSIS — H35372 Puckering of macula, left eye: Secondary | ICD-10-CM | POA: Diagnosis not present

## 2022-03-01 NOTE — Patient Instructions (Signed)
IPF (idiopathic pulmonary fibrosis) ([employer])  - lung fucntion improved after being on Pliant study - LFT nromal Feb 2023   Plan  - cotniue esbriet as before -Do spirometry and DLCO in 3 months -Review consent for [employer] oral pill Phase 2 study and email me back with any questions    Travel advice encounter -to [place] for 10 days in July 2023  - refill Paxlovid -Mask with precaution and adjusting for risk based on community prevalence and avoiding indoor clusters -Refill doxycycline 100 mg twice daily x10 days -Refill prednisone taper for 8 days as follows - Take prednisone 40 mg daily x 2 days, then '20mg'$  daily x 2 days, then '10mg'$  daily x 2 days, then '5mg'$  daily x 2 days and stop   Dry skin  -Chronic and potentially made worse by the cholesterol drug  Plan -Try to add flaxseed powder or seeds with your meals.  Avoid flaxseed oil  Follow-up - At 33-monthstandard of care visit with Dr. [name]30 minutes-but after spirometry and DLCO  -Symptom questionnaire and walk test at follow-up

## 2022-03-01 NOTE — Progress Notes (Signed)
OV 08/09/2020  Subjective:  Patient ID: Reginald Davis, male , DOB: 01-10-1946 , age 76 y.o. , MRN: [id number] , ADDRESS: [address] [place] [address] PCP Rodrigo Ran, MD Patient Care Team: Rodrigo Ran, MD as PCP - General (Internal Medicine) End, Cristal Deer, MD as PCP - Cardiology (Cardiology) Cardiologist is Dr. Andreas Ohm This Provider for this visit: Treatment Team:  Attending Provider: Kalman Shan, MD    08/09/2020 -   Chief Complaint  Patient presents with   Consult    COPD, DOE     HPI Reginald Davis 75 y.o. 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  History is given by the patient who also brought in very clearly typed notes about his history.  He says in the 1970s and 1980s while he was busy with his professional life he was a smoker and finally quit smoking in 1997.  His total pack smoking history might be 70 years.  He undergoes regular screening CT scans of the chest.  Most recently March 2021 that reported signs of emphysema and a small upper lobe nodule unchanged over many years and possible interstitial lung disease changes.  He says he was at baseline and doing well but in April 2021 he started noticing shortness of breath.  He was placed on Spiriva which improved his symptoms daily.  After that he felt like significantly improved.  He then was dealing with significant difficult to control blood pressures with Dr. Delton See.  She has increased his dosage of the blood pressure medication.  He was remaining active walking approximately thousand steps daily and 30 minutes of walking 5 more days per week.  Then in September 2020 when he and his wife traveled to [place] and then drove to [place] which is an elevation of 4000-4500 feet.  After that they continue to [facility] at 6500-7500 feet.  They got very short of breath.  Even had a fever episode.  His pulse oximetry dropped to 84-85.  He ended up in the emergency room and  was given a diagnosis of COPD exacerbation.  Given doxycycline and prednisone and then he improved.  Post improvement in September and post return to [facility] he has had difficult to control blood pressure but he does have some residual shortness of breath with exertion relieved by rest.  His apple watch has noticed desaturations at night that are transient with pulse ox ranging 85 to 96%.  His resting heart rate at bed is between 54 and 90 at night.  His current walking desaturation test is below.  He dropped only 2 points but he did get tachycardic.  His pulmonary function test show slight reduction in DLCO but otherwise okay  He follows with Dr. Delton See who noticed he has left lower lobe crackles.  Due to persistent symptoms he has been referred to pulmonary clinic.  He wants to get to the bottom of the issues.  His echocardiogram shows grade 1 diastolic dysfunction.  He does suffer from some visceral obesity.   Of note he has upcoming air travel to [place] on August 15, 2020.  He is going to see his granddaughter who is not seen since the onset of the pandemic.  He believes he may not be traveling to high altitudes in [place].   Simple office walk 185 feet x  3 laps goal with forehead probe 08/09/2020   O2 used ra  Number laps completed 3  Comments  about pace normal  Resting Pulse Ox/HR 96% and 80/min  Final Pulse Ox/HR 94% and 90/min  Desaturated </= 88% no  Desaturated <= 3% points no  Got Tachycardic >/= 90/min yes  Symptoms at end of test dyspnea  Miscellaneous comments x         PFT Results Latest Ref Rng & Units 07/12/2020  FVC-Pre L 3.36  FVC-Predicted Pre % 88  FVC-Post L 3.41  FVC-Predicted Post % 90  Pre FEV1/FVC % % 72  Post FEV1/FCV % % 74  FEV1-Pre L 2.41  FEV1-Predicted Pre % 88  FEV1-Post L 2.51  DLCO uncorrected ml/min/mmHg 16.88  DLCO UNC% % 73  DLCO corrected ml/min/mmHg 16.88  DLCO COR %Predicted % 73  DLVA Predicted % 80  TLC L 5.39  TLC %  Predicted % 83  RV % Predicted % 65    ADDENDUM: There is a right lower lobe nodule that is seen between vessels in the right lower lobe on image 97 of series 8. This nodule was compared to studies dating back to 2015 and shows no change. It is possible that this represents a lymph node along bronchovascular structures but again this is stable over a 6 year interval, obscured by surrounding vessels and bronchovascular structures.     Electronically Signed   By: Donzetta Kohut M.D.   On: 11/27/2019 14:45    Signed by Jule Economy, MD on 11/27/2019  2:48 PM  Narrative & Impression  CLINICAL DATA:  Left upper lobe nodules, history of bronchiectasis and emphysema.   EXAM: CT CHEST WITHOUT CONTRAST   TECHNIQUE: Multidetector CT imaging of the chest was performed following the standard protocol without IV contrast.   COMPARISON:  Coronary CT from 08/26/2018, CT chest from 09/20/2017   FINDINGS: Cardiovascular: Calcified atherosclerotic changes in the thoracic aorta without signs of aneurysm. Limited assessment on noncontrast evaluation. The heart size is normal without pericardial effusion. Coronary artery disease with three-vessel calcification as before.   Mediastinum/Nodes: Thoracic inlet structures are normal. No axillary lymphadenopathy. Small right juxta hilar lymph node, 9 mm unchanged since January of 2019 along with other scattered lymph nodes below CT criteria for pathologic enlargement. Limited assessment of hilar structures due to lack of contrast. Esophagus is mildly patulous with some gas in the lumen.   Lungs/Pleura: Small nodule between bronchovascular structures in the central portion of the right lower lobe (image 97, series 8) 11 x 7 mm, within 1 mm of its size in 2019. No dense consolidation. Mild basilar bronchiectasis. Subpleural reticulation as before with signs of centrilobular emphysema.   Tiny nodules in the left upper lobe are stable.    Upper Abdomen: Incidentally imaged portions of upper abdominal contents are unremarkable. No evidence of acute upper abdominal process.   Musculoskeletal: No chest wall mass. Sclerosis of the right clavicular head is unchanged since 2019. Spinal degenerative changes as before.   IMPRESSION: 1. Small nodule between bronchovascular structures in the central portion of the right lower lobe measuring 11 x 7 mm, within 1 mm of its size in 2019. This nodule is within 1 mm of its size in 2019. This nodule is unchanged since 2015 and this therefore benign. 2. Signs of emphysema and chronic interstitial changes with tiny upper lobe nodules are unchanged. 3. Coronary artery disease with three-vessel calcification.   Aortic Atherosclerosis (ICD10-I70.0) and Emphysema (ICD10-J43.9).   Electronically Signed: By: Donzetta Kohut M.D. On: 11/26/2019 09:11     08/29/2020- Interim hx Patient presents  today to review testing and discuss starting anti-fiibrotic medication. CT imaging suggestive of probable UIP with progression since 2015, high suspicion towards IPF. He had negative serology. Dr. Marchelle Gearing recommend starting anti-fibrotic, either Esbriet or OFEV. Accompanied by his wife during today's appointment. He is baseline today, shortness of breath and cough are the same as last visit. He was recently in [place] for 10 days, he did not need to take on hand doxycyline or prednisone. He had an overnight oximetry test on12/2/21 which showed <88% 5 hrs 35 mins, start 2L . He has not received oxygen yet, he was contacted by DME company. He is interested in [employer] continuous oxygen concentrator and is willing to pay out of pocket in needed. He reports daytime oxygen saturation runs between 87-92% RA. He is active, gets an average of 8,000 steps a day. He will be changing medicare part D plans to [employer] silver come January 1st 2022.     OV 09/29/2020  Subjective:  Patient ID: Reginald Davis, male ,  DOB: [birth year] , age 33 y.o. , MRN: [id number] , ADDRESS: [address] [place] [address] PCP Rodrigo Ran, MD Patient Care Team: Rodrigo Ran, MD as PCP - General (Internal Medicine) End, Cristal Deer, MD as PCP - Cardiology (Cardiology)  This Provider for this visit: Treatment Team:  Attending Provider: Kalman Shan, MD    09/29/2020 -   Chief Complaint  Patient presents with   Follow-up    6 min walk performed 09/22/20.  Pt states he feels like he has been stable since last visit. Pt is wearing 2L at night.  Pt states if he is on an incline, his sats are dropping some below 88% on room air.    IPF dx givien 08/26/20 based on "probable UIP on CT + negative srology, +  age > 21, _ white + male + prior smoker + heart burn/GERD hx-> this is "IPF"  Associated mild emphysema present -started Spiriva  GERD hx  Pirfenidone start this pending January 2022/February 2022  HPI HAINES MORALES 76 y.o. -returns for follow-up.  He presents with his wife.  Since his last visit in the interim he saw nurse practitioner.  We gave him a diagnosis of IPF based on probable UIP on CT scan age greater than 88, Caucasian ethnicity, male gender, prior smoker, acid reflux history and negative serology.  He has accepted the diagnosis.  He presents with his wife at this point in time.  They have gone through pirfenidone education and started paperwork.  He is waiting for co-pay approval.  At this point in time he feels stable his symptom score is below.  He is completed a 6-minute walk test but he did not desaturate below 88%.  The focus of this visit is multiple questions in general education and answering questions about idiopathic pulmonary fibrosis  -Overall his cough is improved.  He attributes his cough improvement to omeprazole.  However he wants to switch to pantoprazole.  He did some reading and he said that he is concerned about theoretical effects of omeprazole and pirfenidone plasma levels  therefore he wants to switch to pantoprazole.  I have supported this and will do the prescription.  -He is very interested in clinical trials as a care option.  We discussed concept of clinical trials being voluntary and what care option meant.  He is probably leaning towards later phase trials.  We gave him consent form for PROMEDIOR Phase 3 and Pliant phase 2 studies but  did indicate to him that there is currently a backlog and in addition he will have to be stable on antifibrotic's for a few months before he can enroll.  He is understanding of this.  -He is interested in the patient support group and I have given him the email for a local support group to the pulmonary fibrosis foundation  -He exercises regularly.  However he is interested in joining pulmonary rehabilitation a referral has been made but he is still waiting to hear from them.  I have emailed the director to get a follow-up.  -We discussed the natural history of pulmonary fibrosis.  Especially IPF.  We discussed its progressive disease.  He and his wife wanted to know about prognostic markers.  Currently these are not commercially available.  In particular is interested in Kale-6.  Indicated that in the next few years, she will test might be available to differentiate prognosis but at this time we would follow clinically.  Currently based on current severity and stability predicts future progression.  Also explained viral illnesses can exacerbate diseases  - Lung transplantation: He is interested in this concept.  He wants early referral to [facility] so he gets his options covered.  He knows he has to lose weight.  We discussed common barriers for lung transplantation including psychological Support, social support, financial costs.  He says he does not have any of this.  His BMI is 29 he needs to get to less than 27.  Explained to him nutrition is the way to go on this.  He is also going to do rehabilitation   - We also  discussed his emphysema component being small.  He will continue with Spiriva  = Oxygenation -he is using nocturnal oxygen.  His apple watch shows his oxygen levels to be normal now at night.  He is monitoring this.  He says that when he exerts heavily his oxygen does drop but on the 6-minute walk test he did not drop.  He will pay for a portable oxygen system on his own out-of-pocket.  Therefore we will do an order for this.      CT Chest data  No results found.    OV 11/10/2020  Subjective:  Patient ID: Reginald Davis, male , DOB: 1945/12/30 , age 78 y.o. , MRN: [id number] , ADDRESS: [address] [place] [address] PCP Rodrigo Ran, MD Patient Care Team: Rodrigo Ran, MD as PCP - General (Internal Medicine) End, Cristal Deer, MD as PCP - Cardiology (Cardiology)  This Provider for this visit: Treatment Team:  Attending Provider: Kalman Shan, MD    11/10/2020 -   Chief Complaint  Patient presents with   Follow-up    Doing well    IPF dx givien 08/26/20 based on "probable UIP on CT + negative srology, +  age > 74, _ white + male + prior smoker + heart burn/GERD hx-> this is "IPF"  -Last CT scan of the chest December 2021  - Esbriet start date -> Oct 26, 2020   - Pulm rehab Mar 2022   Associated mild emphysema present -started Spiriva    HPI AARN BLOCKER 76 y.o. -presents with his wife. This is for IPF follow-up. He finally got hold of his pirfenidone. He started this 10/26/2020. So far he is tolerating it well. Earlier this week on 11/08/2020 he started pulmonary rehabilitation. He feels stable. Wife feels he is an under perceiver. On a 6-minute walk test he dropped to as  low as 89%. He had a pulmonary function test at [facility] that showed decline in DLCO. He is worried about this. We walked him again today and his pulse ox did drop to 88%. That could have been a variation in place but clearly this a difference suggesting progression. However he is tolerating  his pirfenidone fine. No side effects as evidenced below in the symptom score. He really wants to start ASAP on clinical trials. He is already met with [facility] for transplant as an option. He had to consent forms one for phase 3 IV infusion PRomedior and another for Phase 2 Pliant Part C. The latter has moved to Part D which is higher dose. He had more questions about this. We went over several components of clinical trials documented below. Explained to him about therapeutic misconception and secondary intent being therapeutic gain. Advised him that the primary intent should always be about drug development and contribution to science. He understood this. Explained to him currently there is supply chain issues with a phase 3 study. However the phase 2 sutdy is active. However he were to wait a few months before he is eligible for either study because he just started on pirfenidone. He understands this. We have given him an updated consent form. PFT [facility] fe[facility]22 FVC 3.55L DLCO 11.02    Telephone call 11/30/20   Nov 2021 -> March 2022 with [place]  - FVC up - dlco down  Compared to [facility] feb 2022  -March 2022 with  [place]  - FVC down  - DLCO up   Overall  - would stay stable  - there is mixed signal - nothing to do for now  - allow anti fibrotic kick in  - be patient   OV 12/21/2020  Subjective:  Patient ID: Reginald Davis, male , DOB: 1946-02-21 , age 3 y.o. , MRN: [id number] , ADDRESS: [address] [place] [address] PCP Rodrigo Ran, MD Patient Care Team: Rodrigo Ran, MD as PCP - General (Internal Medicine) End, Cristal Deer, MD as PCP - Cardiology (Cardiology)  This Provider for this visit: Treatment Team:  Attending Provider: Kalman Shan, MD    12/21/2020 -   Chief Complaint  Patient presents with   Follow-up    PFT performed 11/25/20.  Pt states he has been doing okay and denies any complaints.     IPF dx givien 08/26/20 based on "probable UIP on CT +  negative srology, +  age > 16, + white + male + prior smoker + heart burn/GERD hx-> this is "IPF"  -Last CT scan of the chest December 2021  - Esbriet start date -> Oct 26, 2020   - Pulm rehab Mar 2022  - Clinical trial education - Mar 2022 [facility] Billet D ICF given)  - PFF concept introduction - May 2022   Associated mild emphysema present -started Spiriva  Normal Echo May 2021  HPI Reginald Davis 76 y.o. -returns for follow-up with his wife.  He is now on pirfenidone since mid 01/27/2021 and is tolerating it well.  He reports intentional weight loss.  He has some mild GI side effects that are reflected in the symptom score and this is stable.  He says with a combination of intentional weight loss and rehabilitation and oxygen use [with exertion] his resting heart rate is now in the 60s.  He showed me his apple watch curve about that.  He is pretty excited about that.  He has had [place]  for short of Covid vaccine mRNA.  He is really interested in clinical trials.  He has met with the research coordinator and is scheduled himself for Pliant study visit mid May 2022.  He has read the consent.  He is keeping up with the literature on pulmonary fibrosis.  He is pretty excited about an upcoming IND application approval for the pharmaceutical company [employer] for [employer] BUI [id number].  We will be a study site for this but probably in the fall or the winter 2022.  I told him I do not know much details about this drug as yet.  He says he will start the Pliant study currently.  He wants to know about travel.  He has some prednisone and hand and also antibiotics.  We will go to [place] and [place].  He will take his portable oxygen system with him.  I have approved this travel.  At room air at rest he is fine.  When he exerts himself with treadmill and exercise he uses 2-3 L pulse.  Most recent liver function test was normal.  This was in December 12, 2020.  He is worried about his progression.  [facility]icular [facility] test showed a drop.  He then had pulmonary function test with [place].  I put these results together.  It appears that there is lot of fluctuation particularly with the [facility] test.  But on average it appears his DLCO is declined since he has been with [place] but his FVC is stable.   He is enjoying his rehabilitation.    Results for ELWIN, HOLLANDS (MRN [id number]) as of 12/21/2020 11:06  Ref. Range 07/12/2020 11:53 10/24/2020 [facility] 11/25/2020 08:52  FVC-Pre Latest Units: L 3.36 3.55 3.43  FVC-%Pred-Pre Latest Units: % 88 100% 94   Results for SOANE, WILK (MRN [id number]) as of 12/21/2020 11:06  Ref. Range 07/12/2020 11:53 10/24/2020 [facility] 11/25/2020 08:52  DLCO cor Latest Units: ml/min/mmHg 16.88 11.02 13.45  DLCO cor % pred Latest Units: % 73 49.3 60   PFT   Subjective:  Patient ID: Reginald Davis, male , DOB: [birth year] , age 76 y.o. , MRN: [id number] , ADDRESS: [address] [place] [address] PCP Rodrigo Ran, MD Patient Care Team: Rodrigo Ran, MD as PCP - General (Internal Medicine) Regan Lemming, MD as PCP - Electrophysiology (Cardiology) Chilton Si, MD as PCP - Cardiology (Cardiology)  This Provider for this visit: Treatment Team:  Attending Provider: Kalman Shan, MD    08/22/2021 -   Chief Complaint  Patient presents with   Follow-up    Pt states he has had diarrhea for the past 6 weeks and is unsure if it is due to his medication.     IPF dx givien 08/26/20 based on "probable UIP on CT + negative srology, +  age > 69, + white + male + prior smoker + heart burn/GERD hx-> this is "IPF"  -Last CT scan of the chest December 2021  - Esbriet start date -> Oct 26, 2020   - Pulm rehab Mar 2022  - Clinical trial education - Mar 2022 [facility] ICF given)     - Pliant study participant Tyson Alias 02Jun2022 and was randomized 22Jun2022  - PFF concept introduction - May 2022   Associated mild emphysema present -started Spiriva  Normal Echo May  2021  HPI Reginald Davis 76 y.o. -   IPF: [facility] visit for OPF. On esbrit and also study (IMP v  placebo). Has study  visit#10 in 19Dec, then come back for EOT on 30Jan and EOS on 14Feb and then he is done.  From a respiratory standpoint his symptom scores are slowly actually improved.  His FVC also shows slow improvement over time.  In August 2022 he had a walk test at [facility] and this shows improvement in walking distance.  His weight is stable.  He had recent high-resolution CT chest.  Compared to September 2022 his ILD is stable but compared to remote past there is progression.  Overall he is pleased with the symptoms and stability.  He uses oxygen 2 L at night and 3 L pulsed with exercise and while traveling.  He continues on pirfenidone and study drug.  He should finish the study next month or so.  Rash: Sometime around September 2022 or so he developed punctate rash in his forearms and his back.  He says this still persist but it is stable its not any worse.  He is not overly concerned about this  Main new issue is that since the last 6 weeks he has had diarrhea.  Started as mild but since then is gotten progressive.  He says currently it is severe.  He is having a lot of flatulence and when he has significant amount of flatulence he is having some amount of stool in his underwear.  For the last 3 weeks he is taking align probiotic.  For the last 1 week he is also taking a protein shake but he says there is no sweetener in it.  For the last 1 week he is started Imodium.  After this diarrhea is better.  He still having diarrhea 3-4 times a day.  He is taking Imodium 3-4 times a day.  The stool is definitely more formed but it still worrisome for him.  He says he is cut down on nuts and fruits.  He denies any artificial sweetener.  Did medication review for diarrhea causing drugs: The only new introduction is a study drug and prior to that was the pirfenidone.  However he is on other drugs  chronically that can cause diarrhea and this includes colchicine greater than 20%, Diovan greater than 5% and PPI.  Initially did not complain about pain but when I asked him he said sometimes he has some right lower quadrant discomfort.  There is no fever or bloody stools.  He had recent safety labs on 08/15/2021.  I personally reviewed this and this is normal.     Upcoming travel: He is plan to go to [place] in January 2023.  This is for 1-2 weeks and is to visit his daughter and his only grandchild.  He wants his diarrhea significantly addressed before the trip.  CT Chest data  No results found.    OV 11/28/2021  Subjective:  Patient ID: Reginald Davis, male , DOB: 29-Jan-1946 , age 15 y.o. , MRN: [id number] , ADDRESS: [address] [place] [address] PCP Rodrigo Ran, MD Patient Care Team: Rodrigo Ran, MD as PCP - General (Internal Medicine) Regan Lemming, MD as PCP - Electrophysiology (Cardiology) Chilton Si, MD as PCP - Cardiology (Cardiology)  This Provider for this visit: Treatment Team:  Attending Provider: Kalman Shan, MD    11/28/2021 -   Chief Complaint  Patient presents with   Follow-up    Pt states he has been doing okay since last visit.   HPI RAJI HEITNER 76 y.o. -returns for follow-up.  Presents  with his wife.  He tells me that he is doing well overall.  After coming off the pliant study in February 2023 for 2 weeks he thought he had increased nocturnal desaturations but then this settled out and he is better.  He is intentionally lost weight.  He did go for a lung transplant evaluation at [facility] 11/01/2021.  His 6-minute walking test is improved.  His pulmonary function test also has improved [see below].  He attributes this improvement to being on the research study protocol.  We do not know if he was randomized to placebo or the actual medicine of product.  He believes he was randomized to the actual medicine product.  He did  have some increased diarrhea during the study and he says this is now improved.  He is interested in more studies.  Although the current improvement in diarrhea is also because he is taking Imodium on a scheduled basis.  He continues to use 2 L at night and 3 L with exercise although 6-minute walk test had an improved distance and did not desaturate below 91% and he finished 15 laps at [facility].  He is interested in more clinical trials.  We we discussed a phase 2 oral anticollagen inhibitor study by [employer] sponsor.  He has taken the consent to review.  He is also interested in the future injection study that he came across in the Internet.  It is sponsored by [employer].  Were not a location for the study at this point in time but did indicate to him that we are in communication and do not have much information to share with him about that.  He is going to read the Internet and let us know what study he wants to do.  He continues to have dry skin.  We talked about flaxseed  He has tended travel to [place] in July 2023.  He wants [place] Paxlovid refilled.  He also wants doxycycline and prednisone refill for the trip.  I agreed to do that.    Subjective:  Patient ID: Reginald Davis, male , DOB: 02/23/1946 , age 49 y.o. , MRN: [id number] , ADDRESS: [address] [place] [address] PCP Rodrigo Ran, MD Patient Care Team: Rodrigo Ran, MD as PCP - General (Internal Medicine) Regan Lemming, MD as PCP - Electrophysiology (Cardiology) Chilton Si, MD as PCP - Cardiology (Cardiology)  This Provider for this visit: Treatment Team:  Attending Provider: Kalman Shan, MD    03/02/2022 -   Chief Complaint  Patient presents with   Follow-up    Pt here for f/u on ILD, states breathing has been good some SOB increased coughing.       IPF dx givien 08/26/20 based on "probable UIP on CT + negative srology, +  age > 99, + white + male + prior smoker + heart burn/GERD hx-> this is  "IPF"  -Last CT scan of the chest December 2022  - Esbriet start date -> Oct 26, 2020   - Pulm rehab Mar 2022  - Clinical trial education - Mar 2022 (Pliant D ICF given)     - Pliant study participant Tyson Alias 02Jun2022 and was randomized 22Jun2022 -0> completed early (feb) 2023  - PFF concept introduction - May 2022   Associated mild emphysema present -started Spiriva  Normal Echo May 2021  HPI Reginald Davis 76 y.o. -returns for follow-up.  He has completed his pliant research protocol in February 2023.  He continues  his as..  He uses 2 L nasal cannula at night.  He uses oxygen with exertion.  Symptom score shows stable.  Pulmonary function test is stable.  However he feels that at night his Apple Watch on 2 L nasal cannula showing more desaturations than usual.  We did discuss about getting this formally tested with a overnight pulse oximetry to see if he would need more oxygen.  He is agreeable to this plan.  I did indicate to him that given stability in symptoms and pulmonary function test and the walking desaturation test  -pretest probably due for him desaturating at night more than usual would be low.  Is also willing to get echocardiogram and a BNP check.  Depending on this we might have to consider right heart catheterization if for WHO group 3 pulmonary hypertension is in the differential diagnosis.  But overall I think the probably of pulmonary hypertension is also low given the stability.   He continues to be interested in research protocols.  He is particularly interested in a study called  MOONSCAPE -which involves the STAT3 pathway against IPF and it would involve a subcutaneous injection.  He is reading a consent form.  He is willing to participate if he passes on prescreen.    He also believes that his acid reflux is slightly more active.  He is taking his PPI.  He is doing all the other measures.  But did notice that he is taking fish oil.  He takes this for his hyperlipidemia.   I did indicate to him side effect of fish oil as acid reflux.  He is now on Praluent for his hyperlipidemia which is working really well.  Therefore he is willing to stop the fish oil.   In terms of dry skin: He takes some flaxseed he is putting a new lotion and this is better.  In terms of travel he is going to go to [place] again to see his grandchild.  This will be in July 2023.  In terms of his complete heart block: He is on pacer but he believes the pacer is more active now.  SYMPTOM SCALE - ILD 09/29/2020  11/10/2020 Now on esbriet sinc 10/26/20 12/21/2020 esbriet + 171# 08/22/2021 Esbriet and Pliant study drug , 173# 11/28/2021 Esbriet, off plaint study, 2L [place] night, 3L [place] exercise, 163# intential weight loss 03/02/2022 162# night o2 , ex o2 subjectively  O2 use ra  ra a t rest 2-3L with exercise pulsed 2L  at night, 3L pulsed during exerise and travels    Shortness of Breath 0 -> 5 scale with 5 being worst (score 6 If unable to do)       At rest 0.5 0 0.5 1 1 1   Simple tasks - showers, clothes change, eating, shaving 1 00 0 0 1 1  Household (dishes, doing bed, laundry) 1 0 0.5 1 2  1.5  Shopping 1 0 0.5 1 2  1.5  Walking level at own pace 1.5 1 1  0 2 2  Walking up Stairs *2.25 2 2 2 3 3   Total (30-36) Dyspnea Score 7.25 3 6 5 11 10   How bad is your cough? Improved with ppi 1 1 ` 1 1  How bad is your fatigue 0 0 0.5 ` 1 1  How bad is nausea 0 0 0 0 0 0  How bad is vomiting?  0 00 0 0 0 0  How bad is diarrhea? 0.5 coffee 0 0.25 1 1  0  How bad is anxiety? 0.5 0 0.5 1 0 0  How bad is depression 0 0 0 0 0 0   Walk test Simple office walk 185 feet x  3 laps goal with forehead probe 08/09/2020  [facility] - [facility] 89% and HR111. 1296 fet. 0L o2 needed. Pl JR 111/ done 10/24/20  08/22/2021 - at [facility] 05/01/21 - walked 1671 feet wihout o2. This is an imprement  03/02/2022  163# -11/01/2021 [facility] the 6-minute walk and completed 174 6 feet.  Lowest pulse ox was  91%.  Heart rate was 107.  Did 15 labs.  Did not need oxygen.  O2 used ra ra ra ra  Number laps completed 3 3 3 3   Comments about pace normal nL    Resting Pulse Ox/HR 96% and 80/min 96% and 90.min 99% and 60 97% and HR 62  Final Pulse Ox/HR 94% and 90/min 88% and 97/min 95% and 94 94% and HR 84  Desaturated </= 88% no yes yno no  Desaturated <= 3% points no Yes, 8 points Yes, 4 Yes, 3 points  Got Tachycardic >/= 90/min yes yes yes no  Symptoms at end of test dyspnea Some dyspnea Mild dyspea none  Miscellaneous comments x   Bris pad      OV 03/01/2022 CT Chest data  No results found.    PFT     Latest Ref Rng & Units 03/02/2022    8:49 AM 11/25/2020    8:52 AM 07/12/2020   11:53 AM  PFT Results  FVC-Pre L 3.89  P 3.43  3.36   FVC-Predicted Pre % 108  P 94  88   FVC-Post L   3.41   FVC-Predicted Post %   90   Pre FEV1/FVC % % 71  P 74  72   Post FEV1/FCV % %   74   FEV1-Pre L 2.75  P 2.53  2.41   FEV1-Predicted Pre % 106  P 97  88   FEV1-Post L   2.51   DLCO uncorrected ml/min/mmHg 14.76  P 13.56  16.88   DLCO UNC% % 66  P 60  73   DLCO corrected ml/min/mmHg 14.84  P 13.45  16.88   DLCO COR %Predicted % 67  P 60  73   DLVA Predicted % 66  P 66  80   TLC L   5.39   TLC % Predicted %   83   RV % Predicted %   65     P Preliminary result       has a past medical history of Burn, Coronary artery disease, Diverticul disease small and large intestine, no perforati or abscess, GERD (gastroesophageal reflux disease), Hernia, History of nuclear stress test, Hyperlipidemia, Low back pain, Oxygen toxicity, Plantar fasciitis, Retinal tear, Right rotator cuff tear, and Shingles.   reports that he quit smoking about 26 years ago. His smoking use included cigarettes. He has a 40.00 pack-year smoking history. He has never used smokeless tobacco.  Past Surgical History:  Procedure Laterality Date   EYE SURGERY     PACEMAKER IMPLANT N/A 01/04/2021   Procedure: PACEMAKER IMPLANT;   Surgeon: Regan Lemming, MD;  Location: [facility];  Service: Cardiovascular;  Laterality: N/A;   REFRACTIVE SURGERY     retina repair and retina tear   SHOULDER ARTHROSCOPY     SHOULDER SURGERY     TONSILLECTOMY      No  Known Allergies  Immunization History  Administered Date(s) Administered   Influenza Split 09/22/2009, 06/01/2010, 06/07/2011, 05/27/2012, 07/28/2013   Influenza, High Dose Seasonal PF 06/15/2015, 06/05/2016, 06/08/2017, 06/07/2018   Influenza, Quadrivalent, Recombinant, Inj, Pf 06/07/2018, 06/12/2019, 06/22/2020, 06/01/2021   Influenza,inj,Quad PF,6+ Mos 06/18/2014   Moderna Covid-19 Vaccine Bivalent Booster 18yrs & up 06/14/2021, 01/05/2022   PFIZER(Purple Top)SARS-COV-2 Vaccination 10/05/2019, 10/27/2019, 06/03/2020   Pneumococcal Conjugate-13 08/12/2013, 06/13/2014   Pneumococcal Polysaccharide-23 11/04/2000, 09/22/2009, 08/16/2010   Td 09/10/1998, 09/22/2009, 12/23/2019   Tdap 10/02/2010   Typhoid Inactivated 09/09/2015   Zoster Recombinat (Shingrix) 01/13/2017, 04/26/2017   Zoster, Live 09/16/2007, 09/22/2009    Family History  Problem Relation Age of Onset   Hyperlipidemia Mother    Diabetes Father    Heart disease Father    Coronary artery disease Brother      Current Outpatient Medications:    albuterol (VENTOLIN HFA) 108 (90 Base) MCG/ACT inhaler, Inhale 1-2 puffs into the lungs every 6 (six) hours as needed for wheezing or shortness of breath., Disp: 18 g, Rfl: 1   Alirocumab (PRALUENT) 150 MG/ML SOAJ, Inject 150 mg as directed every 30 (thirty) days. , Disp: , Rfl:    allopurinol (ZYLOPRIM) 100 MG tablet, Take 1 tablet by mouth 2 (two) times daily., Disp: , Rfl:    Alpha-Lipoic Acid 200 MG CAPS, Take 1 capsule by mouth in the morning and at bedtime., Disp: , Rfl:    aspirin 81 MG tablet, Take 81 mg by mouth daily., Disp: , Rfl:    atorvastatin (LIPITOR) 20 MG tablet, Take 10 mg by mouth at bedtime., Disp: , Rfl:    Coenzyme Q10 300  MG CAPS, Take 1 capsule by mouth every morning., Disp: , Rfl: 0   colchicine 0.6 MG tablet, Take 1 tablet by mouth 2 (two) times daily as needed (gout flare)., Disp: , Rfl:    COVID-19 mRNA bivalent vaccine, Moderna, (MODERNA COVID-19 BIVAL BOOSTER) 50 MCG/0.5ML injection, Inject into the muscle., Disp: 0.5 mL, Rfl: 0   dorzolamide-timolol (COSOPT) 22.3-6.8 MG/ML ophthalmic solution, Place 1 drop into the right eye 2 (two) times daily., Disp: , Rfl:    fluorometholone (FML) 0.1 % ophthalmic suspension, Place 1 drop into the right eye daily., Disp: , Rfl:    Guaifenesin 1200 MG TB12, Take 1,200 mg by mouth 2 (two) times daily., Disp: , Rfl:    Loperamide-Simethicone 2-125 MG TABS, Take 1 tablet by mouth as needed. Up to 4 times a day., Disp: , Rfl:    Multiple Vitamins-Minerals (CENTRUM SILVER PO), Take 1 tablet by mouth every evening., Disp: , Rfl:    Omega-3 Fatty Acids (FISH OIL CONCENTRATE PO), Take 2,400 mg by mouth in the morning and at bedtime., Disp: , Rfl:    pantoprazole (PROTONIX) 40 MG tablet, TAKE 1 TABLET BY MOUTH EVERY DAY, Disp: 90 tablet, Rfl: 3   Pirfenidone (ESBRIET) 801 MG TABS, Take 1 tablet by mouth in the morning, at noon, and at bedtime., Disp: 270 tablet, Rfl: 0   Polyethyl Glycol-Propyl Glycol (SYSTANE) 0.4-0.3 % SOLN, Apply 1 drop to eye daily as needed (dry eyes)., Disp: , Rfl:    predniSONE (DELTASONE) 10 MG tablet, Take 4 tablets (40 mg total) by mouth daily with breakfast for 2 days, THEN 2 tablets (20 mg total) daily with breakfast for 2 days, THEN 1 tablet (10 mg total) daily with breakfast for 2 days, THEN 0.5 tablets (5 mg total) daily with breakfast for 2 days., Disp: 15 tablet, Rfl: 0  SPIRIVA HANDIHALER 18 MCG inhalation capsule, INHALE 1 CAPSULE VIA HANDIHALER ONCE DAILY AT THE SAME TIME EVERY DAY, Disp: 30 capsule, Rfl: 6   triamcinolone cream (KENALOG) 0.1 %, Apply topically in the morning and at bedtime., Disp: , Rfl:    valACYclovir (VALTREX) 1000 MG tablet,  Take 1 tablet by mouth daily as needed (fever blisters)., Disp: , Rfl:    valsartan (DIOVAN) 320 MG tablet, TAKE 1 TABLET (320 MG TOTAL) BY MOUTH EVERY EVENING., Disp: 90 tablet, Rfl: 3   vitamin C (ASCORBIC ACID) 500 MG tablet, Take 500 mg by mouth every evening., Disp: , Rfl:    doxycycline (VIBRA-TABS) 100 MG tablet, Take 1 tablet (100 mg total) by mouth 2 (two) times daily., Disp: 20 tablet, Rfl: 0      Objective:   Vitals:   03/02/22 0926  BP: 128/74  Pulse: 62  SpO2: 97%  Weight: 162 lb (73.5 kg)  Height: 5\' 6"  (1.676 m)    Estimated body mass index is 26.15 kg/m as calculated from the following:   Height as of this encounter: 5\' 6"  (1.676 m).   Weight as of this encounter: 162 lb (73.5 kg).  @WEIGHTCHANGE @  [employer]   03/02/22 0926  Weight: 162 lb (73.5 kg)     Physical Exam    General: No distress. Looks well Neuro: Alert and Oriented x 3. GCS 15. Speech normal Psych: Pleasant Resp:  Barrel Chest - no.  Wheeze - no, Crackles - YES Velcro base, No overt respiratory distress CVS: Normal heart sounds. Murmurs - no Ext: Stigmata of Connective Tissue Disease - no HEENT: Normal upper airway. PEERL +. No post nasal drip        Assessment:       ICD-10-CM   1. IPF (idiopathic pulmonary fibrosis) (HCC)  J84.112 ECHOCARDIOGRAM COMPLETE    Pulmonary function test    Hepatic function panel    CBC with Differential/Platelet    Basic metabolic panel    B Nat Peptide    Pulse oximetry, overnight    B Nat Peptide    Basic metabolic panel    CBC with Differential/Platelet    Hepatic function panel    CANCELED: Pulse oximetry, overnight    2. Dyspnea on exertion  R06.09 ECHOCARDIOGRAM COMPLETE    Pulmonary function test    Hepatic function panel    CBC with Differential/Platelet    Basic metabolic panel    B Nat Peptide    Pulse oximetry, overnight    B Nat Peptide    Basic metabolic panel    CBC with Differential/Platelet    Hepatic function panel     CANCELED: Pulse oximetry, overnight         Plan:     Patient Instructions  IPF (idiopathic pulmonary fibrosis) (HCC) Dyspnea on exertion  - lung function stable - concer for worsening desaturations at night - LFT nromal Feb 2023   Plan  = check esbriet safety  - do blood LFT, cbc, bmet - eval for pulmonar hypertension   - do blood BNP,   - echo echo  - cotniue esbriet as before - will prescreen for  MOONSCAPE study - if this is not possible can look at [facility] oral drug study - check ONO on 2L [place]  - check ECHO next week   - please email me directly 24-72h after tests done so I can get results to you faster -Do spirometry and DLCO in 3 months as stanard of care  -  can cancel if in research progoam   GERD  Mild activation recently  Plan  - stop fish oil  - continue other measures  Travel advice encounter -to [place] on March 20, 2022  -Mask with precaution and adjusting for risk based on community prevalence and avoiding indoor clusters - If you want -Refill doxycycline 100 mg twice daily x10 days -Refill prednisone taper for 8 days as follows - Take prednisone 40 mg daily x 2 days, then 20mg  daily x 2 days, then 10mg  daily x 2 days, then 5mg  daily x 2 days and stop   Dry skin  -improved  Plan -Continue new lotion and flaxseed powder or seeds with your meals.     Follow-up - At 48-month standard of care visit with Dr. Marchelle Gearing 30 minutes-but after spirometry and DLCO  -Symptom questionnaire and walk test at follow-up  High complex condition that requires intensive therapeutic monitoring  SIGNATURE    Dr. Kalman Shan, M.D., F.C.C.P,  Pulmonary and Critical Care Medicine Staff Physician, [facility] Center Director - Interstitial Lung Disease  Program  Pulmonary Fibrosis [facility], [place], [address]  Pager: [phone], If no answer or between  15:00h - 7:00h: call [phone] Telephone: [phone]  6:45 PM 03/02/2022

## 2022-03-02 ENCOUNTER — Ambulatory Visit (INDEPENDENT_AMBULATORY_CARE_PROVIDER_SITE_OTHER): Payer: Medicare Other | Admitting: Internal Medicine

## 2022-03-02 ENCOUNTER — Telehealth: Payer: Self-pay | Admitting: Internal Medicine

## 2022-03-02 ENCOUNTER — Encounter: Payer: Self-pay | Admitting: Internal Medicine

## 2022-03-02 VITALS — BP 128/74 | HR 62 | Ht 66.0 in | Wt 162.0 lb

## 2022-03-02 DIAGNOSIS — J84112 Idiopathic pulmonary fibrosis: Secondary | ICD-10-CM

## 2022-03-02 DIAGNOSIS — I2510 Atherosclerotic heart disease of native coronary artery without angina pectoris: Secondary | ICD-10-CM

## 2022-03-02 DIAGNOSIS — R0609 Other forms of dyspnea: Secondary | ICD-10-CM | POA: Diagnosis not present

## 2022-03-02 LAB — BASIC METABOLIC PANEL
BUN: 15 mg/dL (ref 6–23)
CO2: 28 mEq/L (ref 19–32)
Calcium: 9.1 mg/dL (ref 8.4–10.5)
Chloride: 106 mEq/L (ref 96–112)
Creatinine, Ser: 0.87 mg/dL (ref 0.40–1.50)
GFR: 84.27 mL/min (ref >60.00)
Glucose, Bld: 99 mg/dL (ref 70–99)
Potassium: 4.1 mEq/L (ref 3.5–5.1)
Sodium: 140 mEq/L (ref 135–145)

## 2022-03-02 LAB — PULMONARY FUNCTION TEST
DL/VA % pred: 66 %
DL/VA: 2.69 ml/min/mmHg/L
DLCO cor % pred: 67 %
DLCO cor: 14.84 ml/min/mmHg
DLCO unc % pred: 66 %
DLCO unc: 14.76 ml/min/mmHg
FEF 25-75 Pre: 1.66 L/sec
FEF2575-%Pred-Pre: 88 %
FEV1-%Pred-Pre: 106 %
FEV1-Pre: 2.75 L
FEV1FVC-%Pred-Pre: 97 %
FEV6-%Pred-Pre: 114 %
FEV6-Pre: 3.81 L
FEV6FVC-%Pred-Pre: 105 %
FVC-%Pred-Pre: 108 %
FVC-Pre: 3.89 L
Pre FEV1/FVC ratio: 71 %
Pre FEV6/FVC Ratio: 98 %

## 2022-03-02 LAB — CBC WITH DIFFERENTIAL/PLATELET
Basophils Absolute: 0.1 10*3/uL (ref 0.0–0.1)
Basophils Relative: 0.7 % (ref 0.0–3.0)
Eosinophils Absolute: 0.2 10*3/uL (ref 0.0–0.7)
Eosinophils Relative: 2.5 % (ref 0.0–5.0)
HCT: 41.3 % (ref 39.0–52.0)
Hemoglobin: 13.8 g/dL (ref 13.0–17.0)
Lymphocytes Relative: 23.9 % (ref 12.0–46.0)
Lymphs Abs: 2 10*3/uL (ref 0.7–4.0)
MCHC: 33.4 g/dL (ref 30.0–36.0)
MCV: 102.8 fl — ABNORMAL HIGH (ref 78.0–100.0)
Monocytes Absolute: 0.6 10*3/uL (ref 0.1–1.0)
Monocytes Relative: 7.7 % (ref 3.0–12.0)
Neutro Abs: 5.4 10*3/uL (ref 1.4–7.7)
Neutrophils Relative %: 65.2 % (ref 43.0–77.0)
Platelets: 262 10*3/uL (ref 150.0–400.0)
RBC: 4.02 Mil/uL — ABNORMAL LOW (ref 4.22–5.81)
RDW: 13.4 % (ref 11.5–15.5)
WBC: 8.3 10*3/uL (ref 4.0–10.5)

## 2022-03-02 LAB — HEPATIC FUNCTION PANEL
ALT: 23 U/L (ref 0–53)
AST: 24 U/L (ref 0–37)
Albumin: 4.2 g/dL (ref 3.5–5.2)
Alkaline Phosphatase: 38 U/L — ABNORMAL LOW (ref 39–117)
Bilirubin, Direct: 0.1 mg/dL (ref 0.0–0.3)
Total Bilirubin: 0.4 mg/dL (ref 0.2–1.2)
Total Protein: 6.8 g/dL (ref 6.0–8.3)

## 2022-03-02 LAB — BRAIN NATRIURETIC PEPTIDE: Pro B Natriuretic peptide (BNP): 28 pg/mL (ref 0.0–100.0)

## 2022-03-02 MED ORDER — DOXYCYCLINE HYCLATE 100 MG PO TABS: 100.0000 mg | ORAL_TABLET | Freq: Two times a day (BID) | ORAL | 0 refills | Status: DC

## 2022-03-02 MED ORDER — PREDNISONE 10 MG PO TABS: ORAL_TABLET | ORAL | 0 refills | Status: AC

## 2022-03-06 ENCOUNTER — Ambulatory Visit (HOSPITAL_COMMUNITY): Payer: Medicare Other | Attending: Internal Medicine

## 2022-03-06 DIAGNOSIS — R0609 Other forms of dyspnea: Secondary | ICD-10-CM | POA: Diagnosis not present

## 2022-03-06 DIAGNOSIS — J84112 Idiopathic pulmonary fibrosis: Secondary | ICD-10-CM

## 2022-03-07 LAB — ECHOCARDIOGRAM COMPLETE
Area-P 1/2: 2.08 cm2
S' Lateral: 3.3 cm

## 2022-03-07 NOTE — Progress Notes (Signed)
ECHo normal but for mild heart stiffness.

## 2022-03-12 ENCOUNTER — Other Ambulatory Visit: Payer: Self-pay | Admitting: *Deleted

## 2022-03-12 ENCOUNTER — Telehealth: Payer: Self-pay | Admitting: Internal Medicine

## 2022-03-12 MED ORDER — NIRMATRELVIR/RITONAVIR (PAXLOVID)TABLET: 3.0000 | ORAL_TABLET | Freq: Two times a day (BID) | ORAL | 0 refills | Status: AC

## 2022-03-12 NOTE — Telephone Encounter (Signed)
Called and spoke with pt who stated when he went to pick up Rx from pharmacy for paxlovid and doxycycline to have on hand when he went to [place], pt found out that the Rx for paxlovid was expired. Stated to pt that I would send a new rx and he verbalized understanding. Rx sent to pharmacy for pt. Nothing further needed.

## 2022-04-14 ENCOUNTER — Other Ambulatory Visit: Payer: Self-pay | Admitting: Internal Medicine

## 2022-04-14 DIAGNOSIS — I2510 Atherosclerotic heart disease of native coronary artery without angina pectoris: Secondary | ICD-10-CM

## 2022-04-14 DIAGNOSIS — R0609 Other forms of dyspnea: Secondary | ICD-10-CM

## 2022-04-16 ENCOUNTER — Ambulatory Visit (INDEPENDENT_AMBULATORY_CARE_PROVIDER_SITE_OTHER): Payer: Medicare Other

## 2022-04-16 DIAGNOSIS — I442 Atrioventricular block, complete: Secondary | ICD-10-CM | POA: Diagnosis not present

## 2022-04-17 LAB — CUP PACEART REMOTE DEVICE CHECK
Battery Remaining Longevity: 116 mo
Battery Remaining Percentage: 92 %
Battery Voltage: 3.01 V
Brady Statistic AP VP Percent: 1.6 %
Brady Statistic AP VS Percent: 1 %
Brady Statistic AS VP Percent: 91 %
Brady Statistic AS VS Percent: 6.7 %
Brady Statistic RA Percent Paced: 2.3 %
Brady Statistic RV Percent Paced: 93 %
Date Time Interrogation Session: 20230807020017
Implantable Lead Implant Date: 20220427
Implantable Lead Implant Date: 20220427
Implantable Lead Location: 753859
Implantable Lead Location: 753860
Implantable Pulse Generator Implant Date: 20220427
Lead Channel Impedance Value: 440 Ohm
Lead Channel Impedance Value: 510 Ohm
Lead Channel Pacing Threshold Amplitude: 0.875 V
Lead Channel Pacing Threshold Amplitude: 1 V
Lead Channel Pacing Threshold Pulse Width: 0.5 ms
Lead Channel Pacing Threshold Pulse Width: 0.5 ms
Lead Channel Sensing Intrinsic Amplitude: 12 mV
Lead Channel Sensing Intrinsic Amplitude: 3.8 mV
Lead Channel Setting Pacing Amplitude: 1.125
Lead Channel Setting Pacing Amplitude: 2 V
Lead Channel Setting Pacing Pulse Width: 0.5 ms
Lead Channel Setting Sensing Sensitivity: 2 mV
Pulse Gen Model: 2272
Pulse Gen Serial Number: 3920271

## 2022-04-23 NOTE — Telephone Encounter (Signed)
Seems like encounter was open in error so closing encounter.  

## 2022-04-25 ENCOUNTER — Telehealth: Payer: Self-pay | Admitting: Pharmacist

## 2022-04-25 DIAGNOSIS — J84112 Idiopathic pulmonary fibrosis: Secondary | ICD-10-CM

## 2022-04-25 MED ORDER — PIRFENIDONE 801 MG PO TABS: 1.0000 | ORAL_TABLET | Freq: Three times a day (TID) | ORAL | 0 refills | Status: DC

## 2022-04-25 NOTE — Telephone Encounter (Signed)
Refill sent for ESBRIET to [facility]ician, dispensing) for Esbriet: [phone]  Dose: 801 mg three times daily  Last OV: 03/02/22 Provider: Dr. Chase Caller  Next OV: 06/07/22  Labs on 03/02/22 stable. Due for repeat labs at apt on 06/07/22  Knox Saliva, PharmD, MPH, BCPS Clinical Pharmacist (Rheumatology and Pulmonology)

## 2022-04-27 DIAGNOSIS — H35372 Puckering of macula, left eye: Secondary | ICD-10-CM | POA: Diagnosis not present

## 2022-04-27 DIAGNOSIS — H40012 Open angle with borderline findings, low risk, left eye: Secondary | ICD-10-CM | POA: Diagnosis not present

## 2022-05-17 NOTE — Progress Notes (Signed)
Remote pacemaker transmission.   

## 2022-05-18 ENCOUNTER — Other Ambulatory Visit (HOSPITAL_BASED_OUTPATIENT_CLINIC_OR_DEPARTMENT_OTHER): Payer: Self-pay

## 2022-05-18 MED ORDER — AREXVY 120 MCG/0.5ML IM SUSR
INTRAMUSCULAR | 0 refills | Status: DC
  Filled 2022-05-18: qty 0.5, 1d supply, fill #0

## 2022-05-23 ENCOUNTER — Other Ambulatory Visit: Payer: Self-pay | Admitting: Internal Medicine

## 2022-05-23 DIAGNOSIS — Z006 Encounter for examination for normal comparison and control in clinical research program: Secondary | ICD-10-CM

## 2022-05-23 DIAGNOSIS — J84112 Idiopathic pulmonary fibrosis: Secondary | ICD-10-CM

## 2022-05-31 ENCOUNTER — Emergency Department (HOSPITAL_BASED_OUTPATIENT_CLINIC_OR_DEPARTMENT_OTHER): Payer: Medicare Other

## 2022-05-31 ENCOUNTER — Other Ambulatory Visit: Payer: Self-pay

## 2022-05-31 ENCOUNTER — Encounter (HOSPITAL_BASED_OUTPATIENT_CLINIC_OR_DEPARTMENT_OTHER): Payer: Self-pay

## 2022-05-31 ENCOUNTER — Emergency Department (HOSPITAL_BASED_OUTPATIENT_CLINIC_OR_DEPARTMENT_OTHER)
Admission: EM | Admit: 2022-05-31 | Discharge: 2022-05-31 | Disposition: A | Discharge status: Home / Self Care | Payer: Medicare Other | Attending: Emergency Medicine | Admitting: Emergency Medicine

## 2022-05-31 ENCOUNTER — Other Ambulatory Visit (HOSPITAL_BASED_OUTPATIENT_CLINIC_OR_DEPARTMENT_OTHER): Payer: Self-pay

## 2022-05-31 DIAGNOSIS — R109 Unspecified abdominal pain: Secondary | ICD-10-CM

## 2022-05-31 DIAGNOSIS — I700 Atherosclerosis of aorta: Secondary | ICD-10-CM | POA: Diagnosis not present

## 2022-05-31 DIAGNOSIS — Z950 Presence of cardiac pacemaker: Secondary | ICD-10-CM | POA: Insufficient documentation

## 2022-05-31 DIAGNOSIS — I2510 Atherosclerotic heart disease of native coronary artery without angina pectoris: Secondary | ICD-10-CM | POA: Diagnosis not present

## 2022-05-31 DIAGNOSIS — D72829 Elevated white blood cell count, unspecified: Secondary | ICD-10-CM | POA: Insufficient documentation

## 2022-05-31 DIAGNOSIS — I10 Essential (primary) hypertension: Secondary | ICD-10-CM | POA: Insufficient documentation

## 2022-05-31 DIAGNOSIS — Z79899 Other long term (current) drug therapy: Secondary | ICD-10-CM | POA: Insufficient documentation

## 2022-05-31 DIAGNOSIS — Z7982 Long term (current) use of aspirin: Secondary | ICD-10-CM | POA: Diagnosis not present

## 2022-05-31 DIAGNOSIS — R1031 Right lower quadrant pain: Secondary | ICD-10-CM | POA: Insufficient documentation

## 2022-05-31 LAB — COMPREHENSIVE METABOLIC PANEL
ALT: 17 U/L (ref 0–44)
AST: 19 U/L (ref 15–41)
Albumin: 4.4 g/dL (ref 3.5–5.0)
Alkaline Phosphatase: 45 U/L (ref 38–126)
Anion gap: 12 (ref 5–15)
BUN: 17 mg/dL (ref 8–23)
CO2: 21 mmol/L — ABNORMAL LOW (ref 22–32)
Calcium: 9.2 mg/dL (ref 8.9–10.3)
Chloride: 106 mmol/L (ref 98–111)
Creatinine, Ser: 0.8 mg/dL (ref 0.61–1.24)
GFR, Estimated: >60 mL/min (ref >60)
Glucose, Bld: 110 mg/dL — ABNORMAL HIGH (ref 70–99)
Potassium: 4 mmol/L (ref 3.5–5.1)
Sodium: 139 mmol/L (ref 135–145)
Total Bilirubin: 0.3 mg/dL (ref 0.3–1.2)
Total Protein: 7 g/dL (ref 6.5–8.1)

## 2022-05-31 LAB — CBC WITH DIFFERENTIAL/PLATELET
Abs Immature Granulocytes: 0.04 10*3/uL (ref 0.00–0.07)
Basophils Absolute: 0.1 10*3/uL (ref 0.0–0.1)
Basophils Relative: 0 %
Eosinophils Absolute: 0.1 10*3/uL (ref 0.0–0.5)
Eosinophils Relative: 1 %
HCT: 41.2 % (ref 39.0–52.0)
Hemoglobin: 13.8 g/dL (ref 13.0–17.0)
Immature Granulocytes: 0 %
Lymphocytes Relative: 16 %
Lymphs Abs: 1.8 10*3/uL (ref 0.7–4.0)
MCH: 33.2 pg (ref 26.0–34.0)
MCHC: 33.5 g/dL (ref 30.0–36.0)
MCV: 99 fL (ref 80.0–100.0)
Monocytes Absolute: 0.9 10*3/uL (ref 0.1–1.0)
Monocytes Relative: 8 %
Neutro Abs: 8.4 10*3/uL — ABNORMAL HIGH (ref 1.7–7.7)
Neutrophils Relative %: 75 %
Platelets: 317 10*3/uL (ref 150–400)
RBC: 4.16 MIL/uL — ABNORMAL LOW (ref 4.22–5.81)
RDW: 13 % (ref 11.5–15.5)
WBC: 11.3 10*3/uL — ABNORMAL HIGH (ref 4.0–10.5)
nRBC: 0 % (ref 0.0–0.2)

## 2022-05-31 LAB — URINALYSIS, ROUTINE W REFLEX MICROSCOPIC
Bilirubin Urine: NEGATIVE
Glucose, UA: NEGATIVE mg/dL
Hgb urine dipstick: NEGATIVE
Ketones, ur: NEGATIVE mg/dL
Leukocytes,Ua: NEGATIVE
Nitrite: NEGATIVE
Protein, ur: NEGATIVE mg/dL
Specific Gravity, Urine: 1.011 (ref 1.005–1.030)
pH: 5.5 (ref 5.0–8.0)

## 2022-05-31 LAB — LIPASE, BLOOD: Lipase: <10 U/L — ABNORMAL LOW (ref 11–51)

## 2022-05-31 MED ORDER — MORPHINE SULFATE (PF) 4 MG/ML IV SOLN
4.0000 mg | Freq: Once | INTRAVENOUS | Status: DC
  Filled 2022-05-31: qty 1

## 2022-05-31 MED ORDER — CYCLOBENZAPRINE HCL 10 MG PO TABS
10.0000 mg | ORAL_TABLET | Freq: Two times a day (BID) | ORAL | 0 refills | Status: DC | PRN
  Filled 2022-05-31: qty 20, 10d supply, fill #0

## 2022-05-31 MED ORDER — ACETAMINOPHEN 325 MG PO TABS
650.0000 mg | ORAL_TABLET | Freq: Once | ORAL | Status: AC
  Administered 2022-05-31: 650 mg via ORAL
  Filled 2022-05-31: qty 2

## 2022-05-31 MED ORDER — LIDOCAINE 5 % EX PTCH
1.0000 | MEDICATED_PATCH | CUTANEOUS | 0 refills | Status: DC
  Filled 2022-05-31: qty 7, 7d supply, fill #0

## 2022-05-31 MED ORDER — HYDROCODONE-ACETAMINOPHEN 5-325 MG PO TABS
1.0000 | ORAL_TABLET | Freq: Four times a day (QID) | ORAL | 0 refills | Status: DC | PRN
  Filled 2022-05-31: qty 12, 3d supply, fill #0

## 2022-05-31 MED ORDER — IOHEXOL 350 MG/ML SOLN
100.0000 mL | Freq: Once | INTRAVENOUS | Status: AC | PRN
  Administered 2022-05-31: 80 mL via INTRAVENOUS

## 2022-05-31 NOTE — ED Provider Notes (Signed)
[facility] DEPT Provider Note   CSN: [id number] Arrival date & time: 05/31/22  1030     History  Chief Complaint  Patient presents with   Flank Pain    Reginald Davis is a 76 y.o. male.   Flank Pain     Patient has history of coronary artery disease hyperlipidemia hypertension chronic lung disease, coronary artery disease, complete heart block status post pacemaker.  Patient presents with acute right flank pain.  He did move some boxes the other day but does not recall any specific injury.  Right flank pain woke him up from sleep.  It is very sharp.  Increases with outpatient and movement.  He does have some milder lower abdominal pain earlier.  He is not having any fevers although his temperature was higher than usual when he was at home today.  He has not had any urinary discomfort.  Home Medications Prior to Admission medications   Medication Sig Start Date End Date Taking? Authorizing Provider  cyclobenzaprine (FLEXERIL) 10 MG tablet Take 1 tablet (10 mg total) by mouth 2 (two) times daily as needed for muscle spasms. 05/31/22  Yes Dorie Rank, MD  HYDROcodone-acetaminophen (NORCO/VICODIN) 5-325 MG tablet Take 1 tablet by mouth every 6 (six) hours as needed. 05/31/22  Yes Dorie Rank, MD  lidocaine (LIDODERM) 5 % Place 1 patch onto the skin daily. Remove & Discard patch within 12 hours or as directed by MD 05/31/22  Yes Dorie Rank, MD  albuterol (VENTOLIN HFA) 108 (90 Base) MCG/ACT inhaler Inhale 1-2 puffs into the lungs every 6 (six) hours as needed for wheezing or shortness of breath. 08/29/20   Martyn Ehrich, NP  Alirocumab (PRALUENT) 150 MG/ML SOAJ Inject 150 mg as directed every 30 (thirty) days.  10/29/18   [provider]  allopurinol (ZYLOPRIM) 100 MG tablet Take 1 tablet by mouth 2 (two) times daily. 08/15/16   [provider]  Alpha-Lipoic Acid 200 MG CAPS Take 1 capsule by mouth in the morning and at bedtime.    [provider]  aspirin 81 MG tablet Take 81 mg by mouth daily.    [provider]  atorvastatin (LIPITOR) 20 MG tablet Take 10 mg by mouth at bedtime.    [provider]  Coenzyme Q10 300 MG CAPS Take 1 capsule by mouth every morning. 05/26/12   Larey Dresser, MD  colchicine 0.6 MG tablet Take 1 tablet by mouth 2 (two) times daily as needed (gout flare). 08/21/16   [provider]  COVID-19 mRNA bivalent vaccine, Moderna, (MODERNA COVID-19 BIVAL BOOSTER) 50 MCG/0.5ML injection Inject into the muscle. 01/05/22     dorzolamide-timolol (COSOPT) 22.3-6.8 MG/ML ophthalmic solution Place 1 drop into the right eye 2 (two) times daily.    [provider]  doxycycline (VIBRA-TABS) 100 MG tablet Take 1 tablet (100 mg total) by mouth 2 (two) times daily. 03/02/22   Brand Males, MD  fluorometholone (FML) 0.1 % ophthalmic suspension Place 1 drop into the right eye daily. 02/28/22   [provider]  Guaifenesin 1200 MG TB12 Take 1,200 mg by mouth 2 (two) times daily.    [provider]  Loperamide-Simethicone 2-125 MG TABS Take 1 tablet by mouth as needed. Up to 4 times a day.    [provider]  Multiple Vitamins-Minerals (CENTRUM SILVER PO) Take 1 tablet by mouth every evening.    [provider]  Omega-3 Fatty Acids (FISH OIL CONCENTRATE PO) Take 2,400 mg  by mouth in the morning and at bedtime.    [provider]  pantoprazole (PROTONIX) 40 MG tablet TAKE 1 TABLET BY MOUTH EVERY DAY 09/06/21   Brand Males, MD  Pirfenidone (ESBRIET) 801 MG TABS Take 1 tablet by mouth in the morning, at noon, and at bedtime. 04/25/22   Brand Males, MD  Polyethyl Glycol-Propyl Glycol (SYSTANE) 0.4-0.3 % SOLN Apply 1 drop to eye daily as needed (dry eyes).    [provider]  RSV vaccine recomb adjuvanted (AREXVY) 120 MCG/0.5ML injection Inject into the muscle. 05/18/22   Carlyle Basques, MD  SPIRIVA HANDIHALER 18 MCG  inhalation capsule INHALE 1 CAPSULE VIA HANDIHALER ONCE DAILY AT THE SAME TIME EVERY DAY 04/16/22   Brand Males, MD  triamcinolone cream (KENALOG) 0.1 % Apply topically in the morning and at bedtime. 06/08/21   [provider]  valACYclovir (VALTREX) 1000 MG tablet Take 1 tablet by mouth daily as needed (fever blisters). 07/26/14   [provider]  valsartan (DIOVAN) 320 MG tablet TAKE 1 TABLET (320 MG TOTAL) BY MOUTH EVERY EVENING. 09/18/21   Skeet Latch, MD  vitamin C (ASCORBIC ACID) 500 MG tablet Take 500 mg by mouth every evening.    [provider]      Allergies    Patient has no known allergies.    Review of Systems   Review of Systems  Genitourinary:  Positive for flank pain.    Physical Exam Updated Vital Signs BP (!) 153/85   Pulse 66   Temp 98.4 F (36.9 C) (Oral)   Resp 16   Ht 1.676 m ('5\' 6"'$ )   Wt 68.9 kg   SpO2 98%   BMI 24.53 kg/m  Physical Exam Vitals and nursing note reviewed.  Constitutional:      General: He is not in acute distress.    Appearance: He is well-developed.  HENT:     Head: Normocephalic and atraumatic.     Right Ear: External ear normal.     Left Ear: External ear normal.  Eyes:     General: No scleral icterus.       Right eye: No discharge.        Left eye: No discharge.     Conjunctiva/sclera: Conjunctivae normal.  Neck:     Trachea: No tracheal deviation.  Cardiovascular:     Rate and Rhythm: Normal rate and regular rhythm.  Pulmonary:     Effort: Pulmonary effort is normal. No respiratory distress.     Breath sounds: Normal breath sounds. No stridor. No wheezing or rales.  Abdominal:     General: Bowel sounds are normal. There is no distension.     Palpations: Abdomen is soft. There is no mass.     Tenderness: There is abdominal tenderness. There is right CVA tenderness. There is no guarding or rebound.     Comments: Mild tenderness right lower abdomen, no mass appreciated  Musculoskeletal:         General: No tenderness or deformity.     Cervical back: Neck supple.  Skin:    General: Skin is warm and dry.     Findings: No rash.  Neurological:     General: No focal deficit present.     Mental Status: He is alert.     Cranial Nerves: No cranial nerve deficit (no facial droop, extraocular movements intact, no slurred speech).     Sensory: No sensory deficit.     Motor: No abnormal muscle tone or seizure  activity.     Coordination: Coordination normal.  Psychiatric:        Mood and Affect: Mood normal.     ED Results / Procedures / Treatments   Labs (all labs ordered are listed, but only abnormal results are displayed) Labs Reviewed  COMPREHENSIVE METABOLIC PANEL - Abnormal; Notable for the following components:      Result Value   CO2 21 (*)    Glucose, Bld 110 (*)    All other components within normal limits  LIPASE, BLOOD - Abnormal; Notable for the following components:   Lipase <10 (*)    All other components within normal limits  CBC WITH DIFFERENTIAL/PLATELET - Abnormal; Notable for the following components:   WBC 11.3 (*)    RBC 4.16 (*)    Neutro Abs 8.4 (*)    All other components within normal limits  URINALYSIS, ROUTINE W REFLEX MICROSCOPIC    EKG EKG Interpretation  Date/Time:  Thursday May 31 2022 11:02:27 EDT Ventricular Rate:  70 PR Interval:  199 QRS Duration: 169 QT Interval:  447 QTC Calculation: 483 R Axis:   11 Text Interpretation: Sinus rhythm Left bundle branch block No significant change since last tracing Confirmed by Dorie Rank [phone]) on 05/31/2022 11:24:00 AM  Radiology CT Angio Chest/Abd/Pel for Dissection W and/or Wo Contrast  Result Date: 05/31/2022 CLINICAL DATA:  Back pain, flank pain, clinical suspicion for acute aortic syndrome EXAM: CT ANGIOGRAPHY CHEST, ABDOMEN AND PELVIS TECHNIQUE: Non-contrast CT of the chest was initially obtained. Multidetector CT imaging through the chest, abdomen and pelvis was performed using  the standard protocol during bolus administration of intravenous contrast. Multiplanar reconstructed images and MIPs were obtained and reviewed to evaluate the vascular anatomy. RADIATION DOSE REDUCTION: This exam was performed according to the departmental dose-optimization program which includes automated exposure control, adjustment of the mA and/or kV according to patient size and/or use of iterative reconstruction technique. CONTRAST:  54m OMNIPAQUE IOHEXOL 350 MG/ML SOLN COMPARISON:  CT chest done on 08/15/2021 FINDINGS: CTA CHEST FINDINGS Cardiovascular: There is no demonstrable mural hematoma in thoracic aorta in the noncontrast images. Coronary artery calcifications are seen. There is homogeneous enhancement in thoracic aorta. Scattered atherosclerotic plaques and calcifications are seen. There are no intraluminal filling defects in central pulmonary artery branches. Evaluation of small peripheral branches is limited by motion artifacts. Mediastinum/Nodes: No significant lymphadenopathy is seen in mediastinum. Subcentimeter nodes are seen in mediastinum and both hilar regions. Lungs/Pleura: Centrilobular emphysema is seen. There is slight prominence of interstitial markings in the periphery of both lungs. This may suggest interstitial lung disease and minimal scarring. There is no focal pulmonary consolidation. Breathing motion limits evaluation of lower lung fields. There is no pleural effusion or pneumothorax. Musculoskeletal: No acute findings are seen in bony structures in the thorax. Review of the MIP images confirms the above findings. CTA ABDOMEN AND PELVIS FINDINGS VASCULAR Aorta: There are scattered atherosclerotic plaques and calcifications. There is no evidence of dissection or focal aneurysmal dilation. Celiac: There is mild to moderate narrowing in proximal course. SMA: Unremarkable. Renals: There are calcifications in the proximal courses of renal arteries without significant stenosis. IMA:  Patent. Inflow: Unremarkable. Veins: Unremarkable. Review of the MIP images confirms the above findings. NON-VASCULAR Hepatobiliary: No focal abnormalities are seen in liver. There is reflux of contrast into hepatic veins suggesting possible tricuspid incompetence. There is fatty infiltration in liver. There is no dilation of bile ducts. Gallbladder is unremarkable. Pancreas: Pancreas appears smaller than usual in  size without focal abnormalities. Spleen: Unremarkable. Adrenals/Urinary Tract: Adrenals are unremarkable. There is no hydronephrosis. There are no renal or ureteral stones. Urinary bladder is unremarkable. Stomach/Bowel: Small hiatal hernia is seen. Small bowel loops are not dilated. Cecum is higher and more medial than usual in position. Appendix is not dilated. There is no significant wall thickening in colon. Scattered diverticula are seen in colon without signs of diverticulitis. Lymphatic: Unremarkable. Reproductive: Prostate is enlarged projecting into the base of the bladder. Other: There is no ascites or pneumoperitoneum. Small left inguinal hernia containing fat is seen. Musculoskeletal: Degenerative changes are noted in lumbar spine with encroachment of neural foramina at multiple levels. No acute findings are seen. Review of the MIP images confirms the above findings. IMPRESSION: There is no evidence of dissection in thoracic and abdominal aorta. Major branches of thoracic and abdominal aorta appear patent. Coronary artery calcifications are seen. There is no evidence of central pulmonary artery embolism. Centrilobular emphysema. There is no focal pulmonary consolidation. There is no evidence of intestinal obstruction or pneumoperitoneum. There is no hydronephrosis. Fatty liver. Small hiatal hernia. Enlarged prostate. Diverticulosis of colon. Other findings as described in the body of the report. Electronically Signed   By: Elmer Picker M.D.   On: 05/31/2022 13:10     Procedures Procedures    Medications Ordered in ED Medications  iohexol (OMNIPAQUE) 350 MG/ML injection 100 mL (80 mLs Intravenous Contrast Given 05/31/22 1243)  acetaminophen (TYLENOL) tablet 650 mg (650 mg Oral Given 05/31/22 1332)    ED Course/ Medical Decision Making/ A&P Clinical Course as of 06/01/22 0658  Thu May 31, 2022  1218 Urinalysis, Routine w reflex microscopic Urine, Clean Catch Negative [JK]  1218  for infection or blood [JK]  1218 Lipase, blood(!) [JK]  1218 Normal [JK]  1218 CBC with Diff(!) White blood cell count slightly increased [JK]  3875 Normal metabolic panel [JK]  6433 CT chest abdomen pelvis angiogram reviewed.  No acute abnormalities noted.  Specifically no signs of dissection [JK]    Clinical Course User Index [JK] Dorie Rank, MD                           Medical Decision Making Possible musculoskeletal pain but complaining of abd pain as well.  Concerning for possible dissection, ureteral colic, pancreatitis  Problems Addressed: Flank pain: acute illness or injury that poses a threat to life or bodily functions  Amount and/or Complexity of Data Reviewed Labs: ordered. Decision-making details documented in ED Course. Radiology: ordered and independent interpretation performed.  Risk OTC drugs. Prescription drug management. Risk Details: Concerned initially about possible aortic dissection versus ureteral colic versus musculoskeletal pain.  He ED work-up overall reassuring.  No significant lab abnormalities.  No signs of pancreatitis hepatitis.  No evidence of urinary tract infection.  CT scan did not show no evidence of kidney stones.  Angiogram did not show evidence of dissection.  Pain most likely musculoskeletal in nature.  Will discharge home with pain medications.  Discussed nonnarcotic pain medications but will also provide a short course of hydrocodone for more severe pain not relieved by lidocaine muscle  relaxants.           Final Clinical Impression(s) / ED Diagnoses Final diagnoses:  Flank pain    Rx / DC Orders ED Discharge Orders          Ordered    HYDROcodone-acetaminophen (NORCO/VICODIN) 5-325 MG tablet  Every 6 hours PRN  05/31/22 1403    lidocaine (LIDODERM) 5 %  Every 24 hours        05/31/22 1403    cyclobenzaprine (FLEXERIL) 10 MG tablet  2 times daily PRN        05/31/22 1421              Dorie Rank, MD 06/01/22 [phone]

## 2022-05-31 NOTE — Discharge Instructions (Addendum)
The test today in the ED fortunately did not show any serious cause of the pain in your right flank area.  You can take Tylenol and the Flexeril your doctor prescribed to see if that helps with the pain.  Lidocaine patches are another safe alternative to help with the pain.  I have also prescribed a short course of hydrocodone that can be taken for more severe pain.  It does not have Tylenol in it so do not combine that with Tylenol at the same time.  Consider taking a stool softener if you do end up having to take the hydrocodone.  Follow-up with your doctor to be rechecked.

## 2022-05-31 NOTE — ED Triage Notes (Signed)
Pt states right flank pain that woke him up from his sleep. Pt denies urinary issues.

## 2022-05-31 NOTE — ED Notes (Signed)
Discharge instructions, follow up care, and prescriptions reviewed and explained, pt verbalized understanding. Pt caox4 and ambulatory on d/c.  

## 2022-06-05 ENCOUNTER — Encounter: Payer: Medicare Other | Admitting: *Deleted

## 2022-06-05 DIAGNOSIS — J84112 Idiopathic pulmonary fibrosis: Secondary | ICD-10-CM

## 2022-06-05 DIAGNOSIS — R109 Unspecified abdominal pain: Secondary | ICD-10-CM | POA: Diagnosis not present

## 2022-06-05 DIAGNOSIS — M47816 Spondylosis without myelopathy or radiculopathy, lumbar region: Secondary | ICD-10-CM | POA: Diagnosis not present

## 2022-06-05 DIAGNOSIS — Z006 Encounter for examination for normal comparison and control in clinical research program: Secondary | ICD-10-CM

## 2022-06-07 ENCOUNTER — Ambulatory Visit (INDEPENDENT_AMBULATORY_CARE_PROVIDER_SITE_OTHER): Payer: Medicare Other | Admitting: Internal Medicine

## 2022-06-07 ENCOUNTER — Encounter: Payer: Self-pay | Admitting: Internal Medicine

## 2022-06-07 VITALS — BP 136/82 | HR 71 | Temp 98.2°F | Ht 66.0 in | Wt 158.0 lb

## 2022-06-07 DIAGNOSIS — Z23 Encounter for immunization: Secondary | ICD-10-CM

## 2022-06-07 DIAGNOSIS — Z5181 Encounter for therapeutic drug level monitoring: Secondary | ICD-10-CM | POA: Diagnosis not present

## 2022-06-07 DIAGNOSIS — I2510 Atherosclerotic heart disease of native coronary artery without angina pectoris: Secondary | ICD-10-CM

## 2022-06-07 DIAGNOSIS — J84112 Idiopathic pulmonary fibrosis: Secondary | ICD-10-CM

## 2022-06-07 NOTE — Progress Notes (Signed)
OV 08/09/2020  Subjective:  Patient ID: Reginald Davis, male , DOB: August 20, 1946 , age 76 y.o. , MRN: [id number] , ADDRESS: [place] [address] PCP [name], MD Patient Care Team: [name], MD as PCP - General (Internal Medicine) End, Harrell Gave, MD as PCP - Cardiology (Cardiology) Cardiologist is Dr. Ottie Glazier This Provider for this visit: Treatment Team:  Attending Provider: Brand Males, MD    08/09/2020 -   Chief Complaint  Patient presents with   Consult    COPD, DOE     HPI Reginald Davis 76 y.o. -  retired academic an Therapist, sports for a [employer]  History is given by the patient who also brought in very clearly typed notes about his history.  He says in the 1970s and 1980s while he was busy with his professional life he was a smoker and finally quit smoking in 1997.  His total pack smoking history might be 70 years.  He undergoes regular screening CT scans of the chest.  Most recently March 2021 that reported signs of emphysema and a small upper lobe nodule unchanged over many years and possible interstitial lung disease changes.  He says he was at baseline and doing well but in April 2021 he started noticing shortness of breath.  He was placed on Spiriva which improved his symptoms daily.  After that he felt like significantly improved.  He then was dealing with significant difficult to control blood pressures with Dr. Meda Coffee.  She has increased his dosage of the blood pressure medication.  He was remaining active walking approximately thousand steps daily and 30 minutes of walking 5 more days per week.  Then in September 2020 when he and his wife traveled to [place] and then drove to [place] which is an elevation of 4000-4500 feet.  After that they continue to [facility] at [place] feet.  They got very short of breath.  Even had a fever episode.  His pulse oximetry dropped to 84-85.  He ended up in the emergency room  and was given a diagnosis of COPD exacerbation.  Given doxycycline and prednisone and then he improved.  Post improvement in September and post return to [facility] he has had difficult to control blood pressure but he does have some residual shortness of breath with exertion relieved by rest.  His apple watch has noticed desaturations at night that are transient with pulse ox ranging 85 to 96%.  His resting heart rate at bed is between 54 and 90 at night.  His current walking desaturation test is below.  He dropped only 2 points but he did get tachycardic.  His pulmonary function test show slight reduction in DLCO but otherwise okay  He follows with Dr. Meda Coffee who noticed he has left lower lobe crackles.  Due to persistent symptoms he has been referred to pulmonary clinic.  He wants to get to the bottom of the issues.  His echocardiogram shows grade 1 diastolic dysfunction.  He does suffer from some visceral obesity.   Of note he has upcoming air travel to [place] on August 15, 2020.  He is going to see his granddaughter who is not seen since the onset of the pandemic.  He believes he may not be traveling to high altitudes in [place].   Simple office walk 185 feet x  3 laps goal with forehead probe 08/09/2020   O2 used ra  Number laps completed 3  Comments about pace normal  Resting Pulse Ox/HR 96% and 80/min  Final Pulse Ox/HR 94% and 90/min  Desaturated </= 88% no  Desaturated <= 3% points no  Got Tachycardic >/= 90/min yes  Symptoms at end of test dyspnea  Miscellaneous comments x         PFT Results Latest Ref Rng & Units 07/12/2020  FVC-Pre L 3.36  FVC-Predicted Pre % 88  FVC-Post L 3.41  FVC-Predicted Post % 90  Pre FEV1/FVC % % 72  Post FEV1/FCV % % 74  FEV1-Pre L 2.41  FEV1-Predicted Pre % 88  FEV1-Post L 2.51  DLCO uncorrected ml/min/mmHg 16.88  DLCO UNC% % 73  DLCO corrected ml/min/mmHg 16.88  DLCO COR %Predicted % 73  DLVA Predicted % 80  TLC L 5.39  TLC %  Predicted % 83  RV % Predicted % 65    ADDENDUM: There is a right lower lobe nodule that is seen between vessels in the right lower lobe on image 97 of series 8. This nodule was compared to studies dating back to 2015 and shows no change. It is possible that this represents a lymph node along bronchovascular structures but again this is stable over a 6 year interval, obscured by surrounding vessels and bronchovascular structures.     Electronically Signed   By: Zetta Bills M.D.   On: 11/27/2019 14:45    Signed by Felipa Emory, MD on 11/27/2019  2:48 PM  Narrative & Impression  CLINICAL DATA:  Left upper lobe nodules, history of bronchiectasis and emphysema.   EXAM: CT CHEST WITHOUT CONTRAST   TECHNIQUE: Multidetector CT imaging of the chest was performed following the standard protocol without IV contrast.   COMPARISON:  Coronary CT from 08/26/2018, CT chest from 09/20/2017   FINDINGS: Cardiovascular: Calcified atherosclerotic changes in the thoracic aorta without signs of aneurysm. Limited assessment on noncontrast evaluation. The heart size is normal without pericardial effusion. Coronary artery disease with three-vessel calcification as before.   Mediastinum/Nodes: Thoracic inlet structures are normal. No axillary lymphadenopathy. Small right juxta hilar lymph node, 9 mm unchanged since January of 2019 along with other scattered lymph nodes below CT criteria for pathologic enlargement. Limited assessment of hilar structures due to lack of contrast. Esophagus is mildly patulous with some gas in the lumen.   Lungs/Pleura: Small nodule between bronchovascular structures in the central portion of the right lower lobe (image 97, series 8) 11 x 7 mm, within 1 mm of its size in 2019. No dense consolidation. Mild basilar bronchiectasis. Subpleural reticulation as before with signs of centrilobular emphysema.   Tiny nodules in the left upper lobe are stable.    Upper Abdomen: Incidentally imaged portions of upper abdominal contents are unremarkable. No evidence of acute upper abdominal process.   Musculoskeletal: No chest wall mass. Sclerosis of the right clavicular head is unchanged since 2019. Spinal degenerative changes as before.   IMPRESSION: 1. Small nodule between bronchovascular structures in the central portion of the right lower lobe measuring 11 x 7 mm, within 1 mm of its size in 2019. This nodule is within 1 mm of its size in 2019. This nodule is unchanged since 2015 and this therefore benign. 2. Signs of emphysema and chronic interstitial changes with tiny upper lobe nodules are unchanged. 3. Coronary artery disease with three-vessel calcification.   Aortic Atherosclerosis (ICD10-I70.0) and Emphysema (ICD10-J43.9).   Electronically Signed: By: Zetta Bills M.D. On: 11/26/2019 09:11     08/29/2020- Interim hx Patient  presents today to review testing and discuss starting anti-fiibrotic medication. CT imaging suggestive of probable UIP with progression since 2015, high suspicion towards IPF. He had negative serology. Dr. Chase Caller recommend starting anti-fibrotic, either Esbriet or OFEV. Accompanied by his wife during today's appointment. He is baseline today, shortness of breath and cough are the same as last visit. He was recently in [place] for 10 days, he did not need to take on hand doxycyline or prednisone. He had an overnight oximetry test on12/2/21 which showed <88% 5 hrs 35 mins, start 2L [place]. He has not received oxygen yet, he was contacted by DME company. He is interested in [employer] continuous oxygen concentrator and is willing to pay out of pocket in needed. He reports daytime oxygen saturation runs between 87-92% RA. He is active, gets an average of 8,000 steps a day. He will be changing medicare part D plans to [employer] silver come January 1st 2022.     OV 09/29/2020  Subjective:  Patient ID: Reginald Davis, male ,  DOB: 1946/06/08 , age 36 y.o. , MRN: [id number] , ADDRESS: [place] [address] PCP [name], MD Patient Care Team: [name], MD as PCP - General (Internal Medicine) End, Harrell Gave, MD as PCP - Cardiology (Cardiology)  This Provider for this visit: Treatment Team:  Attending Provider: Brand Males, MD    09/29/2020 -   Chief Complaint  Patient presents with   Follow-up    6 min walk performed 09/22/20.  Pt states he feels like he has been stable since last visit. Pt is wearing 2L at night.  Pt states if he is on an incline, his sats are dropping some below 88% on room air.    IPF dx givien 08/26/20 based on "probable UIP on CT + negative srology, +  age > 60, _ white + male + prior smoker + heart burn/GERD hx-> this is "IPF"  Associated mild emphysema present -started Spiriva  GERD hx  Pirfenidone start this pending January 2022/February 2022  HPI SENA CLOUATRE 76 y.o. -returns for follow-up.  He presents with his wife.  Since his last visit in the interim he saw nurse practitioner.  We gave him a diagnosis of IPF based on probable UIP on CT scan age greater than 100, Caucasian ethnicity, male gender, prior smoker, acid reflux history and negative serology.  He has accepted the diagnosis.  He presents with his wife at this point in time.  They have gone through pirfenidone education and started paperwork.  He is waiting for co-pay approval.  At this point in time he feels stable his symptom score is below.  He is completed a 6-minute walk test but he did not desaturate below 88%.  The focus of this visit is multiple questions in general education and answering questions about idiopathic pulmonary fibrosis  -Overall his cough is improved.  He attributes his cough improvement to omeprazole.  However he wants to switch to pantoprazole.  He did some reading and he said that he is concerned about theoretical effects of omeprazole and pirfenidone plasma levels  therefore he wants to switch to pantoprazole.  I have supported this and will do the prescription.  -He is very interested in clinical trials as a care option.  We discussed concept of clinical trials being voluntary and what care option meant.  He is probably leaning towards later phase trials.  We gave him consent form for PROMEDIOR Phase 3 and Pliant phase 2 studies  but did indicate to him that there is currently a backlog and in addition he will have to be stable on antifibrotic's for a few months before he can enroll.  He is understanding of this.  -He is interested in the patient support group and I have given him the email for a local support group to the pulmonary fibrosis foundation  -He exercises regularly.  However he is interested in joining pulmonary rehabilitation a referral has been made but he is still waiting to hear from them.  I have emailed the director to get a follow-up.  -We discussed the natural history of pulmonary fibrosis.  Especially IPF.  We discussed its progressive disease.  He and his wife wanted to know about prognostic markers.  Currently these are not commercially available.  In particular is interested in [place].  Indicated that in the next few years, she will test might be available to differentiate prognosis but at this time we would follow clinically.  Currently based on current severity and stability predicts future progression.  Also explained viral illnesses can exacerbate diseases  - Lung transplantation: He is interested in this concept.  He wants early referral to [facility] so he gets his options covered.  He knows he has to lose weight.  We discussed common barriers for lung transplantation including psychological Support, social support, financial costs.  He says he does not have any of this.  His BMI is 29 he needs to get to less than 27.  Explained to him nutrition is the way to go on this.  He is also going to do rehabilitation   - We also  discussed his emphysema component being small.  He will continue with Spiriva  = Oxygenation -he is using nocturnal oxygen.  His apple watch shows his oxygen levels to be normal now at night.  He is monitoring this.  He says that when he exerts heavily his oxygen does drop but on the 6-minute walk test he did not drop.  He will pay for a portable oxygen system on his own out-of-pocket.  Therefore we will do an order for this.      CT Chest data  No results found.    OV 11/10/2020  Subjective:  Patient ID: Reginald Davis, male , DOB: [birth year] , age 89 y.o. , MRN: [id number] , ADDRESS: [place] [address] PCP [name], MD Patient Care Team: [name], MD as PCP - General (Internal Medicine) End, Harrell Gave, MD as PCP - Cardiology (Cardiology)  This Provider for this visit: Treatment Team:  Attending Provider: Brand Males, MD    11/10/2020 -   Chief Complaint  Patient presents with   Follow-up    Doing well    IPF dx givien 08/26/20 based on "probable UIP on CT + negative srology, +  age > 64, _ white + male + prior smoker + heart burn/GERD hx-> this is "IPF"  -Last CT scan of the chest December 2021  - Esbriet start date -> Oct 26, 2020   - Pulm rehab Mar 2022   Associated mild emphysema present -started Spiriva    HPI SKYELER SMOLA 76 y.o. -presents with his wife. This is for IPF follow-up. He finally got hold of his pirfenidone. He started this 10/26/2020. So far he is tolerating it well. Earlier this week on 11/08/2020 he started pulmonary rehabilitation. He feels stable. Wife feels he is an under perceiver. On a 6-minute walk test he dropped to  as low as 89%. He had a pulmonary function test at [facility] that showed decline in DLCO. He is worried about this. We walked him again today and his pulse ox did drop to 88%. That could have been a variation in place but clearly this a difference suggesting progression. However he is tolerating  his pirfenidone fine. No side effects as evidenced below in the symptom score. He really wants to start ASAP on clinical trials. He is already met with [facility] for transplant as an option. He had to consent forms one for phase 3 IV infusion PRomedior and another for Phase 2 Pliant Part C. The latter has moved to Part D which is higher dose. He had more questions about this. We went over several components of clinical trials documented below. Explained to him about therapeutic misconception and secondary intent being therapeutic gain. Advised him that the primary intent should always be about drug development and contribution to science. He understood this. Explained to him currently there is supply chain issues with a phase 3 study. However the phase 2 sutdy is active. However he were to wait a few months before he is eligible for either study because he just started on pirfenidone. He understands this. We have given him an updated consent form. PFT [facility] feb [facility]22 FVC 3.55L DLCO 11.02    Telephone call 11/30/20   Nov 2021 -> March 2022 with [place]  - FVC up - dlco down  Compared to [facility] feb 2022  -March 2022 with  [place]  - FVC down  - DLCO up   Overall  - would stay stable  - there is mixed signal - nothing to do for now  - allow anti fibrotic kick in  - be patient   OV 12/21/2020  Subjective:  Patient ID: Reginald Davis, male , DOB: Jul 21, 1946 , age 45 y.o. , MRN: [id number] , ADDRESS: [place] [address] PCP [name], MD Patient Care Team: [name], MD as PCP - General (Internal Medicine) End, Harrell Gave, MD as PCP - Cardiology (Cardiology)  This Provider for this visit: Treatment Team:  Attending Provider: Brand Males, MD    12/21/2020 -   Chief Complaint  Patient presents with   Follow-up    PFT performed 11/25/20.  Pt states he has been doing okay and denies any complaints.     IPF dx givien 08/26/20 based on "probable UIP on CT +  negative srology, +  age > 34, + white + male + prior smoker + heart burn/GERD hx-> this is "IPF"  -Last CT scan of the chest December 2021  - Esbriet start date -> Oct 26, 2020   - Pulm rehab Mar 2022  - Clinical trial education - Mar 2022 [facility] ICF given)  - PFF concept introduction - May 2022   Associated mild emphysema present -started Spiriva  Normal Echo May 2021  HPI Reginald Davis 76 y.o. -returns for follow-up with his wife.  He is now on pirfenidone since mid 01/27/2021 and is tolerating it well.  He reports intentional weight loss.  He has some mild GI side effects that are reflected in the symptom score and this is stable.  He says with a combination of intentional weight loss and rehabilitation and oxygen use [with exertion] his resting heart rate is now in the 60s.  He showed me his apple watch curve about that.  He is pretty excited about that.  He has had  [place] for short of Covid vaccine mRNA.  He is really interested in clinical trials.  He has met with the research coordinator and is scheduled himself for Pliant study visit mid May 2022.  He has read the consent.  He is keeping up with the literature on pulmonary fibrosis.  He is pretty excited about an upcoming IND application approval for the [place] for [employer] BUI [id number].  We will be a study site for this but probably in the fall or the winter 2022.  I told him I do not know much details about this drug as yet.  He says he will start the Pliant study currently.  He wants to know about travel.  He has some prednisone and hand and also antibiotics.  We will go to [place] and [place].  He will take his portable oxygen system with him.  I have approved this travel.  At room air at rest he is fine.  When he exerts himself with treadmill and exercise he uses 2-3 L pulse.  Most recent liver function test was normal.  This was in December 12, 2020.  He is worried about his progression.  Particular [facility] test showed a drop.  He then had pulmonary function test with [place].  I put these results together.  It appears that there is lot of fluctuation particularly with the [facility] test.  But on average it appears his DLCO is declined since he has been with [place] but his FVC is stable.   He is enjoying his rehabilitation.    Results for KEISON, GLENDINNING (MRN [id number]) as of 12/21/2020 11:06  Ref. Range 07/12/2020 11:53 10/24/2020 [facility] 11/25/2020 08:52  FVC-Pre Latest Units: L 3.36 3.55 3.43  FVC-%Pred-Pre Latest Units: % 88 100% 94   Results for ANUP, BRIGHAM (MRN [id number]) as of 12/21/2020 11:06  Ref. Range 07/12/2020 11:53 10/24/2020 [facility] 11/25/2020 08:52  DLCO cor Latest Units: ml/min/mmHg 16.88 11.02 13.45  DLCO cor % pred Latest Units: % 73 49.3 60   PFT   Subjective:  Patient ID: Reginald Davis, male , DOB: 21-Jul-1946 , age 60 y.o. , MRN: [id number] , ADDRESS: [place] [address] PCP [name], MD Patient Care Team: [name], MD as PCP - General (Internal Medicine) Constance Haw, MD as PCP - Electrophysiology (Cardiology) Skeet Latch, MD as PCP - Cardiology (Cardiology)  This Provider for this visit: Treatment Team:  Attending Provider: Brand Males, MD    08/22/2021 -   Chief Complaint  Patient presents with   Follow-up    Pt states he has had diarrhea for the past 6 weeks and is unsure if it is due to his medication.     IPF dx givien 08/26/20 based on "probable UIP on CT + negative srology, +  age > 61, + white + male + prior smoker + heart burn/GERD hx-> this is "IPF"  -Last CT scan of the chest December 2021  - Esbriet start date -> Oct 26, 2020   - Pulm rehab Mar 2022  - Clinical trial education - Mar 2022 [facility] ICF given)     - Pliant study participant Leona Singleton [phone] and was randomized 22Jun2022  - PFF concept introduction - May 2022   Associated mild emphysema present -started Spiriva  Normal Echo May  2021  HPI Reginald Davis 76 y.o. -   IPF: [facility] visit for OPF. On esbrit and also study (IMP  v placebo). Has study  visit#10 in 19Dec, then come back for EOT on 30Jan and EOS on 14Feb and then he is done.  From a respiratory standpoint his symptom scores are slowly actually improved.  His FVC also shows slow improvement over time.  In August 2022 he had a walk test at [facility] and this shows improvement in walking distance.  His weight is stable.  He had recent high-resolution CT chest.  Compared to September 2022 his ILD is stable but compared to remote past there is progression.  Overall he is pleased with the symptoms and stability.  He uses oxygen 2 L at night and 3 L pulsed with exercise and while traveling.  He continues on pirfenidone and study drug.  He should finish the study next month or so.  Rash: Sometime around September 2022 or so he developed punctate rash in his forearms and his back.  He says this still persist but it is stable its not any worse.  He is not overly concerned about this  Main new issue is that since the last 6 weeks he has had diarrhea.  Started as mild but since then is gotten progressive.  He says currently it is severe.  He is having a lot of flatulence and when he has significant amount of flatulence he is having some amount of stool in his underwear.  For the last 3 weeks he is taking align probiotic.  For the last 1 week he is also taking a protein shake but he says there is no sweetener in it.  For the last 1 week he is started Imodium.  After this diarrhea is better.  He still having diarrhea 3-4 times a day.  He is taking Imodium 3-4 times a day.  The stool is definitely more formed but it still worrisome for him.  He says he is cut down on nuts and fruits.  He denies any artificial sweetener.  Did medication review for diarrhea causing drugs: The only new introduction is a study drug and prior to that was the pirfenidone.  However he is on other drugs  chronically that can cause diarrhea and this includes colchicine greater than 20%, Diovan greater than 5% and PPI.  Initially did not complain about pain but when I asked him he said sometimes he has some right lower quadrant discomfort.  There is no fever or bloody stools.  He had recent safety labs on 08/15/2021.  I personally reviewed this and this is normal.     Upcoming travel: He is plan to go to [place] in January 2023.  This is for 1-2 weeks and is to visit his daughter and his only grandchild.  He wants his diarrhea significantly addressed before the trip.  CT Chest data  No results found.    OV 11/28/2021  Subjective:  Patient ID: Reginald Davis, male , DOB: [birth year] , age 61 y.o. , MRN: [id number] , ADDRESS: [place] [address] PCP [name], MD Patient Care Team: [name], MD as PCP - General (Internal Medicine) Constance Haw, MD as PCP - Electrophysiology (Cardiology) Skeet Latch, MD as PCP - Cardiology (Cardiology)  This Provider for this visit: Treatment Team:  Attending Provider: Brand Males, MD    11/28/2021 -   Chief Complaint  Patient presents with   Follow-up    Pt states he has been doing okay since last visit.   HPI DALLON DACOSTA 76 y.o. -returns for follow-up.  Presents with his wife.  He tells me that he is doing well overall.  After coming off the pliant study in February 2023 for 2 weeks he thought he had increased nocturnal desaturations but then this settled out and he is better.  He is intentionally lost weight.  He did go for a lung transplant evaluation at [facility] 11/01/2021.  His 6-minute walking test is improved.  His pulmonary function test also has improved [see below].  He attributes this improvement to being on the research study protocol.  We do not know if he was randomized to placebo or the actual medicine of product.  He believes he was randomized to the actual medicine product.  He did  have some increased diarrhea during the study and he says this is now improved.  He is interested in more studies.  Although the current improvement in diarrhea is also because he is taking Imodium on a scheduled basis.  He continues to use 2 L at night and 3 L with exercise although 6-minute walk test had an improved distance and did not desaturate below 91% and he finished 15 laps at [facility].  He is interested in more clinical trials.  We we discussed a phase 2 oral anticollagen inhibitor study by [employer] sponsor.  He has taken the consent to review.  He is also interested in the future injection study that he came across in the Internet.  It is sponsored by [employer].  Were not a location for the study at this point in time but did indicate to him that we are in communication and do not have much information to share with him about that.  He is going to read the Internet and let us know what study he wants to do.  He continues to have dry skin.  We talked about flaxseed  He has tended travel to [place] in July 2023.  He wants [place] Paxlovid refilled.  He also wants doxycycline and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

## 2022-06-07 NOTE — Patient Instructions (Addendum)
IPF (idiopathic pulmonary fibrosis) (HCC)   - DLCO slow creep down over 1-2 years - recently signed consent for moonscape reearch study   Plan   - cotniue esbriet as before -high dose flu shot 06/07/2022 = will talk to Lauren in research and based on PFT data might have to cancel CT on Monday 06/11/22 if you screen fail  - addendum 6:55 PM = has screen failed due to low Fev/Fvc ratio < 70 - o2 with exercise but keep pulse ox >= 88% - spiro and dlco in 3 months   Follow-up - At 82-monthstandard of care visit with Dr. [name]30 minutes-but after spirometry and DLCO  -Symptom questionnaire and walk test at follow-up  - [facility] if in a research studty

## 2022-06-08 ENCOUNTER — Telehealth: Payer: Self-pay | Admitting: Internal Medicine

## 2022-06-08 NOTE — Telephone Encounter (Signed)
Reginald [facility]  Please let Reginald [facility] -> know that the telmoere test done at [facility] was looked at by Reginald [facility] our genetics person and she feels is normal  Thanks   SIGNATURE    Dr. Brand Males, M.D., F.C.C.P,  Pulmonary and Critical Care Medicine Staff Physician, [place] Director - Interstitial Lung Disease  Program  Medical Director - [place] [facility] [place] at [place], [place], [address]   Pager: [phone], If no answer  -[place] or [place] [phone] Telephone (clinical office): [phone] Telephone (research): [phone]  1:51 PM 06/08/2022

## 2022-06-11 ENCOUNTER — Encounter: Payer: Self-pay | Admitting: Internal Medicine

## 2022-06-11 ENCOUNTER — Ambulatory Visit (HOSPITAL_BASED_OUTPATIENT_CLINIC_OR_DEPARTMENT_OTHER): Admission: RE | Admit: 2022-06-11 | Payer: Medicare Other | Source: Ambulatory Visit

## 2022-06-11 DIAGNOSIS — Z5181 Encounter for therapeutic drug level monitoring: Secondary | ICD-10-CM

## 2022-06-11 DIAGNOSIS — K521 Toxic gastroenteritis and colitis: Secondary | ICD-10-CM

## 2022-06-11 DIAGNOSIS — J84112 Idiopathic pulmonary fibrosis: Secondary | ICD-10-CM

## 2022-06-11 DIAGNOSIS — R399 Unspecified symptoms and signs involving the genitourinary system: Secondary | ICD-10-CM

## 2022-06-11 NOTE — Research (Signed)
TITLE: A TWO-COHORT, PHASE II, MULTICENTER, RANDOMIZED, DOUBLE-BLIND, PARALLEL GROUP, PLACEBO-CONTROLLED STUDY EVALUATING THE EFFICACY AND SAFETY OF VIXARELIMAB COMPARED WITH PLACEBO IN PATIENTS WITH IDIOPATHIC PULMONARY FIBROSIS (COHORT 1) AND IN PATIENTS WITH SYSTEMIC SCLEROSIS ASSOCIATED INTERSTITIAL LUNG DISEASE (COHORT 2)  THIS BLURB is FOR COHORT 1   Protocol #: [id number] NCT: [id number] Sponsor: [place]  Protocol Version: Version 2 Dated 24Dec2022 IB: Version 5.0 Dated 09Sep2022 ICF:  [facility] version [phone], revised 22May23 Main ICF Version [phone],  revised 22May23 Open label version13Feb2023,  Mobile Nursing version [id number]   Study Design:  This is a two-cohort, Phase II, multicenter, randomized, double-blind, parallel-group, placebo-controlled study  Eligible patients within each cohort will be randomized 1:1 to receive [place] injections of vixarelimab 360 mg or placebo Q2W for 26 doses over 52 weeks, followed by a follow-up visit approximately 9 weeks after the final dose.   Mechanism of Action Vixarelimab is a fully human monoclonal antibody (mAb) that targets the cytokine receptor subunit, oncostatin M (OSM) receptor-beta (OSMR?). The OSMR? subunit differentially heterodimerizes to form 2 distinct cytokine receptors: interleukin-31 (IL-31) and OSM. Thus, vixarelimab has the potential to disrupt fibrosis, inflammation pruritus, inflammation, hyperkeratosis, and fibrosis by inhibiting both IL-31 and OSM activities Administration  Vixarelimab 360 mg or matching placebo will be administered Q2W by subcutaneous injection   Key Inclusion Criteria Both Cohorts FVC  >=  45% predicted  FEV1/FVC ratio >= 0.70  DLCO  30% -  90%  Minimum 6MWT distance of 150 meters with maximum use of 6 L/min at sea-level and up to 8 L/min at altitude ( 5000 feet [1524 meters] above sea level) of supplemental oxygen while maintaining oxygen saturation of  83% during the 6MWT during  screening Cohort 1 Age 62-85 years,IPF or IPF (likely) per the 2022 ATS/ERS/JRS/ALAT Clinical Practice Guideline HRCT pattern consistent with the diagnosis of IPF ,For patients receiving pirfenidone or nintedanib treatment for IPF: treatment for  3 months with a stable dose for  4 weeks prior to screening  For patients not currently receiving nintedanib or pirfenidone treatment (i.e., either treatment naive or have previously taken and discontinued): discontinuation of such treatment  4 weeks prior to screening and during screening with no plans to start or restart therapy during the study period   Key Exclusion Criteria for BOTH cohorts % predicted FVC value improvement in the 32-month prior to screening  Known post-bronchodilator response in FEV1 and/or FVC (defined as an increase by 12% and 200 mL) Resting oxygen saturation of 89% using up to 4 L/min of supplemental oxygen at sea level and up to 6 L/min at altitude (5000 feet [[9826meters] above sea level) during screening. Participation in or planned initiation of a new cardiopulmonary rehabilitation program during the screening period. Maintenance phase of the rehabilitation program are eligible. Inability to refrain from the use of the following  Short acting bronchodilators with 4 hours before PFT, DLCO,and 6MWT Once daily or long acting bronchodilators with 24 hours before PFT, DLCO and 6MWT Twice daily, long acting bronchodilators 12 hours before PFT DLCO and 6MWT.  Receipt of systemic (oral, IV, IM, or IA) corticosteroids equivalent to prednisone 10 mg/day or equivalent within 2 weeks prior to screening  Receipt of nintedanib in combination with pirfenidone.  History of Lung transplant Receipt of an investigational drug with in 4 weeks or 5 half lives whichever is longer prior to screening History of severe allergic reaction or anaphylactic reaction to a biologic agent Acute respiratory or systemic bacterial, viral or  fungal infection  either during screening or prior to screening not successfully resolved by 4 weeks prior to screening visit.  Co-existing acute or chronic medical condition that in the investigators opinion would substantially limit the ability to comply with study requirements or may influence any of the safety or efficacy assessments included in the study  Class IV [place] Heart Association chronic heart failure or historical evidence of left ventricular ejection fraction  35%  History or presence of an abnormal ECG that is deemed clinically significant by the investigator, including complete left bundle branch block or second- or third-degree atrioventricular heart block during screening Clinically significant abnormality on laboratory tests during screening (hematology, serum chemistry, and urinalysis) that in the opinion of the investigator may pose an additional risk in administering study drug to the patient. QT interval corrected through use of Fridericia's formula (QTcF) 450 ms if patient is male or QTcF 470 ms if patient is male  For male or male patients with QRS 120: QTcF 480 ms Presence of pulmonary hypertension requiring treatment or, in the investigator's opinion, would substantially limit the ability to comply with study requirements or may influence any of the safety or efficacy assessments included in the study Major surgery (including joint surgery) within 8 weeks prior to screening or planned major surgery within the study period.  Use of any of the following treatments with in 4 weeks prior to screening Treatment with immunoglobulin or blood products Treatment with any live or attenuated vaccine History of smoking (including cigarette, cannabis, cigar, pipe, and vaping) within 3 months prior to screening History of alcohol or substance use disorder within 2 years prior to screening or known or suspected active alcohol or substance-use disorder Positive Hepatitis C virus antibody test results  accompanied by positive HCV RNA test at screening Unacceptable test results of HBsAG, HBaAb, HBcAb at screening.  Known immunodeficiency including, but not limited to I+HIV infection with CD4+ T cell count below normal rand 6 months prior to screening Known evidence of active or untreated latent TB History of malignancy within the 5 years prior to screening, with the exception of basal cell or squamous cell skin neoplasms- a malignant diagnosis or condition that occurred more than 5 years prior to screening and any basal cell or squamous cell neoplasm must be considered cured, inactive and not under treatment.  Cohort 1 Evidence of other known causes of ILD (e.g., domestic and occupational environmental exposures, drug toxicity, or connective-tissue disease) Emphysema present on  50% of the HRCT, or the extent of emphysema is greater than the extent of fibrosis, according to central review of the HRCT Receiving strong inhibitor or inducer of CYP1A2 in patients taking pirfenidone Receiving potent inhibitor or inducer of P-gp in patients taking nintedanib Received cytotoxic, immunosuppressive, cytokine modulating, or receptor antagonist agents (including but not limited to methotrexate, azathioprine, mycophenolate mofetil, cyclophosphamide, cyclosporine, or other corticosteroid- sparing agent) within 4 weeks of screening  Prohibited Therapies Patients requiring a prohibited therapy will be discontinued from study treatment and will undergo follow- up assessments.   Applicable for Both Cohorts:  All investigational therapies, within 4 weeks (or 5 half-lives, whichever is longer) before screening and during study participation  Any newly approved anti-fibrotic therapy that becomes available during the study  High-dose corticosteroids (equivalent to prednisone > 10 mg daily) unless clinically indicated  as per protocol  Combined treatment with pirfenidone and nintedanib  In the case of a clinically  significant deterioration of SSc or IPF, initiation of additional  approved therapy is allowed at the discretion of the investigator, if it is deemed necessary on clinical grounds; the Medical Monitor is available to discuss as needed. Detailed information following such events should be recorded in the eCRF.  In addition, cardiopulmonary rehabilitation programs should not be initiated during the study.  Cohort 1  Strong inhibitors or inducers of CYP1A2 in patients taking pirfenidone  Strong inhibitors or inducers of P-gp in patients taking nintedanib  Cytotoxic, immunosuppressive, cytokine modulating, or receptor antagonist agents (including but not limited to methotrexate, azathioprine, mycophenolate mofetil, cyclophosphamide, cyclosporine, or other corticosteroid-sparing agent) within 4 weeks prior to baseline (Dosing Day 1) and during the study   Safety/Adverse reactions Vixarelimab is a monoclonal antibody which carries a potential risk of hypersensitivity reactions and anaphylaxis (allergic reactions) or hypersensitivity-like reactions (pseudoallergic reactions). Hypersensitivity reactions will be closely monitored during the study.    Clinical Trials  Study [id number], Part 1 and Part 3 and Part 4 In Parts 1 and 3 of Study [id number], serial serum samples were collected predose and up to 90 days post dose. The final PK population included 56 subjects: 36 healthy subjects from Part 3 and 20 subjects with AD from Part 1.   There were no deaths, SAEs, infusion reaction-related treatment-emergent adverse events (TEAEs), injection site reactions (ISRs), or TEAEs leading to study drug discontinuation. No subject had a confirmed positive test for ADAs.   TEAE Pooled Vixarelimab IV (N=16) Vixarelimab 1.5 mg/kg [place] (N=4)   Headache 4 (25) 0  Dermatitis Atopic  3 (18) 0  Dizziness 2(12.5) 1 (25)  Upper Respiratory Tract Infection  2(12.5) 1 (25)  Decrease Appetite 1 (6.3) 1 (25)  Influenza  1 (6.3) 0  Nitrate Urine  1 (6.3) 0  Bronchitis 0 1 (25)  Skin Burning Sensation  0 1 (25)  Skin Exfoliation  0 1 (25)  Chills 0 1 (25)  Musculoskeletal pain  0 1 (25)    Part 3  TEAE Pooled Vixarelimab IV (N=16) Vixarelimab 1.5 mg/kg [place] (N=4)   Upper Respiratory Tract Infection  3 (12.5) 0  Headache 2 (8.3) 0  Myalgia  1 (4.2) 0  Seasonal Allergy  0 0  influenza 0 1 (7.7)  Urinary Tract Infection 0 1 (7.7)  Nasal Congestion 0 1 (7.7)  Toothache 0 1 (7.7)  Back pain 0 1 (7.7)  Anemia 0 1 (7.7)  Pruritus 0 1 (7.7)  Flushing 0 1 (7.7)   Part 4 43 subjects with AD (21 received vixarelimab; 22 placebo) no deaths or SAEs. TEAEs leading to study drug discontinuation were reported only in the vixarelimab group (14.3% of subjects): AD flares for 2 subjects (both unrelated to study drug) and eczema impetiginous for 1 subject (not AD flare-related, possibly related to study drug). TEAEs leading to interruption of dosing were reported for 9.5% of subjects in the vixarelimab group (mild, unrelated TEAEs of AD flare and facial cellulitis in 1 subject and moderate, possibly related, worsening of AD in 1 subject) and 4.5% of subjects in the placebo group (moderate, unrelated influenza in 1 subject).  TEAE Vixarelimab (N=21)  Dermatitis atopic 12 (57.1)  Upper respiratory tract infection 5 (23.8)  Headache 4 (19.0)  Viral Upper Respiratory Tract Infection 4 (03.5)  Folliculitis  3 (00.9)  Arthralgia 3 (14.3)  Injection Site reaction 2 (9.5)  Oropharyngeal pain 2 (9.5)  Dizziness 2 (9.5)  Cellulitis 2 (9.5)  Thrombocytopenia 2 (9.5)  Insomnia 2 (9.5)  Injection site pruritus 2 (9.5)  Impetigo 2 (9.5)  Skin Infection 2 (9.5)  Dermatitis contact 2 (9.5)  Nasal Congestion 0  Nausea 0   Study [id number] Phase 2a Portion In the Phase 2a portion of the study, PK parameters were derived from 23 PN subjects. Following a 720 mg loading dose [place], the median time at maximum  concentration was 6.25 days. Following weekly [place] administration of vixarelimab for 8 weeks, the mean Cmax and AUC0?? increased indicating low-moderate drug accumulation. PK data demonstrated a long-term persistence of drug in plasma in most subjects as 81% of subjects still had plasma drug concentrations above the estimated effective drug concentration 8 weeks after the end of the Treatment Period.  TEAE Vixarelimab a (N=23) For each treatment group, data for subjects treated for 8 weeks and subjects treated for 16 weeks are combined.  URTI  5 (21.7%0  Nasopharyngitis 3 (13%)  Headache 3 (13%)  Eczema Nummular 2 (8.7%)  Urticaria 2 (8.7%)  Procedural Headache 2 (8.7%)  UTI 0  Skin Burning Sensation 0  Study [id number] Following weekly [place] administration of vixarelimab for 8 weeks, the mean Cmax and AUC0-? increased to 190 ?g/mL (%CV: 36.5) and 1290 day??g/mL (%CV: 36.3), indicating low-moderate drug accumulation At the end of study (Week 18), the mean Cmax and AUC0-? were 21.5 ?g/mL and 127 day??g/mL with high variability (%CV >100).   TEAE Vixarelimab (N = 39)  Upper respiratory tract infection  2 (5.1)  Influenza 1 (2.6)  Sinusitis 1 (2.6)  Urinary Tract Infection 1 (2.6)  Dermatitis Contact 1 (2.6)  Pruritus 2 (5.1)  Hemoglobin decreased 0  Back pain 1 (2.6)  Cough  1 (2.6)   Overall, vixarelimab has been well tolerated with an acceptable safety profile in healthy volunteers and in patients with AD, PN, and other chronic pruritic conditions. No deaths occurred in any studies. One possibly related serious adverse event of angioedema was reported in a patient with chronic idiopathic pruritus, who recovered with antihistamine and corticosteroid treatment without hospitalization.  [facility] [place] Coordinator note :   This visit for [name] with DOB: [birth year] on 09/26/2023for the above protocol is Visit/Encounter # Screening and is for  purpose of research.   Protocol Version: Version 2 Dated 24Dec2022 IB: Version 5.0 Dated 09Sep2022 ICF:  [facility] version [phone], revised 22May23 Main ICF Version [phone],  revised 22May23 Open label version13Feb2023,  Mobile Nursing version [phone]  Subject expressed continued interest and consent in continuing as a study subject. Subject confirmed that there was no change in contact information (e.g. address, telephone, email). Subject thanked for participation in research and contribution to science.  In this visit 06/05/2022 the subject will be evaluated by Principal Investigator  This research coordinator has verified that the above investigator is up to date with his/her training logs.   The Subject was informed that the PI continues to have oversight of the subject's visits and course  through relevant discussions, reviews and also specifically of this visit by routing of this note to the PI.  Subject signed consent and proceeded with the assessments for the screening visit as mentioned in the protocol.  Subject did not meet criteria for the FEV1/FVC ration >0.7.  Per the monitor he was not allowed to come back and repeat the spirometry.  This was documented as a screen fail.      Signed by  Jaye Beagle RN/ MSN/MBA  Clinical Research Coordinator / Nurse [facility]  [place], [place] 2:12 PM 06/11/2022

## 2022-06-11 NOTE — Telephone Encounter (Signed)
Stop esbriet for 1 week -> then do 2 pills tid for 1 week -> then go back to 3 pills tid and we can see what happens with diarrhea

## 2022-06-11 NOTE — Telephone Encounter (Signed)
MR, please see mychart message from pt and advise.

## 2022-06-11 NOTE — Telephone Encounter (Signed)
Called and spoke with pt letting him know the info per MR and he verbalized understanding. Nothing further needed. ?

## 2022-06-18 ENCOUNTER — Telehealth: Payer: Self-pay | Admitting: Internal Medicine

## 2022-06-18 ENCOUNTER — Other Ambulatory Visit: Payer: Medicare Other

## 2022-06-18 ENCOUNTER — Other Ambulatory Visit (INDEPENDENT_AMBULATORY_CARE_PROVIDER_SITE_OTHER): Payer: Medicare Other

## 2022-06-18 DIAGNOSIS — J84112 Idiopathic pulmonary fibrosis: Secondary | ICD-10-CM | POA: Diagnosis not present

## 2022-06-18 DIAGNOSIS — K521 Toxic gastroenteritis and colitis: Secondary | ICD-10-CM

## 2022-06-18 DIAGNOSIS — Z5181 Encounter for therapeutic drug level monitoring: Secondary | ICD-10-CM

## 2022-06-18 DIAGNOSIS — R399 Unspecified symptoms and signs involving the genitourinary system: Secondary | ICD-10-CM | POA: Diagnosis not present

## 2022-06-18 DIAGNOSIS — R197 Diarrhea, unspecified: Secondary | ICD-10-CM | POA: Diagnosis not present

## 2022-06-18 LAB — HEPATIC FUNCTION PANEL
ALT: 18 U/L (ref 0–53)
AST: 19 U/L (ref 0–37)
Albumin: 3.8 g/dL (ref 3.5–5.2)
Alkaline Phosphatase: 44 U/L (ref 39–117)
Bilirubin, Direct: 0.1 mg/dL (ref 0.0–0.3)
Total Bilirubin: 0.2 mg/dL (ref 0.2–1.2)
Total Protein: 6.6 g/dL (ref 6.0–8.3)

## 2022-06-18 LAB — CBC WITH DIFFERENTIAL/PLATELET
Basophils Absolute: 0.1 10*3/uL (ref 0.0–0.1)
Basophils Relative: 0.8 % (ref 0.0–3.0)
Eosinophils Absolute: 0.2 10*3/uL (ref 0.0–0.7)
Eosinophils Relative: 2.6 % (ref 0.0–5.0)
HCT: 37.6 % — ABNORMAL LOW (ref 39.0–52.0)
Hemoglobin: 12.7 g/dL — ABNORMAL LOW (ref 13.0–17.0)
Lymphocytes Relative: 32.8 % (ref 12.0–46.0)
Lymphs Abs: 2.4 10*3/uL (ref 0.7–4.0)
MCHC: 33.9 g/dL (ref 30.0–36.0)
MCV: 100.9 fl — ABNORMAL HIGH (ref 78.0–100.0)
Monocytes Absolute: 1 10*3/uL (ref 0.1–1.0)
Monocytes Relative: 13.5 % — ABNORMAL HIGH (ref 3.0–12.0)
Neutro Abs: 3.6 10*3/uL (ref 1.4–7.7)
Neutrophils Relative %: 50.3 % (ref 43.0–77.0)
Platelets: 338 10*3/uL (ref 150.0–400.0)
RBC: 3.73 Mil/uL — ABNORMAL LOW (ref 4.22–5.81)
RDW: 13.2 % (ref 11.5–15.5)
WBC: 7.2 10*3/uL (ref 4.0–10.5)

## 2022-06-18 LAB — URINALYSIS
Bilirubin Urine: NEGATIVE
Hgb urine dipstick: NEGATIVE
Ketones, ur: NEGATIVE
Leukocytes,Ua: NEGATIVE
Nitrite: NEGATIVE
Specific Gravity, Urine: 1.01 (ref 1.000–1.030)
Total Protein, Urine: NEGATIVE
Urine Glucose: NEGATIVE
Urobilinogen, UA: 0.2 (ref 0.0–1.0)
pH: 5.5 (ref 5.0–8.0)

## 2022-06-18 LAB — BASIC METABOLIC PANEL
BUN: 15 mg/dL (ref 6–23)
CO2: 23 mEq/L (ref 19–32)
Calcium: 8.7 mg/dL (ref 8.4–10.5)
Chloride: 110 mEq/L (ref 96–112)
Creatinine, Ser: 0.81 mg/dL (ref 0.40–1.50)
GFR: 85.93 mL/min (ref >60.00)
Glucose, Bld: 112 mg/dL — ABNORMAL HIGH (ref 70–99)
Potassium: 3.5 mEq/L (ref 3.5–5.1)
Sodium: 141 mEq/L (ref 135–145)

## 2022-06-18 LAB — PHOSPHORUS: Phosphorus: 3.2 mg/dL (ref 2.3–4.6)

## 2022-06-18 LAB — MAGNESIUM: Magnesium: 1.9 mg/dL (ref 1.5–2.5)

## 2022-06-18 NOTE — Telephone Encounter (Signed)
If he is still having diarrhea on 06/18/2022 after stopping pirfenidone on 06/12/2022 then I suspect something else is going on  Plan - Continue to hold pirfenidone until further notice - Check urine analysis and urine culture [sometimes UTI can masquerade as diarrhea] - Check stool ova and parasite and stool GI PCR panel -Also stop fish oil -Recheck CBC and be met and LFT and mag and Phos

## 2022-06-18 NOTE — Telephone Encounter (Signed)
MR, please see recent mychart message sent by pt and advise.

## 2022-06-18 NOTE — Telephone Encounter (Signed)
Please refer to [employer].

## 2022-06-19 ENCOUNTER — Telehealth: Payer: Self-pay | Admitting: Internal Medicine

## 2022-06-19 DIAGNOSIS — Z23 Encounter for immunization: Secondary | ICD-10-CM | POA: Diagnosis not present

## 2022-06-19 LAB — URINE CULTURE
MICRO NUMBER:: 14026443
Result:: NO GROWTH
SPECIMEN QUALITY:: ADEQUATE

## 2022-06-19 NOTE — Telephone Encounter (Signed)
    His hemoglobin is dropped below 13 g% for the first time.  His MCV is now greater than 100 other than that his lab results are normal particularly blood in urine.  His stool test was negative in December 2022.  Updated stool test and urine culture still pending.  Plan - Await stool studies - If diarrhea continues to persist despite stopping pirfenidone he needs to contact PCP -Keep [place] in the loop.  Minimum pirfenidone hold is at least 2 weeks -Ensure is not taking fish oil -Also stop Protonix and instead take an H2 blocker such as famotidine over-the-counter for the next 2 weeks

## 2022-06-20 LAB — GI PROFILE, STOOL, PCR

## 2022-06-20 NOTE — Telephone Encounter (Signed)
Mychart message sent to patient.

## 2022-06-21 ENCOUNTER — Telehealth: Payer: Self-pay | Admitting: Internal Medicine

## 2022-06-21 NOTE — Telephone Encounter (Signed)
Mychart message sent by pt: Reginald Davis  You 1 hour ago (10:18 AM)    Raquel Sarna, will you please ask Dr. Alfonso Patten if there is a Retail buyer in his circle who is knowledgeable about perfinidone and patients in my situation?    MR, please advise.

## 2022-06-21 NOTE — Telephone Encounter (Signed)
Mychart message sent to pt of results. Nothing further needed.

## 2022-06-21 NOTE — Telephone Encounter (Signed)
[place] studies are normal  Plan  - see message from 06/19/22 - keep that plan for patient

## 2022-06-25 LAB — OVA AND PARASITE EXAMINATION
CONCENTRATE RESULT:: NONE SEEN
MICRO NUMBER:: 14025515
SPECIMEN QUALITY:: ADEQUATE
TRICHROME RESULT:: NONE SEEN

## 2022-06-26 DIAGNOSIS — R109 Unspecified abdominal pain: Secondary | ICD-10-CM | POA: Diagnosis not present

## 2022-06-26 DIAGNOSIS — M545 Low back pain, unspecified: Secondary | ICD-10-CM | POA: Diagnosis not present

## 2022-06-27 DIAGNOSIS — J8410 Pulmonary fibrosis, unspecified: Secondary | ICD-10-CM | POA: Diagnosis not present

## 2022-06-27 DIAGNOSIS — M109 Gout, unspecified: Secondary | ICD-10-CM | POA: Diagnosis not present

## 2022-06-27 DIAGNOSIS — I2583 Coronary atherosclerosis due to lipid rich plaque: Secondary | ICD-10-CM | POA: Diagnosis not present

## 2022-06-27 DIAGNOSIS — R7989 Other specified abnormal findings of blood chemistry: Secondary | ICD-10-CM | POA: Diagnosis not present

## 2022-06-27 DIAGNOSIS — J9611 Chronic respiratory failure with hypoxia: Secondary | ICD-10-CM | POA: Diagnosis not present

## 2022-06-27 DIAGNOSIS — R197 Diarrhea, unspecified: Secondary | ICD-10-CM | POA: Diagnosis not present

## 2022-06-27 DIAGNOSIS — E785 Hyperlipidemia, unspecified: Secondary | ICD-10-CM | POA: Diagnosis not present

## 2022-06-28 DIAGNOSIS — R197 Diarrhea, unspecified: Secondary | ICD-10-CM | POA: Diagnosis not present

## 2022-06-28 DIAGNOSIS — E785 Hyperlipidemia, unspecified: Secondary | ICD-10-CM | POA: Diagnosis not present

## 2022-06-28 NOTE — Telephone Encounter (Signed)
I have been in touch via email with the patient.  He has seen primary care physician and a referral to [place] has been placed nothing further needed for this thread and I am closing it.

## 2022-07-02 ENCOUNTER — Ambulatory Visit (INDEPENDENT_AMBULATORY_CARE_PROVIDER_SITE_OTHER): Payer: Medicare Other | Admitting: Cardiovascular Disease

## 2022-07-02 ENCOUNTER — Encounter (HOSPITAL_BASED_OUTPATIENT_CLINIC_OR_DEPARTMENT_OTHER): Payer: Self-pay | Admitting: Cardiovascular Disease

## 2022-07-02 ENCOUNTER — Encounter (HOSPITAL_BASED_OUTPATIENT_CLINIC_OR_DEPARTMENT_OTHER): Payer: Self-pay

## 2022-07-02 DIAGNOSIS — I10 Essential (primary) hypertension: Secondary | ICD-10-CM | POA: Diagnosis not present

## 2022-07-02 DIAGNOSIS — I2510 Atherosclerotic heart disease of native coronary artery without angina pectoris: Secondary | ICD-10-CM | POA: Diagnosis not present

## 2022-07-02 DIAGNOSIS — E7800 Pure hypercholesterolemia, unspecified: Secondary | ICD-10-CM | POA: Diagnosis not present

## 2022-07-02 NOTE — Assessment & Plan Note (Signed)
Blood pressure is elevated in the office but it has been in the 100s-130s/60-70s.  Mostly in the 120s. Continue valsartan.  He was congratulated on his wonderful exercise regimen and diet.

## 2022-07-02 NOTE — Assessment & Plan Note (Signed)
Non-obstructive CAD.  He is exercising regularly and has no angina l symptoms.  Continue aspirin and atorvastatin.
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
## 2022-07-02 NOTE — Patient Instructions (Addendum)
Medication Instructions:  Your physician recommends that you continue on your current medications as directed. Please refer to the Current Medication list given to you today.  *If you need a refill on your cardiac medications before your next appointment, please call your pharmacy*  Lab Work: NONE  Testing/Procedures: NONE  Follow-Up: At [facility], you and your health needs are our priority.  As part of our continuing mission to provide you with exceptional heart care, we have created designated Provider Care Teams.  These Care Teams include your primary Cardiologist (physician) and Advanced Practice Providers (APPs -  Physician Assistants and Nurse Practitioners) who all work together to provide you with the care you need, when you need it.  We recommend signing up for the patient portal called "MyChart".  Sign up information is provided on this After Visit Summary.  MyChart is used to connect with patients for Virtual Visits (Telemedicine).  Patients are able to view lab/test results, encounter notes, upcoming appointments, etc.  Non-urgent messages can be sent to your provider as well.   To learn more about what you can do with MyChart, go to [email or web address]://[email or web address].com.    Your next appointment:   12 month(s)  The format for your next appointment:   In Person  Provider:   Tiffany Twain, MD     

## 2022-07-02 NOTE — Assessment & Plan Note (Signed)
Lipids are very well controlled on atorvastatin and Praluent.  LDL goal is less than 70 and it was 44 on 12/2021.

## 2022-07-05 ENCOUNTER — Telehealth: Payer: Self-pay | Admitting: Internal Medicine

## 2022-07-05 DIAGNOSIS — R197 Diarrhea, unspecified: Secondary | ICD-10-CM

## 2022-07-05 NOTE — Telephone Encounter (Signed)
Called and spoke with pt to see if he had a GI appt scheduled and pt said not yet. Asked if a referral had been placed and he stated that he thought his PCP had placed a referral but stated that it was about 2 weeks ago and he hadn't heard anything yet. Stated to pt that we would go ahead and place an urgent GI referral to see if that would also help speed things along and he verbalized understanding. Referral placed.nothing further needed.

## 2022-07-05 NOTE — Telephone Encounter (Signed)
Reginald Davis  His diarrhea continues despite being off pirfenidone.  He has a GI consult pending but I do not see an appointment  Plan - Please ask him if he has a GI appointment with [facility] or Dr. Collene Mares because I do not see one with [facility].  If not I can try to get him with one of them sooner - Also see if he is back on pirfenidone ?  And if so when was the start date

## 2022-07-11 DIAGNOSIS — R109 Unspecified abdominal pain: Secondary | ICD-10-CM | POA: Diagnosis not present

## 2022-07-11 DIAGNOSIS — M545 Low back pain, unspecified: Secondary | ICD-10-CM | POA: Diagnosis not present

## 2022-07-16 ENCOUNTER — Ambulatory Visit (INDEPENDENT_AMBULATORY_CARE_PROVIDER_SITE_OTHER): Payer: Medicare Other

## 2022-07-16 DIAGNOSIS — I442 Atrioventricular block, complete: Secondary | ICD-10-CM | POA: Diagnosis not present

## 2022-07-16 DIAGNOSIS — Z950 Presence of cardiac pacemaker: Secondary | ICD-10-CM | POA: Diagnosis not present

## 2022-07-17 ENCOUNTER — Telehealth: Payer: Self-pay | Admitting: Internal Medicine

## 2022-07-17 DIAGNOSIS — Z006 Encounter for examination for normal comparison and control in clinical research program: Secondary | ICD-10-CM

## 2022-07-17 DIAGNOSIS — R197 Diarrhea, unspecified: Secondary | ICD-10-CM

## 2022-07-17 LAB — CUP PACEART REMOTE DEVICE CHECK
Battery Remaining Longevity: 113 mo
Battery Remaining Percentage: 90 %
Battery Voltage: 3.01 V
Brady Statistic AP VP Percent: 1 %
Brady Statistic AP VS Percent: 1 %
Brady Statistic AS VP Percent: 96 %
Brady Statistic AS VS Percent: 2.5 %
Brady Statistic RA Percent Paced: 1.3 %
Brady Statistic RV Percent Paced: 97 %
Date Time Interrogation Session: 20231106010013
Implantable Lead Connection Status: 753985
Implantable Lead Connection Status: 753985
Implantable Lead Implant Date: 20220427
Implantable Lead Implant Date: 20220427
Implantable Lead Location: 753859
Implantable Lead Location: 753860
Implantable Pulse Generator Implant Date: 20220427
Lead Channel Impedance Value: 450 Ohm
Lead Channel Impedance Value: 450 Ohm
Lead Channel Pacing Threshold Amplitude: 0.875 V
Lead Channel Pacing Threshold Amplitude: 1 V
Lead Channel Pacing Threshold Pulse Width: 0.5 ms
Lead Channel Pacing Threshold Pulse Width: 0.5 ms
Lead Channel Sensing Intrinsic Amplitude: 12 mV
Lead Channel Sensing Intrinsic Amplitude: 4.8 mV
Lead Channel Setting Pacing Amplitude: 1.125
Lead Channel Setting Pacing Amplitude: 2 V
Lead Channel Setting Pacing Pulse Width: 0.5 ms
Lead Channel Setting Sensing Sensitivity: 2 mV
Pulse Gen Model: 2272
Pulse Gen Serial Number: 3920271

## 2022-07-17 NOTE — Telephone Encounter (Signed)
Haa diarrhea despite being off esbriet. I d/w Dr Benson Norway who will see him ASAP but his office wants a referral. Can you please do a urgent referral to Dr Carol Ada please  tHanks    SIGNATURE    Dr. Brand Males, M.D., F.C.C.P,  Pulmonary and Critical Care Medicine Staff Physician, [place] Director - Interstitial Lung Disease  Program  Medical Director - [facility] [place], [place], [address]   Pager: [phone], If no answer  -[place] or [place] [phone] Telephone (clinical office): [phone] Telephone (research): [phone]  9:05 AM 07/17/2022

## 2022-07-17 NOTE — Telephone Encounter (Signed)
Referral placed.   Nothing further needed at this time.

## 2022-07-23 DIAGNOSIS — R7989 Other specified abnormal findings of blood chemistry: Secondary | ICD-10-CM | POA: Diagnosis not present

## 2022-07-23 DIAGNOSIS — J479 Bronchiectasis, uncomplicated: Secondary | ICD-10-CM | POA: Diagnosis not present

## 2022-07-23 DIAGNOSIS — R197 Diarrhea, unspecified: Secondary | ICD-10-CM | POA: Diagnosis not present

## 2022-07-23 DIAGNOSIS — I2583 Coronary atherosclerosis due to lipid rich plaque: Secondary | ICD-10-CM | POA: Diagnosis not present

## 2022-07-23 DIAGNOSIS — M109 Gout, unspecified: Secondary | ICD-10-CM | POA: Diagnosis not present

## 2022-07-23 DIAGNOSIS — J439 Emphysema, unspecified: Secondary | ICD-10-CM | POA: Diagnosis not present

## 2022-07-23 DIAGNOSIS — E785 Hyperlipidemia, unspecified: Secondary | ICD-10-CM | POA: Diagnosis not present

## 2022-07-23 DIAGNOSIS — I447 Left bundle-branch block, unspecified: Secondary | ICD-10-CM | POA: Diagnosis not present

## 2022-07-23 DIAGNOSIS — R7301 Impaired fasting glucose: Secondary | ICD-10-CM | POA: Diagnosis not present

## 2022-07-23 DIAGNOSIS — J9611 Chronic respiratory failure with hypoxia: Secondary | ICD-10-CM | POA: Diagnosis not present

## 2022-07-23 DIAGNOSIS — J8410 Pulmonary fibrosis, unspecified: Secondary | ICD-10-CM | POA: Diagnosis not present

## 2022-07-25 DIAGNOSIS — R109 Unspecified abdominal pain: Secondary | ICD-10-CM | POA: Diagnosis not present

## 2022-07-25 DIAGNOSIS — R143 Flatulence: Secondary | ICD-10-CM | POA: Diagnosis not present

## 2022-07-25 DIAGNOSIS — R197 Diarrhea, unspecified: Secondary | ICD-10-CM | POA: Diagnosis not present

## 2022-07-25 DIAGNOSIS — J84112 Idiopathic pulmonary fibrosis: Secondary | ICD-10-CM | POA: Diagnosis not present

## 2022-07-25 DIAGNOSIS — M545 Low back pain, unspecified: Secondary | ICD-10-CM | POA: Diagnosis not present

## 2022-08-07 ENCOUNTER — Other Ambulatory Visit: Payer: Self-pay | Admitting: Pharmacist

## 2022-08-07 DIAGNOSIS — J84112 Idiopathic pulmonary fibrosis: Secondary | ICD-10-CM

## 2022-08-07 MED ORDER — PIRFENIDONE 801 MG PO TABS: 1.0000 | ORAL_TABLET | Freq: Three times a day (TID) | ORAL | 1 refills | Status: DC

## 2022-08-07 NOTE — Telephone Encounter (Signed)
Received VM from patient requesting refill for Esbriet. Refill sent for ESBRIET to [facility], dispensing) for [place]: [phone] . I called patient and he states he re-tirtaed up to 3 tabs three times daily about 2 weeks ago and is tolerating without any issue thus far.  Dose: 801 mg three times daily  Last OV: 06/07/22 Provider: Dr. Chase Caller  Next OV: 08/27/22  CMP on 06/18/22 wnl  Jennye Boroughs Wilhemina Bonito, PharmD, MPH, BCPS Clinical Pharmacist (Rheumatology and Pulmonology)

## 2022-08-17 ENCOUNTER — Telehealth: Payer: Self-pay | Admitting: Pharmacist

## 2022-08-17 ENCOUNTER — Encounter: Payer: Self-pay | Admitting: Pharmacist

## 2022-08-17 MED ORDER — ESBRIET 267 MG PO TABS: 801.0000 mg | ORAL_TABLET | Freq: Three times a day (TID) | ORAL | 1 refills | Status: DC

## 2022-08-17 NOTE — Progress Notes (Signed)
Remote pacemaker transmission.   

## 2022-08-17 NOTE — Telephone Encounter (Signed)
Patient left VM and stated he had a few bouts of diarrhea and is interestd in going back to the '267mg'$  tablets until side effects remain stable.  He states he has a shipment scheduled for '801mg'$  tablets for next week.  MyChart message sent to patient.  Knox Saliva, PharmD, MPH, BCPS, CPP Clinical Pharmacist (Rheumatology and Pulmonology)

## 2022-08-21 DIAGNOSIS — R109 Unspecified abdominal pain: Secondary | ICD-10-CM | POA: Diagnosis not present

## 2022-08-21 DIAGNOSIS — M545 Low back pain, unspecified: Secondary | ICD-10-CM | POA: Diagnosis not present

## 2022-08-22 ENCOUNTER — Ambulatory Visit (INDEPENDENT_AMBULATORY_CARE_PROVIDER_SITE_OTHER): Payer: Medicare Other | Admitting: Internal Medicine

## 2022-08-22 DIAGNOSIS — R0609 Other forms of dyspnea: Secondary | ICD-10-CM

## 2022-08-22 DIAGNOSIS — J84112 Idiopathic pulmonary fibrosis: Secondary | ICD-10-CM | POA: Diagnosis not present

## 2022-08-22 LAB — PULMONARY FUNCTION TEST
DL/VA % pred: 71 %
DL/VA: 2.86 ml/min/mmHg/L
DLCO cor % pred: 73 %
DLCO cor: 16.25 ml/min/mmHg
DLCO unc % pred: 73 %
DLCO unc: 16.25 ml/min/mmHg
FEF 25-75 Pre: 1.66 L/sec
FEF2575-%Pred-Pre: 91 %
FEV1-%Pred-Pre: 109 %
FEV1-Pre: 2.76 L
FEV1FVC-%Pred-Pre: 97 %
FEV6-%Pred-Pre: 116 %
FEV6-Pre: 3.85 L
FEV6FVC-%Pred-Pre: 105 %
FVC-%Pred-Pre: 111 %
FVC-Pre: 3.94 L
Pre FEV1/FVC ratio: 70 %
Pre FEV6/FVC Ratio: 98 %

## 2022-08-22 NOTE — Progress Notes (Signed)
Spirometry and Dlco done today. 

## 2022-08-27 ENCOUNTER — Encounter: Payer: Medicare Other | Admitting: Internal Medicine

## 2022-08-27 ENCOUNTER — Ambulatory Visit (HOSPITAL_BASED_OUTPATIENT_CLINIC_OR_DEPARTMENT_OTHER)
Admission: RE | Admit: 2022-08-27 | Discharge: 2022-08-27 | Disposition: A | Discharge status: Home / Self Care | Payer: Self-pay | Source: Ambulatory Visit | Attending: Internal Medicine | Admitting: Internal Medicine

## 2022-08-27 ENCOUNTER — Ambulatory Visit (INDEPENDENT_AMBULATORY_CARE_PROVIDER_SITE_OTHER): Payer: Medicare Other | Admitting: Internal Medicine

## 2022-08-27 ENCOUNTER — Other Ambulatory Visit: Payer: Self-pay | Admitting: Internal Medicine

## 2022-08-27 ENCOUNTER — Encounter: Payer: Self-pay | Admitting: Internal Medicine

## 2022-08-27 ENCOUNTER — Encounter (INDEPENDENT_AMBULATORY_CARE_PROVIDER_SITE_OTHER): Payer: Medicare Other | Admitting: Internal Medicine

## 2022-08-27 VITALS — BP 126/74 | HR 68 | Ht 66.0 in | Wt 154.0 lb

## 2022-08-27 DIAGNOSIS — I2510 Atherosclerotic heart disease of native coronary artery without angina pectoris: Secondary | ICD-10-CM | POA: Diagnosis not present

## 2022-08-27 DIAGNOSIS — J84112 Idiopathic pulmonary fibrosis: Secondary | ICD-10-CM

## 2022-08-27 DIAGNOSIS — Z006 Encounter for examination for normal comparison and control in clinical research program: Secondary | ICD-10-CM

## 2022-08-27 DIAGNOSIS — Z13820 Encounter for screening for osteoporosis: Secondary | ICD-10-CM | POA: Diagnosis not present

## 2022-08-27 DIAGNOSIS — R634 Abnormal weight loss: Secondary | ICD-10-CM

## 2022-08-27 DIAGNOSIS — M5134 Other intervertebral disc degeneration, thoracic region: Secondary | ICD-10-CM | POA: Diagnosis not present

## 2022-08-27 DIAGNOSIS — Z5181 Encounter for therapeutic drug level monitoring: Secondary | ICD-10-CM

## 2022-08-27 DIAGNOSIS — M5136 Other intervertebral disc degeneration, lumbar region: Secondary | ICD-10-CM | POA: Diagnosis not present

## 2022-08-27 LAB — HEPATIC FUNCTION PANEL
ALT: 26 U/L (ref 0–53)
AST: 26 U/L (ref 0–37)
Albumin: 4.3 g/dL (ref 3.5–5.2)
Alkaline Phosphatase: 45 U/L (ref 39–117)
Bilirubin, Direct: 0.1 mg/dL (ref 0.0–0.3)
Total Bilirubin: 0.2 mg/dL (ref 0.2–1.2)
Total Protein: 6.6 g/dL (ref 6.0–8.3)

## 2022-08-27 LAB — CALCIUM: Calcium: 9.1 mg/dL (ref 8.4–10.5)

## 2022-08-27 LAB — ALKALINE PHOSPHATASE: Alkaline Phosphatase: 45 U/L (ref 39–117)

## 2022-08-27 LAB — VITAMIN D 25 HYDROXY (VIT D DEFICIENCY, FRACTURES): VITD: 24.13 ng/mL — ABNORMAL LOW (ref 30.00–100.00)

## 2022-08-27 LAB — PHOSPHORUS: Phosphorus: 3.3 mg/dL (ref 2.3–4.6)

## 2022-08-27 NOTE — Progress Notes (Signed)
OV 08/09/2020  Subjective:  Patient ID: Reginald Davis, male , DOB: 1946/05/12 , age 76 y.o. , MRN: [id number] , ADDRESS: [place] [address] PCP [name], MD Patient Care Team: [name], MD as PCP - General (Internal Medicine) End, Harrell Gave, MD as PCP - Cardiology (Cardiology) Cardiologist is Dr. Ottie Glazier This Provider for this visit: Treatment Team:  Attending Provider: Brand Males, MD    08/09/2020 -   Chief Complaint  Patient presents with   Consult    COPD, DOE     HPI Reginald Davis 76 y.o. -  retired academic an Therapist, sports for a [employer]  History is given by the patient who also brought in very clearly typed notes about his history.  He says in the 1970s and 1980s while he was busy with his professional life he was a smoker and finally quit smoking in 1997.  His total pack smoking history might be 70 years.  He undergoes regular screening CT scans of the chest.  Most recently March 2021 that reported signs of emphysema and a small upper lobe nodule unchanged over many years and possible interstitial lung disease changes.  He says he was at baseline and doing well but in April 2021 he started noticing shortness of breath.  He was placed on Spiriva which improved his symptoms daily.  After that he felt like significantly improved.  He then was dealing with significant difficult to control blood pressures with Dr. Meda Coffee.  She has increased his dosage of the blood pressure medication.  He was remaining active walking approximately thousand steps daily and 30 minutes of walking 5 more days per week.  Then in September 2020 when he and his wife traveled to [place] and then drove to [place] which is an elevation of 4000-4500 feet.  After that they continue to [facility] [place] feet.  They got very short of breath.  Even had a fever episode.  His pulse oximetry dropped to 84-85.  He ended up in the emergency room  and was given a diagnosis of COPD exacerbation.  Given doxycycline and prednisone and then he improved.  Post improvement in September and post return to [facility] he has had difficult to control blood pressure but he does have some residual shortness of breath with exertion relieved by rest.  His apple watch has noticed desaturations at night that are transient with pulse ox ranging 85 to 96%.  His resting heart rate at bed is between 54 and 90 at night.  His current walking desaturation test is below.  He dropped only 2 points but he did get tachycardic.  His pulmonary function test show slight reduction in DLCO but otherwise okay  He follows with Dr. Meda Coffee who noticed he has left lower lobe crackles.  Due to persistent symptoms he has been referred to pulmonary clinic.  He wants to get to the bottom of the issues.  His echocardiogram shows grade 1 diastolic dysfunction.  He does suffer from some visceral obesity.   Of note he has upcoming air travel to [place] on August 15, 2020.  He is going to see his granddaughter who is not seen since the onset of the pandemic.  He believes he may not be traveling to high altitudes in [place].    ADDENDUM: There is a right lower lobe nodule that is seen between vessels in the right lower lobe on image 97 of series 8.  This nodule was compared to studies dating back to 2015 and shows no change. It is possible that this represents a lymph node along bronchovascular structures but again this is stable over a 6 year interval, obscured by surrounding vessels and bronchovascular structures.     Electronically Signed   By: Zetta Bills M.D.   On: 11/27/2019 14:45    Signed by Felipa Emory, MD on 11/27/2019  2:48 PM  Narrative & Impression  CLINICAL DATA:  Left upper lobe nodules, history of bronchiectasis and emphysema.   EXAM: CT CHEST WITHOUT CONTRAST   TECHNIQUE: Multidetector CT imaging of the chest was performed following  the standard protocol without IV contrast.   COMPARISON:  Coronary CT from 08/26/2018, CT chest from 09/20/2017   FINDINGS: Cardiovascular: Calcified atherosclerotic changes in the thoracic aorta without signs of aneurysm. Limited assessment on noncontrast evaluation. The heart size is normal without pericardial effusion. Coronary artery disease with three-vessel calcification as before.   Mediastinum/Nodes: Thoracic inlet structures are normal. No axillary lymphadenopathy. Small right juxta hilar lymph node, 9 mm unchanged since January of 2019 along with other scattered lymph nodes below CT criteria for pathologic enlargement. Limited assessment of hilar structures due to lack of contrast. Esophagus is mildly patulous with some gas in the lumen.   Lungs/Pleura: Small nodule between bronchovascular structures in the central portion of the right lower lobe (image 97, series 8) 11 x 7 mm, within 1 mm of its size in 2019. No dense consolidation. Mild basilar bronchiectasis. Subpleural reticulation as before with signs of centrilobular emphysema.   Tiny nodules in the left upper lobe are stable.   Upper Abdomen: Incidentally imaged portions of upper abdominal contents are unremarkable. No evidence of acute upper abdominal process.   Musculoskeletal: No chest wall mass. Sclerosis of the right clavicular head is unchanged since 2019. Spinal degenerative changes as before.   IMPRESSION: 1. Small nodule between bronchovascular structures in the central portion of the right lower lobe measuring 11 x 7 mm, within 1 mm of its size in 2019. This nodule is within 1 mm of its size in 2019. This nodule is unchanged since 2015 and this therefore benign. 2. Signs of emphysema and chronic interstitial changes with tiny upper lobe nodules are unchanged. 3. Coronary artery disease with three-vessel calcification.   Aortic Atherosclerosis (ICD10-I70.0) and Emphysema (ICD10-J43.9).    Electronically Signed: By: Zetta Bills M.D. On: 11/26/2019 09:11     08/29/2020- Interim hx Patient presents today to review testing and discuss starting anti-fiibrotic medication. CT imaging suggestive of probable UIP with progression since 2015, high suspicion towards IPF. He had negative serology. Dr. Chase Caller recommend starting anti-fibrotic, either Esbriet or OFEV. Accompanied by his wife during today's appointment. He is baseline today, shortness of breath and cough are the same as last visit. He was recently in [place] for 10 days, he did not need to take on hand doxycyline or prednisone. He had an overnight oximetry test on12/2/21 which showed <88% 5 hrs 35 mins, start 2L [place]. He has not received oxygen yet, he was contacted by DME company. He is interested in [employer] continuous oxygen concentrator and is willing to pay out of pocket in needed. He reports daytime oxygen saturation runs between 87-92% RA. He is active, gets an average of 8,000 steps a day. He will be changing medicare part D plans to [employer] silver come January 1st 2022.     OV 09/29/2020  Subjective:  Patient ID: Reginald Grinder  Danton Davis, male , DOB: [birth year] , age 70 y.o. , MRN: [id number] , ADDRESS: [place] [address] PCP [name], MD Patient Care Team: [name], MD as PCP - General (Internal Medicine) End, Harrell Gave, MD as PCP - Cardiology (Cardiology)  This Provider for this visit: Treatment Team:  Attending Provider: Brand Males, MD    09/29/2020 -   Chief Complaint  Patient presents with   Follow-up    6 min walk performed 09/22/20.  Pt states he feels like he has been stable since last visit. Pt is wearing 2L at night.  Pt states if he is on an incline, his sats are dropping some below 88% on room air.    IPF dx givien 08/26/20 based on "probable UIP on CT + negative srology, +  age > 57, _ white + male + prior smoker + heart burn/GERD hx-> this is "IPF"  Associated mild  emphysema present -started Spiriva  GERD hx  Pirfenidone start this pending January 2022/February 2022  HPI Reginald Davis 76 y.o. -returns for follow-up.  He presents with his wife.  Since his last visit in the interim he saw nurse practitioner.  We gave him a diagnosis of IPF based on probable UIP on CT scan age greater than 60, Caucasian ethnicity, male gender, prior smoker, acid reflux history and negative serology.  He has accepted the diagnosis.  He presents with his wife at this point in time.  They have gone through pirfenidone education and started paperwork.  He is waiting for co-pay approval.  At this point in time he feels stable his symptom score is below.  He is completed a 6-minute walk test but he did not desaturate below 88%.  The focus of this visit is multiple questions in general education and answering questions about idiopathic pulmonary fibrosis  -Overall his cough is improved.  He attributes his cough improvement to omeprazole.  However he wants to switch to pantoprazole.  He did some reading and he said that he is concerned about theoretical effects of omeprazole and pirfenidone plasma levels therefore he wants to switch to pantoprazole.  I have supported this and will do the prescription.  -He is very interested in clinical trials as a care option.  We discussed concept of clinical trials being voluntary and what care option meant.  He is probably leaning towards later phase trials.  We gave him consent form for PROMEDIOR Phase 3 and Pliant phase 2 studies but did indicate to him that there is currently a backlog and in addition he will have to be stable on antifibrotic's for a few months before he can enroll.  He is understanding of this.  -He is interested in the patient support group and I have given him the email for a local support group to the pulmonary fibrosis foundation  -He exercises regularly.  However he is interested in joining pulmonary rehabilitation a  referral has been made but he is still waiting to hear from them.  I have emailed the director to get a follow-up.  -We discussed the natural history of pulmonary fibrosis.  Especially IPF.  We discussed its progressive disease.  He and his wife wanted to know about prognostic markers.  Currently these are not commercially available.  In particular is interested in [place].  Indicated that in the next few years, she will test might be available to differentiate prognosis but at this time we would follow clinically.  Currently based on current severity and  stability predicts future progression.  Also explained viral illnesses can exacerbate diseases  - Lung transplantation: He is interested in this concept.  He wants early referral to [facility] so he gets his options covered.  He knows he has to lose weight.  We discussed common barriers for lung transplantation including psychological Support, social support, financial costs.  He says he does not have any of this.  His BMI is 29 he needs to get to less than 27.  Explained to him nutrition is the way to go on this.  He is also going to do rehabilitation   - We also discussed his emphysema component being small.  He will continue with Spiriva  = Oxygenation -he is using nocturnal oxygen.  His apple watch shows his oxygen levels to be normal now at night.  He is monitoring this.  He says that when he exerts heavily his oxygen does drop but on the 6-minute walk test he did not drop.  He will pay for a portable oxygen system on his own out-of-pocket.  Therefore we will do an order for this.      CT Chest data  No results found.    OV 11/10/2020  Subjective:  Patient ID: Reginald Davis, male , DOB: 1946-02-22 , age 27 y.o. , MRN: [id number] , ADDRESS: [place] [address] PCP [name], MD Patient Care Team: [name], MD as PCP - General (Internal Medicine) End, Harrell Gave, MD as PCP - Cardiology (Cardiology)  This  Provider for this visit: Treatment Team:  Attending Provider: Brand Males, MD    11/10/2020 -   Chief Complaint  Patient presents with   Follow-up    Doing well    IPF dx givien 08/26/20 based on "probable UIP on CT + negative srology, +  age > 99, _ white + male + prior smoker + heart burn/GERD hx-> this is "IPF"  -Last CT scan of the chest December 2021  - Esbriet start date -> Oct 26, 2020   - Pulm rehab Mar 2022   Associated mild emphysema present -started Spiriva    HPI Reginald Davis 76 y.o. -presents with his wife. This is for IPF follow-up. He finally got hold of his pirfenidone. He started this 10/26/2020. So far he is tolerating it well. Earlier this week on 11/08/2020 he started pulmonary rehabilitation. He feels stable. Wife feels he is an under perceiver. On a 6-minute walk test he dropped to as low as 89%. He had a pulmonary function test at [facility] that showed decline in DLCO. He is worried about this. We walked him again today and his pulse ox did drop to 88%. That could have been a variation in place but clearly this a difference suggesting progression. However he is tolerating his pirfenidone fine. No side effects as evidenced below in the symptom score. He really wants to start ASAP on clinical trials. He is already met with [facility] for transplant as an option. He had to consent forms one for phase 3 IV infusion PRomedior and another for Phase 2 Pliant Part C. The latter has moved to Part D which is higher dose. He had more questions about this. We went over several components of clinical trials documented below. Explained to him about therapeutic misconception and secondary intent being therapeutic gain. Advised him that the primary intent should always be about drug development and contribution to science. He understood this. Explained to him currently there is supply  chain issues with a phase 3 study. However the phase 2 sutdy is active. However he were to  wait a few months before he is eligible for either study because he just started on pirfenidone. He understands this. We have given him an updated consent form. [facility]FT [facility] feb [facility]22 FVC 3.55L DLCO 11.02    Telephone call 11/30/20   Nov 2021 -> March 2022 with [place]  - FVC up - dlco down  Compared to [facility] feb 2022  -March 2022 with  [place]  - FVC down  - DLCO up   Overall  - would stay stable  - there is mixed signal - nothing to do for now  - allow anti fibrotic kick in  - be patient   OV 12/21/2020  Subjective:  Patient ID: Reginald Davis, male , DOB: 12/28/1945 , age 62 y.o. , MRN: [id number] , ADDRESS: [place] [address] PCP [name], MD Patient Care Team: [name], MD as PCP - General (Internal Medicine) End, Harrell Gave, MD as PCP - Cardiology (Cardiology)  This Provider for this visit: Treatment Team:  Attending Provider: Brand Males, MD    12/21/2020 -   Chief Complaint  Patient presents with   Follow-up    PFT performed 11/25/20.  Pt states he has been doing okay and denies any complaints.     IPF dx givien 08/26/20 based on "probable UIP on CT + negative srology, +  age > 11, + white + male + prior smoker + heart burn/GERD hx-> this is "IPF"  -Last CT scan of the chest December 2021  - Esbriet start date -> Oct 26, 2020   - Pulm rehab Mar 2022  - Clinical trial education - Mar 2022 [facility]ers D ICF given)  - PFF concept introduction - May 2022   Associated mild emphysema present -started Spiriva  Normal Echo May 2021  HPI Reginald Davis 76 y.o. -returns for follow-up with his wife.  He is now on pirfenidone since mid 01/27/2021 and is tolerating it well.  He reports intentional weight loss.  He has some mild GI side effects that are reflected in the symptom score and this is stable.  He says with a combination of intentional weight loss and rehabilitation and oxygen use [with exertion] his resting heart rate is now in the 60s.  He  showed me his apple watch curve about that.  He is pretty excited about that.  He has had [place] for short of Covid vaccine mRNA.  He is really interested in clinical trials.  He has met with the research coordinator and is scheduled himself for Pliant study visit mid May 2022.  He has read the consent.  He is keeping up with the literature on pulmonary fibrosis.  He is pretty excited about an upcoming IND application approval for the [place] for [employer] BUI [id number].  We will be a study site for this but probably in the fall or the winter 2022.  I told him I do not know much details about this drug as yet.  He says he will start the Pliant study currently.  He wants to know about travel.  He has some prednisone and hand and also antibiotics.  We will go to [place] and [place].  He will take his portable oxygen system with him.  I have approved this travel.  At room air at rest he is fine.  When he exerts himself with treadmill and  exercise he uses 2-3 L pulse.  Most recent liver function test was normal.  This was in December 12, 2020.  He is worried about his progression.  Particular [facility]'s test showed a drop.  He then had pulmonary function test with [place].  I put these results together.  It appears that there is lot of fluctuation particularly with the [facility] test.  But on average it appears his DLCO is declined since he has been with [place] but his FVC is stable.   He is enjoying his rehabilitation.  PFT   Subjective:  Patient ID: Reginald Davis, male , DOB: 03/24/1946 , age 31 y.o. , MRN: [id number] , ADDRESS: [place] [address] PCP [name], MD Patient Care Team: [name], MD as PCP - General (Internal Medicine) Constance Haw, MD as PCP - Electrophysiology (Cardiology) Skeet Latch, MD as PCP - Cardiology (Cardiology)  This Provider for this visit: Treatment Team:  Attending Provider: Brand Males,  MD    08/22/2021 -   Chief Complaint  Patient presents with   Follow-up    Pt states he has had diarrhea for the past 6 weeks and is unsure if it is due to his medication.     IPF dx givien 08/26/20 based on "probable UIP on CT + negative srology, +  age > 59, + white + male + prior smoker + heart burn/GERD hx-> this is "IPF"  -Last CT scan of the chest December 2021  - Esbriet start date -> Oct 26, 2020   - Pulm rehab Mar 2022  - Clinical trial education - Mar 2022 [facility] D ICF given)     - Pliant study participant Leona Singleton [phone] and was randomized 22Jun2022  - PFF concept introduction - May 2022   Associated mild emphysema present -started Spiriva  Normal Echo May 2021  HPI Reginald Davis 76 y.o. -   IPF: [facility] visit for OPF. On esbrit and also study (IMP v placebo). Has study  visit#10 in 19Dec, then come back for EOT on 30Jan and EOS on 14Feb and then he is done.  From a respiratory standpoint his symptom scores are slowly actually improved.  His FVC also shows slow improvement over time.  In August 2022 he had a walk test at [facility] and this shows improvement in walking distance.  His weight is stable.  He had recent high-resolution CT chest.  Compared to September 2022 his ILD is stable but compared to remote past there is progression.  Overall he is pleased with the symptoms and stability.  He uses oxygen 2 L at night and 3 L pulsed with exercise and while traveling.  He continues on pirfenidone and study drug.  He should finish the study next month or so.  Rash: Sometime around September 2022 or so he developed punctate rash in his forearms and his back.  He says this still persist but it is stable its not any worse.  He is not overly concerned about this  Main new issue is that since the last 6 weeks he has had diarrhea.  Started as mild but since then is gotten progressive.  He says currently it is severe.  He is having a lot of flatulence and when he has  significant amount of flatulence he is having some amount of stool in his underwear.  For the last 3 weeks he is taking align probiotic.  For the last 1 week he is also  taking a protein shake but he says there is no sweetener in it.  For the last 1 week he is started Imodium.  After this diarrhea is better.  He still having diarrhea 3-4 times a day.  He is taking Imodium 3-4 times a day.  The stool is definitely more formed but it still worrisome for him.  He says he is cut down on nuts and fruits.  He denies any artificial sweetener.  Did medication review for diarrhea causing drugs: The only new introduction is a study drug and prior to that was the pirfenidone.  However he is on other drugs chronically that can cause diarrhea and this includes colchicine greater than 20%, Diovan greater than 5% and PPI.  Initially did not complain about pain but when I asked him he said sometimes he has some right lower quadrant discomfort.  There is no fever or bloody stools.  He had recent safety labs on 08/15/2021.  I personally reviewed this and this is normal.     Upcoming travel: He is plan to go to [place] in January 2023.  This is for 1-2 weeks and is to visit his daughter and his only grandchild.  He wants his diarrhea significantly addressed before the trip.  CT Chest data  No results found.    OV 11/28/2021  Subjective:  Patient ID: Reginald Davis, male , DOB: [birth year] , age 55 y.o. , MRN: [id number] , ADDRESS: [place] [address] PCP [name], MD Patient Care Team: [name], MD as PCP - General (Internal Medicine) Constance Haw, MD as PCP - Electrophysiology (Cardiology) Skeet Latch, MD as PCP - Cardiology (Cardiology)  This Provider for this visit: Treatment Team:  Attending Provider: Brand Males, MD    11/28/2021 -   Chief Complaint  Patient presents with   Follow-up    Pt states he has been doing okay since last visit.   HPI Reginald Davis 76 y.o. -returns for follow-up.  Presents with his wife.  He tells me that he is doing well overall.  After coming off the pliant study in February 2023 for 2 weeks he thought he had increased nocturnal desaturations but then this settled out and he is better.  He is intentionally lost weight.  He did go for a lung transplant evaluation at [facility] 11/01/2021.  His 6-minute walking test is improved.  His pulmonary function test also has improved [see below].  He attributes this improvement to being on the research study protocol.  We do not know if he was randomized to placebo or the actual medicine of product.  He believes he was randomized to the actual medicine product.  He did have some increased diarrhea during the study and he says this is now improved.  He is interested in more studies.  Although the current improvement in diarrhea is also because he is taking Imodium on a scheduled basis.  He continues to use 2 L at night and 3 L with exercise although 6-minute walk test had an improved distance and did not desaturate below 91% and he finished 15 laps at [facility].  He is interested in more clinical trials.  We we discussed a phase 2 oral anticollagen inhibitor study by [employer] sponsor.  He has taken the consent to review.  He is also interested in the future injection study that he came across in the Internet.  It is sponsored by [employer].  Were not a location for the  study at this point in time but did indicate to him that we are in communication and do not have much information to share with him about that.  He is going to read the Internet and let us know what study he wants to do.  He continues to have dry skin.  We talked about flaxseed  He has tended travel to [place] in July 2023.  He wants [place] Paxlovid refilled.  He also wants doxycycline and prednisone refill for the trip.  I agreed to do that.    Subjective:  Patient ID: Reginald Davis, male , DOB: [birth year] , age 7 y.o. ,  MRN: [id number] , ADDRESS: [place] [address] PCP [name], MD Patient Care Team: [name], MD as PCP - General (Internal Medicine) Constance Haw, MD as PCP - Electrophysiology (Cardiology) Skeet Latch, MD as PCP - Cardiology (Cardiology)  This Provider for this visit: Treatment Team:  Attending Provider: Brand Males, MD    03/02/2022 -   Chief Complaint  Patient presents with   Follow-up    Pt here for f/u on ILD, states breathing has been good some SOB increased coughing.      HPI Reginald Davis 76 y.o. -returns for follow-up.  He has completed his pliant research protocol in February 2023.  He continues his as..  He uses 2 L nasal cannula at night.  He uses oxygen with exertion.  Symptom score shows stable.  Pulmonary function test is stable.  However he feels that at night his Apple Watch on 2 L nasal cannula showing more desaturations than usual.  We did discuss about getting this formally tested with a overnight pulse oximetry to see if he would need more oxygen.  He is agreeable to this plan.  I did indicate to him that given stability in symptoms and pulmonary function test and the walking desaturation test  -pretest probably due for him desaturating at night more than usual would be low.  Is also willing to get echocardiogram and a BNP check.  Depending on this we might have to consider right heart catheterization if for WHO group 3 pulmonary hypertension is in the differential diagnosis.  But overall I think the probably of pulmonary hypertension is also low given the stability.   He continues to be interested in research protocols.  He is particularly interested in a study called  MOONSCAPE -which involves the STAT3 pathway against IPF and it would involve a subcutaneous injection.  He is reading a consent form.  He is willing to participate if he passes on prescreen.    He also believes that his acid reflux is slightly more active.   He is taking his PPI.  He is doing all the other measures.  But did notice that he is taking fish oil.  He takes this for his hyperlipidemia.  I did indicate to him side effect of fish oil as acid reflux.  He is now on Praluent for his hyperlipidemia which is working really well.  Therefore he is willing to stop the fish oil.   In terms of dry skin: He takes some flaxseed he is putting a new lotion and this is better.  In terms of travel he is going to go to [place] again to see his grandchild.  This will be in July 2023.  In terms of his complete heart block: He is on pacer but he believes the pacer is more active now.  OV  06/07/2022  Subjective:  Patient ID: Reginald Davis, male , DOB: 27-Mar-1946 , age 22 y.o. , MRN: [id number] , ADDRESS: [place] [address] PCP [name], MD Patient Care Team: [name], MD as PCP - General (Internal Medicine) Constance Haw, MD as PCP - Electrophysiology (Cardiology) Skeet Latch, MD as PCP - Cardiology (Cardiology)  This Provider for this visit: Treatment Team:  Attending Provider: Brand Males, MD    06/07/2022 -   Chief Complaint  Patient presents with   Follow-up    PFT was cancelled by pt.  Pt states he has had a few episodes where he has noticed lower O2 sats than before which was seen on walk test done by [facility]. Pt also states he has had a little more coughing than usual.     HPI Reginald Davis 76 y.o. -returns for follow-up this is standard of care visit.  I saw him 2 days ago as a research visit.  He enrolled into a new study.  The study involved a subcutaneous injection he signed consent.  He had screening pulmonary function test documented below.  For the first time his FEV1 FVC ratio is below 70.  He did stop Spiriva before the breathing test based on protocol requirements.  In review of his pulmonary function test he had end of August another pulmonary function test at [facility] and even that the  ratio has gone down.  Prior to that his FEV1 FVC ratio is always over 70.  The first time this is gone down.  I did indicate to him I do not understand why but I have a few patients this is happened.  He feels stable.  His 6-minute walk test at [facility] was stable.  His symptom scores are stable.  Uses oxygen with exercise.  His wife wanted me to caution him not to over exercise and desaturate too much.  I did tell him to keep his pulse ox over 88%.  He is very interested in clinical trials but he understands that if his FEV1 FVC ratio is less than 70 he will disqualify for many trials.  He says he has come to terms with this.  He is on Esbriet and is tolerating this well without any problems.  He did have [facility] lung transplant visit I reviewed those notes.  They feel he is too early for transplant.    OV 08/27/2022  Subjective:  Patient ID: Reginald Davis, male , DOB: [birth year] , age 89 y.o. , MRN: [id number] , ADDRESS: [place] [address] PCP [name], MD Patient Care Team: [name], MD as PCP - General (Internal Medicine) Constance Haw, MD as PCP - Electrophysiology (Cardiology) Skeet Latch, MD as PCP - Cardiology (Cardiology)  This Provider for this visit: Treatment Team:  Attending Provider: Brand Males, MD    08/27/2022 -   Chief Complaint  Patient presents with   Follow-up    Follow up for IPF. Pt states he has felt stable since last visit. Pt states he is back on his full dose of pirfenidone now and is tolerating it well. Pt is also on Spirivia daily and no issues noted with that and albuterol as needed. Pt states he has not felt any issues with his breathing since last visit Pt also had PFT on 12/13    IPF dx givien 08/26/20 based on "probable UIP on CT + negative srology, +  age > 68, + white + male +  prior smoker + heart burn/GERD hx-> this is "IPF"  -Last CT scan of the chest December 2022  - Esbriet start date -> Oct 26, 2020    - Pulm rehab Mar 2022  - Clinical trial education - Mar 2022 (Pliant D ICF given)     - Pliant study participant - onsent [phone] and was randomized 22Jun2022 -0> completed early (feb) 2023   - Moonscaope SQ Vixarelimab study - Screen failed due to low ratio in fev1/fvc  - PFF concept introduction - May 2022   -Going for [facility] conference November 2023      Associated mild emphysema present -started Spiriva  Normal Echo May 2021 and summer 2023  WEight loss  April 2022: 171#  June 2023: 162#  SEpt 2023: 158# - bmi 25.14 Aug 2022: 154#- bmi 24.4  HPI Reginald Davis 76 y.o. -returns for standard of care visit.  In this visit he tells me that the back spasm he had he went to physical therapy and they found out that he had a little bit of right shoulder droop from wearing his portable oxygen this then created contralateral stretch.  Then after intense physical therapy is better.  He feels this is also helped his lung function.  At last visit he was also having diarrhea.  We stopped the pirfenidone but the diarrhea persisted.  We did extensive stool studies and it was normal.  I referred him to Dr. Carol Ada, GI and right before he saw Dr. Benson Norway he was actually taking Xifaxan which helped resolve the diarrhea.  He was on pirfenidone holiday from early October 2023 through early November 2023 and he has slowly escalated his pirfenidone back up to the full dose.  Initially he had some diarrhea upon starting pirfenidone but now he is tolerating it well.  He is on full dose.  Through all this since September he is lost another 4 pounds of weight.  He feels that when he was off his pirfenidone his weight loss has stabilized.  Currently the BMI is 24.4 and he is at ideal body weight.  He is also exercising aggressively and he attributes some of the weight loss because of this.  He keeps his oxygen levels greater than 88%.  He has a new year pulse oximeter that he allows him to continuously track  his pulse ox.  He is up-to-date with his respiratory vaccines.  He is driving to [place] tomorrow to be with his daughter who is visiting him from [place] for Christmas.  He is still interested in research protocols.  He disqualified from a previous research study because of low T1/FVC ratio screening.  Currently standard of care rate she was fine and DLCO is also stable.  He wants to see if he will rescreen.  He is also interested in the study run by [place] at another site.  He is going to send me some information to read about that particular product.    SYMPTOM SCALE - ILD 09/29/2020  11/10/2020 Now on esbriet sinc 10/26/20 12/21/2020 esbriet + 171# 08/22/2021 Esbriet and Pliant study drug , 173# 11/28/2021 Esbriet, off plaint study, 2L [place] night, 3L [place] exercise, 163# intential weight loss 03/02/2022 162# night o2 , ex o2 subjectively 06/07/2022 Uses exercise oxygen 08/27/2022 154#  - diarrhea sept/oc and took esbriet holiday early oc-nov 2023 and needex xifaxan. BAck on esbrie at time of this visit  O2 use ra  ra a t rest 2-3L with exercise  pulsed 2L [place] at night, 3L pulsed during exerise and travels      Shortness of Breath 0 -> 5 scale with 5 being worst (score 6 If unable to do)         At rest 0.5 0 0._0 0.5 1  Simple tasks - showers, clothes change, eating, shaving 1 00 0 0 _1 Household (dishes, doing bed, laundry) 1 0 0._2 1._3 Shopping 1 0 0._4 1.5 1 1.5  Walking level at own pace 1._5 0 _6 Walking up Stairs *2._7 2.5  Total (30-36) Dyspnea Score 7._8 7.5 10  How bad is your cough? Improved with ppi 1 1 ` 1 1 0.5 1  How bad is your fatigue 0 0 0.5 ` _9 How bad is nausea 0 0 0 0 0 0 0 0  How bad is vomiting?  0 00 0 0 0 0 0 0  How bad is diarrhea? 0.5 coffee 0 0._10 0 2.5 1  How bad is anxiety? 0.5 0 0.5 1 0 0 0.5 0  How bad is depression 0 0 0 0 0 0 0 0   Walk test Simple office walk 185 feet x  3 laps  goal with forehead probe 08/09/2020  11/10/2020 - [facility] 89% and HR111. 1296 fet. 0L o2 needed. Pl JR 111/ done 10/24/20  08/22/2021 - 644m at [facility] 05/01/21 - walked 1671 feet wihout o2. This is an imprement  03/02/2022  163# -11/01/2021 [facility] the 6-minute walk and completed 174 6 feet.  Lowest pulse ox was 91%.  Heart rate was 107.  Did 15 labs.  Did not need oxygen. Aug 2023 at [facility] - 1734 feet 4m4m Did not need o2 08/27/2022   O2 used ra ra ra ra  ra  Number laps completed _11 Comments about pace normal nL      Resting Pulse Ox/HR 96% and 80/min 96% and 90.min 99% and 60 97% and HR 62  100% and HR 68  Final Pulse Ox/HR 94% and 90/min 88% and 97/min 95% and 94 94% and HR 84  97% and HR 106  Desaturated </= 88% no yes yno no  no  Desaturated <= 3% points no Yes, 8 points Yes, 4 Yes, 3 points  Yes, 3 points  Got Tachycardic >/= 90/min yes yes yes no  yes  Symptoms at end of test dyspnea Some dyspnea Mild dyspea none  none  Miscellaneous comments x   Bris pad  stable      Results for LOCOMAIR, DETTMERRN [id number] of 12/21/2020 11:06   Ref. Range 07/12/2020 11:53 10/24/2020 [facility] 11/25/2020 08:52  FVC-Pre Latest Units: L 3.36 3.55 3.43  FVC-%Pred-Pre Latest Units: % 88 100% 94    Results for LOCANSHUL, MEDDINGSRN [id number] of 12/21/2020 11:06   Ref. Range 07/12/2020 11:53 10/24/2020 [facility] 11/25/2020 08:52  DLCO cor Latest Units: ml/min/mmHg 16.88 11.02 13.45  DLCO cor % pred Latest Units: % 73 49.3 60    PFT PFT     Latest Ref Rng & Units 08/22/2022    8:53 AM 03/02/2022    8:49 AM 11/25/2020    8:52 AM 07/12/2020   11:53 AM  PFT Results  FVC-Pre L 3.94  P 3.89  3.43  3.36   FVC-Predicted Pre % 111  P 108  94  88   FVC-Post L    3.41   FVC-Predicted Post %    90   Pre FEV1/FVC % % 70  P 71  74  72   Post FEV1/FCV % %    74   FEV1-Pre L 2.76  P 2.75  2.53  2.41   FEV1-Predicted Pre % 109  P 106  97  88   FEV1-Post L    2.51   DLCO uncorrected  ml/min/mmHg 16.25  P 14.76  13.56  16.88   DLCO UNC% % 73  P 66  60  73   DLCO corrected ml/min/mmHg 16.25  P 14.84  13.45  16.88   DLCO COR %Predicted % 73  P 67  60  73   DLVA Predicted % 71  P 66  66  80   TLC L    5.39   TLC % Predicted %    83   RV % Predicted %    65     P Preliminary result       has a past medical history of Burn, Coronary artery disease, Diverticul disease small and large intestine, no perforati or abscess, GERD (gastroesophageal reflux disease), Hernia, History of nuclear stress test, Hyperlipidemia, Low back pain, Oxygen toxicity, Plantar fasciitis, Retinal tear, Right rotator cuff tear, and Shingles.   reports that he quit smoking about 26 years ago. His smoking use included cigarettes. He has a 40.00 pack-year smoking history. He has never used smokeless tobacco.  Past Surgical History:  Procedure Laterality Date   EYE SURGERY     PACEMAKER IMPLANT N/A 01/04/2021   Procedure: PACEMAKER IMPLANT;  Surgeon: Constance Haw, MD;  Location: [facility];  Service: Cardiovascular;  Laterality: N/A;   REFRACTIVE SURGERY     retina repair and retina tear   SHOULDER ARTHROSCOPY     SHOULDER SURGERY     TONSILLECTOMY      No Known Allergies  Immunization History  Administered Date(s) Administered   Fluad Quad(high Dose 65+) 06/07/2022   Influenza Split 09/22/2009, 06/01/2010, 06/07/2011, 05/27/2012, 07/28/2013   Influenza, High Dose Seasonal PF 06/15/2015, 06/05/2016, 06/08/2017, 06/07/2018   Influenza, Quadrivalent, Recombinant, Inj, Pf 06/07/2018, 06/12/2019, 06/22/2020, 06/01/2021   Influenza,inj,Quad PF,6+ Mos 06/18/2014   Moderna Covid-19 Vaccine Bivalent Booster 3yr & up 06/14/2021, 01/05/2022   PFIZER(Purple Top)SARS-COV-2 Vaccination 10/05/2019, 10/27/2019, 06/03/2020   Pneumococcal Conjugate-13 08/12/2013, 06/13/2014   Pneumococcal Polysaccharide-23 11/04/2000, 09/22/2009, 08/16/2010   Respiratory Syncytial Virus Vaccine,Recomb  Aduvanted(Arexvy) 05/21/2022   Td 09/10/1998, 09/22/2009, 12/23/2019   Tdap 10/02/2010   Typhoid Inactivated 09/09/2015   Zoster Recombinat (Shingrix) 01/13/2017, 04/26/2017   Zoster, Live 09/16/2007, 09/22/2009    Family History  Problem Relation Age of Onset   Hyperlipidemia Mother    Diabetes Father    Heart disease Father    Coronary artery disease Brother      Current Outpatient Medications:    albuterol (VENTOLIN HFA) 108 (90 Base) MCG/ACT inhaler, Inhale 1-2 puffs into the lungs every 6 (six) hours as needed for wheezing or shortness of breath., Disp: 18 g, Rfl: 1   Alirocumab (PRALUENT) 150 MG/ML SOAJ, Inject 150 mg as directed every 30 (thirty) days. , Disp: , Rfl:    allopurinol (ZYLOPRIM) 100 MG tablet, Take 1 tablet by mouth 2 (two) times daily., Disp: , Rfl:    Alpha-Lipoic Acid 200 MG CAPS, Take  1 capsule by mouth in the morning and at bedtime., Disp: , Rfl:    aspirin 81 MG tablet, Take 81 mg by mouth daily., Disp: , Rfl:    atorvastatin (LIPITOR) 20 MG tablet, Take 10 mg by mouth at bedtime., Disp: , Rfl:    Coenzyme Q10 300 MG CAPS, Take 1 capsule by mouth every morning., Disp: , Rfl: 0   colchicine 0.6 MG tablet, Take 1 tablet by mouth 2 (two) times daily as needed (gout flare)., Disp: , Rfl:    dorzolamide-timolol (COSOPT) 22.3-6.8 MG/ML ophthalmic solution, Place 1 drop into the right eye 2 (two) times daily., Disp: , Rfl:    ESBRIET 267 MG TABS, Take 3 tablets (801 mg total) by mouth 3 (three) times daily with meals., Disp: 810 tablet, Rfl: 1   fluorometholone (FML) 0.1 % ophthalmic suspension, Place 1 drop into the right eye daily., Disp: , Rfl:    Guaifenesin 1200 MG TB12, Take 1,200 mg by mouth 2 (two) times daily., Disp: , Rfl:    Loperamide-Simethicone 2-125 MG TABS, Take 1 tablet by mouth as needed. Up to 4 times a day., Disp: , Rfl:    Multiple Vitamins-Minerals (CENTRUM SILVER PO), Take 1 tablet by mouth every evening., Disp: , Rfl:    OXYGEN, Inhale 2 L  into the lungs., Disp: , Rfl:    Polyethyl Glycol-Propyl Glycol (SYSTANE) 0.4-0.3 % SOLN, Apply 1 drop to eye daily as needed (dry eyes)., Disp: , Rfl:    SPIRIVA HANDIHALER 18 MCG inhalation capsule, INHALE 1 CAPSULE VIA HANDIHALER ONCE DAILY AT THE SAME TIME EVERY DAY, Disp: 30 capsule, Rfl: 6   triamcinolone cream (KENALOG) 0.1 %, Apply topically in the morning and at bedtime., Disp: , Rfl:    valACYclovir (VALTREX) 1000 MG tablet, Take 1 tablet by mouth daily as needed (fever blisters)., Disp: , Rfl:    valsartan (DIOVAN) 320 MG tablet, TAKE 1 TABLET (320 MG TOTAL) BY MOUTH EVERY EVENING., Disp: 90 tablet, Rfl: 3   vitamin C (ASCORBIC ACID) 500 MG tablet, Take 500 mg by mouth every evening., Disp: , Rfl:    XIFAXAN 550 MG TABS tablet, Take 550 mg by mouth 3 (three) times daily., Disp: , Rfl:       Objective:   Vitals:   08/27/22 0934  BP: 126/74  Pulse: 68  SpO2: 99%  Weight: 154 lb (69.9 kg)  Height: _0  (1.676 m)    Estimated body mass index is 24.86 kg/m as calculated from the following:   Height as of this encounter: _1  (1.676 m).   Weight as of this encounter: 154 lb (69.9 kg).  _2 @  Filed Weights   08/27/22 0934  Weight: 154 lb (69.9 kg)     Physical Exam   General: No distress. Look swell Neuro: Alert and Oriented x 3. GCS 15. Speech normal Psych: Pleasant Resp:  Barrel Chest - no.  Wheeze - no, Crackles - YES BASE, No overt respiratory distress CVS: Normal heart sounds. Murmurs - no Ext: Stigmata of Connective Tissue Disease - no HEENT: Normal upper airway. PEERL +. No post nasal drip        Assessment:       ICD-10-CM   1. IPF (idiopathic pulmonary fibrosis) (HCC)  J84.112 Hepatic function panel    Pulmonary function test    2. Encounter for therapeutic drug monitoring  Z51.81 Hepatic function panel    3. Weight loss, non-intentional  R63.4 Hepatic function panel  Plan:     Patient Instructions     ICD-10-CM   1.  IPF (idiopathic pulmonary fibrosis) ([place])  J84.112     2. Encounter for therapeutic drug monitoring  Z51.81     3. Weight loss, non-intentional  R63.4        IPF clinically stable on symptoms and PFT over time Diarrhea - unrelated to esbriet and resolved with  xifaxan Weight loss - ongoing  ? Due to exercise. Might or might not be relate to esbriet  - currently at ideal body weight and acceptable  Plan   - cotniue esbriet as before - talk to PCP [name], MD about weight loss if it continues (currently ideal) - continue daily exercise - keep pulse ox  >-= 88% - check LFT 08/27/2022 - refer to [facility] for enrollment in DEXA Bone scan study - will look at research protocols for you   - re-eval for Moonscape +/- other studies  - will look up BMS study - please send any info you might have - do spirometry and dlco in 4 months - Happy Holidays in [place]. - my regards to your daughter and family  Follow-up - At 47-monthstandard of care visit with Dr. [name]30 minutes-but after spirometry and DLCO  -Symptom questionnaire and walk test at follow-up  - [facility] if in a research studty  High complex medical condition requiring high risk prescription with intensive therapeutic monitoring requirement   SIGNATURE    Dr. MBrand Males M.D., F.C.C.P,  Pulmonary and Critical Care Medicine Staff Physician, CIndexDirector - Interstitial Lung Disease  Program  [place] [id number] Pager: [phone] If no answer or between  15:00h - 7:00h: call [phone] Telephone: [phone]  10:30 AM 08/27/2022

## 2022-08-27 NOTE — Addendum Note (Signed)
Addended by: Rickey Primus on: 08/27/2022 11:25 AM   Modules accepted: Orders

## 2022-08-27 NOTE — Patient Instructions (Addendum)
ICD-10-CM   1. IPF (idiopathic pulmonary fibrosis) ([name])  J84.112     2. Encounter for therapeutic drug monitoring  Z51.81     3. Weight loss, non-intentional  R63.4        IPF clinically stable on symptoms and PFT over time Diarrhea - unrelated to esbriet and resolved with  xifaxan Weight loss - ongoing  ? Due to exercise. Might or might not be relate to esbriet  - currently at ideal body weight and acceptable  Plan   - cotniue esbriet as before - talk to PCP [name], MD about weight loss if it continues (currently ideal) - continue daily exercise - keep pulse ox  >-= 88% - check LFT 08/27/2022 - refer to [facility] for enrollment in DEXA Bone scan study - will look at research protocols for you   - re-eval for Moonscape +/- other studies  - will look up BMS study - please send any info you might have - do spirometry and dlco in 4 months - Happy Holidays in [place]. - my regards to your daughter and family  Follow-up - At 56-monthstandard of care visit with Dr. [name]30 minutes-but after spirometry and DLCO  -Symptom questionnaire and walk test at follow-up  - [facility] if in a research studty

## 2022-08-27 NOTE — Progress Notes (Signed)
REsearch visit  This visit for [name] with DOB: 01/18/1946 on 08/27/2022 for the above protocol is Visit/Encounter # consent  and is for purpose of research . Subject/LAR expressed continued interest and consent in continuing as a study subject. Subject thanked for participation in research and contribution to science.    [place] Study [phone] study DEXA Scan in IPF  S: Subject is interested in participation in the study.  The study involves blood work and spinal films and DEXA bone scan and evaluating osteopenia versus osteoporosis in patients with idiopathic pulmonary fibrosis.  He has signed consent.  There is no exam for this.     SIGNATURE    Dr. Brand Males, M.D., F.C.C.P, ACRP-CPI Pulmonary and Critical Care Medicine Research Investigator, [facility] Staff Physician, [place] Director - Interstitial Lung Disease  Program  Pulmonary [facility] and [facility] @ [place], [place], [address]   Pager: [phone], If no answer  OR between  19:00-7:00h: page [phone] Telephone (research): [phone]  10:43 AM 08/27/2022   10:43 AM 08/27/2022

## 2022-08-28 ENCOUNTER — Other Ambulatory Visit: Payer: Self-pay | Admitting: Internal Medicine

## 2022-08-28 DIAGNOSIS — Z006 Encounter for examination for normal comparison and control in clinical research program: Secondary | ICD-10-CM

## 2022-08-28 DIAGNOSIS — J84112 Idiopathic pulmonary fibrosis: Secondary | ICD-10-CM

## 2022-08-30 ENCOUNTER — Telehealth: Payer: Self-pay | Admitting: Internal Medicine

## 2022-08-30 NOTE — Telephone Encounter (Signed)
  Bone scan results are pending Thoracic spine shows moderate DJD -follow-up with primary care Lumbar spine shows moderate to severe DJD -follow-up with primary care Vitamin D is low  - - vitamin D3 50,000 (50k) units once a week x 12 weeks, then once a month on first of each month  - this cannot be vitamin d2. But has to to be Vit D3  - If pharmacy only has vitamin d2 let me know, then he should get replesta (spl vit d3) at same dose - replesta can be obtained by following instructions at [email or web address]     LABS    Latest Reference Range & Units 08/27/22 11:06  VITD 30.00 - 100.00 ng/mL 24.13 (L)  (L): Data is abnormally low PULMONARY No results for input(s): "PHART", "PCO2ART", "PO2ART", "HCO3", "TCO2", "O2SAT" in the last 168 hours.  Invalid input(s): "PCO2", "PO2"  CBC No results for input(s): "HGB", "HCT", "WBC", "PLT" in the last 168 hours.  COAGULATION No results for input(s): "INR" in the last 168 hours.  CARDIAC  No results for input(s): "TROPONINI" in the last 168 hours. No results for input(s): "PROBNP" in the last 168 hours.   CHEMISTRY Recent Labs  Lab 08/27/22 1106  CALCIUM 9.1  PHOS 3.3   CrCl cannot be calculated (Patient's most recent lab result is older than the maximum 21 days allowed.).   LIVER Recent Labs  Lab 08/27/22 1106  AST 26  ALT 26  ALKPHOS 45  45  BILITOT 0.2  PROT 6.6  ALBUMIN 4.3     INFECTIOUS No results for input(s): "LATICACIDVEN", "PROCALCITON" in the last 168 hours.   ENDOCRINE CBG (last 3)  No results for input(s): "GLUCAP" in the last 72 hours.       IMAGING x48h  - image(s) personally visualized  -   highlighted in bold No results found.

## 2022-09-05 ENCOUNTER — Other Ambulatory Visit (HOSPITAL_COMMUNITY): Payer: Self-pay

## 2022-09-05 ENCOUNTER — Telehealth: Payer: Self-pay

## 2022-09-05 NOTE — Telephone Encounter (Signed)
Pt returned call, provided new insurance info. Attempted to start PA process however request cancelled automatically stating that pt is "future active [id number]" (Key: [id number]). Will reattempt process after the start of the new year.   RxBIN: [id number] RxPCN: [id number] RxGRP: [id number] ID: [id number]

## 2022-09-05 NOTE — Telephone Encounter (Signed)
Received fax from [facility] stating that pt has provided them with new insurance information through [facility], however they did not provide BIN or PCN to allow for BIV. Eligibility check still pulls up pt's previous [employer] insurance.   ATC for more information but had to LVM requesting a return call. Direct office number provided.

## 2022-09-08 ENCOUNTER — Other Ambulatory Visit: Payer: Self-pay | Admitting: Cardiovascular Disease

## 2022-09-11 NOTE — Telephone Encounter (Signed)
Rx request sent to pharmacy.  

## 2022-09-12 NOTE — Research (Signed)
[place]:  An Exploratory Assessment of Bone Disease in Idiopathic Pulmonary Fibrosis    Primary Endpoint  The primary objectives of this exploratory study are to broadly estimate: the prevalence and severity of osteopenia and osteoporosis in those with IPF, and the prevalence of vertebral fragility fracture(s) in those with IPF.  Study Code: [id number] Protocol Edition: Number 2.0, Date 09 May 2022 Sponsor Name: [employer] Primary Investigator: Dr. Brand Males  Consent Version for 08/27/2022 date of 01 Aug 2022    Key Feature of Study: This is an exploratory study to broadly assess bone disease in those with IPF. Participants are expected to attend one visit consisting of informed consent, medical history, lab blood draw, lateral thoracic and lumbar x-rays, and a DXA bone density scan. X-rays and DXA scan may be done on the same day as screening visit or within 7 days after screening. Study participation is concluded once visit, labs, and radiologic studies completed.  Key Inclusion Criteria:   1. Male or male participants aged 22 years or older with mild-to-moderate IPF (defined as FVC >= 45% predicted at most recent measurement). 2.. Negative pregnancy test (high sensitivity urine pregnancy test) for male participants prior to imaging.  Key Exclusion Criteria: 1. Non-IPF interstitial lung disease. 2. (FEV1))/FVC ratio <0.70. 3. Active enrollment or recent participation (ie, within 3 months or less than 5 half-lives of investigational medical product whichever is longer) in an interventional clinical trial where participants received an investigational medical product or placebo. 4. Current systemic corticosteroid treatment. 5. Prior history of hip, thoracic spine, or lumbar spine surgery if residual hardware remains within these areas. 6. Prior history of single or bilateral lung transplantation. 7. Current or prior history of a hematologic malignancy or metastatic  cancer. 8. Nuclear medicine study within the past 1 week or an X-ray procedure using contrast within the past 72 hours. 9. Individuals who have had calcium supplements within 24 hours of the dual X-ray absorptiometry (DXA) scan. Note: Individuals using calcium supplements are not excluded if they temporarily discontinue their calcium 24 hours prior to their DXA scan. Calcium supplements may be resumed following their DXA scan. 76. Women who are pregnant.  Safety Data: Not Applicable.  No interventional product given.   [facility] [place] Coordinator note:   This visit for [name] with DOB: 1945-12-25 on 08/27/2022 for the above protocol is Visit/Encounter # 1  and is for purpose of research.   The consent for this encounter is under Protocol Version 2.0 and is currently IRB approved.   Subject expressed continued interest and consent in being a study subject. Subject thanked for participation in research and contribution to science. In this visit 08/27/2022 the subject will be evaluated by Principal Investigator  named Dr. Chase Caller. This research coordinator has verified that the above investigator is up to date with his/her training logs.    This visit is a key visit of screening. The PI is available for this visit.    Please refer to ICF checklist or subject binder for additional information.  Signed by  Darius Bump Pool  Clinical Research Coordinator [facility]  [place], [place] 11:48 AM 09/12/2022

## 2022-09-17 NOTE — Telephone Encounter (Signed)
Submitted a Prior Authorization request to [facility] for [place] via CoverMyMeds. Will update once we receive a response.  Key: [id number]

## 2022-09-18 DIAGNOSIS — R109 Unspecified abdominal pain: Secondary | ICD-10-CM | POA: Diagnosis not present

## 2022-09-18 DIAGNOSIS — M545 Low back pain, unspecified: Secondary | ICD-10-CM | POA: Diagnosis not present

## 2022-09-26 IMAGING — CT CT CHEST HIGH RESOLUTION W/O CM
4 of 8 series · 13 of 36 positions shown, 15 images · non-contrast
Comparison: Chest CT 08/11/2020.
COMPARISON: Chest CT 08/11/2020.

Addendum:
CLINICAL DATA: 75-year-old male with history of idiopathic
pulmonary fibrosis.

EXAM:
CT CHEST WITHOUT CONTRAST
TECHNIQUE: Multidetector CT imaging of the chest was performed following the
standard protocol without intravenous contrast. High resolution
imaging of the lungs, as well as inspiratory and expiratory imaging,
was performed.

[Series 4: high res 2x2 st · axial · 0.71mm/px · z∈[-290,-184]mm · 2 of 160 slices shown]
[im 54/160  lung]
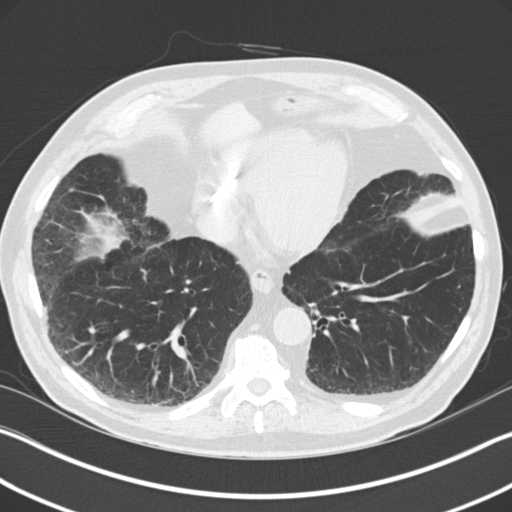
[im 107/160  lung]
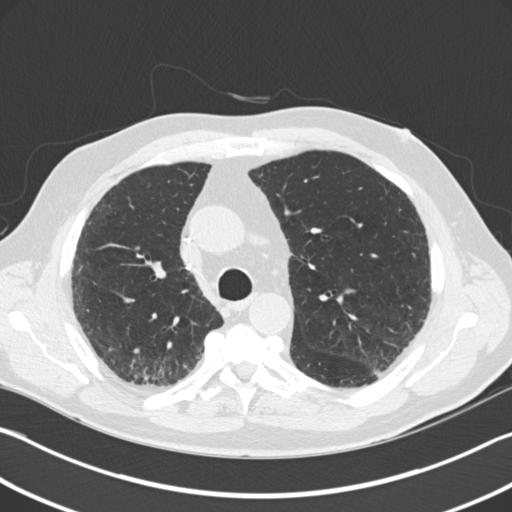

[Series 5: high res 2x2 lung · axial · 0.71mm/px · z∈[-290,-184]mm · 2 of 160 slices shown]
[im 54/160  lung]
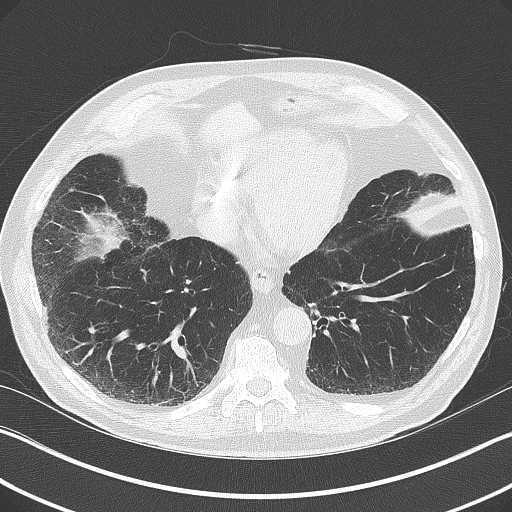
[im 107/160  lung]
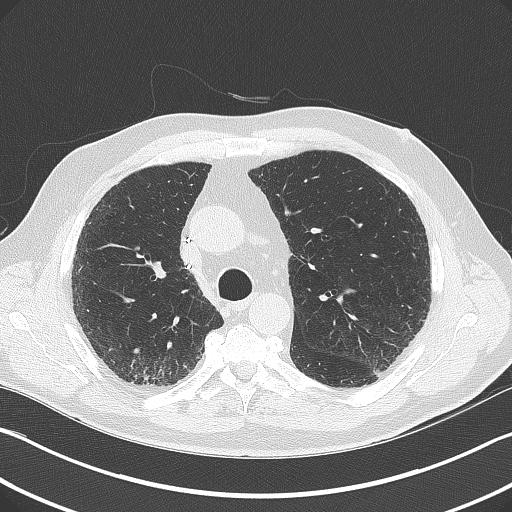

[Series 7: high (id) · axial · 0.71mm/px · z∈[-352,-124]mm · 6 of 320 slices shown, 8 images]
[im 46/320  mediastinal]
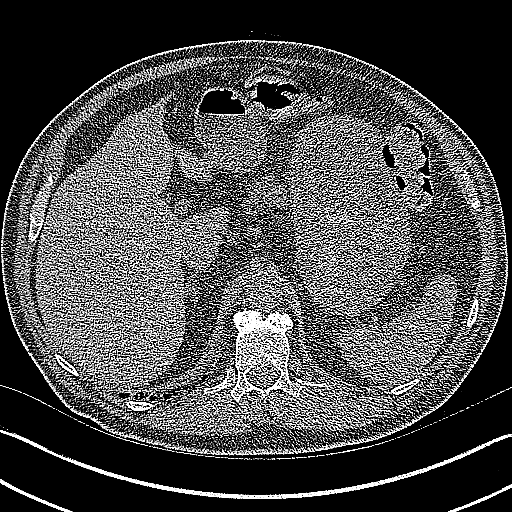
[im 46/320  lung]
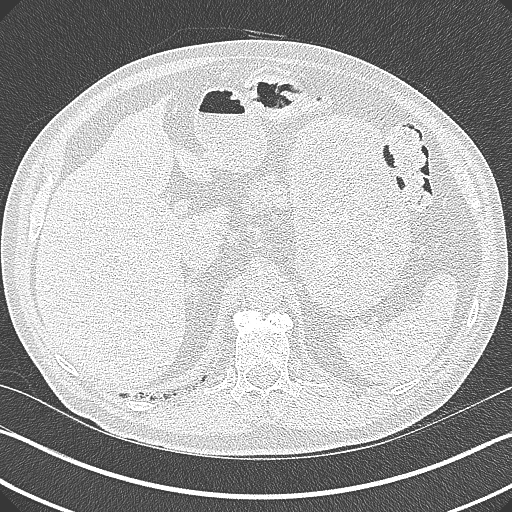
[im 92/320  lung]
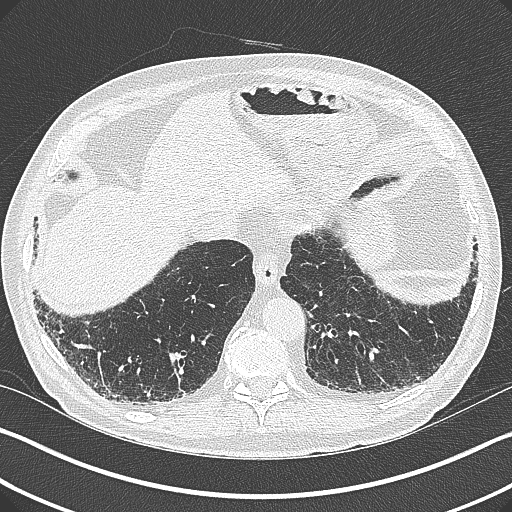
[im 137/320  lung]
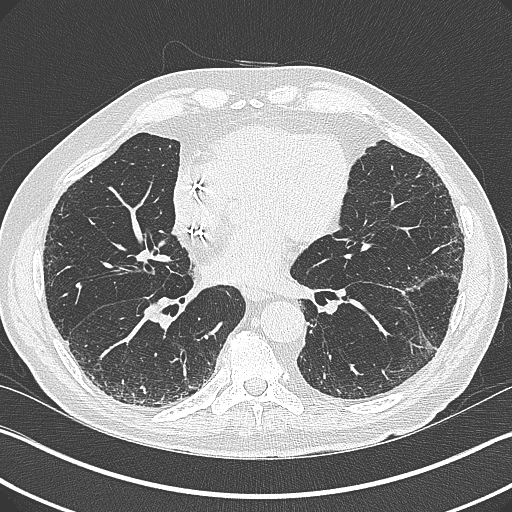
[im 183/320  lung]
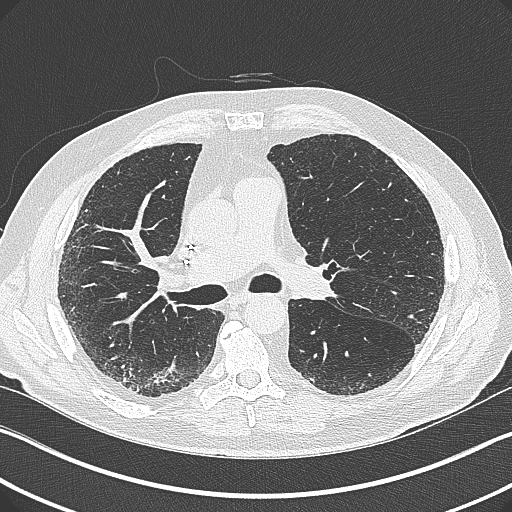
[im 228/320  mediastinal]
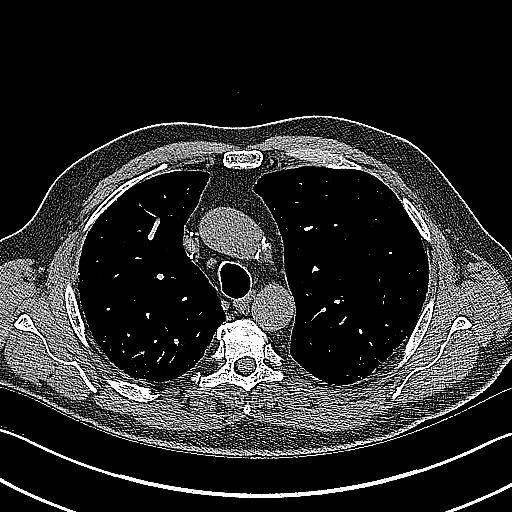
[im 228/320  lung]
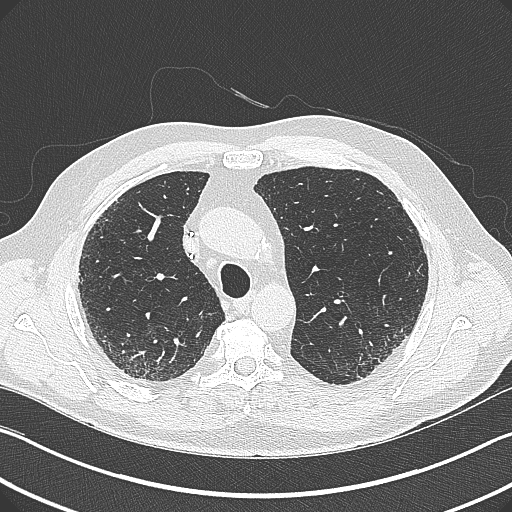
[im 274/320  lung]
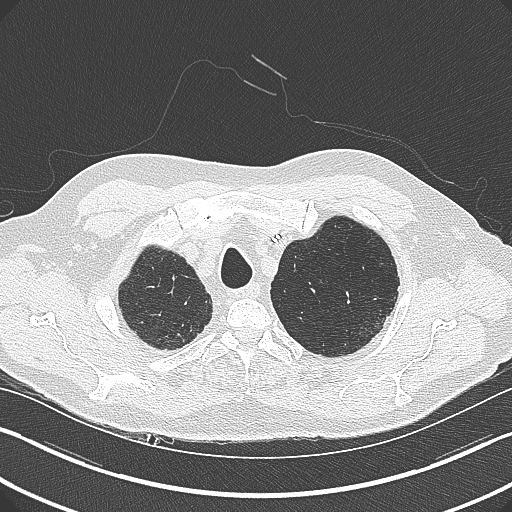

[Series 9: high res coronal · coronal · 0.64mm/px · 3 of 133 slices shown]
[im 27/133  lung]
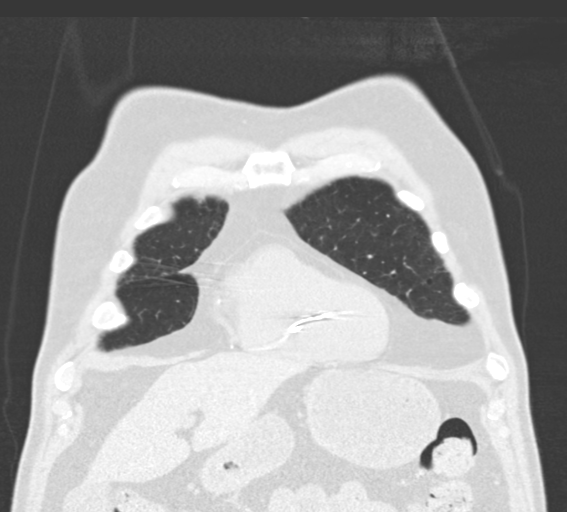
[im 53/133  lung]
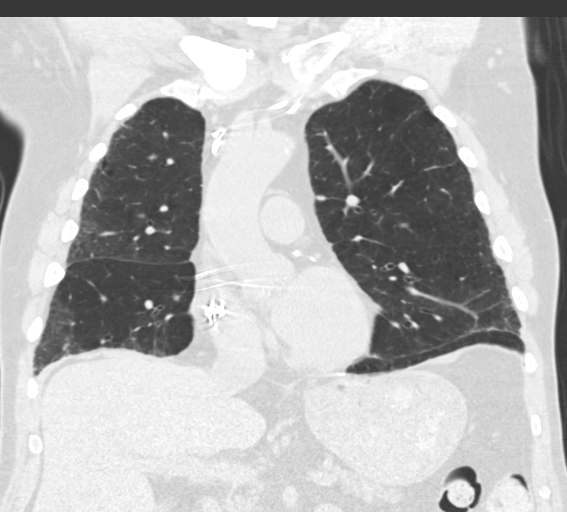
[im 80/133  lung]
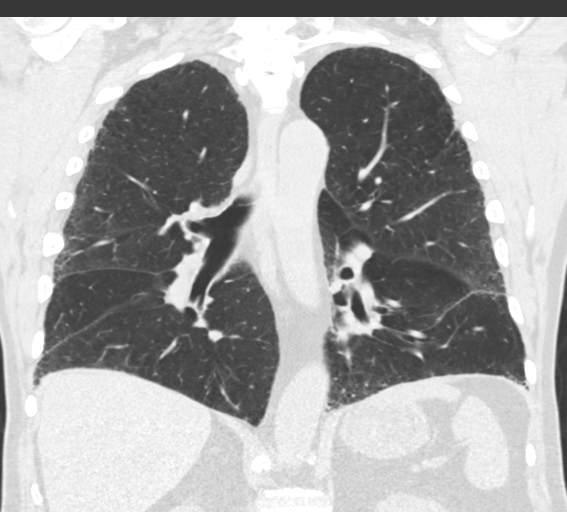

[13 of 36 positions shown; findings below may reference images not displayed]

FINDINGS: Cardiovascular: Heart size is normal. There is no significant
pericardial fluid, thickening or pericardial calcification. There is
aortic atherosclerosis, as well as atherosclerosis of the great
vessels of the mediastinum and the coronary arteries, including
calcified atherosclerotic plaque in the left main, left anterior
descending, left circumflex and right coronary arteries.
Calcifications of the aortic valve. Left-sided pacemaker/AICD with
lead tips terminating in the right atrium and right ventricular
apex.

Mediastinum/Nodes: No pathologically enlarged mediastinal or hilar
lymph nodes. Please note that accurate exclusion of hilar adenopathy
is limited on noncontrast CT scans. Esophagus is unremarkable in
appearance. No axillary lymphadenopathy.

Lungs/Pleura: High-resolution images demonstrate widespread areas of
mild peripheral predominant ground-glass attenuation, septal
thickening, subpleural reticulation and traction bronchiectasis.
Findings have a mild craniocaudal gradient. No definite
honeycombing. Inspiratory and expiratory imaging is unremarkable. No
acute consolidative airspace disease. No pleural effusions. No
definite suspicious appearing pulmonary nodules or masses are noted.

Upper Abdomen: Aortic atherosclerosis.

Musculoskeletal: There are no aggressive appearing lytic or blastic
lesions noted in the visualized portions of the skeleton.
IMPRESSION: 1. The appearance of the lungs is compatible with interstitial lung
disease, with a spectrum of findings considered probable usual
interstitial pneumonia (UIP) per current ATS guidelines.
2. Aortic atherosclerosis, in addition to left main and 3 vessel
coronary artery disease. Assessment for potential risk factor
modification, dietary therapy or pharmacologic therapy may be
warranted, if clinically indicated.
3. There are calcifications of the aortic valve. Echocardiographic
correlation for evaluation of potential valvular dysfunction may be
warranted if clinically indicated.

Aortic Atherosclerosis (QWWP1-86J.J).

ADDENDUM:
When compared to prior study 08/11/2020, there is minimal
progression of disease.

*** End of Addendum ***
FINDINGS: Cardiovascular: Heart size is normal. There is no significant
pericardial fluid, thickening or pericardial calcification. There is
aortic atherosclerosis, as well as atherosclerosis of the great
vessels of the mediastinum and the coronary arteries, including
calcified atherosclerotic plaque in the left main, left anterior
descending, left circumflex and right coronary arteries.
Calcifications of the aortic valve. Left-sided pacemaker/AICD with
lead tips terminating in the right atrium and right ventricular
apex.

Mediastinum/Nodes: No pathologically enlarged mediastinal or hilar
lymph nodes. Please note that accurate exclusion of hilar adenopathy
is limited on noncontrast CT scans. Esophagus is unremarkable in
appearance. No axillary lymphadenopathy.

Lungs/Pleura: High-resolution images demonstrate widespread areas of
mild peripheral predominant ground-glass attenuation, septal
thickening, subpleural reticulation and traction bronchiectasis.
Findings have a mild craniocaudal gradient. No definite
honeycombing. Inspiratory and expiratory imaging is unremarkable. No
acute consolidative airspace disease. No pleural effusions. No
definite suspicious appearing pulmonary nodules or masses are noted.

Upper Abdomen: Aortic atherosclerosis.

Musculoskeletal: There are no aggressive appearing lytic or blastic
lesions noted in the visualized portions of the skeleton.
IMPRESSION: 1. The appearance of the lungs is compatible with interstitial lung
disease, with a spectrum of findings considered probable usual
interstitial pneumonia (UIP) per current ATS guidelines.
2. Aortic atherosclerosis, in addition to left main and 3 vessel
coronary artery disease. Assessment for potential risk factor
modification, dietary therapy or pharmacologic therapy may be
warranted, if clinically indicated.
3. There are calcifications of the aortic valve. Echocardiographic
correlation for evaluation of potential valvular dysfunction may be
warranted if clinically indicated.

Aortic Atherosclerosis (QWWP1-86J.J).

## 2022-09-26 NOTE — Telephone Encounter (Signed)
Received a fax regarding Prior Authorization from [facility] for [place]. Authorization has been DENIED because patient must try and fail 2 formulary agents: Pirfenidone and Ofev.  Faxed denial to [place] on 09/25/21 to see if he can qualify to continue patient assistance for denial.  Spoke to patient and he would like to see what [employer]'s decision will be, then if denied PAP. He would like to look into filling generic through [facility] pharmacy.  Patient says he has a good amount of med on hand.

## 2022-09-27 ENCOUNTER — Other Ambulatory Visit: Payer: Self-pay | Admitting: *Deleted

## 2022-09-27 ENCOUNTER — Encounter: Payer: Medicare Other | Admitting: *Deleted

## 2022-09-27 ENCOUNTER — Telehealth: Payer: Self-pay | Admitting: Internal Medicine

## 2022-09-27 ENCOUNTER — Encounter (INDEPENDENT_AMBULATORY_CARE_PROVIDER_SITE_OTHER): Payer: Medicare Other | Admitting: Adult Health

## 2022-09-27 DIAGNOSIS — Z006 Encounter for examination for normal comparison and control in clinical research program: Secondary | ICD-10-CM

## 2022-09-27 DIAGNOSIS — J849 Interstitial pulmonary disease, unspecified: Secondary | ICD-10-CM

## 2022-09-27 DIAGNOSIS — J84112 Idiopathic pulmonary fibrosis: Secondary | ICD-10-CM

## 2022-09-27 MED ORDER — VITAMIN D3 1.25 MG (50000 UT) PO CAPS: ORAL_CAPSULE | ORAL | 5 refills | Status: DC

## 2022-09-27 NOTE — Research (Signed)
TITLE: A TWO-COHORT, PHASE II, MULTICENTER, RANDOMIZED, DOUBLE-BLIND, PARALLEL GROUP, PLACEBO-CONTROLLED STUDY EVALUATING THE EFFICACY AND SAFETY OF VIXARELIMAB COMPARED WITH [place] IN PATIENTS WITH IDIOPATHIC PULMONARY FIBROSIS (COHORT 1) AND IN PATIENTS WITH SYSTEMIC SCLEROSIS ASSOCIATED INTERSTITIAL LUNG DISEASE (COHORT 2)  THIS BLURB is FOR COHORT 1   Protocol #: [id number] NCT: [id number] Sponsor: [employer]  Protocol Version: Version 2 Dated 24Dec2022 IB: Version 5.0 Dated 09Sep2022 ICF:  Main ICF: Version 13Feb2023,  revised [place]: version 13Feb2023, revised 22May23 Open label: version13Feb2023, Revised 05 Jan 2022 Mobile Nursing: version [id number], Revised 11 Apr 2022 Lab Manual: V2.1.0 Revised [id number]  Study Design:  This is a two-cohort, Phase II, multicenter, randomized, double-blind, parallel-group, placebo-controlled study  patients will be randomized 1:1 to receive [place] injections of vixarelimab 360 mg or placebo Q2W for 26 doses over 52 weeks, follow-up visit is approximately 9 weeks after the final dose.   Mechanism of Action Vixarelimab is a fully human monoclonal antibody (mAb) that targets the cytokine receptor subunit, oncostatin M (OSM) receptor-beta (OSMR?). The OSMR? subunit differentially heterodimerizes to form 2 distinct cytokine receptors: interleukin-31 (IL-31) and OSM. Thus, vixarelimab has the potential to disrupt fibrosis, inflammation pruritus, inflammation, hyperkeratosis, and fibrosis by inhibiting both IL-31 and OSM activities  Administration  Vixarelimab 360 mg or matching placebo will be administered Q2W by subcutaneous injection   Key Inclusion Criteria Both Cohorts FVC  >=  45% predicted  FEV1/FVC ratio >= 0.70  DLCO  30% -  90%  Minimum 6MWT distance of 150 meters with maximum use of 6 L/min at sea-level of supplemental oxygen while maintaining oxygen saturation of  83% during the 6MWT during screening Cohort 1 Age 77-85  years, IPF or IPF (likely) per the 2022 ATS/ERS/JRS/ALAT Clinical Practice Guideline HRCT pattern consistent with the diagnosis of IPF For patients receiving pirfenidone or nintedanib treatment for IPF: treatment for  3 months with a stable dose for  4 weeks prior to screening  For patients not currently receiving nintedanib or pirfenidone treatment (i.e., either treatment naive or have previously taken and discontinued): discontinuation of such treatment  4 weeks prior to screening and during screening with no plans to start or restart therapy during the study period   Key Exclusion Criteria for BOTH cohorts % predicted FVC value improvement in the 71-month prior to screening  Known post-bronchodilator response in FEV1 and/or FVC (defined as an increase by 12% and 200 mL) Resting oxygen saturation of 89% using up to 4 L/min of supplemental oxygen at sea level and up to 6 L/min at altitude during screening. Participation in or planned initiation of a new cardiopulmonary rehabilitation program during the screening period. Maintenance phase of the rehabilitation program are eligible. Inability to refrain from the use of the following Short acting bronchodilators with 4 hours before PFT, DLCO,and 6MWT Once daily or long acting bronchodilators with 24 hours before PFT, DLCO and 6MWT Twice daily, long acting bronchodilators 12 hours before PFT DLCO and 6MWT.  Receipt of systemic (oral, IV, IM, or IA) corticosteroids equivalent to prednisone 10 mg/day or equivalent within 2 weeks prior to screening  Receipt of nintedanib in combination with pirfenidone.  History of Lung transplant Receipt of an investigational drug with in 4 weeks or 5 half lives whichever is longer prior to screening History of severe allergic reaction or anaphylactic reaction to a biologic agent Acute respiratory or systemic bacterial, viral or fungal infection either during screening or prior to screening not successfully resolved  by 4 weeks  prior to screening visit.  Co-existing acute or chronic medical condition that in the investigators opinion would substantially limit the ability to comply with study requirements or may influence any of the safety or efficacy assessments included in the study  Class IV [place] Heart Association chronic heart failure or historical evidence of left ventricular ejection fraction  35%  History or presence of an abnormal ECG that is deemed clinically significant by the investigator, including complete left bundle branch block or second- or third-degree atrioventricular heart block during screening Clinically significant abnormality on laboratory tests during screening (hematology, serum chemistry, and urinalysis) that in the opinion of the investigator may pose an additional risk in administering study drug to the patient. QT interval corrected through use of Fridericia's formula (QTcF) 450 ms if patient is male or QTcF 470 ms if patient is male  For male or male patients with QRS 120: QTcF 480 ms Presence of pulmonary hypertension requiring treatment Major surgery (including joint surgery) within 8 weeks prior to screening or planned major surgery within the study period.  Use of any of the following treatments with in 4 weeks prior to screening Treatment with immunoglobulin or blood products Treatment with any live or attenuated vaccine History of smoking (including cigarette, cannabis, cigar, pipe, and vaping) within 3 months prior to screening History of alcohol or substance use disorder within 2 years prior to screening or known or suspected active alcohol or substance-use disorder Positive Hepatitis C virus antibody test results accompanied by positive HCV RNA test at screening Unacceptable test results of HBsAG, HBaAb, HBcAb at screening.  Known immunodeficiency including, but not limited to I+HIV infection with CD4+ T cell count below normal rand 6 months prior to screening Known  evidence of active or untreated latent TB History of malignancy within the 5 years prior to screening, with the exception of basal cell or squamous cell skin neoplasms- a malignant diagnosis or condition that occurred more than 5 years prior to screening and any basal cell or squamous cell neoplasm must be considered cured, inactive and not under treatment.  Cohort 1 Evidence of other known causes of ILD (e.g., domestic and occupational environmental exposures, drug toxicity, or connective-tissue disease) Emphysema present on  50% of the HRCT, or the extent of emphysema is greater than the extent of fibrosis, according to central review of the HRCT Receiving strong inhibitor or inducer of CYP1A2 in patients taking pirfenidone Receiving potent inhibitor or inducer of P-gp in patients taking nintedanib Received cytotoxic, immunosuppressive, cytokine modulating, or receptor antagonist agents (including but not limited to methotrexate, azathioprine, mycophenolate mofetil, cyclophosphamide, cyclosporine, or other corticosteroid- sparing agent) within 4 weeks of screening  Prohibited Therapies Patients requiring a prohibited therapy will be discontinued from study treatment and will undergo follow- up assessments.   Applicable for Both Cohorts:  All investigational therapies, within 4 weeks (or 5 half-lives, whichever is longer) before screening and during study participation  Any newly approved anti-fibrotic therapy that becomes available during the study  High-dose corticosteroids (equivalent to prednisone > 10 mg daily) unless clinically indicated  as per protocol  Combined treatment with pirfenidone and nintedanib  In the case of a clinically significant deterioration of SSc or IPF, initiation of additional approved therapy is allowed at the discretion of the investigator, if it is deemed necessary on clinical grounds; the Medical Monitor is available to discuss as needed. Detailed information  following such events should be recorded in the eCRF.  In addition, cardiopulmonary rehabilitation programs should  not be initiated during the study.  Cohort 1  Strong inhibitors or inducers of CYP1A2 in patients taking pirfenidone  Strong inhibitors or inducers of P-gp in patients taking nintedanib  Cytotoxic, immunosuppressive, cytokine modulating, or receptor antagonist agents (including but not limited to methotrexate, azathioprine, mycophenolate mofetil, cyclophosphamide, cyclosporine, or other corticosteroid-sparing agent) within 4 weeks prior to baseline (Dosing Day 1) and during the study   Safety/Adverse reactions Vixarelimab is a monoclonal antibody which carries a potential risk of hypersensitivity reactions and anaphylaxis (allergic reactions) or hypersensitivity-like reactions (pseudoallergic reactions). Hypersensitivity reactions will be closely monitored during the study.    Clinical Trials  Overall, vixarelimab has been well tolerated with an acceptable safety profile in healthy volunteers and in patients with AD, PN, and other chronic pruritic conditions. No deaths, reactions, or serious adverse events led to study drug discontinuation.  Two patients with AD discontinued the drug due to disease flare (deemed unrelated to drug), but the drug was not discontinued in other populations. A reported serious adverse event of angioedema was possibly related in a patient with chronic idiopathic pruritus, who recovered with antihistamine and corticosteroid treatment without hospitalization. No deaths occurred in studies. No subject had a confirmed positive test for ADAs.   Overall, most common treatment-emergent adverse events (TEAE) among all studies: headache, atopic dermatitis, URTI  Less frequent TEAEs were decreased appetite, influenza, dizziness, bronchitis, musculoskeletal pain (arthralgia, myalgia), and dermatologic reactions (folliculitis, eczema impetiginous, impetigo, cellulitis,  pruritus), nasal congestion, flushing, anemia    TEAEs from Phase 2a PN study  TEAE, n (%) Vixarelimab (n=23) Placebo (n=26)  Drug related 10 (43.5%) 10 (38.5%)  Infections 10 (43.5%) 16 (61.5%)  URTI 5 (21.7%) 2 (7.7%)  Nasopharyngitis 3 (13.0%) 4 (15.4%)  Skin disorders 7 (30.4%) 4 (15.4%)  Eczema 2 (8.7%) 1 (3.8%)  Urticaria 2 (8.7%) 0  Procedural headache 2 (8.7%) 1 (3.8%)  Headache 3 (13.0%) 3 (11.5%)   PK parameters after 12 weekly [place] doses of vixarelimab showed age, ADAs, sex, and race do not affect PK of drug.   Following 720 mg [place] loading dose: tmax = 6.25 days, Cmax 66.9 ug/mL Following weekly [place] administration of vixarelimab for 8 weeks: tmax = 3.12 days, Cmax = 185 ug/mL. Ratio indicates drug accumulation. Drug was present in plasma in 81% of patients 8 weeks after treatment period.   [facility] [place] Coordinator note:   This visit for [name] with DOB: 11-26-1945 on 09/27/2022 for the above protocol is Visit/Encounter # re-screening and is for purpose of research.   Protocol Version: Version 2 Dated 24Dec2022 IB: Version 5.0 Dated 09Sep2022 ICF:  Main ICF: Version 13Feb2023,  revised [place]: version 13Feb2023, revised 22May23 Open label: version13Feb2023, Revised 05 Jan 2022 Mobile Nursing: version [id number], Revised 11 Apr 2022 Lab Manual: V2.1.0 Revised [id number]  Subject expressed continued interest and consent in continuing as a study subject. Subject confirmed that there was not change in contact information (e.g. address, telephone, email). Subject thanked for participation in research and contribution to science. In this visit 09/27/2022 the subject will be evaluated by Sub-Investigator named Rexene Edison NP. This research coordinator has verified that the above investigator is up to date with his/her training logs.   The Subject was informed that the PI Brand Males MD continues to have oversight of the  subject's visits and course through relevant discussions, reviews, and also specifically of this visit by routing of this note to the PI.  This visit is a key visit of re-screening. The PI is not available for this visit.  Because the PI is NOT available, the Sub-Investigator reported and CRC has confirmed that the PI discussed the visit a-priori with the [facility] during a face to face conversation on 08Jan2024.    Subject came to visit for re-screening as a previous PFT showed improvement in his FVC/FEV1 ratio.  Subject signed consent and completed assessments up until the Spirometry where the ratio was not met.  Subject was again a screen fail.  It was explained to him the reasoning and would be contacted in the future about studies. Subject agreed and no further questions at this time.    Signed by  Jaye Beagle RN  MSN/MBA  Clinical Research Coordinator / Nurse [facility]  [place], [place] 12:00 PM 09/27/2022
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
## 2022-09-27 NOTE — Telephone Encounter (Signed)
    Pls give him the followin rsults from spine xray and bone scan  MPRESSION: Moderate to severe degenerative changes of the lumbar spine. Moderate degenerative changes of the thoracic spine.  Bone scan normal Vit D Low   PLAN - vitamin D3 50,000 (50k) units once a week x 12 weeks, then once a month on first of each month  - this cannot be vitamin d2. But has to to be Vit D3  - If pharmacy only has vitamin d2 let me know, then he should get replesta (spl vit d3) at same dose - replesta can be obtained by following instructions at [email or web address]    Latest Reference Range & Units 08/27/22 11:06  VITD 30.00 - 100.00 ng/mL 24.13 (L)  (L): Data is abnormally low

## 2022-09-27 NOTE — Assessment & Plan Note (Signed)
Consent visit today completed per protocol

## 2022-09-27 NOTE — Telephone Encounter (Signed)
Called and spoke with pt letting him know the results of bloodwork and scans and he verbalized understanding. Rx for Vitamin D3 has been sent to pharmacy for pt. Nothing further needed.

## 2022-09-27 NOTE — Assessment & Plan Note (Signed)
Continue current regimen and follow-up with Dr. Chase Caller

## 2022-10-01 NOTE — Progress Notes (Signed)
[place]:  An Exploratory Assessment of Bone Disease in Idiopathic Pulmonary Fibrosis    Primary Endpoint  The primary objectives of this exploratory study are to broadly estimate: the prevalence and severity of osteopenia and osteoporosis in those with IPF, and the prevalence of vertebral fragility fracture(s) in those with IPF.  Study Code: [id number] Protocol Edition: Number 2.0, Date 09 May 2022 Sponsor Name: [employer] Primary Investigator: Dr. Brand Males  Xxxx  Bone study consent    SIGNATURE    Dr. Brand Males, M.D., F.C.C.P, ACRP-CPI Pulmonary and Critical Care Medicine Research Investigator, [facility] @ Short Staff Physician, [place] Director - Interstitial Lung Disease  Program  [facility] @ [place], [place], [address]   Pager: [phone], If no answer  OR between  19:00-7:00h: page [phone] Telephone (research): [phone]  4:40 PM 10/01/2022   4:40 PM 10/01/2022

## 2022-10-01 NOTE — Patient Instructions (Signed)
Bone scna study

## 2022-10-02 ENCOUNTER — Ambulatory Visit (HOSPITAL_BASED_OUTPATIENT_CLINIC_OR_DEPARTMENT_OTHER): Payer: Medicare Other

## 2022-10-03 ENCOUNTER — Other Ambulatory Visit: Payer: Self-pay | Admitting: Pharmacist

## 2022-10-03 ENCOUNTER — Other Ambulatory Visit (HOSPITAL_COMMUNITY): Payer: Self-pay

## 2022-10-03 DIAGNOSIS — J84112 Idiopathic pulmonary fibrosis: Secondary | ICD-10-CM

## 2022-10-03 MED ORDER — PIRFENIDONE 801 MG PO TABS: 801.0000 mg | ORAL_TABLET | Freq: Three times a day (TID) | ORAL | 2 refills | Status: DC

## 2022-10-03 NOTE — Telephone Encounter (Signed)
Reached out and spoke with pt. He states that he had spoken with a representative this morning who had informed him that the generic had been approved through his insurance, which I was able to verify with a test claim. Copay for 30 days is $305.08.  Explained the situation to pt and discussed temporarily taking the generic to establish a history before once again requesting coverage for Brand-name through insurance and then ultimately resubmitting pt for PAP if an approval can be obtained. Discussed further filling the generic through [facility] pharmacy, however their website indicates that the '801mg'$  tablets are currently unavailable. At that time we made a plan to keep an eye on the situation.  Pt talks it over with his wife and calls back shortly after and requests that we [facility] [address]. Explained that there is a pretty good chance that the local pharmacy may ultimately end up sending the prescription on to a spec pharmacy, and pt verbalized understanding but would like [place] to proceed anyway. Agreed to do so and requested that pt keep [place] in the loop regarding any trouble he may come across between now and physically receiving the medication. Pt verbalized understand and expressed thanks.  Devki will send in the Rx today and await any potential future f/u.

## 2022-10-03 NOTE — Telephone Encounter (Signed)
Patient called and request rx for pirfenidone '801mg'$  three times daily [facility] [address]. He has been advised that they may end up triaging medication to specialty pharmacy. He will reach out with any issues  Knox Saliva, PharmD, MPH, BCPS, CPP Clinical Pharmacist (Rheumatology and Pulmonology)

## 2022-10-04 ENCOUNTER — Encounter: Payer: Self-pay | Admitting: Internal Medicine

## 2022-10-04 NOTE — Telephone Encounter (Signed)
Received notification from [facility] regarding a prior authorization for PIRFENIDONE. Authorization has been APPROVED from 09/29/2022 until furter notice. Approval letter sent to scan center.  Authorization # [id number] Phone # [phone]  Knox Saliva, PharmD, MPH, BCPS, CPP Clinical Pharmacist (Rheumatology and Pulmonology)

## 2022-10-05 NOTE — Telephone Encounter (Signed)
Received call from [facility] stating [facility] on [facility]. Verified that pt does not need loading dose.  Knox Saliva, PharmD, MPH, BCPS, CPP Clinical Pharmacist (Rheumatology and Pulmonology)

## 2022-10-15 ENCOUNTER — Ambulatory Visit: Payer: Medicare Other

## 2022-10-15 DIAGNOSIS — I442 Atrioventricular block, complete: Secondary | ICD-10-CM | POA: Diagnosis not present

## 2022-10-16 DIAGNOSIS — M545 Low back pain, unspecified: Secondary | ICD-10-CM | POA: Diagnosis not present

## 2022-10-16 DIAGNOSIS — R109 Unspecified abdominal pain: Secondary | ICD-10-CM | POA: Diagnosis not present

## 2022-10-16 LAB — CUP PACEART REMOTE DEVICE CHECK
Battery Remaining Longevity: 109 mo
Battery Remaining Percentage: 88 %
Battery Voltage: 3.01 V
Brady Statistic AP VP Percent: 1 %
Brady Statistic AP VS Percent: 1 %
Brady Statistic AS VP Percent: 97 %
Brady Statistic AS VS Percent: 1.5 %
Brady Statistic RA Percent Paced: 1.2 %
Brady Statistic RV Percent Paced: 98 %
Date Time Interrogation Session: 20240205020012
Implantable Lead Connection Status: 753985
Implantable Lead Connection Status: 753985
Implantable Lead Implant Date: 20220427
Implantable Lead Implant Date: 20220427
Implantable Lead Location: 753859
Implantable Lead Location: 753860
Implantable Pulse Generator Implant Date: 20220427
Lead Channel Impedance Value: 450 Ohm
Lead Channel Impedance Value: 460 Ohm
Lead Channel Pacing Threshold Amplitude: 0.875 V
Lead Channel Pacing Threshold Amplitude: 1 V
Lead Channel Pacing Threshold Pulse Width: 0.5 ms
Lead Channel Pacing Threshold Pulse Width: 0.5 ms
Lead Channel Sensing Intrinsic Amplitude: 12 mV
Lead Channel Sensing Intrinsic Amplitude: 4.8 mV
Lead Channel Setting Pacing Amplitude: 1.125
Lead Channel Setting Pacing Amplitude: 2 V
Lead Channel Setting Pacing Pulse Width: 0.5 ms
Lead Channel Setting Sensing Sensitivity: 2 mV
Pulse Gen Model: 2272
Pulse Gen Serial Number: 3920271

## 2022-11-01 DIAGNOSIS — Z7682 Awaiting organ transplant status: Secondary | ICD-10-CM | POA: Diagnosis not present

## 2022-11-02 DIAGNOSIS — H40051 Ocular hypertension, right eye: Secondary | ICD-10-CM | POA: Diagnosis not present

## 2022-11-02 DIAGNOSIS — H40012 Open angle with borderline findings, low risk, left eye: Secondary | ICD-10-CM | POA: Diagnosis not present

## 2022-11-13 DIAGNOSIS — M545 Low back pain, unspecified: Secondary | ICD-10-CM | POA: Diagnosis not present

## 2022-11-13 DIAGNOSIS — R109 Unspecified abdominal pain: Secondary | ICD-10-CM | POA: Diagnosis not present

## 2022-11-22 ENCOUNTER — Other Ambulatory Visit: Payer: Self-pay | Admitting: Internal Medicine

## 2022-11-22 DIAGNOSIS — I2510 Atherosclerotic heart disease of native coronary artery without angina pectoris: Secondary | ICD-10-CM

## 2022-11-22 DIAGNOSIS — R0609 Other forms of dyspnea: Secondary | ICD-10-CM

## 2022-11-28 DIAGNOSIS — H53143 Visual discomfort, bilateral: Secondary | ICD-10-CM | POA: Diagnosis not present

## 2022-11-28 DIAGNOSIS — H35372 Puckering of macula, left eye: Secondary | ICD-10-CM | POA: Diagnosis not present

## 2022-11-28 DIAGNOSIS — H5212 Myopia, left eye: Secondary | ICD-10-CM | POA: Diagnosis not present

## 2022-11-28 DIAGNOSIS — Z961 Presence of intraocular lens: Secondary | ICD-10-CM | POA: Diagnosis not present

## 2022-11-28 DIAGNOSIS — H401111 Primary open-angle glaucoma, right eye, mild stage: Secondary | ICD-10-CM | POA: Diagnosis not present

## 2022-11-28 DIAGNOSIS — H20011 Primary iridocyclitis, right eye: Secondary | ICD-10-CM | POA: Diagnosis not present

## 2022-11-28 DIAGNOSIS — H59812 Chorioretinal scars after surgery for detachment, left eye: Secondary | ICD-10-CM | POA: Diagnosis not present

## 2022-11-28 DIAGNOSIS — H18501 Unspecified hereditary corneal dystrophies, right eye: Secondary | ICD-10-CM | POA: Diagnosis not present

## 2022-11-28 DIAGNOSIS — H33302 Unspecified retinal break, left eye: Secondary | ICD-10-CM | POA: Diagnosis not present

## 2022-11-28 DIAGNOSIS — H1711 Central corneal opacity, right eye: Secondary | ICD-10-CM | POA: Diagnosis not present

## 2022-11-28 DIAGNOSIS — H52222 Regular astigmatism, left eye: Secondary | ICD-10-CM | POA: Diagnosis not present

## 2022-11-29 NOTE — Progress Notes (Signed)
Remote pacemaker transmission.   

## 2022-12-20 ENCOUNTER — Ambulatory Visit (INDEPENDENT_AMBULATORY_CARE_PROVIDER_SITE_OTHER): Payer: Medicare Other | Admitting: Internal Medicine

## 2022-12-20 ENCOUNTER — Encounter: Payer: Self-pay | Admitting: Internal Medicine

## 2022-12-20 VITALS — BP 124/66 | HR 60 | Temp 97.7°F | Ht 66.0 in | Wt 155.2 lb

## 2022-12-20 DIAGNOSIS — J84112 Idiopathic pulmonary fibrosis: Secondary | ICD-10-CM

## 2022-12-20 DIAGNOSIS — Z5181 Encounter for therapeutic drug level monitoring: Secondary | ICD-10-CM

## 2022-12-20 DIAGNOSIS — Z7184 Encounter for health counseling related to travel: Secondary | ICD-10-CM | POA: Diagnosis not present

## 2022-12-20 LAB — PULMONARY FUNCTION TEST
DL/VA % pred: 74 %
DL/VA: 2.99 ml/min/mmHg/L
DLCO cor % pred: 72 %
DLCO cor: 16.03 ml/min/mmHg
DLCO unc % pred: 71 %
DLCO unc: 15.84 ml/min/mmHg
FEF 25-75 Pre: 1.86 L/sec
FEF2575-%Pred-Pre: 102 %
FEV1-%Pred-Pre: 107 %
FEV1-Pre: 2.73 L
FEV1FVC-%Pred-Pre: 100 %
FEV6-%Pred-Pre: 112 %
FEV6-Pre: 3.69 L
FEV6FVC-%Pred-Pre: 106 %
FVC-%Pred-Pre: 105 %
FVC-Pre: 3.73 L
Pre FEV1/FVC ratio: 73 %
Pre FEV6/FVC Ratio: 99 %

## 2022-12-20 MED ORDER — DOXYCYCLINE HYCLATE 100 MG PO TABS: 100.0000 mg | ORAL_TABLET | Freq: Two times a day (BID) | ORAL | 0 refills | Status: DC

## 2022-12-20 MED ORDER — NIRMATRELVIR/RITONAVIR (PAXLOVID)TABLET: 3.0000 | ORAL_TABLET | Freq: Two times a day (BID) | ORAL | 0 refills | Status: DC

## 2022-12-20 MED ORDER — NIRMATRELVIR/RITONAVIR (PAXLOVID)TABLET: 3.0000 | ORAL_TABLET | Freq: Two times a day (BID) | ORAL | 0 refills | Status: AC

## 2022-12-20 MED ORDER — PREDNISONE 10 MG PO TABS: ORAL_TABLET | ORAL | 0 refills | Status: DC

## 2022-12-20 NOTE — Progress Notes (Signed)
OV 08/09/2020  Subjective:  Patient ID: Reginald Davis, male , DOB: 1946/05/12 , age 77 y.o. , MRN: [id number] , ADDRESS: [place] [address] PCP [name], MD Patient Care Team: [name], MD as PCP - General (Internal Medicine) End, Harrell Gave, MD as PCP - Cardiology (Cardiology) Cardiologist is Dr. Ottie Glazier This Provider for this visit: Treatment Team:  Attending Provider: Brand Males, MD    08/09/2020 -   Chief Complaint  Patient presents with   Consult    COPD, DOE     HPI Reginald Davis 77 y.o. -  retired academic an Therapist, sports for a [employer]  History is given by the patient who also brought in very clearly typed notes about his history.  He says in the 1970s and 1980s while he was busy with his professional life he was a smoker and finally quit smoking in 1997.  His total pack smoking history might be 70 years.  He undergoes regular screening CT scans of the chest.  Most recently March 2021 that reported signs of emphysema and a small upper lobe nodule unchanged over many years and possible interstitial lung disease changes.  He says he was at baseline and doing well but in April 2021 he started noticing shortness of breath.  He was placed on Spiriva which improved his symptoms daily.  After that he felt like significantly improved.  He then was dealing with significant difficult to control blood pressures with Dr. Meda Coffee.  She has increased his dosage of the blood pressure medication.  He was remaining active walking approximately thousand steps daily and 30 minutes of walking 5 more days per week.  Then in September 2020 when he and his wife traveled to [place] and then drove to [place] which is an elevation of 4000-4500 feet.  After that they continue to [facility] [place] feet.  They got very short of breath.  Even had a fever episode.  His pulse oximetry dropped to 84-85.  He ended up in the emergency room  and was given a diagnosis of COPD exacerbation.  Given doxycycline and prednisone and then he improved.  Post improvement in September and post return to [facility] he has had difficult to control blood pressure but he does have some residual shortness of breath with exertion relieved by rest.  His apple watch has noticed desaturations at night that are transient with pulse ox ranging 85 to 96%.  His resting heart rate at bed is between 54 and 90 at night.  His current walking desaturation test is below.  He dropped only 2 points but he did get tachycardic.  His pulmonary function test show slight reduction in DLCO but otherwise okay  He follows with Dr. Meda Coffee who noticed he has left lower lobe crackles.  Due to persistent symptoms he has been referred to pulmonary clinic.  He wants to get to the bottom of the issues.  His echocardiogram shows grade 1 diastolic dysfunction.  He does suffer from some visceral obesity.   Of note he has upcoming air travel to [place] on August 15, 2020.  He is going to see his granddaughter who is not seen since the onset of the pandemic.  He believes he may not be traveling to high altitudes in [place].    ADDENDUM: There is a right lower lobe nodule that is seen between vessels in the right lower lobe on image 97 of series 8.  This nodule was compared to studies dating back to 2015 and shows no change. It is possible that this represents a lymph node along bronchovascular structures but again this is stable over a 6 year interval, obscured by surrounding vessels and bronchovascular structures.     Electronically Signed   By: Zetta Bills M.D.   On: 11/27/2019 14:45    Signed by Felipa Emory, MD on 11/27/2019  2:48 PM  Narrative & Impression  CLINICAL DATA:  Left upper lobe nodules, history of bronchiectasis and emphysema.   EXAM: CT CHEST WITHOUT CONTRAST   TECHNIQUE: Multidetector CT imaging of the chest was performed following  the standard protocol without IV contrast.   COMPARISON:  Coronary CT from 08/26/2018, CT chest from 09/20/2017   FINDINGS: Cardiovascular: Calcified atherosclerotic changes in the thoracic aorta without signs of aneurysm. Limited assessment on noncontrast evaluation. The heart size is normal without pericardial effusion. Coronary artery disease with three-vessel calcification as before.   Mediastinum/Nodes: Thoracic inlet structures are normal. No axillary lymphadenopathy. Small right juxta hilar lymph node, 9 mm unchanged since January of 2019 along with other scattered lymph nodes below CT criteria for pathologic enlargement. Limited assessment of hilar structures due to lack of contrast. Esophagus is mildly patulous with some gas in the lumen.   Lungs/Pleura: Small nodule between bronchovascular structures in the central portion of the right lower lobe (image 97, series 8) 11 x 7 mm, within 1 mm of its size in 2019. No dense consolidation. Mild basilar bronchiectasis. Subpleural reticulation as before with signs of centrilobular emphysema.   Tiny nodules in the left upper lobe are stable.   Upper Abdomen: Incidentally imaged portions of upper abdominal contents are unremarkable. No evidence of acute upper abdominal process.   Musculoskeletal: No chest wall mass. Sclerosis of the right clavicular head is unchanged since 2019. Spinal degenerative changes as before.   IMPRESSION: 1. Small nodule between bronchovascular structures in the central portion of the right lower lobe measuring 11 x 7 mm, within 1 mm of its size in 2019. This nodule is within 1 mm of its size in 2019. This nodule is unchanged since 2015 and this therefore benign. 2. Signs of emphysema and chronic interstitial changes with tiny upper lobe nodules are unchanged. 3. Coronary artery disease with three-vessel calcification.   Aortic Atherosclerosis (ICD10-I70.0) and Emphysema (ICD10-J43.9).    Electronically Signed: By: Zetta Bills M.D. On: 11/26/2019 09:11     08/29/2020- Interim hx Patient presents today to review testing and discuss starting anti-fiibrotic medication. CT imaging suggestive of probable UIP with progression since 2015, high suspicion towards IPF. He had negative serology. Dr. Chase Caller recommend starting anti-fibrotic, either Esbriet or OFEV. Accompanied by his wife during today's appointment. He is baseline today, shortness of breath and cough are the same as last visit. He was recently in [place] for 10 days, he did not need to take on hand doxycyline or prednisone. He had an overnight oximetry test on12/2/21 which showed <88% 5 hrs 35 mins, start 2L [place]. He has not received oxygen yet, he was contacted by DME company. He is interested in [employer] continuous oxygen concentrator and is willing to pay out of pocket in needed. He reports daytime oxygen saturation runs between 87-92% RA. He is active, gets an average of 8,000 steps a day. He will be changing medicare part D plans to [employer] silver come January 1st 2022.     OV 09/29/2020  Subjective:  Patient ID: Reginald Grinder  Danton Davis, male , DOB: [birth year] , age 70 y.o. , MRN: [id number] , ADDRESS: [place] [address] PCP [name], MD Patient Care Team: [name], MD as PCP - General (Internal Medicine) End, Harrell Gave, MD as PCP - Cardiology (Cardiology)  This Provider for this visit: Treatment Team:  Attending Provider: Brand Males, MD    09/29/2020 -   Chief Complaint  Patient presents with   Follow-up    6 min walk performed 09/22/20.  Pt states he feels like he has been stable since last visit. Pt is wearing 2L at night.  Pt states if he is on an incline, his sats are dropping some below 88% on room air.    IPF dx givien 08/26/20 based on "probable UIP on CT + negative srology, +  age > 57, _ white + male + prior smoker + heart burn/GERD hx-> this is "IPF"  Associated mild  emphysema present -started Spiriva  GERD hx  Pirfenidone start this pending January 2022/February 2022  HPI Reginald Davis 77 y.o. -returns for follow-up.  He presents with his wife.  Since his last visit in the interim he saw nurse practitioner.  We gave him a diagnosis of IPF based on probable UIP on CT scan age greater than 60, Caucasian ethnicity, male gender, prior smoker, acid reflux history and negative serology.  He has accepted the diagnosis.  He presents with his wife at this point in time.  They have gone through pirfenidone education and started paperwork.  He is waiting for co-pay approval.  At this point in time he feels stable his symptom score is below.  He is completed a 6-minute walk test but he did not desaturate below 88%.  The focus of this visit is multiple questions in general education and answering questions about idiopathic pulmonary fibrosis  -Overall his cough is improved.  He attributes his cough improvement to omeprazole.  However he wants to switch to pantoprazole.  He did some reading and he said that he is concerned about theoretical effects of omeprazole and pirfenidone plasma levels therefore he wants to switch to pantoprazole.  I have supported this and will do the prescription.  -He is very interested in clinical trials as a care option.  We discussed concept of clinical trials being voluntary and what care option meant.  He is probably leaning towards later phase trials.  We gave him consent form for PROMEDIOR Phase 3 and Pliant phase 2 studies but did indicate to him that there is currently a backlog and in addition he will have to be stable on antifibrotic's for a few months before he can enroll.  He is understanding of this.  -He is interested in the patient support group and I have given him the email for a local support group to the pulmonary fibrosis foundation  -He exercises regularly.  However he is interested in joining pulmonary rehabilitation a  referral has been made but he is still waiting to hear from them.  I have emailed the director to get a follow-up.  -We discussed the natural history of pulmonary fibrosis.  Especially IPF.  We discussed its progressive disease.  He and his wife wanted to know about prognostic markers.  Currently these are not commercially available.  In particular is interested in [place].  Indicated that in the next few years, she will test might be available to differentiate prognosis but at this time we would follow clinically.  Currently based on current severity and  stability predicts future progression.  Also explained viral illnesses can exacerbate diseases  - Lung transplantation: He is interested in this concept.  He wants early referral to [facility] so he gets his options covered.  He knows he has to lose weight.  We discussed common barriers for lung transplantation including psychological Support, social support, financial costs.  He says he does not have any of this.  His BMI is 29 he needs to get to less than 27.  Explained to him nutrition is the way to go on this.  He is also going to do rehabilitation   - We also discussed his emphysema component being small.  He will continue with Spiriva  = Oxygenation -he is using nocturnal oxygen.  His apple watch shows his oxygen levels to be normal now at night.  He is monitoring this.  He says that when he exerts heavily his oxygen does drop but on the 6-minute walk test he did not drop.  He will pay for a portable oxygen system on his own out-of-pocket.  Therefore we will do an order for this.      CT Chest data  No results found.    OV 11/10/2020  Subjective:  Patient ID: Reginald Davis, male , DOB: 1946-02-22 , age 27 y.o. , MRN: [id number] , ADDRESS: [place] [address] PCP [name], MD Patient Care Team: [name], MD as PCP - General (Internal Medicine) End, Harrell Gave, MD as PCP - Cardiology (Cardiology)  This  Provider for this visit: Treatment Team:  Attending Provider: Brand Males, MD    11/10/2020 -   Chief Complaint  Patient presents with   Follow-up    Doing well    IPF dx givien 08/26/20 based on "probable UIP on CT + negative srology, +  age > 99, _ white + male + prior smoker + heart burn/GERD hx-> this is "IPF"  -Last CT scan of the chest December 2021  - Esbriet start date -> Oct 26, 2020   - Pulm rehab Mar 2022   Associated mild emphysema present -started Spiriva    HPI Reginald Davis 77 y.o. -presents with his wife. This is for IPF follow-up. He finally got hold of his pirfenidone. He started this 10/26/2020. So far he is tolerating it well. Earlier this week on 11/08/2020 he started pulmonary rehabilitation. He feels stable. Wife feels he is an under perceiver. On a 6-minute walk test he dropped to as low as 89%. He had a pulmonary function test at [facility] that showed decline in DLCO. He is worried about this. We walked him again today and his pulse ox did drop to 88%. That could have been a variation in place but clearly this a difference suggesting progression. However he is tolerating his pirfenidone fine. No side effects as evidenced below in the symptom score. He really wants to start ASAP on clinical trials. He is already met with [facility] for transplant as an option. He had to consent forms one for phase 3 IV infusion PRomedior and another for Phase 2 Pliant Part C. The latter has moved to Part D which is higher dose. He had more questions about this. We went over several components of clinical trials documented below. Explained to him about therapeutic misconception and secondary intent being therapeutic gain. Advised him that the primary intent should always be about drug development and contribution to science. He understood this. Explained to him currently there is supply  chain issues with a phase 3 study. However the phase 2 sutdy is active. However he were to  wait a few months before he is eligible for either study because he just started on pirfenidone. He understands this. We have given him an updated consent form. [facility]FT [facility] fe[facility]22 FVC 3.55L DLCO 11.02    Telephone call 11/30/20   Nov 2021 -> March 2022 with [place]  - FVC up - dlco down  Compared to [facility] feb 2022  -March 2022 with  [place]  - FVC down  - DLCO up   Overall  - would stay stable  - there is mixed signal - nothing to do for now  - allow anti fibrotic kick in  - be patient   OV 12/21/2020  Subjective:  Patient ID: Reginald Davis, male , DOB: 12/28/1945 , age 62 y.o. , MRN: [id number] , ADDRESS: [place] [address] PCP [name], MD Patient Care Team: [name], MD as PCP - General (Internal Medicine) End, Harrell Gave, MD as PCP - Cardiology (Cardiology)  This Provider for this visit: Treatment Team:  Attending Provider: Brand Males, MD    12/21/2020 -   Chief Complaint  Patient presents with   Follow-up    PFT performed 11/25/20.  Pt states he has been doing okay and denies any complaints.     IPF dx givien 08/26/20 based on "probable UIP on CT + negative srology, +  age > 11, + white + male + prior smoker + heart burn/GERD hx-> this is "IPF"  -Last CT scan of the chest December 2021  - Esbriet start date -> Oct 26, 2020   - Pulm rehab Mar 2022  - Clinical trial education - Mar 2022 [facility] D ICF given)  - PFF concept introduction - May 2022   Associated mild emphysema present -started Spiriva  Normal Echo May 2021  HPI Reginald Davis 77 y.o. -returns for follow-up with his wife.  He is now on pirfenidone since mid 01/27/2021 and is tolerating it well.  He reports intentional weight loss.  He has some mild GI side effects that are reflected in the symptom score and this is stable.  He says with a combination of intentional weight loss and rehabilitation and oxygen use [with exertion] his resting heart rate is now in the 60s.  He  showed me his apple watch curve about that.  He is pretty excited about that.  He has had [place] for short of Covid vaccine mRNA.  He is really interested in clinical trials.  He has met with the research coordinator and is scheduled himself for Pliant study visit mid May 2022.  He has read the consent.  He is keeping up with the literature on pulmonary fibrosis.  He is pretty excited about an upcoming IND application approval for the [place] for [employer] BUI [id number].  We will be a study site for this but probably in the fall or the winter 2022.  I told him I do not know much details about this drug as yet.  He says he will start the Pliant study currently.  He wants to know about travel.  He has some prednisone and hand and also antibiotics.  We will go to [place] and [place].  He will take his portable oxygen system with him.  I have approved this travel.  At room air at rest he is fine.  When he exerts himself with treadmill and  exercise he uses 2-3 L pulse.  Most recent liver function test was normal.  This was in December 12, 2020.  He is worried about his progression.  Particular [facility]'s test showed a drop.  He then had pulmonary function test with [place].  I put these results together.  It appears that there is lot of fluctuation particularly with the [facility] test.  But on average it appears his DLCO is declined since he has been with [place] but his FVC is stable.   He is enjoying his rehabilitation.  PFT   Subjective:  Patient ID: Reginald Davis, male , DOB: 03/24/1946 , age 31 y.o. , MRN: [id number] , ADDRESS: [place] [address] PCP [name], MD Patient Care Team: [name], MD as PCP - General (Internal Medicine) Constance Haw, MD as PCP - Electrophysiology (Cardiology) Skeet Latch, MD as PCP - Cardiology (Cardiology)  This Provider for this visit: Treatment Team:  Attending Provider: Brand Males,  MD    08/22/2021 -   Chief Complaint  Patient presents with   Follow-up    Pt states he has had diarrhea for the past 6 weeks and is unsure if it is due to his medication.     IPF dx givien 08/26/20 based on "probable UIP on CT + negative srology, +  age > 59, + white + male + prior smoker + heart burn/GERD hx-> this is "IPF"  -Last CT scan of the chest December 2021  - Esbriet start date -> Oct 26, 2020   - Pulm rehab Mar 2022  - Clinical trial education - Mar 2022 [facility] ICF given)     - Pliant study participant Leona Singleton [phone] and was randomized 22Jun2022  - PFF concept introduction - May 2022   Associated mild emphysema present -started Spiriva  Normal Echo May 2021  HPI Reginald Davis 77 y.o. -   IPF: [facility] visit for OPF. On esbrit and also study (IMP v placebo). Has study  visit#10 in 19Dec, then come back for EOT on 30Jan and EOS on 14Feb and then he is done.  From a respiratory standpoint his symptom scores are slowly actually improved.  His FVC also shows slow improvement over time.  In August 2022 he had a walk test at [facility] and this shows improvement in walking distance.  His weight is stable.  He had recent high-resolution CT chest.  Compared to September 2022 his ILD is stable but compared to remote past there is progression.  Overall he is pleased with the symptoms and stability.  He uses oxygen 2 L at night and 3 L pulsed with exercise and while traveling.  He continues on pirfenidone and study drug.  He should finish the study next month or so.  Rash: Sometime around September 2022 or so he developed punctate rash in his forearms and his back.  He says this still persist but it is stable its not any worse.  He is not overly concerned about this  Main new issue is that since the last 6 weeks he has had diarrhea.  Started as mild but since then is gotten progressive.  He says currently it is severe.  He is having a lot of flatulence and when he has  significant amount of flatulence he is having some amount of stool in his underwear.  For the last 3 weeks he is taking align probiotic.  For the last 1 week he is also  taking a protein shake but he says there is no sweetener in it.  For the last 1 week he is started Imodium.  After this diarrhea is better.  He still having diarrhea 3-4 times a day.  He is taking Imodium 3-4 times a day.  The stool is definitely more formed but it still worrisome for him.  He says he is cut down on nuts and fruits.  He denies any artificial sweetener.  Did medication review for diarrhea causing drugs: The only new introduction is a study drug and prior to that was the pirfenidone.  However he is on other drugs chronically that can cause diarrhea and this includes colchicine greater than 20%, Diovan greater than 5% and PPI.  Initially did not complain about pain but when I asked him he said sometimes he has some right lower quadrant discomfort.  There is no fever or bloody stools.  He had recent safety labs on 08/15/2021.  I personally reviewed this and this is normal.     Upcoming travel: He is plan to go to [place] in January 2023.  This is for 1-2 weeks and is to visit his daughter and his only grandchild.  He wants his diarrhea significantly addressed before the trip.   OV 11/28/2021  Subjective:  Patient ID: Reginald Davis, male , DOB: [birth year] , age 89 y.o. , MRN: [id number] , ADDR[place]: [address] [place] [id number] PCP Rodrigo RanPerini, Mark, MD Patient Care Team: Rodrigo RanPerini, Mark, MD as PCP - General (Internal Medicine) Regan Lemmingamnitz, Will Martin, MD as PCP - Electrophysiology (Cardiology) Chilton Siandolph, Tiffany, MD as PCP - Cardiology (Cardiology)  This Provider for this visit: Treatment Team:  Attending Provider: Kalman Shanamaswamy, Ambriana Selway, MD    11/28/2021 -   Chief Complaint  Patient presents with   Follow-up    Pt states he has been doing okay since last visit.   HPI Reginald HalonRobert C Davis 77 y.o. -returns for follow-up.   Presents with his wife.  He tells me that he is doing well overall.  After coming off the pliant study in February 2023 for 2 weeks he thought he had increased nocturnal desaturations but then this settled out and he is better.  He is intentionally lost weight.  He did go [facility]01[facility].  His 6-minute walking test is improved.  His pulmonary function test also has improved [see below].  He attributes this improvement to being on the research study protocol.  We do not know if he was randomized to placebo or the actual medicine of product.  He believes he was randomized to the actual medicine product.  He did have some increased diarrhea during the study and he says this is now improved.  He is interested in more studies.  Although the current improvement in diarrhea is also because he is taking Imodium on a scheduled basis.  He continues to use 2 L at night and 3 L with exercise although 6-minute walk test had an improved distance and did not desaturate below 91% and he [facility].  He is interested in more clinical trials.  We we discussed a phase 2 oral anticollagen inhibitor study by [employer] sponsor.  He has taken the consent to review.  He is also interested in the future injection study that he came across in the Internet.  It is sponsored by [employer].  Were not a location for the study at this point in time but did indicate  to him that we are in communication and do not have much information to share with him about that.  He is going to read the Internet and let us know what study he wants to do.  He continues to have dry skin.  We talked about flaxseed  He has tended travel to [place] in July 2023.  He wants [place] Paxlovid refilled.  He also wants doxycycline and prednisone refill for the trip.  I agreed to do that.    Subjective:  Patient ID: Reginald Davis, male , DOB: [birth year] , age 65 y.o. , MRN: [id number] , ADDRESS: [address] [place] [address] PCP Rodrigo Ran, MD Patient Care Team: Rodrigo Ran, MD as PCP - General (Internal Medicine) Regan Lemming, MD as PCP - Electrophysiology (Cardiology) Chilton Si, MD as PCP - Cardiology (Cardiology)  This Provider for this visit: Treatment Team:  Attending Provider: Kalman Shan, MD    03/02/2022 -   Chief Complaint  Patient presents with   Follow-up    Pt here for f/u on ILD, states breathing has been good some SOB increased coughing.      HPI ROWEN WILMER 77 y.o. -returns for follow-up.  He has completed his pliant research protocol in February 2023.  He continues his as..  He uses 2 L nasal cannula at night.  He uses oxygen with exertion.  Symptom score shows stable.  Pulmonary function test is stable.  However he feels that at night his Apple Watch on 2 L nasal cannula showing more desaturations than usual.  We did discuss about getting this formally tested with a overnight pulse oximetry to see if he would need more oxygen.  He is agreeable to this plan.  I did indicate to him that given stability in symptoms and pulmonary function test and the walking desaturation test  -pretest probably due for him desaturating at night more than usual would be low.  Is also willing to get echocardiogram and a BNP check.  Depending on this we might have to consider right heart catheterization if for WHO group 3 pulmonary hypertension is in the differential diagnosis.  But overall I think the probably of pulmonary hypertension is also low given the stability.   He continues to be interested in research protocols.  He is particularly interested in a study called  MOONSCAPE -which involves the STAT3 pathway against IPF and it would involve a subcutaneous injection.  He is reading a consent form.  He is willing to participate if he passes on prescreen.    He also believes that his acid reflux is slightly more active.  He is taking his PPI.  He is doing all  the other measures.  But did notice that he is taking fish oil.  He takes this for his hyperlipidemia.  I did indicate to him side effect of fish oil as acid reflux.  He is now on Praluent for his hyperlipidemia which is working really well.  Therefore he is willing to stop the fish oil.   In terms of dry skin: He takes some flaxseed he is putting a new lotion and this is better.  In terms of travel he is going to go to [place] again to see his grandchild.  This will be in July 2023.  In terms of his complete heart block: He is on pacer but he believes the pacer is more active now.  OV 06/07/2022  Subjective:  Patient ID: Reginald Davis,  male , DOB: December 11, 1945 , age 32 y.o. , MRN: [id number] , ADDRESS: [address] [place] [address] PCP Rodrigo Ran, MD Patient Care Team: Rodrigo Ran, MD as PCP - General (Internal Medicine) Regan Lemming, MD as PCP - Electrophysiology (Cardiology) Chilton Si, MD as PCP - Cardiology (Cardiology)  This Provider for this visit: Treatment Team:  Attending Provider: Kalman Shan, MD    06/07/2022 -   Chief Complaint  Patient presents with   Follow-up    PFT was cancelled by pt.  Pt states he has had a few episodes where he has noticed lower O2 sats than before which was seen on walk test done by [facility]. Pt also states he has had a little more coughing than usual.     HPI Reginald Davis 77 y.o. -returns for follow-up this is standard of care visit.  I saw him 2 days ago as a research visit.  He enrolled into a new study.  The study involved a subcutaneous injection he signed consent.  He had screening pulmonary function test documented below.  For the first time his FEV1 FVC ratio is below 70.  He did stop Spiriva before the breathing test based on protocol requirements.  In review of his pulmonary function test he had end of August another pulmonary function test at [facility] and even that the ratio has gone down.  Prior to that his  FEV1 FVC ratio is always over 70.  The first time this is gone down.  I did indicate to him I do not understand why but I have a few patients this is happened.  He feels stable.  His 6-minute walk test at [facility] was stable.  His symptom scores are stable.  Uses oxygen with exercise.  His wife wanted me to caution him not to over exercise and desaturate too much.  I did tell him to keep his pulse ox over 88%.  He is very interested in clinical trials but he understands that if his FEV1 FVC ratio is less than 70 he will disqualify for many trials.  He says he has come to terms with this.  He is on Esbriet and is tolerating this well without any problems.  He did have [facility] lung transplant visit I reviewed those notes.  They feel he is too early for transplant.    OV 08/27/2022  Subjective:  Patient ID: Reginald Davis, male , DOB: 06/19/1946 , age 65 y.o. , MRN: [id number] , ADDRESS: [address] [place] [address] PCP Rodrigo Ran, MD Patient Care Team: Rodrigo Ran, MD as PCP - General (Internal Medicine) Regan Lemming, MD as PCP - Electrophysiology (Cardiology) Chilton Si, MD as PCP - Cardiology (Cardiology)  This Provider for this visit: Treatment Team:  Attending Provider: Kalman Shan, MD    08/27/2022 -   Chief Complaint  Patient presents with   Follow-up    Follow up for IPF. Pt states he has felt stable since last visit. Pt states he is back on his full dose of pirfenidone now and is tolerating it well. Pt is also on Spirivia daily and no issues noted with that and albuterol as needed. Pt states he has not felt any issues with his breathing since last visit Pt also had PFT on 12/13    HPI Reginald Davis 77 y.o. -returns for standard of care visit.  In this visit he tells me that the back spasm he had he went to physical therapy and  they found out that he had a little bit of right shoulder droop from wearing his portable oxygen this then created  contralateral stretch.  Then after intense physical therapy is better.  He feels this is also helped his lung function.  At last visit he was also having diarrhea.  We stopped the pirfenidone but the diarrhea persisted.  We did extensive stool studies and it was normal.  I referred him to Dr. Jeani Hawking, GI and right before he saw Dr. Elnoria Howard he was actually taking Xifaxan which helped resolve the diarrhea.  He was on pirfenidone holiday from early October 2023 through early November 2023 and he has slowly escalated his pirfenidone back up to the full dose.  Initially he had some diarrhea upon starting pirfenidone but now he is tolerating it well.  He is on full dose.  Through all this since September he is lost another 4 pounds of weight.  He feels that when he was off his pirfenidone his weight loss has stabilized.  Currently the BMI is 24.4 and he is at ideal body weight.  He is also exercising aggressively and he attributes some of the weight loss because of this.  He keeps his oxygen levels greater than 88%.  He has a new year pulse oximeter that he allows him to continuously track his pulse ox.  He is up-to-date with his respiratory vaccines.  He is driving to [place] tomorrow to be with his daughter who is visiting him from [place] for Christmas.  He is still interested in research protocols.  He disqualified from a previous research study because of low T1/FVC ratio screening.  Currently standard of care rate she was fine and DLCO is also stable.  He wants to see if he will rescreen.  He is also interested in the study run by [employer] at another site.  He is going to send me some information to read about that particular product.    PFT  OV 12/20/2022  Subjective:  Patient ID: Reginald Davis, male , DOB: Aug 07, 1946 , age 29 y.o. , MRN: [id number] , ADDRESS: [address] [place] [address] PCP Rodrigo Ran, MD Patient Care Team: Rodrigo Ran, MD as PCP - General (Internal  Medicine) Regan Lemming, MD as PCP - Electrophysiology (Cardiology) Chilton Si, MD as PCP - Cardiology (Cardiology)  This Provider for this visit: Treatment Team:  Attending Provider: Kalman Shan, MD   IPF dx givien 08/26/20 based on "probable UIP on CT + negative srology, +  age > 19, + white + male + prior smoker + heart burn/GERD hx-> this is "IPF"  -Last CT scan of the chest December 2022  - Esbriet start date -> Oct 26, 2020   - Pulm rehab Mar 2022  - Clinical trial education - Mar 2022 (Pliant D ICF given)     - Pliant study participant - onsent 02Jun2022 and was randomized 22Jun2022 -0> completed early (feb) 2023   - [facility] SQ Vixarelimab study - Screen failed due to low ratio in fev1/fvc  - PFF concept introduction - May 2022   -National conference November 2023      Associated mild emphysema present -started Spiriva  Normal Echo May 2021 and summer 2023  WEight loss  April 2022: 171#  June 2023: 162#  SEpt 2023: 158# - bmi 25.14 Aug 2022: 154#- bmi 24.13 December 2022-155 pounds  12/20/2022 -   Chief Complaint  Patient presents with   Follow-up  Cleda Daub f/u,      HPI Reginald Davis 77 y.o. -returns for follow-up.  Is a 46-month follow-up.  He believes he is overall stable.  But he is worried that he is desaturating a little bit more easily.  He states that 1 time when he walked downhill after walking the dog his pulse ox dropped.  Sometimes at night he is now needing 3 L nasal cannula to avoid transient desaturations into 89%.  He is also noticed some desaturations when he walks.  This is through the EAR pulse ox oximeter probe..  However objectively there is no evidence that he is getting worse.  His symptom scores remained stable [he believes he might be under perceiving his symptoms].  His pulmonary function test also is stable [see below].  His walking desaturation test is also stable along with a 6-minute walk test at [facility].  We took  a shared decision making for him to do and an exercise distance to desaturation at time to desaturation test on his treadmill at the same particular pace and the same incline and measure this every 2 weeks every 4 weeks is a good objective way to monitor if he is desaturating and getting worse or not.  He is going to do that.     Meanwhile he continues to be interested in research protocols.  He is registered with [facility] and is looking at a protocol with [employer].  He had screen failed with [place] because of FEV1 FVC ratio being low.  He says at the location his PFT ratio was normal and he might screen passed.  He did show his liver function test from that visit and it was normal.  This was in the first week of April 2024.  He also showed a CT report.  There is saying there is early evidence of honeycombing.  However there is no comparison.  I did indicate to him that the report could be inaccurate but also could mean that his IPF is evolving and he is getting honeycombing now.  Regardless his symptoms and pulmonary function test and walk test are stable.  He visited Dr. Karie Mainland 11/02/2022 at [facility] lung [facility] clinic.  For the disease was going very slow progression.  And he might not need a lung transplant given his age and slow progression.  He showed a letter dated 11/14/2022 where at this point in time they are putting a pause on his transplant evaluation  He plans to travel to the [place] end of May 2024: He he wants to take emergency pack of Paxlovid, doxycycline and prednisone.  We will do this for him.   SYMPTOM SCALE - ILD 09/29/2020  11/10/2020 Now on esbriet sinc 10/26/20 12/21/2020 esbriet + 171# 08/22/2021 Esbriet and Pliant study drug , 173# 11/28/2021 Esbriet, off plaint study, 2L [place] night, 3L [place] exercise, 163# intential weight loss 03/02/2022 162# night o2 , ex o2 subjectively 06/07/2022 Uses exercise oxygen 08/27/2022 154#  - diarrhea sept/oc and took esbriet holiday early  oc-nov 2023 and needex xifaxan. BAck on esbrie at time of this visit 12/20/2022   O2 use ra  ra a t rest 2-3L with exercise pulsed 2L [place] at night, 3L pulsed during exerise and travels       Shortness of Breath 0 -> 5 scale with 5 being worst (score 6 If unable to do)          At rest 0.5 0 0.5 1 1 1  0.5 1  1  Simple tasks - showers, clothes change, eating, shaving 1 00 0 0 1 1 1 1 1   Household (dishes, doing bed, laundry) 1 0 0.5 1 2  1.5 1 2 2   Shopping 1 0 0.5 1 2  1.5 1 1.5 1  Walking level at own pace 1.5 1 1  0 2 2 2 2 2   Walking up Stairs *2.25 2 2 2 3 3 2  2.5 3  Total (30-36) Dyspnea Score 7.25 3 6 5 11 10  7.5 10 10   How bad is your cough? Improved with ppi 1 1 ` 1 1 0.5 1 1   How bad is your fatigue 0 0 0.5 ` 1 1 1 1 10   How bad is nausea 0 0 0 0 0 0 0 0 0  How bad is vomiting?  0 00 0 0 0 0 0 0 1  How bad is diarrhea? 0.5 coffee 0 0.25 1 1  0 2.5 1 0  How bad is anxiety? 0.5 0 0.5 1 0 0 0.5 0 0  How bad is depression 0 0 0 0 0 0 0 0    Walk test Simple office walk 185 feet x  3 laps goal with forehead probe 08/09/2020  [facility] 89% and HR111. 1296 fet. 0L o2 needed. Pl JR 111/ done 10/24/20  08/22/2021 - at [facility] 05/01/21 - walked 1671 feet wihout o2. This is an imprement  03/02/2022  163# -11/01/2021 [facility] the 6-minute walk and completed 174 6 feet.  Lowest pulse ox was 91%.  Heart rate was 107.  Did 15 labs.  Did not need oxygen. Aug 2023 at [facility] feet . Did not need o2 08/27/2022  12/20/2022 11/01/2022 6-minute walk test at [facility] 1728 feet/116 % predicted.  Lowest pulse ox was 91% on room air.  Maximum heart rate was 111  O2 used ra ra ra ra  ra ra  Number laps completed 3 3 3 3  3 3   Comments about pace normal nL       Resting Pulse Ox/HR 96% and 80/min 96% and 90.min 99% and 60 97% and HR 62  100% and HR 68 95% and HR 67  Final Pulse Ox/HR 94% and 90/min 88% and 97/min 95% and 94 94% and HR 84  97% and HR 106 92% and HR 72  Desaturated  </= 88% no yes yno no  no no  Desaturated <= 3% points no Yes, 8 points Yes, 4 Yes, 3 points  Yes, 3 points Yes 3 points  Got Tachycardic >/= 90/min yes yes yes no  yes yes  Symptoms at end of test dyspnea Some dyspnea Mild dyspea none  none no  Miscellaneous comments x   Bris pad  stable       PFT     Latest Ref Rng & Units 12/20/2022   12:19 PM 08/22/2022    8:53 AM 03/02/2022    8:49 AM 11/25/2020    8:52 AM 07/12/2020   11:53 AM  PFT Results  FVC-Pre L 3.73  P 3.94  P 3.89  3.43  3.36   FVC-Predicted Pre % 105  P 111  P 108  94  88   FVC-Post L     3.41   FVC-Predicted Post %     90   Pre FEV1/FVC % % 73  P 70  P 71  74  72   Post FEV1/FCV % %     74  FEV1-Pre L 2.73  P 2.76  P 2.75  2.53  2.41   FEV1-Predicted Pre % 107  P 109  P 106  97  88   FEV1-Post L     2.51   DLCO uncorrected ml/min/mmHg 15.84  P 16.25  P 14.76  13.56  16.88   DLCO UNC% % 71  P 73  P 66  60  73   DLCO corrected ml/min/mmHg 16.03  P 16.25  P 14.84  13.45  16.88   DLCO COR %Predicted % 72  P 73  P 67  60  73   DLVA Predicted % 74  P 71  P 66  66  80   TLC L     5.39   TLC % Predicted %     83   RV % Predicted %     65     P Preliminary result      Results for RONALD, LONDO (MRN [id number]) as of 12/21/2020 11:06   Ref. Range 07/12/2020 11:53 10/24/2020 [facility] 11/25/2020 08:52  FVC-Pre Latest Units: L 3.36 3.55 3.43  FVC-%Pred-Pre Latest Units: % 88 100% 94    Results for DATHAN, ATTIA (MRN [id number]) as of 12/21/2020 11:06   Ref. Range 07/12/2020 11:53 10/24/2020 [facility] 11/25/2020 08:52  DLCO cor Latest Units: ml/min/mmHg 16.88 11.02 13.45  DLCO cor % pred Latest Units: % 73 49.3 60      has a past medical history of Burn, Coronary artery disease, Diverticul disease small and large intestine, no perforati or abscess, GERD (gastroesophageal reflux disease), Hernia, History of nuclear stress test, Hyperlipidemia, Low back pain, Oxygen toxicity, Plantar fasciitis, Retinal tear, Right rotator cuff tear,  and Shingles.   reports that he quit smoking about 27 years ago. His smoking use included cigarettes. He has a 40.00 pack-year smoking history. He has never used smokeless tobacco.  Past Surgical History:  Procedure Laterality Date   EYE SURGERY     PACEMAKER IMPLANT N/A 01/04/2021   Procedure: PACEMAKER IMPLANT;  Surgeon: Regan Lemming, MD;  Location: [facility] INVASIVE CV LAB;  Service: Cardiovascular;  Laterality: N/A;   REFRACTIVE SURGERY     retina repair and retina tear   SHOULDER ARTHROSCOPY     SHOULDER SURGERY     TONSILLECTOMY      No Known Allergies  Immunization History  Administered Date(s) Administered   Fluad Quad(high Dose 65+) 06/07/2022   Influenza Split 09/22/2009, 06/01/2010, 06/07/2011, 05/27/2012, 07/28/2013   Influenza, High Dose Seasonal PF 06/15/2015, 06/05/2016, 06/08/2017, 06/07/2018   Influenza, Quadrivalent, Recombinant, Inj, Pf 06/07/2018, 06/12/2019, 06/22/2020, 06/01/2021   Influenza,inj,Quad PF,6+ Mos 06/18/2014   Moderna Covid-19 Vaccine Bivalent Booster 72yrs & up 06/14/2021, 01/05/2022   PFIZER(Purple Top)SARS-COV-2 Vaccination 10/05/2019, 10/27/2019, 06/03/2020   Pneumococcal Conjugate-13 08/12/2013, 06/13/2014   Pneumococcal Polysaccharide-23 11/04/2000, 09/22/2009, 08/16/2010   Respiratory Syncytial Virus Vaccine,Recomb Aduvanted(Arexvy) 05/21/2022   Td 09/10/1998, 09/22/2009, 12/23/2019   Tdap 10/02/2010   Typhoid Inactivated 09/09/2015   Zoster Recombinat (Shingrix) 01/13/2017, 04/26/2017   Zoster, Live 09/16/2007, 09/22/2009    Family History  Problem Relation Age of Onset   Hyperlipidemia Mother    Diabetes Father    Heart disease Father    Coronary artery disease Brother      Current Outpatient Medications:    albuterol (VENTOLIN HFA) 108 (90 Base) MCG/ACT inhaler, Inhale 1-2 puffs into the lungs every 6 (six) hours as needed for wheezing or shortness of breath., Disp: 18 g, Rfl: 1  Alirocumab (PRALUENT) 150 MG/ML SOAJ,  Inject 150 mg as directed every 30 (thirty) days. , Disp: , Rfl:    allopurinol (ZYLOPRIM) 100 MG tablet, Take 1 tablet by mouth 2 (two) times daily., Disp: , Rfl:    Alpha-Lipoic Acid 200 MG CAPS, Take 1 capsule by mouth in the morning and at bedtime., Disp: , Rfl:    aspirin 81 MG tablet, Take 81 mg by mouth daily., Disp: , Rfl:    atorvastatin (LIPITOR) 20 MG tablet, Take 10 mg by mouth at bedtime., Disp: , Rfl:    Cholecalciferol (VITAMIN D3) 1.25 MG (50000 UT) CAPS, Take once a week for 12 weeks and then once a month on the first of each month, Disp: 30 capsule, Rfl: 5   Coenzyme Q10 300 MG CAPS, Take 1 capsule by mouth every morning., Disp: , Rfl: 0   colchicine 0.6 MG tablet, Take 1 tablet by mouth 2 (two) times daily as needed (gout flare)., Disp: , Rfl:    dorzolamide-timolol (COSOPT) 22.3-6.8 MG/ML ophthalmic solution, Place 1 drop into the right eye 2 (two) times daily., Disp: , Rfl:    doxycycline (VIBRA-TABS) 100 MG tablet, Take 1 tablet (100 mg total) by mouth 2 (two) times daily., Disp: 10 tablet, Rfl: 0   fluorometholone (FML) 0.1 % ophthalmic suspension, Place 1 drop into the right eye daily., Disp: , Rfl:    Guaifenesin 1200 MG TB12, Take 1,200 mg by mouth 2 (two) times daily., Disp: , Rfl:    Loperamide-Simethicone 2-125 MG TABS, Take 1 tablet by mouth as needed. Up to 4 times a day., Disp: , Rfl:    Multiple Vitamins-Minerals (CENTRUM SILVER PO), Take 1 tablet by mouth every evening., Disp: , Rfl:    OXYGEN, Inhale 2 L into the lungs., Disp: , Rfl:    Pirfenidone 801 MG TABS, Take 1 tablet (801 mg total) by mouth 3 (three) times daily with meals., Disp: 90 tablet, Rfl: 2   Polyethyl Glycol-Propyl Glycol (SYSTANE) 0.4-0.3 % SOLN, Apply 1 drop to eye daily as needed (dry eyes)., Disp: , Rfl:    predniSONE (DELTASONE) 10 MG tablet, Take 4 tabs for 2 days, then 3 tabs for 2 days, 2 tabs for 2 days, then 1 tab for 2 days, then stop., Disp: 20 tablet, Rfl: 0   SPIRIVA HANDIHALER 18  MCG inhalation capsule, INHALE 1 CAPSULE VIA HANDIHALER ONCE DAILY AT THE SAME TIME EVERY DAY, Disp: 30 capsule, Rfl: 6   triamcinolone cream (KENALOG) 0.1 %, Apply topically in the morning and at bedtime., Disp: , Rfl:    valACYclovir (VALTREX) 1000 MG tablet, Take 1 tablet by mouth daily as needed (fever blisters)., Disp: , Rfl:    valsartan (DIOVAN) 320 MG tablet, TAKE 1 TABLET (320 MG TOTAL) BY MOUTH EVERY EVENING., Disp: 90 tablet, Rfl: 3   vitamin C (ASCORBIC ACID) 500 MG tablet, Take 500 mg by mouth every evening., Disp: , Rfl:    XIFAXAN 550 MG TABS tablet, Take 550 mg by mouth 3 (three) times daily., Disp: , Rfl:    nirmatrelvir/ritonavir (PAXLOVID) 20 x 150 MG & 10 x 100MG  TABS, Take 3 tablets by mouth 2 (two) times daily for 5 days. Patient GFR is normal . Take nirmatrelvir (150 mg) two tablets twice daily for 5 days and ritonavir (100 mg) one tablet twice daily for 5 days., Disp: 30 tablet, Rfl: 0      Objective:   Vitals:   12/20/22 1304  BP: 124/66  Pulse: 60  Temp: 97.7 F (36.5 C)  TempSrc: Oral  SpO2: 95%  Weight: 155 lb 3.2 oz (70.4 kg)  Height: 5\' 6"  (1.676 m)    Estimated body mass index is 25.05 kg/m as calculated from the following:   Height as of this encounter: 5\' 6"  (1.676 m).   Weight as of this encounter: 155 lb 3.2 oz (70.4 kg).  @WEIGHTCHANGE @  [employer]   12/20/22 1304  Weight: 155 lb 3.2 oz (70.4 kg)     Physical Exam    General: No distress. l Neuro: Alert and Oriented x 3. GCS 15. Speech normal Psych: Pleasant Resp:  Barrel Chest - no Wheeze - no, Crackles - yes base, No overt respiratory distress CVS: Normal heart sounds. Murmurs - no Ext: Stigmata of Connective Tissue Disease - no HEENT: Normal upper airway. PEERL +. No post nasal drip        Assessment:       ICD-10-CM   1. IPF (idiopathic pulmonary fibrosis)  J84.112     2. Medication monitoring encounter  Z51.81     3. Travel advice encounter  Z71.84           Plan:     Patient Instructions     ICD-10-CM   1. IPF (idiopathic pulmonary fibrosis)  J84.112     2. Medication monitoring encounter  Z51.81     3. Travel advice encounter  Z71.84        #IPF IPF clinically stable on symptoms and PFT over time LFT in research at [facility] April 2024 - normal Noted concern abot exercise hypoxemia at home - unclear if baseline or worse  Plan   - cotniue esbriet as before - continue daily exercise - keep pulse ox  >-= 88%  - once every few weeks - measure time or distance to desaturation on room air by walking at same pace and incline at treadmill - do spirometry and dlco in 4 months  #Travel Advice to [place]  Take these below in case you fall sick in [place]  plan - Paxlovid 1 course  - Take doxycycline 100mg  po twice daily x 5 days; take after meals and avoid sunlight - Take prednisone 40 mg daily x 2 days, then 20mg  daily x 2 days, then 10mg  daily x 2 days, then 5mg  daily x 2 days and stop    Follow-up - At 63-month standard of care visit with Dr. Marchelle Gearing 30 minutes-but after spirometry and DLCO  -Symptom questionnaire and walk test at follow-up  - cancel [facility] PFT if in a research studty  ( Level 05 visit: Estb 40-54 min   visit type: on-site physical face to visit  in total care time and counseling or/and coordination of care by this undersigned MD - Dr Kalman Shan. This includes one or more of the following on this same day 12/20/2022: pre-charting, chart review, note writing, documentation discussion of test results, diagnostic or treatment recommendations, prognosis, risks and benefits of management options, instructions, education, compliance or risk-factor reduction. It excludes time spent by the CMA or office staff in the care of the patient. Actual time 45 min)   SIGNATURE    Dr. Kalman Shan, M.D., F.C.C.P,  Pulmonary and Critical Care Medicine Staff Physician, [facility] Center Director - Interstitial Lung  Disease  Program  Pulmonary Fibrosis [facility] at [facility] [place], [place], [address]  Pager: [phone], If no answer or between  15:00h - 7:00h: call [phone]  [id number] Telephone: [phone]  2:00 PM 12/20/2022

## 2022-12-20 NOTE — Patient Instructions (Signed)
Spiro/DLCO performed today.  

## 2022-12-20 NOTE — Patient Instructions (Addendum)
ICD-10-CM   1. IPF (idiopathic pulmonary fibrosis)  J84.112     2. Medication monitoring encounter  Z51.81     3. Travel advice encounter  Z71.84        #IPF IPF clinically stable on symptoms and PFT over time LFT in research at [facility] April 2024 - normal Noted concern abot exercise hypoxemia at home - unclear if baseline or worse  Plan   - cotniue esbriet as before - continue daily exercise - keep pulse ox  >-= 88%  - once every few weeks - measure time or distance to desaturation on room air by walking at same pace and incline at treadmill - do spirometry and dlco in 4 months  #Travel Advice to [place]  Take these below in case you fall sick in [place]  plan - Paxlovid 1 course  - Take doxycycline 100mg  po twice daily x 5 days; take after meals and avoid sunlight - Take prednisone 40 mg daily x 2 days, then 20mg  daily x 2 days, then 10mg  daily x 2 days, then 5mg  daily x 2 days and stop    Follow-up - At 11-month standard of care visit with Dr. Marchelle Gearing 30 minutes-but after spirometry and DLCO  -Symptom questionnaire and walk test at follow-up  - cancel [facility] PFT if in a research studty

## 2022-12-20 NOTE — Progress Notes (Signed)
Spiro/DLCO performed today.  

## 2022-12-21 ENCOUNTER — Encounter: Payer: Self-pay | Admitting: Internal Medicine

## 2022-12-21 NOTE — Addendum Note (Signed)
Addended byClyda Greener M on: 12/21/2022 09:41 AM   Modules accepted: Orders

## 2022-12-24 NOTE — Telephone Encounter (Signed)
I have not been able to keep up with the different strains of the COVID-vaccine coming along.  I believe the last 1 is a 2023-2024 Covid Moderna vaccine.  This should be good for 6-9 months.  I think he is okay to travel without another booster as long as he has the antiviral Paxlovid.  However, if there is another mRNA vaccine that is an up-to-date since he got his most recent o he can wait 6 months from the date he got it and get another 1

## 2022-12-25 DIAGNOSIS — R109 Unspecified abdominal pain: Secondary | ICD-10-CM | POA: Diagnosis not present

## 2022-12-25 DIAGNOSIS — M545 Low back pain, unspecified: Secondary | ICD-10-CM | POA: Diagnosis not present

## 2023-01-04 ENCOUNTER — Other Ambulatory Visit (HOSPITAL_BASED_OUTPATIENT_CLINIC_OR_DEPARTMENT_OTHER): Payer: Self-pay

## 2023-01-04 DIAGNOSIS — Z23 Encounter for immunization: Secondary | ICD-10-CM | POA: Diagnosis not present

## 2023-01-04 MED ORDER — COMIRNATY 30 MCG/0.3ML IM SUSY
0.3000 mL | PREFILLED_SYRINGE | Freq: Once | INTRAMUSCULAR | 0 refills | Status: AC
  Filled 2023-01-04: qty 0.3, 1d supply, fill #0

## 2023-01-08 DIAGNOSIS — H35372 Puckering of macula, left eye: Secondary | ICD-10-CM | POA: Diagnosis not present

## 2023-01-08 DIAGNOSIS — H40051 Ocular hypertension, right eye: Secondary | ICD-10-CM | POA: Diagnosis not present

## 2023-01-08 DIAGNOSIS — H33312 Horseshoe tear of retina without detachment, left eye: Secondary | ICD-10-CM | POA: Diagnosis not present

## 2023-01-08 DIAGNOSIS — H35103 Retinopathy of prematurity, unspecified, bilateral: Secondary | ICD-10-CM | POA: Diagnosis not present

## 2023-01-11 DIAGNOSIS — N400 Enlarged prostate without lower urinary tract symptoms: Secondary | ICD-10-CM | POA: Diagnosis not present

## 2023-01-11 DIAGNOSIS — R972 Elevated prostate specific antigen [PSA]: Secondary | ICD-10-CM | POA: Diagnosis not present

## 2023-01-14 ENCOUNTER — Ambulatory Visit (INDEPENDENT_AMBULATORY_CARE_PROVIDER_SITE_OTHER): Payer: Medicare Other

## 2023-01-14 DIAGNOSIS — I442 Atrioventricular block, complete: Secondary | ICD-10-CM | POA: Diagnosis not present

## 2023-01-15 LAB — CUP PACEART REMOTE DEVICE CHECK
Battery Remaining Longevity: 107 mo
Battery Remaining Percentage: 85 %
Battery Voltage: 3.01 V
Brady Statistic AP VP Percent: 1.2 %
Brady Statistic AP VS Percent: 1 %
Brady Statistic AS VP Percent: 98 %
Brady Statistic AS VS Percent: 1.1 %
Brady Statistic RA Percent Paced: 1.3 %
Brady Statistic RV Percent Paced: 99 %
Date Time Interrogation Session: 20240506020013
Implantable Lead Connection Status: 753985
Implantable Lead Connection Status: 753985
Implantable Lead Implant Date: 20220427
Implantable Lead Implant Date: 20220427
Implantable Lead Location: 753859
Implantable Lead Location: 753860
Implantable Pulse Generator Implant Date: 20220427
Lead Channel Impedance Value: 450 Ohm
Lead Channel Impedance Value: 460 Ohm
Lead Channel Pacing Threshold Amplitude: 0.875 V
Lead Channel Pacing Threshold Amplitude: 1 V
Lead Channel Pacing Threshold Pulse Width: 0.5 ms
Lead Channel Pacing Threshold Pulse Width: 0.5 ms
Lead Channel Sensing Intrinsic Amplitude: 12 mV
Lead Channel Sensing Intrinsic Amplitude: 5 mV
Lead Channel Setting Pacing Amplitude: 1.125
Lead Channel Setting Pacing Amplitude: 2 V
Lead Channel Setting Pacing Pulse Width: 0.5 ms
Lead Channel Setting Sensing Sensitivity: 2 mV
Pulse Gen Model: 2272
Pulse Gen Serial Number: 3920271

## 2023-01-22 DIAGNOSIS — E785 Hyperlipidemia, unspecified: Secondary | ICD-10-CM | POA: Diagnosis not present

## 2023-01-22 DIAGNOSIS — R7989 Other specified abnormal findings of blood chemistry: Secondary | ICD-10-CM | POA: Diagnosis not present

## 2023-01-22 DIAGNOSIS — K219 Gastro-esophageal reflux disease without esophagitis: Secondary | ICD-10-CM | POA: Diagnosis not present

## 2023-01-22 DIAGNOSIS — M10072 Idiopathic gout, left ankle and foot: Secondary | ICD-10-CM | POA: Diagnosis not present

## 2023-01-22 DIAGNOSIS — Z125 Encounter for screening for malignant neoplasm of prostate: Secondary | ICD-10-CM | POA: Diagnosis not present

## 2023-01-22 LAB — LAB REPORT - SCANNED
A1c: 5.4
EGFR (Non-African Amer.): 94

## 2023-01-24 DIAGNOSIS — M545 Low back pain, unspecified: Secondary | ICD-10-CM | POA: Diagnosis not present

## 2023-01-24 DIAGNOSIS — R109 Unspecified abdominal pain: Secondary | ICD-10-CM | POA: Diagnosis not present

## 2023-01-29 DIAGNOSIS — J8410 Pulmonary fibrosis, unspecified: Secondary | ICD-10-CM | POA: Diagnosis not present

## 2023-01-29 DIAGNOSIS — Z0000 Encounter for general adult medical examination without abnormal findings: Secondary | ICD-10-CM | POA: Diagnosis not present

## 2023-01-29 DIAGNOSIS — L84 Corns and callosities: Secondary | ICD-10-CM | POA: Diagnosis not present

## 2023-01-29 DIAGNOSIS — I2583 Coronary atherosclerosis due to lipid rich plaque: Secondary | ICD-10-CM | POA: Diagnosis not present

## 2023-01-29 DIAGNOSIS — R82998 Other abnormal findings in urine: Secondary | ICD-10-CM | POA: Diagnosis not present

## 2023-01-29 DIAGNOSIS — J479 Bronchiectasis, uncomplicated: Secondary | ICD-10-CM | POA: Diagnosis not present

## 2023-01-29 DIAGNOSIS — J9611 Chronic respiratory failure with hypoxia: Secondary | ICD-10-CM | POA: Diagnosis not present

## 2023-01-29 DIAGNOSIS — I447 Left bundle-branch block, unspecified: Secondary | ICD-10-CM | POA: Diagnosis not present

## 2023-01-29 DIAGNOSIS — R7301 Impaired fasting glucose: Secondary | ICD-10-CM | POA: Diagnosis not present

## 2023-01-30 DIAGNOSIS — L820 Inflamed seborrheic keratosis: Secondary | ICD-10-CM | POA: Diagnosis not present

## 2023-01-30 DIAGNOSIS — D225 Melanocytic nevi of trunk: Secondary | ICD-10-CM | POA: Diagnosis not present

## 2023-01-30 DIAGNOSIS — D2272 Melanocytic nevi of left lower limb, including hip: Secondary | ICD-10-CM | POA: Diagnosis not present

## 2023-01-30 DIAGNOSIS — L821 Other seborrheic keratosis: Secondary | ICD-10-CM | POA: Diagnosis not present

## 2023-01-30 DIAGNOSIS — D1801 Hemangioma of skin and subcutaneous tissue: Secondary | ICD-10-CM | POA: Diagnosis not present

## 2023-01-30 DIAGNOSIS — L84 Corns and callosities: Secondary | ICD-10-CM | POA: Diagnosis not present

## 2023-01-30 DIAGNOSIS — L570 Actinic keratosis: Secondary | ICD-10-CM | POA: Diagnosis not present

## 2023-01-30 DIAGNOSIS — L812 Freckles: Secondary | ICD-10-CM | POA: Diagnosis not present

## 2023-01-31 ENCOUNTER — Other Ambulatory Visit: Payer: Self-pay | Admitting: Internal Medicine

## 2023-01-31 DIAGNOSIS — J84112 Idiopathic pulmonary fibrosis: Secondary | ICD-10-CM

## 2023-02-12 ENCOUNTER — Ambulatory Visit (INDEPENDENT_AMBULATORY_CARE_PROVIDER_SITE_OTHER): Payer: Medicare Other | Admitting: Podiatry

## 2023-02-12 ENCOUNTER — Ambulatory Visit (INDEPENDENT_AMBULATORY_CARE_PROVIDER_SITE_OTHER): Payer: Medicare Other

## 2023-02-12 VITALS — BP 134/71 | HR 57

## 2023-02-12 DIAGNOSIS — M79671 Pain in right foot: Secondary | ICD-10-CM

## 2023-02-12 DIAGNOSIS — M21621 Bunionette of right foot: Secondary | ICD-10-CM

## 2023-02-12 DIAGNOSIS — Q828 Other specified congenital malformations of skin: Secondary | ICD-10-CM | POA: Diagnosis not present

## 2023-02-12 NOTE — Progress Notes (Signed)
Remote pacemaker transmission.   
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
## 2023-02-12 NOTE — Progress Notes (Deleted)
Bp good yesterday at PCP

## 2023-02-25 ENCOUNTER — Ambulatory Visit: Payer: Medicare Other | Admitting: Podiatry

## 2023-02-27 DIAGNOSIS — M545 Low back pain, unspecified: Secondary | ICD-10-CM | POA: Diagnosis not present

## 2023-02-27 DIAGNOSIS — R109 Unspecified abdominal pain: Secondary | ICD-10-CM | POA: Diagnosis not present

## 2023-03-06 ENCOUNTER — Encounter: Payer: Self-pay | Admitting: Cardiology

## 2023-03-06 ENCOUNTER — Ambulatory Visit: Payer: Medicare Other | Attending: Cardiology | Admitting: Cardiology

## 2023-03-06 VITALS — BP 140/70 | HR 67 | Ht 66.0 in | Wt 162.4 lb

## 2023-03-06 DIAGNOSIS — I10 Essential (primary) hypertension: Secondary | ICD-10-CM | POA: Diagnosis not present

## 2023-03-06 DIAGNOSIS — I442 Atrioventricular block, complete: Secondary | ICD-10-CM

## 2023-03-06 NOTE — Progress Notes (Signed)
  Electrophysiology Office Note:   Date:  03/06/2023  ID:  ZAMIRE WHITEHURST, DOB [birth year], MRN [id number]  Primary Cardiologist: Chilton Si, MD Electrophysiologist: Konstantine Gervasi Jorja Loa, MD      History of Present Illness:   Reginald Davis is a 77 y.o. male with h/o complete heart block seen today for routine electrophysiology followup.  Since last being seen in our clinic the patient reports doing well.  He has noted no further episodes of weakness or fatigue.  He is able to do all of his daily activities.  His activity is restricted by his IPF.  Aside from that, he has no acute complaints..  he denies chest pain, palpitations, dyspnea, PND, orthopnea, nausea, vomiting, dizziness, syncope, edema, weight gain, or early satiety.   Review of systems complete and found to be negative unless listed in HPI.      EP information / Studies Reviewed:    EKG is ordered today. Personal review as below.  EKG Interpretation  Date/Time:  Wednesday March 06 2023 14:19:54 EDT Ventricular Rate:  67 PR Interval:  194 QRS Duration: 168 QT Interval:  430 QTC Calculation: 454 R Axis:   37 Text Interpretation: Atrial-sensed ventricular-paced rhythm When compared with ECG of 31-May-2022 11:02, No significant change since last tracing Confirmed by Saloma Cadena ([id number]) on 03/06/2023 2:54:04 PM   PPM Interrogation-  reviewed in detail today,  See PACEART report.  Device History: Abbott Dual Chamber PPM implanted 01/04/21 for CHB  Risk Assessment/Calculations:     Physical Exam:   VS:  BP (!) 140/70   Pulse 67   Ht 5\' 6"  (1.676 m)   Wt 162 lb 6.4 oz (73.7 kg)   SpO2 96%   BMI 26.21 kg/m    Wt Readings from Last 3 Encounters:  03/06/23 162 lb 6.4 oz (73.7 kg)  12/20/22 155 lb 3.2 oz (70.4 kg)  08/27/22 154 lb (69.9 kg)     GEN: Well nourished, well developed in no acute distress NECK: No JVD; No carotid bruits CARDIAC: Regular rate and rhythm, no murmurs, rubs, gallops RESPIRATORY:  Clear  to auscultation without rales, wheezing or rhonchi  ABDOMEN: Soft, non-tender, non-distended EXTREMITIES:  No edema; No deformity   ASSESSMENT AND PLAN:    CHB s/p Abbott PPM  Normal PPM function See Pace Art report No changes today  Retention: Mildly elevated today.  Usually well-controlled.  No changes.  Disposition:   Follow up with EP APP in 6 months  Signed, Sofiah Lyne Jorja Loa, MD

## 2023-03-08 ENCOUNTER — Other Ambulatory Visit: Payer: Self-pay | Admitting: Podiatry

## 2023-03-08 DIAGNOSIS — Q828 Other specified congenital malformations of skin: Secondary | ICD-10-CM

## 2023-03-08 DIAGNOSIS — M79671 Pain in right foot: Secondary | ICD-10-CM

## 2023-03-08 DIAGNOSIS — M21621 Bunionette of right foot: Secondary | ICD-10-CM

## 2023-03-28 DIAGNOSIS — R109 Unspecified abdominal pain: Secondary | ICD-10-CM | POA: Diagnosis not present

## 2023-03-28 DIAGNOSIS — M545 Low back pain, unspecified: Secondary | ICD-10-CM | POA: Diagnosis not present

## 2023-04-15 ENCOUNTER — Ambulatory Visit: Payer: Medicare Other

## 2023-04-15 DIAGNOSIS — I442 Atrioventricular block, complete: Secondary | ICD-10-CM

## 2023-04-22 ENCOUNTER — Ambulatory Visit: Payer: Medicare Other | Admitting: Internal Medicine

## 2023-04-22 ENCOUNTER — Encounter: Payer: Self-pay | Admitting: Internal Medicine

## 2023-04-22 VITALS — BP 120/80 | HR 58 | Ht 66.0 in | Wt 164.0 lb

## 2023-04-22 DIAGNOSIS — J84112 Idiopathic pulmonary fibrosis: Secondary | ICD-10-CM

## 2023-04-22 DIAGNOSIS — Z5181 Encounter for therapeutic drug level monitoring: Secondary | ICD-10-CM | POA: Diagnosis not present

## 2023-04-22 LAB — HEPATIC FUNCTION PANEL
ALT: 36 U/L (ref 0–53)
AST: 32 U/L (ref 0–37)
Albumin: 4.2 g/dL (ref 3.5–5.2)
Alkaline Phosphatase: 36 U/L — ABNORMAL LOW (ref 39–117)
Bilirubin, Direct: 0.1 mg/dL (ref 0.0–0.3)
Total Bilirubin: 0.4 mg/dL (ref 0.2–1.2)
Total Protein: 6.7 g/dL (ref 6.0–8.3)

## 2023-04-22 NOTE — Patient Instructions (Addendum)
ICD-10-CM   1. IPF (idiopathic pulmonary fibrosis) (HCC)  J84.112     2. Medication monitoring encounter  Z51.81          #IPF IPF clinically stable on symptoms, home exercise test though DLCO might show some early decline On generic pirfenidone + BMS study drug ; tolerating both well without side effect  Plan   - continue pirfenidone as before - continue study drug - check LFT 04/22/2023 - safety monitoring for study drug via [facility]  - continue daily exercise - keep pulse ox  >-= 88%  - once every few weeks - measure time or distance to desaturation on room air by walking at same pace and incline at treadmill - will use spirometry and dlco from study to monitor     Follow-up - At 21-month standard of care visit with Dr. Marchelle Gearing 30 minutes-  -Symptom questionnaire and walk test at follow-up

## 2023-04-22 NOTE — Progress Notes (Signed)
OV 08/09/2020  Subjective:  Patient ID: Reginald Davis, male , DOB: 1945-12-03 , age 77 y.o. , MRN: [id number] , ADDRESS: [address] [place] [address] PCP Rodrigo Ran, MD Patient Care Team: Rodrigo Ran, MD as PCP - General (Internal Medicine) End, Cristal Deer, MD as PCP - Cardiology (Cardiology) Cardiologist is Dr. Andreas Ohm This Provider for this visit: Treatment Team:  Attending Provider: Kalman Shan, MD    08/09/2020 -   Chief Complaint  Patient presents with   Consult    COPD, DOE     HPI Reginald Davis 77 y.o. 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  History is given by the patient who also brought in very clearly typed notes about his history.  He says in the 1970s and 1980s while he was busy with his professional life he was a smoker and finally quit smoking in 1997.  His total pack smoking history might be 70 years.  He undergoes regular screening CT scans of the chest.  Most recently March 2021 that reported signs of emphysema and a small upper lobe nodule unchanged over many years and possible interstitial lung disease changes.  He says he was at baseline and doing well but in April 2021 he started noticing shortness of breath.  He was placed on Spiriva which improved his symptoms daily.  After that he felt like significantly improved.  He then was dealing with significant difficult to control blood pressures with Dr. Delton See.  She has increased his dosage of the blood pressure medication.  He was remaining active walking approximately thousand steps daily and 30 minutes of walking 5 more days per week.  Then in September 2020 when he and his wife traveled to [place] and then drove to [place] which is an elevation of 4000-4500 feet.  After that they continue to [facility] at 6500-7500 feet.  They got very short of breath.  Even had a fever episode.  His pulse oximetry dropped to 84-85.  He ended up in the emergency room  and was given a diagnosis of COPD exacerbation.  Given doxycycline and prednisone and then he improved.  Post improvement in September and post return to [facility] he has had difficult to control blood pressure but he does have some residual shortness of breath with exertion relieved by rest.  His apple watch has noticed desaturations at night that are transient with pulse ox ranging 85 to 96%.  His resting heart rate at bed is between 54 and 90 at night.  His current walking desaturation test is below.  He dropped only 2 points but he did get tachycardic.  His pulmonary function test show slight reduction in DLCO but otherwise okay  He follows with Dr. Delton See who noticed he has left lower lobe crackles.  Due to persistent symptoms he has been referred to pulmonary clinic.  He wants to get to the bottom of the issues.  His echocardiogram shows grade 1 diastolic dysfunction.  He does suffer from some visceral obesity.   Of note he has upcoming air travel to [place] on August 15, 2020.  He is going to see his granddaughter who is not seen since the onset of the pandemic.  He believes he may not be traveling to high altitudes in [place].    ADDENDUM: There is a right lower lobe nodule that is seen between vessels in the right lower lobe on image 97 of series 8.  This nodule was compared to studies dating back to 2015 and shows no change. It is possible that this represents a lymph node along bronchovascular structures but again this is stable over a 6 year interval, obscured by surrounding vessels and bronchovascular structures.     Electronically Signed   By: Donzetta Kohut M.D.   On: 11/27/2019 14:45    Signed by Jule Economy, MD on 11/27/2019  2:48 PM  Narrative & Impression  CLINICAL DATA:  Left upper lobe nodules, history of bronchiectasis and emphysema.   EXAM: CT CHEST WITHOUT CONTRAST   TECHNIQUE: Multidetector CT imaging of the chest was performed following  the standard protocol without IV contrast.   COMPARISON:  Coronary CT from 08/26/2018, CT chest from 09/20/2017   FINDINGS: Cardiovascular: Calcified atherosclerotic changes in the thoracic aorta without signs of aneurysm. Limited assessment on noncontrast evaluation. The heart size is normal without pericardial effusion. Coronary artery disease with three-vessel calcification as before.   Mediastinum/Nodes: Thoracic inlet structures are normal. No axillary lymphadenopathy. Small right juxta hilar lymph node, 9 mm unchanged since January of 2019 along with other scattered lymph nodes below CT criteria for pathologic enlargement. Limited assessment of hilar structures due to lack of contrast. Esophagus is mildly patulous with some gas in the lumen.   Lungs/Pleura: Small nodule between bronchovascular structures in the central portion of the right lower lobe (image 97, series 8) 11 x 7 mm, within 1 mm of its size in 2019. No dense consolidation. Mild basilar bronchiectasis. Subpleural reticulation as before with signs of centrilobular emphysema.   Tiny nodules in the left upper lobe are stable.   Upper Abdomen: Incidentally imaged portions of upper abdominal contents are unremarkable. No evidence of acute upper abdominal process.   Musculoskeletal: No chest wall mass. Sclerosis of the right clavicular head is unchanged since 2019. Spinal degenerative changes as before.   IMPRESSION: 1. Small nodule between bronchovascular structures in the central portion of the right lower lobe measuring 11 x 7 mm, within 1 mm of its size in 2019. This nodule is within 1 mm of its size in 2019. This nodule is unchanged since 2015 and this therefore benign. 2. Signs of emphysema and chronic interstitial changes with tiny upper lobe nodules are unchanged. 3. Coronary artery disease with three-vessel calcification.   Aortic Atherosclerosis (ICD10-I70.0) and Emphysema (ICD10-J43.9).    Electronically Signed: By: Donzetta Kohut M.D. On: 11/26/2019 09:11     08/29/2020- Interim hx Patient presents today to review testing and discuss starting anti-fiibrotic medication. CT imaging suggestive of probable UIP with progression since 2015, high suspicion towards IPF. He had negative serology. Dr. Marchelle Gearing recommend starting anti-fibrotic, either Esbriet or OFEV. Accompanied by his wife during today's appointment. He is baseline today, shortness of breath and cough are the same as last visit. He was recently in [place] for 10 days, he did not need to take on hand doxycyline or prednisone. He had an overnight oximetry test on12/2/21 which showed <88% 5 hrs 35 mins, start 2L [place]. He has not received oxygen yet, he was contacted by DME company. He is interested in [employer] continuous oxygen concentrator and is willing to pay out of pocket in needed. He reports daytime oxygen saturation runs between 87-92% RA. He is active, gets an average of 8,000 steps a day. He will be changing medicare part D plans to [employer] silver come January 1st 2022.     OV 09/29/2020  Subjective:  Patient ID: Reginald Montgomery  Boykin Davis, male , DOB: 1946/02/22 , age 8 y.o. , MRN: [id number] , ADDRESS: [address] [place] [address] PCP Rodrigo Ran, MD Patient Care Team: Rodrigo Ran, MD as PCP - General (Internal Medicine) End, Cristal Deer, MD as PCP - Cardiology (Cardiology)  This Provider for this visit: Treatment Team:  Attending Provider: Kalman Shan, MD    09/29/2020 -   Chief Complaint  Patient presents with   Follow-up    6 min walk performed 09/22/20.  Pt states he feels like he has been stable since last visit. Pt is wearing 2L at night.  Pt states if he is on an incline, his sats are dropping some below 88% on room air.    IPF dx givien 08/26/20 based on "probable UIP on CT + negative srology, +  age > 59, _ white + male + prior smoker + heart burn/GERD hx-> this is "IPF"  Associated mild  emphysema present -started Spiriva  GERD hx  Pirfenidone start this pending January 2022/February 2022  HPI Reginald Davis 77 y.o. -returns for follow-up.  He presents with his wife.  Since his last visit in the interim he saw nurse practitioner.  We gave him a diagnosis of IPF based on probable UIP on CT scan age greater than 41, Caucasian ethnicity, male gender, prior smoker, acid reflux history and negative serology.  He has accepted the diagnosis.  He presents with his wife at this point in time.  They have gone through pirfenidone education and started paperwork.  He is waiting for co-pay approval.  At this point in time he feels stable his symptom score is below.  He is completed a 6-minute walk test but he did not desaturate below 88%.  The focus of this visit is multiple questions in general education and answering questions about idiopathic pulmonary fibrosis  -Overall his cough is improved.  He attributes his cough improvement to omeprazole.  However he wants to switch to pantoprazole.  He did some reading and he said that he is concerned about theoretical effects of omeprazole and pirfenidone plasma levels therefore he wants to switch to pantoprazole.  I have supported this and will do the prescription.  -He is very interested in clinical trials as a care option.  We discussed concept of clinical trials being voluntary and what care option meant.  He is probably leaning towards later phase trials.  We gave him consent form for PROMEDIOR Phase 3 and Pliant phase 2 studies but did indicate to him that there is currently a backlog and in addition he will have to be stable on antifibrotic's for a few months before he can enroll.  He is understanding of this.  -He is interested in the patient support group and I have given him the email for a local support group to the pulmonary fibrosis foundation  -He exercises regularly.  However he is interested in joining pulmonary rehabilitation a  referral has been made but he is still waiting to hear from them.  I have emailed the director to get a follow-up.  -We discussed the natural history of pulmonary fibrosis.  Especially IPF.  We discussed its progressive disease.  He and his wife wanted to know about prognostic markers.  Currently these are not commercially available.  In particular is interested in Kale-6.  Indicated that in the next few years, she will test might be available to differentiate prognosis but at this time we would follow clinically.  Currently based on current severity and  stability predicts future progression.  Also explained viral illnesses can exacerbate diseases  - Lung transplantation: He is interested in this concept.  He wants early referral to [facility] so he gets his options covered.  He knows he has to lose weight.  We discussed common barriers for lung transplantation including psychological Support, social support, financial costs.  He says he does not have any of this.  His BMI is 29 he needs to get to less than 27.  Explained to him nutrition is the way to go on this.  He is also going to do rehabilitation   - We also discussed his emphysema component being small.  He will continue with Spiriva  = Oxygenation -he is using nocturnal oxygen.  His apple watch shows his oxygen levels to be normal now at night.  He is monitoring this.  He says that when he exerts heavily his oxygen does drop but on the 6-minute walk test he did not drop.  He will pay for a portable oxygen system on his own out-of-pocket.  Therefore we will do an order for this.      CT Chest data  No results found.    OV 11/10/2020  Subjective:  Patient ID: Reginald Davis, male , DOB: 01/12/1946 , age 19 y.o. , MRN: [id number] , ADDRESS: [address] [place] [address] PCP Rodrigo Ran, MD Patient Care Team: Rodrigo Ran, MD as PCP - General (Internal Medicine) End, Cristal Deer, MD as PCP - Cardiology (Cardiology)  This  Provider for this visit: Treatment Team:  Attending Provider: Kalman Shan, MD    11/10/2020 -   Chief Complaint  Patient presents with   Follow-up    Doing well    IPF dx givien 08/26/20 based on "probable UIP on CT + negative srology, +  age > 17, _ white + male + prior smoker + heart burn/GERD hx-> this is "IPF"  -Last CT scan of the chest December 2021  - Esbriet start date -> Oct 26, 2020   - Pulm rehab Mar 2022   Associated mild emphysema present -started Spiriva    HPI Reginald Davis 77 y.o. -presents with his wife. This is for IPF follow-up. He finally got hold of his pirfenidone. He started this 10/26/2020. So far he is tolerating it well. Earlier this week on 11/08/2020 he started pulmonary rehabilitation. He feels stable. Wife feels he is an under perceiver. On a 6-minute walk test he dropped to as low as 89%. He had a pulmonary function test at [facility] that showed decline in DLCO. He is worried about this. We walked him again today and his pulse ox did drop to 88%. That could have been a variation in place but clearly this a difference suggesting progression. However he is tolerating his pirfenidone fine. No side effects as evidenced below in the symptom score. He really wants to start ASAP on clinical trials. He is already met with [facility] for transplant as an option. He had to consent forms one for phase 3 IV infusion PRomedior and another for Phase 2 Pliant Part C. The latter has moved to Part D which is higher dose. He had more questions about this. We went over several components of clinical trials documented below. Explained to him about therapeutic misconception and secondary intent being therapeutic gain. Advised him that the primary intent should always be about drug development and contribution to science. He understood this. Explained to him currently there is supply  chain issues with a phase 3 study. However the phase 2 sutdy is active. However he were to  wait a few months before he is eligible for either study because he just started on pirfenidone. He understands this. We have given him an updated consent form. PFT [facility] feb [facility]22 FVC 3.55L DLCO 11.02    Telephone call 11/30/20   Nov 2021 -> March 2022 with [place]  - FVC up - dlco down  Compared to [facility] feb 2022  -March 2022 with  [place]  - FVC down  - DLCO up   Overall  - would stay stable  - there is mixed signal - nothing to do for now  - allow anti fibrotic kick in  - be patient   OV 12/21/2020  Subjective:  Patient ID: Reginald Davis, male , DOB: March 14, 1946 , age 20 y.o. , MRN: [id number] , ADDRESS: [address] [place] [address] PCP Rodrigo Ran, MD Patient Care Team: Rodrigo Ran, MD as PCP - General (Internal Medicine) End, Cristal Deer, MD as PCP - Cardiology (Cardiology)  This Provider for this visit: Treatment Team:  Attending Provider: Kalman Shan, MD    12/21/2020 -   Chief Complaint  Patient presents with   Follow-up    PFT performed 11/25/20.  Pt states he has been doing okay and denies any complaints.     IPF dx givien 08/26/20 based on "probable UIP on CT + negative srology, +  age > 84, + white + male + prior smoker + heart burn/GERD hx-> this is "IPF"  -Last CT scan of the chest December 2021  - Esbriet start date -> Oct 26, 2020   - Pulm rehab Mar 2022  - Clinical trial education - Mar 2022 [facility] ICF given)  - PFF concept introduction - May 2022   Associated mild emphysema present -started Spiriva  Normal Echo May 2021  HPI Reginald Davis 77 y.o. -returns for follow-up with his wife.  He is now on pirfenidone since mid 01/27/2021 and is tolerating it well.  He reports intentional weight loss.  He has some mild GI side effects that are reflected in the symptom score and this is stable.  He says with a combination of intentional weight loss and rehabilitation and oxygen use [with exertion] his resting heart rate is now in the 60s.  He  showed me his apple watch curve about that.  He is pretty excited about that.  He has had [place] for short of Covid vaccine mRNA.  He is really interested in clinical trials.  He has met with the research coordinator and is scheduled himself for Pliant study visit mid May 2022.  He has read the consent.  He is keeping up with the literature on pulmonary fibrosis.  He is pretty excited about an upcoming IND application approval for the pharmaceutical company [employer] for [employer] BUI [id number].  We will be a study site for this but probably in the fall or the winter 2022.  I told him I do not know much details about this drug as yet.  He says he will start the Pliant study currently.  He wants to know about travel.  He has some prednisone and hand and also antibiotics.  We will go to [place] and [place].  He will take his portable oxygen system with him.  I have approved this travel.  At room air at rest he is fine.  When he exerts himself with treadmill and  exercise he uses 2-3 L pulse.  Most recent liver function test was normal.  This was in December 12, 2020.  He is worried about his progression.  Particular [facility]'s test showed a drop.  He then had pulmonary function test with [place].  I put these results together.  It appears that there is lot of fluctuation particularly with the [facility] test.  But on average it appears his DLCO is declined since he has been with [place] but his FVC is stable.   He is enjoying his rehabilitation.  PFT   Subjective:  Patient ID: Reginald Davis, male , DOB: 1946-06-28 , age 32 y.o. , MRN: [id number] , ADDRESS: [address] [place] [address] PCP Rodrigo Ran, MD Patient Care Team: Rodrigo Ran, MD as PCP - General (Internal Medicine) Regan Lemming, MD as PCP - Electrophysiology (Cardiology) Chilton Si, MD as PCP - Cardiology (Cardiology)  This Provider for this visit: Treatment Team:  Attending Provider: Kalman Shan,  MD    08/22/2021 -   Chief Complaint  Patient presents with   Follow-up    Pt states he has had diarrhea for the past 6 weeks and is unsure if it is due to his medication.     IPF dx givien 08/26/20 based on "probable UIP on CT + negative srology, +  age > 89, + white + male + prior smoker + heart burn/GERD hx-> this is "IPF"  -Last CT scan of the chest December 2021  - Esbriet start date -> Oct 26, 2020   - Pulm rehab Mar 2022  - Clinical trial education - Mar 2022 [facility] ICF given)     - Pliant study participant Tyson Alias 02Jun2022 and was randomized 22Jun2022  - PFF concept introduction - May 2022   Associated mild emphysema present -started Spiriva  Normal Echo May 2021  HPI Reginald Davis 77 y.o. -   IPF: [facility] visit for OPF. On esbrit and also study (IMP v placebo). Has study  visit#10 in 19Dec, then come back for EOT on 30Jan and EOS on 14Feb and then he is done.  From a respiratory standpoint his symptom scores are slowly actually improved.  His FVC also shows slow improvement over time.  In August 2022 he had a walk test at [facility] and this shows improvement in walking distance.  His weight is stable.  He had recent high-resolution CT chest.  Compared to September 2022 his ILD is stable but compared to remote past there is progression.  Overall he is pleased with the symptoms and stability.  He uses oxygen 2 L at night and 3 L pulsed with exercise and while traveling.  He continues on pirfenidone and study drug.  He should finish the study next month or so.  Rash: Sometime around September 2022 or so he developed punctate rash in his forearms and his back.  He says this still persist but it is stable its not any worse.  He is not overly concerned about this  Main new issue is that since the last 6 weeks he has had diarrhea.  Started as mild but since then is gotten progressive.  He says currently it is severe.  He is having a lot of flatulence and when he has  significant amount of flatulence he is having some amount of stool in his underwear.  For the last 3 weeks he is taking align probiotic.  For the last 1 week he is also  taking a protein shake but he says there is no sweetener in it.  For the last 1 week he is started Imodium.  After this diarrhea is better.  He still having diarrhea 3-4 times a day.  He is taking Imodium 3-4 times a day.  The stool is definitely more formed but it still worrisome for him.  He says he is cut down on nuts and fruits.  He denies any artificial sweetener.  Did medication review for diarrhea causing drugs: The only new introduction is a study drug and prior to that was the pirfenidone.  However he is on other drugs chronically that can cause diarrhea and this includes colchicine greater than 20%, Diovan greater than 5% and PPI.  Initially did not complain about pain but when I asked him he said sometimes he has some right lower quadrant discomfort.  There is no fever or bloody stools.  He had recent safety labs on 08/15/2021.  I personally reviewed this and this is normal.     Upcoming travel: He is plan to go to [place] in January 2023.  This is for 1-2 weeks and is to visit his daughter and his only grandchild.  He wants his diarrhea significantly addressed before the trip.   OV 11/28/2021  Subjective:  Patient ID: Reginald Davis, male , DOB: 08-02-1946 , age 66 y.o. , MRN: [id number] , ADDRESS: [address] [place] [address] PCP Rodrigo Ran, MD Patient Care Team: Rodrigo Ran, MD as PCP - General (Internal Medicine) Regan Lemming, MD as PCP - Electrophysiology (Cardiology) Chilton Si, MD as PCP - Cardiology (Cardiology)  This Provider for this visit: Treatment Team:  Attending Provider: Kalman Shan, MD    11/28/2021 -   Chief Complaint  Patient presents with   Follow-up    Pt states he has been doing okay since last visit.   HPI Reginald Davis 77 y.o. -returns for follow-up.   Presents with his wife.  He tells me that he is doing well overall.  After coming off the pliant study in February 2023 for 2 weeks he thought he had increased nocturnal desaturations but then this settled out and he is better.  He is intentionally lost weight.  He did go for a lung transplant evaluation at [facility] 11/01/2021.  His 6-minute walking test is improved.  His pulmonary function test also has improved [see below].  He attributes this improvement to being on the research study protocol.  We do not know if he was randomized to placebo or the actual medicine of product.  He believes he was randomized to the actual medicine product.  He did have some increased diarrhea during the study and he says this is now improved.  He is interested in more studies.  Although the current improvement in diarrhea is also because he is taking Imodium on a scheduled basis.  He continues to use 2 L at night and 3 L with exercise although 6-minute walk test had an improved distance and did not desaturate below 91% and he finished 15 laps at [facility].  He is interested in more clinical trials.  We we discussed a phase 2 oral anticollagen inhibitor study by [employer] sponsor.  He has taken the consent to review.  He is also interested in the future injection study that he came across in the Internet.  It is sponsored by [employer].  Were not a location for the study at this point in time but did indicate  to him that we are in communication and do not have much information to share with him about that.  He is going to read the Internet and let us know what study he wants to do.  He continues to have dry skin.  We talked about flaxseed  He has tended travel to [place] in July 2023.  He wants [place] Paxlovid refilled.  He also wants doxycycline and prednisone refill for the trip.  I agreed to do that.    Subjective:  Patient ID: Reginald Davis, male , DOB: 03/24/1946 , age 32 y.o. , MRN: [id number] , ADDRESS: [address] [place] [address] PCP Rodrigo Ran, MD Patient Care Team: Rodrigo Ran, MD as PCP - General (Internal Medicine) Regan Lemming, MD as PCP - Electrophysiology (Cardiology) Chilton Si, MD as PCP - Cardiology (Cardiology)  This Provider for this visit: Treatment Team:  Attending Provider: Kalman Shan, MD    03/02/2022 -   Chief Complaint  Patient presents with   Follow-up    Pt here for f/u on ILD, states breathing has been good some SOB increased coughing.      HPI Reginald Davis 77 y.o. -returns for follow-up.  He has completed his pliant research protocol in February 2023.  He continues his as..  He uses 2 L nasal cannula at night.  He uses oxygen with exertion.  Symptom score shows stable.  Pulmonary function test is stable.  However he feels that at night his Apple Watch on 2 L nasal cannula showing more desaturations than usual.  We did discuss about getting this formally tested with a overnight pulse oximetry to see if he would need more oxygen.  He is agreeable to this plan.  I did indicate to him that given stability in symptoms and pulmonary function test and the walking desaturation test  -pretest probably due for him desaturating at night more than usual would be low.  Is also willing to get echocardiogram and a BNP check.  Depending on this we might have to consider right heart catheterization if for WHO group 3 pulmonary hypertension is in the differential diagnosis.  But overall I think the probably of pulmonary hypertension is also low given the stability.   He continues to be interested in research protocols.  He is particularly interested in a study called  MOONSCAPE -which involves the STAT3 pathway against IPF and it would involve a subcutaneous injection.  He is reading a consent form.  He is willing to participate if he passes on prescreen.    He also believes that his acid reflux is slightly more active.  He is taking his PPI.  He is doing all  the other measures.  But did notice that he is taking fish oil.  He takes this for his hyperlipidemia.  I did indicate to him side effect of fish oil as acid reflux.  He is now on Praluent for his hyperlipidemia which is working really well.  Therefore he is willing to stop the fish oil.   In terms of dry skin: He takes some flaxseed he is putting a new lotion and this is better.  In terms of travel he is going to go to [place] again to see his grandchild.  This will be in July 2023.  In terms of his complete heart block: He is on pacer but he believes the pacer is more active now.  OV 06/07/2022  Subjective:  Patient ID: Reginald Davis,  male , DOB: 05-Dec-1945 , age 75 y.o. , MRN: [id number] , ADDRESS: [address] [place] [address] PCP Rodrigo Ran, MD Patient Care Team: Rodrigo Ran, MD as PCP - General (Internal Medicine) Regan Lemming, MD as PCP - Electrophysiology (Cardiology) Chilton Si, MD as PCP - Cardiology (Cardiology)  This Provider for this visit: Treatment Team:  Attending Provider: Kalman Shan, MD    06/07/2022 -   Chief Complaint  Patient presents with   Follow-up    PFT was cancelled by pt.  Pt states he has had a few episodes where he has noticed lower O2 sats than before which was seen on walk test done by [facility]. Pt also states he has had a little more coughing than usual.     HPI Reginald Davis 77 y.o. -returns for follow-up this is standard of care visit.  I saw him 2 days ago as a research visit.  He enrolled into a new study.  The study involved a subcutaneous injection he signed consent.  He had screening pulmonary function test documented below.  For the first time his FEV1 FVC ratio is below 70.  He did stop Spiriva before the breathing test based on protocol requirements.  In review of his pulmonary function test he had end of August another pulmonary function test at [facility] and even that the ratio has gone down.  Prior to that his  FEV1 FVC ratio is always over 70.  The first time this is gone down.  I did indicate to him I do not understand why but I have a few patients this is happened.  He feels stable.  His 6-minute walk test at [facility] was stable.  His symptom scores are stable.  Uses oxygen with exercise.  His wife wanted me to caution him not to over exercise and desaturate too much.  I did tell him to keep his pulse ox over 88%.  He is very interested in clinical trials but he understands that if his FEV1 FVC ratio is less than 70 he will disqualify for many trials.  He says he has come to terms with this.  He is on Esbriet and is tolerating this well without any problems.  He did have [facility] lung transplant visit I reviewed those notes.  They feel he is too early for transplant.    OV 08/27/2022  Subjective:  Patient ID: Reginald Davis, male , DOB: 1946/07/17 , age 6 y.o. , MRN: [id number] , ADDRESS: [address] [place] [address] PCP Rodrigo Ran, MD Patient Care Team: Rodrigo Ran, MD as PCP - General (Internal Medicine) Regan Lemming, MD as PCP - Electrophysiology (Cardiology) Chilton Si, MD as PCP - Cardiology (Cardiology)  This Provider for this visit: Treatment Team:  Attending Provider: Kalman Shan, MD    08/27/2022 -   Chief Complaint  Patient presents with   Follow-up    Follow up for IPF. Pt states he has felt stable since last visit. Pt states he is back on his full dose of pirfenidone now and is tolerating it well. Pt is also on Spirivia daily and no issues noted with that and albuterol as needed. Pt states he has not felt any issues with his breathing since last visit Pt also had PFT on 12/13    HPI Reginald Davis 77 y.o. -returns for standard of care visit.  In this visit he tells me that the back spasm he had he went to physical therapy and  they found out that he had a little bit of right shoulder droop from wearing his portable oxygen this then created  contralateral stretch.  Then after intense physical therapy is better.  He feels this is also helped his lung function.  At last visit he was also having diarrhea.  We stopped the pirfenidone but the diarrhea persisted.  We did extensive stool studies and it was normal.  I referred him to Dr. Jeani Hawking, GI and right before he saw Dr. Elnoria Howard he was actually taking Xifaxan which helped resolve the diarrhea.  He was on pirfenidone holiday from early October 2023 through early November 2023 and he has slowly escalated his pirfenidone back up to the full dose.  Initially he had some diarrhea upon starting pirfenidone but now he is tolerating it well.  He is on full dose.  Through all this since September he is lost another 4 pounds of weight.  He feels that when he was off his pirfenidone his weight loss has stabilized.  Currently the BMI is 24.4 and he is at ideal body weight.  He is also exercising aggressively and he attributes some of the weight loss because of this.  He keeps his oxygen levels greater than 88%.  He has a new year pulse oximeter that he allows him to continuously track his pulse ox.  He is up-to-date with his respiratory vaccines.  He is driving to [place] tomorrow to be with his daughter who is visiting him from [place] for Christmas.  He is still interested in research protocols.  He disqualified from a previous research study because of low T1/FVC ratio screening.  Currently standard of care rate she was fine and DLCO is also stable.  He wants to see if he will rescreen.  He is also interested in the study run by [employer] at another site.  He is going to send me some information to read about that particular product.    PFT  OV 12/20/2022  Subjective:  Patient ID: Reginald Davis, male , DOB: 03-Jan-1946 , age 65 y.o. , MRN: [id number] , ADDRESS: [address] [place] [address] PCP Rodrigo Ran, MD Patient Care Team: Rodrigo Ran, MD as PCP - General (Internal  Medicine) Regan Lemming, MD as PCP - Electrophysiology (Cardiology) Chilton Si, MD as PCP - Cardiology (Cardiology)  This Provider for this visit: Treatment Team:  Attending Provider: Kalman Shan, MD  12/20/2022 -   Chief Complaint  Patient presents with   Follow-up    Cleda Daub f/u,      HPI Reginald Davis 77 y.o. -returns for follow-up.  Is a 2-month follow-up.  He believes he is overall stable.  But he is worried that he is desaturating a little bit more easily.  He states that 1 time when he walked downhill after walking the dog his pulse ox dropped.  Sometimes at night he is now needing 3 L nasal cannula to avoid transient desaturations into 89%.  He is also noticed some desaturations when he walks.  This is through the EAR pulse ox oximeter probe..  However objectively there is no evidence that he is getting worse.  His symptom scores remained stable [he believes he might be under perceiving his symptoms].  His pulmonary function test also is stable [see below].  His walking desaturation test is also stable along with a 6-minute walk test at [facility].  We took a shared decision making for him to do and an exercise  distance to desaturation at time to desaturation test on his treadmill at the same particular pace and the same incline and measure this every 2 weeks every 4 weeks is a good objective way to monitor if he is desaturating and getting worse or not.  He is going to do that.     Meanwhile he continues to be interested in research protocols.  He is registered with [facility] and is looking at a protocol with [employer].  He had screen failed with [place] because of FEV1 FVC ratio being low.  He says at the location his PFT ratio was normal and he might screen passed.  He did show his liver function test from that visit and it was normal.  This was in the first week of April 2024.  He also showed a CT report.  There is saying there is early evidence of  honeycombing.  However there is no comparison.  I did indicate to him that the report could be inaccurate but also could mean that his IPF is evolving and he is getting honeycombing now.  Regardless his symptoms and pulmonary function test and walk test are stable.  He visited Dr. Karie Mainland 11/02/2022 at [facility] lung transplant clinic.  For the disease was going very slow progression.  And he might not need a lung transplant given his age and slow progression.  He showed a letter dated 11/14/2022 where at this point in time they are putting a pause on his transplant evaluation  He plans to travel to the [place] end of May 2024: He he wants to take emergency pack of Paxlovid, doxycycline and prednisone.  We will do this for him.    OV 04/22/2023  Subjective:  Patient ID: Reginald Davis, male , DOB: 02-Aug-1946 , age 4 y.o. 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/26/20 based on "probable UIP on CT + negative srology, +  age > 61, + white + male + prior smoker + heart burn/GERD hx-> this is "IPF"  -Last CT scan of the chest December 2022  - Esbriet start date -> Oct 26, 2020   - Pulm rehab Mar 2022  - Clinical trial education - Mar 2022 (Pliant D ICF given)     - Pliant study participant - onsent 02Jun2022 and was randomized 22Jun2022 -0> completed early (feb) 2023   - [employer]ixarelim[facility] study - Screen failed due to low ratio in fev1/fvc   -   - PFF concept introduction - May 2022   -National conference November 2023      Associated mild emphysema present -started Spiriva  Normal Echo May 2021 and summer 2023  WEight loss  April 2022:  171#  June 2023: 162#  SEpt 2023: 158# - bmi 25.14 Aug 2022: 154#- bmi 24.13 December 2022-155 pounds  Aug 2024: 164#   Esbriet/Pirfenidone requires intensive drug monitoring due to high concerns for Adverse effects of , including  Drug Induced Liver Injury, significant GI side effects that include but not limited to Diarrhea, Nausea, Vomiting,  and other system side effects that include Fatigue, headaches, weight loss and other side effects such as  skin rash. These will be monitored with  blood work such as LFT initially once a month for 6 months and then quarterly   04/22/2023 -   Chief Complaint  Patient presents with   Follow-up    4 months f/up, no complaints     HPI Reginald Davis 77 y.o. -returns for follow-up.  Presents with his wife Rayfield Citizen.  She is an independent historian.  After the last visit the trip to [place] got canceled because Rayfield Citizen developed non-COVID respiratory viral infection and was very congested.  This then reduce his exercise.  He was also at the beach last week with his family.  His daughter from [place] and his son-in-law visited them.  Also his daughter from near [place] visited them.  He said because of all this he has gained some weight but he is trying to lose it.  His shortness of breath itself is stable.  At home he monitors his distance to desaturation.  On his treadmill with a speed of 2.6 on a incline of 0 he desaturates on 1 minute.  He feels this is stable.  However he is concerned that his 6-minute walk distance has dropped.  In February it was 1728 feet at [facility].  Currently in June 2024 is 1535 feet.  The latter was done at [facility] chest.  He says the hallway was crowded and there were people there and was not a straight line.  Nevertheless his pulse ox did go down to 87%.  He showed me those results.  He also showed his pulmonary function test from [facility] chest as part of the research protocol.  His FVC stable but his DLCO might have  declined.  Overall this conflicting data about his decline.  His symptom score and assistance of desaturation at home are stable but his DLCO seems a little worse and then his 6-minute walk test is also a little bit worse but these are all done at different institutions in different set up.  His current therapy is generic pirfenidone along with the [employer] study drug.  He believes he might be getting placebo although he is blinded because he does not have any side effects.   -His FVC in April 2024 3.6 at the study site.  In July 2024 was 3.78.  His DLCO in June 2024 was 12.28.  I reviewed external records for this that he showed me.'  SYMPTOM SCALE - ILD 09/29/2020  11/10/2020 Now on esbriet sinc 10/26/20 12/21/2020 esbriet + 171# 08/22/2021 Esbriet and Pliant study drug , 173# 11/28/2021 Esbriet, off plaint study, 2L [place] night, 3L [place] exercise, 163# intential weight loss 03/02/2022 162# night o2 , ex o2 subjectively 06/07/2022 Uses exercise oxygen 08/27/2022 154#  - diarrhea sept/oc and took esbriet holiday early oc-nov 2023 and needex xifaxan. BAck on esbrie at time of this visit 12/20/2022  04/22/2023 Generic pirfenidone _ BMS study drug vial [facility]  O2 use ra  ra a t rest 2-3L with exercise pulsed 2L [place] at night, 3L pulsed during exerise and travels        Shortness of Breath 0 -> 5 scale with 5 being worst (score 6 If unable to do)           At rest 0.5 0 0.5 1 1 1  0.5 1 1  0/5  Simple tasks - showers, clothes change, eating, shaving 1 00 0 0 1 1 1 1 1 0   Household (dishes, doing bed, laundry) 1 0 0.5  1 2 1.5 1 2 2 1   Shopping 1 0 0.5 1 2  1.5 1 1.5 1 1   Walking level at own pace 1.5 1 1  0 2 2 2 2 2 1   Walking up Stairs *2.25 2 2 2 3 3 2  2.5 3 1.5  Total (30-36) Dyspnea Score 7.25 3 6 5 11 10  7.5 10 10 5   How bad is your cough? Improved with ppi 1 1 ` 1 1 0.5 1 1 1   How bad is your fatigue 0 0 0.5 ` 1 1 1 1 1  0  How bad is nausea 0 0 0 0 0 0 0 0 0 0   How bad is vomiting?  0  00 0 0 0 0 0 0 1 0  How bad is diarrhea? 0.5 coffee 0 0.25 1 1  0 2.5 1 0 0  How bad is anxiety? 0.5 0 0.5 1 0 0 0.5 0 0 0  How bad is depression 0 0 0 0 0 0 0 0  0   Walk test Simple office walk 185 feet x  3 laps goal with forehead probe 08/09/2020  [facility]% and HR111. 1296 fet. 0L o2 needed. Pl JR 111/ done 10/24/20  08/22/2021 - [facility] 05/01/21 - walked 1671 feet wihout o2. This is an imprement  03/02/2022  163# -11/01/2021 [facility] the 6-minute walk and completed 174 6 feet.  Lowest pulse ox was 91%.  Heart rate was 107.  Did 15 labs.  Did not need oxygen. Aug 2023 at [facility] feet . Did not need o2 08/27/2022  12/20/2022 11/01/2022 6-minute walk test at [facility] 1728 feet/116 % predicted.  Lowest pulse ox was 91% on room air.  Maximum heart rate was 111 February 26, 2023 research at [facility] chest 6-minute walk test -> 468 m [9147 feet].  Started at 92% ended up at 87%.  This was not a straight hallway and may need zigzag  O2 used ra ra ra ra  ra ra   Number laps completed 3 3 3 3  3 3    Comments about pace normal nL        Resting Pulse Ox/HR 96% and 80/min 96% and 90.min 99% and 60 97% and HR 62  100% and HR 68 95% and HR 67   Final Pulse Ox/HR 94% and 90/min 88% and 97/min 95% and 94 94% and HR 84  97% and HR 106 92% and HR 72   Desaturated </= 88% no yes yno no  no no   Desaturated <= 3% points no Yes, 8 points Yes, 4 Yes, 3 points  Yes, 3 points Yes 3 points   Got Tachycardic >/= 90/min yes yes yes no  yes yes   Symptoms at end of test dyspnea Some dyspnea Mild dyspea none  none no   Miscellaneous comments x   Bris pad  stable        PFT     Latest Ref Rng & Units 12/20/2022   12:19 PM 08/22/2022    8:53 AM 03/02/2022    8:49 AM 11/25/2020    8:52 AM 07/12/2020   11:53 AM  ILD indicators  FVC-Pre L 3.73  3.94  3.89  3.43  3.36   FVC-Predicted Pre % 105  111  108  94  88   FVC-Post L     3.41   FVC-Predicted Post %     90   TLC L  5.39    TLC Predicted %     83   DLCO uncorrected ml/min/mmHg 15.84  16.25  14.76  13.56  16.88   DLCO UNC %Pred % 71  73  66  60  73   DLCO Corrected ml/min/mmHg 16.03  16.25  14.84  13.45  16.88   DLCO COR %Pred % 72  73  67  60  73       LAB RESULTS last 96 hours No results found.  LAB RESULTS last 90 days Recent Results (from the past 2160 hour(s))  CUP PACEART REMOTE DEVICE CHECK     Status: None   Collection Time: 04/15/23  2:00 AM  Result Value Ref Range   Date Time Interrogation Session [id number]    Pulse Generator Manufacturer [employer]    Pulse Gen Model 2272 Assurity MRI    Pulse Gen Serial Number [id number]    Clinic Name [facility]    Implantable Pulse Generator Type Implantable Pulse Generator    Implantable Pulse Generator Implant Date [id number]    Implantable Lead Manufacturer [facility]    Implantable Lead Model [id number] Tendril MRI    Implantable Lead Serial Number [id number]    Implantable Lead Implant Date [id number]    Implantable Lead Location Detail 1 UNKNOWN    Implantable Lead Location [id number]    Implantable Lead Connection Status [id number]    Implantable Lead Manufacturer [facility]    Implantable Lead Model [id number] Tendril MRI    Implantable Lead Serial Number [id number]    Implantable Lead Implant Date [id number]    Implantable Lead Location Detail 1 UNKNOWN    Implantable Lead Location [id number]    Implantable Lead Connection Status [id number]    Lead Channel Setting Sensing Sensitivity 2.0 mV   Lead Channel Setting Sensing Adaptation Mode Fixed Pacing    Lead Channel Setting Pacing Amplitude 2.5 V   Lead Channel Setting Pacing Pulse Width 0.5 ms   Lead Channel Setting Pacing Amplitude 1.25 V   Lead Channel Status NULL    Lead Channel Impedance Value 510 ohm   Lead Channel Sensing Intrinsic Amplitude 5.0 mV   Lead Channel Pacing Threshold Amplitude 1.25 V   Lead Channel Pacing Threshold Pulse Width 0.5 ms   Lead Channel Status NULL    Lead Channel Impedance Value 440 ohm    Lead Channel Sensing Intrinsic Amplitude 12.0 mV   Lead Channel Pacing Threshold Amplitude 1.0 V   Lead Channel Pacing Threshold Pulse Width 0.5 ms   Battery Status MOS    Battery Remaining Longevity 103 mo   Battery Remaining Percentage 83.0 %   Battery Voltage 3.01 V   Brady Statistic RA Percent Paced 1.9 %   Brady Statistic RV Percent Paced 99.0 %   Brady Statistic AP VP Percent 2.0 %   Brady Statistic AS VP Percent 98.0 %   Brady Statistic AP VS Percent 1.0 %   Brady Statistic AS VS Percent 1.0 %         has a past medical history of Burn, Coronary artery disease, Diverticul disease small and large intestine, no perforati or abscess, GERD (gastroesophageal reflux disease), Hernia, History of nuclear stress test, Hyperlipidemia, Low back pain, Oxygen toxicity, Plantar fasciitis, Retinal tear, Right rotator cuff tear, and Shingles.   reports that he quit smoking about 27 years ago. His smoking use included cigarettes. He started smoking about 47 years ago. He has a 40 pack-year smoking history. He has never used smokeless tobacco.  Past Surgical  History:  Procedure Laterality Date   EYE SURGERY     PACEMAKER IMPLANT N/A 01/04/2021   Procedure: PACEMAKER IMPLANT;  Surgeon: Regan Lemming, MD;  Location: [facility] INVASIVE CV LAB;  Service: Cardiovascular;  Laterality: N/A;   REFRACTIVE SURGERY     retina repair and retina tear   SHOULDER ARTHROSCOPY     SHOULDER SURGERY     TONSILLECTOMY      No Known Allergies  Immunization History  Administered Date(s) Administered   COVID-19, mRNA, vaccine(Comirnaty)12 years and older 01/04/2023   Fluad Quad(high Dose 65+) 06/07/2022   Influenza Split 09/22/2009, 06/01/2010, 06/07/2011, 05/27/2012, 07/28/2013   Influenza, High Dose Seasonal PF 06/15/2015, 06/05/2016, 06/08/2017, 06/07/2018   Influenza, Quadrivalent, Recombinant, Inj, Pf 06/07/2018, 06/12/2019, 06/22/2020, 06/01/2021   Influenza,inj,Quad PF,6+ Mos 06/18/2014   Moderna  Covid-19 Vaccine Bivalent Booster 30yrs & up 06/14/2021, 01/05/2022   PFIZER(Purple Top)SARS-COV-2 Vaccination 10/05/2019, 10/27/2019, 06/03/2020   Pneumococcal Conjugate-13 08/12/2013, 06/13/2014   Pneumococcal Polysaccharide-23 11/04/2000, 09/22/2009, 08/16/2010   Respiratory Syncytial Virus Vaccine,Recomb Aduvanted(Arexvy) 05/21/2022   Td 09/10/1998, 09/22/2009, 12/23/2019   Tdap 10/02/2010   Typhoid Inactivated 09/09/2015   Zoster Recombinant(Shingrix) 01/13/2017, 04/26/2017   Zoster, Live 09/16/2007, 09/22/2009    Family History  Problem Relation Age of Onset   Hyperlipidemia Mother    Diabetes Father    Heart disease Father    Coronary artery disease Brother      Current Outpatient Medications:    albuterol (VENTOLIN HFA) 108 (90 Base) MCG/ACT inhaler, Inhale 1-2 puffs into the lungs every 6 (six) hours as needed for wheezing or shortness of breath., Disp: 18 g, Rfl: 1   Alirocumab (PRALUENT) 150 MG/ML SOAJ, Inject 150 mg as directed every 30 (thirty) days. , Disp: , Rfl:    allopurinol (ZYLOPRIM) 100 MG tablet, Take 1 tablet by mouth 2 (two) times daily., Disp: , Rfl:    Alpha-Lipoic Acid 200 MG CAPS, Take 1 capsule by mouth in the morning and at bedtime., Disp: , Rfl:    aspirin 81 MG tablet, Take 81 mg by mouth daily., Disp: , Rfl:    atorvastatin (LIPITOR) 20 MG tablet, Take 10 mg by mouth at bedtime., Disp: , Rfl:    Cholecalciferol (VITAMIN D3) 1.25 MG (50000 UT) CAPS, Take once a week for 12 weeks and then once a month on the first of each month, Disp: 30 capsule, Rfl: 5   Coenzyme Q10 300 MG CAPS, Take 1 capsule by mouth every morning., Disp: , Rfl: 0   colchicine 0.6 MG tablet, Take 1 tablet by mouth 2 (two) times daily as needed (gout flare)., Disp: , Rfl:    dorzolamide-timolol (COSOPT) 22.3-6.8 MG/ML ophthalmic solution, Place 1 drop into the right eye 2 (two) times daily., Disp: , Rfl:    doxycycline (VIBRA-TABS) 100 MG tablet, Take 1 tablet (100 mg total) by mouth  2 (two) times daily. (Patient taking differently: Take 100 mg by mouth 2 (two) times daily. PRN), Disp: 10 tablet, Rfl: 0   fluorometholone (FML) 0.1 % ophthalmic suspension, Place 1 drop into the right eye daily., Disp: , Rfl:    Guaifenesin 1200 MG TB12, Take 1,200 mg by mouth 2 (two) times daily., Disp: , Rfl:    Loperamide-Simethicone 2-125 MG TABS, Take 1 tablet by mouth as needed. Up to 4 times a day., Disp: , Rfl:    Multiple Vitamins-Minerals (CENTRUM SILVER PO), Take 1 tablet by mouth every evening., Disp: , Rfl:    OXYGEN, Inhale 2 L into the  lungs., Disp: , Rfl:    Pirfenidone 801 MG TABS, TAKE 1 TABLET BY MOUTH 3 TIMES A DAY WITH MEALS, Disp: 90 tablet, Rfl: 2   Polyethyl Glycol-Propyl Glycol (SYSTANE) 0.4-0.3 % SOLN, Apply 1 drop to eye daily as needed (dry eyes)., Disp: , Rfl:    predniSONE (DELTASONE) 10 MG tablet, Take 4 tabs for 2 days, then 3 tabs for 2 days, 2 tabs for 2 days, then 1 tab for 2 days, then stop. (Patient taking differently: Take 10 mg by mouth as needed. Take 4 tabs for 2 days, then 3 tabs for 2 days, 2 tabs for 2 days, then 1 tab for 2 days, then stop. PRN), Disp: 20 tablet, Rfl: 0   SPIRIVA HANDIHALER 18 MCG inhalation capsule, INHALE 1 CAPSULE VIA HANDIHALER ONCE DAILY AT THE SAME TIME EVERY DAY, Disp: 30 capsule, Rfl: 6   triamcinolone cream (KENALOG) 0.1 %, Apply topically in the morning and at bedtime., Disp: , Rfl:    valACYclovir (VALTREX) 1000 MG tablet, Take 1 tablet by mouth daily as needed (fever blisters)., Disp: , Rfl:    valsartan (DIOVAN) 320 MG tablet, TAKE 1 TABLET (320 MG TOTAL) BY MOUTH EVERY EVENING., Disp: 90 tablet, Rfl: 3   vitamin C (ASCORBIC ACID) 500 MG tablet, Take 500 mg by mouth every evening., Disp: , Rfl:    XIFAXAN 550 MG TABS tablet, Take 550 mg by mouth 3 (three) times daily. PRN, Disp: , Rfl:       Objective:   Vitals:   04/22/23 0854  BP: 120/80  Pulse: (!) 58  SpO2: 96%  Weight: 164 lb (74.4 kg)  Height: 5\' 6"  (1.676 m)     Estimated body mass index is 26.47 kg/m as calculated from the following:   Height as of this encounter: 5\' 6"  (1.676 m).   Weight as of this encounter: 164 lb (74.4 kg).  @WEIGHTCHANGE @  [employer]   04/22/23 0854  Weight: 164 lb (74.4 kg)     Physical Exam   General: No distress. Looks well O2 at rest: no Cane present: no Sitting in wheel chair: no Frail: no Obese: no Neuro: Alert and Oriented x 3. GCS 15. Speech normal Psych: Pleasant Resp:  Barrel Chest - no.  Wheeze - no, Crackles - YES, No overt respiratory distress CVS: Normal heart sounds. Murmurs - no Ext: Stigmata of Connective Tissue Disease - no HEENT: Normal upper airway. PEERL +. No post nasal drip        Assessment:       ICD-10-CM   1. IPF (idiopathic pulmonary fibrosis) (HCC)  J84.112 Hepatic function panel    2. Medication monitoring encounter  Z51.81          Plan:     Patient Instructions     ICD-10-CM   1. IPF (idiopathic pulmonary fibrosis) (HCC)  J84.112     2. Medication monitoring encounter  Z51.81          #IPF IPF clinically stable on symptoms, home exercise test though DLCO might show some early decline On generic pirfenidone + BMS study drug ; tolerating both well without side effect  Plan   - continue pirfenidone as before - continue study drug - check LFT 04/22/2023 - safety monitoring for study drug via [facility] Chest  - continue daily exercise - keep pulse ox  >-= 88%  - once every few weeks - measure time or distance to desaturation on room air by walking at same pace and  incline at treadmill - will use spirometry and dlco from study to monitor     Follow-up - At 18-month standard of care visit with Dr. Marchelle Gearing 30 minutes-  -Symptom questionnaire and walk test at follow-up    FOLLOWUP Return in about 3 months (around 07/23/2023) for ILD, with Dr Marchelle Gearing, 30 min visit.  ( Level 05 visit E&M 2024: Estb >= 40 min n  visit type: on-site physical  face to visit  in total care time and counseling or/and coordination of care by this undersigned MD - Dr Kalman Shan. This includes one or more of the following on this same day 04/22/2023: pre-charting, chart review, note writing, documentation discussion of test results, diagnostic or treatment recommendations, prognosis, risks and benefits of management options, instructions, education, compliance or risk-factor reduction. It excludes time spent by the CMA or office staff in the care of the patient. Actual time 42 min)   SIGNATURE    Dr. Kalman Shan, M.D., F.C.C.P,  Pulmonary and Critical Care Medicine Staff Physician, [facility] Director - Interstitial Lung Disease  Program  Pulmonary Fibrosis [facility] [place], [place], [address]  Pager: [phone], If no answer or between  15:00h - 7:00h: call [phone] Telephone: [phone]  9:37 AM 04/22/2023

## 2023-04-26 ENCOUNTER — Other Ambulatory Visit: Payer: Self-pay | Admitting: Internal Medicine

## 2023-04-26 DIAGNOSIS — J84112 Idiopathic pulmonary fibrosis: Secondary | ICD-10-CM

## 2023-04-26 NOTE — Telephone Encounter (Signed)
Refill sent for PIRFENIDONE to [facility] (pulmonary fibrosis team): [phone]  Dose: 801 mg three times daily  Last OV: 04/22/23 Provider: Dr. Marchelle Gearing  Next OV: 07/23/23  LFTs on 04/22/23 wnl  Emi Belfast Renold Genta, PharmD, MPH, BCPS Clinical Pharmacist (Rheumatology and Pulmonology)

## 2023-04-27 DIAGNOSIS — R109 Unspecified abdominal pain: Secondary | ICD-10-CM | POA: Diagnosis not present

## 2023-04-27 DIAGNOSIS — M545 Low back pain, unspecified: Secondary | ICD-10-CM | POA: Diagnosis not present

## 2023-04-29 NOTE — Progress Notes (Signed)
Remote pacemaker transmission.   

## 2023-05-09 DIAGNOSIS — H40012 Open angle with borderline findings, low risk, left eye: Secondary | ICD-10-CM | POA: Diagnosis not present

## 2023-05-09 DIAGNOSIS — H40051 Ocular hypertension, right eye: Secondary | ICD-10-CM | POA: Diagnosis not present

## 2023-05-18 DIAGNOSIS — M545 Low back pain, unspecified: Secondary | ICD-10-CM | POA: Diagnosis not present

## 2023-05-18 DIAGNOSIS — R109 Unspecified abdominal pain: Secondary | ICD-10-CM | POA: Diagnosis not present

## 2023-05-30 ENCOUNTER — Other Ambulatory Visit (HOSPITAL_BASED_OUTPATIENT_CLINIC_OR_DEPARTMENT_OTHER): Payer: Self-pay

## 2023-05-30 DIAGNOSIS — Z23 Encounter for immunization: Secondary | ICD-10-CM | POA: Diagnosis not present

## 2023-05-30 MED ORDER — COVID-19 MRNA VAC-TRIS(PFIZER) 30 MCG/0.3ML IM SUSY
0.3000 mL | PREFILLED_SYRINGE | Freq: Once | INTRAMUSCULAR | 0 refills | Status: AC
  Filled 2023-05-30: qty 0.3, 1d supply, fill #0

## 2023-06-06 DIAGNOSIS — Z23 Encounter for immunization: Secondary | ICD-10-CM | POA: Diagnosis not present

## 2023-06-22 DIAGNOSIS — M545 Low back pain, unspecified: Secondary | ICD-10-CM | POA: Diagnosis not present

## 2023-06-22 DIAGNOSIS — R109 Unspecified abdominal pain: Secondary | ICD-10-CM | POA: Diagnosis not present

## 2023-06-24 ENCOUNTER — Ambulatory Visit (HOSPITAL_BASED_OUTPATIENT_CLINIC_OR_DEPARTMENT_OTHER): Payer: Medicare Other | Admitting: Cardiovascular Disease

## 2023-06-24 ENCOUNTER — Telehealth (HOSPITAL_COMMUNITY): Payer: Self-pay | Admitting: *Deleted

## 2023-06-24 ENCOUNTER — Encounter (HOSPITAL_BASED_OUTPATIENT_CLINIC_OR_DEPARTMENT_OTHER): Payer: Self-pay | Admitting: Cardiovascular Disease

## 2023-06-24 VITALS — BP 129/74 | HR 60 | Ht 67.0 in | Wt 166.4 lb

## 2023-06-24 DIAGNOSIS — R0609 Other forms of dyspnea: Secondary | ICD-10-CM | POA: Insufficient documentation

## 2023-06-24 HISTORY — DX: Other forms of dyspnea: R06.09

## 2023-06-24 NOTE — Progress Notes (Signed)
Cardiology Office Note:  .    Date:  06/24/2023  ID:  Reginald Davis, DOB May 20, 1946, MRN [id number] PCP: Reginald Ran, MD  [facility] Providers Cardiologist:  Reginald Si, MD Electrophysiologist:  Reginald Jorja Loa, MD     History of Present Illness: .    Reginald Davis is a 77 y.o. male with a hx of non-obstructive CAD, GERD, IPF, congenital blindness of the right eye, complete heart block s/p pacemaker, hypertension, and hyperlipidemia, here for follow-up. He was previously a patient of Dr. Delton Davis, who last saw him in 11/2020. He had an episode of syncope due to complete heart block 12/2020. Dr. Elberta Davis implanted a [employer] dual -chamber pacemaker 01/04/2021. He had a coronary CTA 08/2018 that revealed mild non-obstructive but diffuse CAD and a calcium score of 384. He has been intolerant of higher dose statins but has done well on atorvastatin and Praluent.    At his visit 06/2022, he was no longer on IPF treatment due to associated GI issues. However, his GI upset had not improved much since stopping the trial medication. He was using supplemental oxygen when exercising routinely and when sleeping at night. Home blood pressures were well controlled. He follows with Dr. Elberta Davis for his pacemaker which has been stable. He was seen by Dr. Elberta Davis 02/2023 and was doing well.  Today, he is accompanied by a family member. His main concern today is continued slight progression of his IPF. A little more breathlessness and occasional weakness. He continues to use 3 L of oxygen while exercising and he is trying to stay as active as possible with a goal of 10,000 steps per day. Formal exercise is primarily a pulmonary rehab regime at home, with 45 minutes of treadmill use with max elevation, 15-18 minutes stationary bike, resistance bands. He is also working with a physical therapist about once a month. Also once a month he tracks his desaturation during exercise without supplemental oxygen. He  has been using his oxygen a little more frequently especially if he is required to talk. In the office his blood pressure is 140/70 initially. He presents a BP log which averages 110s/60s at home. He notes that he has gained some weight during recent travels. Recently he had noticed some ankle swelling which he also attributed to his travels and difficulty to control his sodium intake. He usually prepares his own meals at home and plans to work on weight loss. He denies any palpitations, chest pain, lightheadedness, headaches, syncope, or PND.  ROS:  Please Davis the history of present illness. All other systems are reviewed and negative.  (+) Shortness of breath (+) Occasional weakness (+) Recent ankle swelling  Studies Reviewed: .        Risk Assessment/Calculations:             Physical Exam:    VS:  BP 129/74 Comment: home  Pulse 60   Ht 5\' 7"  (1.702 m)   Wt 166 lb 6.4 oz (75.5 kg)   SpO2 94%   BMI 26.06 kg/m  , BMI Body mass index is 26.06 kg/m. GENERAL:  Well appearing HEENT: Oral mucosa unremarkable.  Congenital blindness R eye NECK:  No jugular venous distention, waveform within normal limits, carotid upstroke brisk and symmetric, no bruits, no thyromegaly LUNGS:  Dry crackles at bilateral bases. HEART:  RRR.  PMI not displaced or sustained,S1 and S2 within normal limits, no S3, no S4, no clicks, no rubs, no murmurs ABD:  Flat, positive  bowel sounds normal in frequency in pitch, no bruits, no rebound, no guarding, no midline pulsatile mass, no hepatomegaly, no splenomegaly EXT:  2 plus pulses throughout, no edema, no cyanosis no clubbing SKIN:  No rashes no nodules NEURO:  Cranial nerves II through XII grossly intact, motor grossly intact throughout PSYCH:  Cognitively intact, oriented to person place and time  Wt Readings from Last 3 Encounters:  06/24/23 166 lb 6.4 oz (75.5 kg)  04/22/23 164 lb (74.4 kg)  03/06/23 162 lb 6.4 oz (73.7 kg)     ASSESSMENT AND PLAN: .     # Idiopathic Pulmonary Fibrosis (IPF) Noted slight progression with increased breathlessness and occasional weakness. Maintaining active lifestyle with 3L of oxygen during exercise. Participating in a research study with Reginald Davis. -Continue current management and participation in research study.  # Coronary Artery Disease Known plaque in coronary arteries.  CT shows that shows ostial disease.  No current chet pain, but due to limitations in exercise capacity from IPF, there is a concern that significant blockage could be missed.  -Schedule nuclear stress test to assess for significant blockage. - Continue aspirin, atorvastatin and Praluent  # Hypertension Well controlled with home blood pressure readings averaging in the 110s/60s. -Continue current management on valsartan.  # Hyperlipidemia LDL at 42, well controlled. -Continue current management.  Follow-up in 1 year, unless stress test results indicate a need for sooner follow-up.       Informed Consent   Shared Decision Making/Informed Consent The risks [chest pain, shortness of breath, cardiac arrhythmias, dizziness, blood pressure fluctuations, myocardial infarction, stroke/transient ischemic attack, nausea, vomiting, allergic reaction, radiation exposure, metallic taste sensation and life-threatening complications (estimated to be 1 in 10,000)], benefits (risk stratification, diagnosing coronary artery disease, treatment guidance) and alternatives of a nuclear stress test were discussed in detail with Reginald Davis and he agrees to proceed.     Dispo:  FU with Reginald Manner C. Duke Salvia, MD, [facility] in 1 year.  I,Reginald Davis,acting as a Neurosurgeon for Reginald Si, MD.,have documented all relevant documentation on the behalf of Reginald Si, MD,as directed by  Reginald Si, MD while in the presence of Reginald Si, MD.  I, Reginald Gavin C. Duke Salvia, MD have reviewed all documentation for this visit.  The documentation of the  exam, diagnosis, procedures, and orders on 06/24/2023 are all accurate and complete.   Signed, Reginald Si, MD

## 2023-06-24 NOTE — Telephone Encounter (Signed)
Patient given detailed instructions per Myocardial Perfusion Study Information Sheet for the test on 06/26/23 Patient notified to arrive 15 minutes early and that it is imperative to arrive on time for appointment to keep from having the test rescheduled.  If you need to cancel or reschedule your appointment, please call the office within 24 hours of your appointment. . Patient verbalized understanding. Reginald Davis

## 2023-06-24 NOTE — Patient Instructions (Addendum)
Medication Instructions:  Your physician recommends that you continue on your current medications as directed. Please refer to the Current Medication list given to you today.   *If you need a refill on your cardiac medications before your next appointment, please call your pharmacy*  Lab Work: NONE  Testing/Procedures: Your physician has requested that you have a lexiscan myoview. For further information please visit [email or web address] Please follow instruction sheet, as given.  Follow-Up: At [facility], you and your health needs are our priority.  As part of our continuing mission to provide you with exceptional heart care, we have created designated Provider Care Teams.  These Care Teams include your primary Cardiologist (physician) and Advanced Practice Providers (APPs -  Physician Assistants and Nurse Practitioners) who all work together to provide you with the care you need, when you need it.  We recommend signing up for the patient portal called "MyChart".  Sign up information is provided on this After Visit Summary.  MyChart is used to connect with patients for Virtual Visits (Telemedicine).  Patients are able to view lab/test results, encounter notes, upcoming appointments, etc.  Non-urgent messages can be sent to your provider as well.   To learn more about what you can do with MyChart, go to [email or web address].    Your next appointment:   12 month(s)  Provider:   Chilton Si, MD    Other Instructions  Your Physician has requested you have a Myocardial Perfusion Imaging Study.    Please arrive 15 minutes prior to your appointment time for registration and insurance purposes.   The test will take approximately 3 to 4 hours to complete; you may bring reading material. If someone comes with you to your appointment, they will need to remain in the main lobby due to limited testing space in the testing area. **If you are pregnant or breastfeeding, please  notify the nuclear lab prior to your appointment**   How to prepare for your test:  - Do not eat or drink 3 hours prior to your test, except you may have water  - Do not consume products containing caffeine ( regular or decaf) 12 hours prior to your test ( coffee, chocolate, sodas or teas)  - Do bring a list of your current medications with you. If not listed below, you may take your medications as normal (Hold beta blocker- 24 hour prior for exercise myoview)   - Do wear comfortable clothes (no dresses or overalls) and walking shoes ( tennis shoes preferred), no heel or open toe shoes are allowed - Do not wear cologne, perfume, aftershave, or lotions ( deodorant is allowed)  - If these instructions are not followed, your test will be rescheduled   If you cannot keep your appointment, please provide 24 hours notice to the Nuclear Lab, to avoid a possible $50 charge to your account!

## 2023-06-26 ENCOUNTER — Ambulatory Visit (HOSPITAL_COMMUNITY): Payer: Medicare Other | Attending: Cardiovascular Disease

## 2023-06-26 DIAGNOSIS — R0609 Other forms of dyspnea: Secondary | ICD-10-CM | POA: Insufficient documentation

## 2023-06-26 LAB — MYOCARDIAL PERFUSION IMAGING
LV dias vol: 93 mL (ref 62–150)
LV sys vol: 38 mL
Nuc Stress EF: 59 %
Peak HR: 77 {beats}/min
Rest HR: 58 {beats}/min
Rest Nuclear Isotope Dose: 10.2 mCi
SDS: 0
SRS: 0
SSS: 0
ST Depression (mm): 0 mm
Stress Nuclear Isotope Dose: 31.9 mCi
TID: 1.12

## 2023-06-26 MED ORDER — TECHNETIUM TC 99M TETROFOSMIN IV KIT
10.2000 | PACK | Freq: Once | INTRAVENOUS | Status: AC | PRN
  Administered 2023-06-26: 10.2 via INTRAVENOUS

## 2023-06-26 MED ORDER — TECHNETIUM TC 99M TETROFOSMIN IV KIT
31.9000 | PACK | Freq: Once | INTRAVENOUS | Status: AC | PRN
  Administered 2023-06-26: 31.9 via INTRAVENOUS

## 2023-06-26 MED ORDER — REGADENOSON 0.4 MG/5ML IV SOLN
0.4000 mg | Freq: Once | INTRAVENOUS | Status: AC
  Administered 2023-06-26: 0.4 mg via INTRAVENOUS

## 2023-07-02 ENCOUNTER — Other Ambulatory Visit: Payer: Self-pay | Admitting: Internal Medicine

## 2023-07-02 DIAGNOSIS — R0609 Other forms of dyspnea: Secondary | ICD-10-CM

## 2023-07-02 DIAGNOSIS — I2510 Atherosclerotic heart disease of native coronary artery without angina pectoris: Secondary | ICD-10-CM

## 2023-07-04 ENCOUNTER — Encounter: Payer: Self-pay | Admitting: Internal Medicine

## 2023-07-08 ENCOUNTER — Ambulatory Visit (HOSPITAL_BASED_OUTPATIENT_CLINIC_OR_DEPARTMENT_OTHER): Payer: Medicare Other | Admitting: Cardiovascular Disease

## 2023-07-15 ENCOUNTER — Ambulatory Visit (INDEPENDENT_AMBULATORY_CARE_PROVIDER_SITE_OTHER): Payer: Medicare Other

## 2023-07-15 DIAGNOSIS — I442 Atrioventricular block, complete: Secondary | ICD-10-CM | POA: Diagnosis not present

## 2023-07-15 LAB — CUP PACEART REMOTE DEVICE CHECK
Battery Remaining Longevity: 100 mo
Battery Remaining Percentage: 80 %
Battery Voltage: 3.01 V
Brady Statistic AP VP Percent: 1.9 %
Brady Statistic AP VS Percent: 1 %
Brady Statistic AS VP Percent: 90 %
Brady Statistic AS VS Percent: 7.8 %
Brady Statistic RA Percent Paced: 2 %
Brady Statistic RV Percent Paced: 92 %
Date Time Interrogation Session: 20241104010015
Implantable Lead Connection Status: 753985
Implantable Lead Connection Status: 753985
Implantable Lead Implant Date: 20220427
Implantable Lead Implant Date: 20220427
Implantable Lead Location: 753859
Implantable Lead Location: 753860
Implantable Pulse Generator Implant Date: 20220427
Lead Channel Impedance Value: 440 Ohm
Lead Channel Impedance Value: 480 Ohm
Lead Channel Pacing Threshold Amplitude: 1 V
Lead Channel Pacing Threshold Amplitude: 1.25 V
Lead Channel Pacing Threshold Pulse Width: 0.5 ms
Lead Channel Pacing Threshold Pulse Width: 0.5 ms
Lead Channel Sensing Intrinsic Amplitude: 5 mV
Lead Channel Sensing Intrinsic Amplitude: 7 mV
Lead Channel Setting Pacing Amplitude: 1.25 V
Lead Channel Setting Pacing Amplitude: 2.5 V
Lead Channel Setting Pacing Pulse Width: 0.5 ms
Lead Channel Setting Sensing Sensitivity: 2 mV
Pulse Gen Model: 2272
Pulse Gen Serial Number: 3920271

## 2023-07-19 DIAGNOSIS — Z1389 Encounter for screening for other disorder: Secondary | ICD-10-CM | POA: Diagnosis not present

## 2023-07-19 DIAGNOSIS — R7301 Impaired fasting glucose: Secondary | ICD-10-CM | POA: Diagnosis not present

## 2023-07-19 DIAGNOSIS — Z0000 Encounter for general adult medical examination without abnormal findings: Secondary | ICD-10-CM | POA: Diagnosis not present

## 2023-07-19 DIAGNOSIS — J8410 Pulmonary fibrosis, unspecified: Secondary | ICD-10-CM | POA: Diagnosis not present

## 2023-07-20 DIAGNOSIS — R109 Unspecified abdominal pain: Secondary | ICD-10-CM | POA: Diagnosis not present

## 2023-07-20 DIAGNOSIS — M545 Low back pain, unspecified: Secondary | ICD-10-CM | POA: Diagnosis not present

## 2023-07-22 NOTE — Progress Notes (Unsigned)
OV 08/09/2020  Subjective:  Patient ID: Reginald Davis, male , DOB: December 17, 1945 , age 77 y.o. , MRN: [id number] , ADDRESS: [address] [place] [address] PCP Rodrigo Ran, MD Patient Care Team: Rodrigo Ran, MD as PCP - General (Internal Medicine) End, Cristal Deer, MD as PCP - Cardiology (Cardiology) Cardiologist is Dr. Andreas Ohm This Provider for this visit: Treatment Team:  Attending Provider: Kalman Shan, MD    08/09/2020 -   Chief Complaint  Patient presents with   Consult    COPD, DOE     HPI Reginald Davis 77 y.o. 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  History is given by the patient who also brought in very clearly typed notes about his history.  He says in the 1970s and 1980s while he was busy with his professional life he was a smoker and finally quit smoking in 1997.  His total pack smoking history might be 70 years.  He undergoes regular screening CT scans of the chest.  Most recently March 2021 that reported signs of emphysema and a small upper lobe nodule unchanged over many years and possible interstitial lung disease changes.  He says he was at baseline and doing well but in April 2021 he started noticing shortness of breath.  He was placed on Spiriva which improved his symptoms daily.  After that he felt like significantly improved.  He then was dealing with significant difficult to control blood pressures with Dr. Delton See.  She has increased his dosage of the blood pressure medication.  He was remaining active walking approximately thousand steps daily and 30 minutes of walking 5 more days per week.  Then in September 2020 when he and his wife traveled to [place] and then drove to [place] which is an elevation of 4000-4500 feet.  After that they continue to [facility] at 6500-7500 feet.  They got very short of breath.  Even had a fever episode.  His pulse oximetry dropped to 84-85.  He ended up in the emergency room  and was given a diagnosis of COPD exacerbation.  Given doxycycline and prednisone and then he improved.  Post improvement in September and post return to [facility] he has had difficult to control blood pressure but he does have some residual shortness of breath with exertion relieved by rest.  His apple watch has noticed desaturations at night that are transient with pulse ox ranging 85 to 96%.  His resting heart rate at bed is between 54 and 90 at night.  His current walking desaturation test is below.  He dropped only 2 points but he did get tachycardic.  His pulmonary function test show slight reduction in DLCO but otherwise okay  He follows with Dr. Delton See who noticed he has left lower lobe crackles.  Due to persistent symptoms he has been referred to pulmonary clinic.  He wants to get to the bottom of the issues.  His echocardiogram shows grade 1 diastolic dysfunction.  He does suffer from some visceral obesity.   Of note he has upcoming air travel to [place] on August 15, 2020.  He is going to see his granddaughter who is not seen since the onset of the pandemic.  He believes he may not be traveling to high altitudes in [place].    ADDENDUM: There is a right lower lobe nodule that is seen between vessels in the right lower lobe on image 97 of series 8.  This nodule was compared to studies dating back to 2015 and shows no change. It is possible that this represents a lymph node along bronchovascular structures but again this is stable over a 6 year interval, obscured by surrounding vessels and bronchovascular structures.     Electronically Signed   By: Donzetta Kohut M.D.   On: 11/27/2019 14:45    Signed by Jule Economy, MD on 11/27/2019  2:48 PM  Narrative & Impression  CLINICAL DATA:  Left upper lobe nodules, history of bronchiectasis and emphysema.   EXAM: CT CHEST WITHOUT CONTRAST   TECHNIQUE: Multidetector CT imaging of the chest was performed following  the standard protocol without IV contrast.   COMPARISON:  Coronary CT from 08/26/2018, CT chest from 09/20/2017   FINDINGS: Cardiovascular: Calcified atherosclerotic changes in the thoracic aorta without signs of aneurysm. Limited assessment on noncontrast evaluation. The heart size is normal without pericardial effusion. Coronary artery disease with three-vessel calcification as before.   Mediastinum/Nodes: Thoracic inlet structures are normal. No axillary lymphadenopathy. Small right juxta hilar lymph node, 9 mm unchanged since January of 2019 along with other scattered lymph nodes below CT criteria for pathologic enlargement. Limited assessment of hilar structures due to lack of contrast. Esophagus is mildly patulous with some gas in the lumen.   Lungs/Pleura: Small nodule between bronchovascular structures in the central portion of the right lower lobe (image 97, series 8) 11 x 7 mm, within 1 mm of its size in 2019. No dense consolidation. Mild basilar bronchiectasis. Subpleural reticulation as before with signs of centrilobular emphysema.   Tiny nodules in the left upper lobe are stable.   Upper Abdomen: Incidentally imaged portions of upper abdominal contents are unremarkable. No evidence of acute upper abdominal process.   Musculoskeletal: No chest wall mass. Sclerosis of the right clavicular head is unchanged since 2019. Spinal degenerative changes as before.   IMPRESSION: 1. Small nodule between bronchovascular structures in the central portion of the right lower lobe measuring 11 x 7 mm, within 1 mm of its size in 2019. This nodule is within 1 mm of its size in 2019. This nodule is unchanged since 2015 and this therefore benign. 2. Signs of emphysema and chronic interstitial changes with tiny upper lobe nodules are unchanged. 3. Coronary artery disease with three-vessel calcification.   Aortic Atherosclerosis (ICD10-I70.0) and Emphysema (ICD10-J43.9).    Electronically Signed: By: Donzetta Kohut M.D. On: 11/26/2019 09:11     08/29/2020- Interim hx Patient presents today to review testing and discuss starting anti-fiibrotic medication. CT imaging suggestive of probable UIP with progression since 2015, high suspicion towards IPF. He had negative serology. Dr. Marchelle Gearing recommend starting anti-fibrotic, either Esbriet or OFEV. Accompanied by his wife during today's appointment. He is baseline today, shortness of breath and cough are the same as last visit. He was recently in [place] for 10 days, he did not need to take on hand doxycyline or prednisone. He had an overnight oximetry test on12/2/21 which showed <88% 5 hrs 35 mins, start 2L [place]. He has not received oxygen yet, he was contacted by DME company. He is interested in [employer] continuous oxygen concentrator and is willing to pay out of pocket in needed. He reports daytime oxygen saturation runs between 87-92% RA. He is active, gets an average of 8,000 steps a day. He will be changing medicare part D plans to [employer] silver come January 1st 2022.     OV 09/29/2020  Subjective:  Patient ID: Enzo Montgomery  Boykin Reaper, male , DOB: July 10, 1946 , age 25 y.o. , MRN: [id number] , ADDRESS: [address] [place] [address] PCP Rodrigo Ran, MD Patient Care Team: Rodrigo Ran, MD as PCP - General (Internal Medicine) End, Cristal Deer, MD as PCP - Cardiology (Cardiology)  This Provider for this visit: Treatment Team:  Attending Provider: Kalman Shan, MD    09/29/2020 -   Chief Complaint  Patient presents with   Follow-up    6 min walk performed 09/22/20.  Pt states he feels like he has been stable since last visit. Pt is wearing 2L at night.  Pt states if he is on an incline, his sats are dropping some below 88% on room air.    IPF dx givien 08/26/20 based on "probable UIP on CT + negative srology, +  age > 32, _ white + male + prior smoker + heart burn/GERD hx-> this is "IPF"  Associated mild  emphysema present -started Spiriva  GERD hx  Pirfenidone start this pending January 2022/February 2022  HPI Reginald Davis 77 y.o. -returns for follow-up.  He presents with his wife.  Since his last visit in the interim he saw nurse practitioner.  We gave him a diagnosis of IPF based on probable UIP on CT scan age greater than 35, Caucasian ethnicity, male gender, prior smoker, acid reflux history and negative serology.  He has accepted the diagnosis.  He presents with his wife at this point in time.  They have gone through pirfenidone education and started paperwork.  He is waiting for co-pay approval.  At this point in time he feels stable his symptom score is below.  He is completed a 6-minute walk test but he did not desaturate below 88%.  The focus of this visit is multiple questions in general education and answering questions about idiopathic pulmonary fibrosis  -Overall his cough is improved.  He attributes his cough improvement to omeprazole.  However he wants to switch to pantoprazole.  He did some reading and he said that he is concerned about theoretical effects of omeprazole and pirfenidone plasma levels therefore he wants to switch to pantoprazole.  I have supported this and will do the prescription.  -He is very interested in clinical trials as a care option.  We discussed concept of clinical trials being voluntary and what care option meant.  He is probably leaning towards later phase trials.  We gave him consent form for PROMEDIOR Phase 3 and Pliant phase 2 studies but did indicate to him that there is currently a backlog and in addition he will have to be stable on antifibrotic's for a few months before he can enroll.  He is understanding of this.  -He is interested in the patient support group and I have given him the email for a local support group to the pulmonary fibrosis foundation  -He exercises regularly.  However he is interested in joining pulmonary rehabilitation a  referral has been made but he is still waiting to hear from them.  I have emailed the director to get a follow-up.  -We discussed the natural history of pulmonary fibrosis.  Especially IPF.  We discussed its progressive disease.  He and his wife wanted to know about prognostic markers.  Currently these are not commercially available.  In particular is interested in Kale-6.  Indicated that in the next few years, she will test might be available to differentiate prognosis but at this time we would follow clinically.  Currently based on current severity and  stability predicts future progression.  Also explained viral illnesses can exacerbate diseases  - Lung transplantation: He is interested in this concept.  He wants early referral to [facility] so he gets his options covered.  He knows he has to lose weight.  We discussed common barriers for lung transplantation including psychological Support, social support, financial costs.  He says he does not have any of this.  His BMI is 29 he needs to get to less than 27.  Explained to him nutrition is the way to go on this.  He is also going to do rehabilitation   - We also discussed his emphysema component being small.  He will continue with Spiriva  = Oxygenation -he is using nocturnal oxygen.  His apple watch shows his oxygen levels to be normal now at night.  He is monitoring this.  He says that when he exerts heavily his oxygen does drop but on the 6-minute walk test he did not drop.  He will pay for a portable oxygen system on his own out-of-pocket.  Therefore we will do an order for this.      CT Chest data  No results found.    OV 11/10/2020  Subjective:  Patient ID: Reginald Davis, male , DOB: 1945/11/27 , age 28 y.o. , MRN: [id number] , ADDRESS: [address] [place] [address] PCP Rodrigo Ran, MD Patient Care Team: Rodrigo Ran, MD as PCP - General (Internal Medicine) End, Cristal Deer, MD as PCP - Cardiology (Cardiology)  This  Provider for this visit: Treatment Team:  Attending Provider: Kalman Shan, MD    11/10/2020 -   Chief Complaint  Patient presents with   Follow-up    Doing well    IPF dx givien 08/26/20 based on "probable UIP on CT + negative srology, +  age > 86, _ white + male + prior smoker + heart burn/GERD hx-> this is "IPF"  -Last CT scan of the chest December 2021  - Esbriet start date -> Oct 26, 2020   - Pulm rehab Mar 2022   Associated mild emphysema present -started Spiriva    HPI Reginald Davis 77 y.o. -presents with his wife. This is for IPF follow-up. He finally got hold of his pirfenidone. He started this 10/26/2020. So far he is tolerating it well. Earlier this week on 11/08/2020 he started pulmonary rehabilitation. He feels stable. Wife feels he is an under perceiver. On a 6-minute walk test he dropped to as low as 89%. He had a pulmonary function test at [facility] that showed decline in DLCO. He is worried about this. We walked him again today and his pulse ox did drop to 88%. That could have been a variation in place but clearly this a difference suggesting progression. However he is tolerating his pirfenidone fine. No side effects as evidenced below in the symptom score. He really wants to start ASAP on clinical trials. He is already met with [facility] for transplant as an option. He had to consent forms one for phase 3 IV infusion PRomedior and another for Phase 2 Pliant Part C. The latter has moved to Part D which is higher dose. He had more questions about this. We went over several components of clinical trials documented below. Explained to him about therapeutic misconception and secondary intent being therapeutic gain. Advised him that the primary intent should always be about drug development and contribution to science. He understood this. Explained to him currently there is supply  chain issues with a phase 3 study. However the phase 2 sutdy is active. However he were to  wait a few months before he is eligible for either study because he just started on pirfenidone. He understands this. We have given him an updated consent form. [facility]FT [facility] feb [facility]22 FVC 3.55L DLCO 11.02    Telephone call 11/30/20   Nov 2021 -> March 2022 with [place]  - FVC up - dlco down  Compared to [facility] feb 2022  -March 2022 with  [place]  - FVC down  - DLCO up   Overall  - would stay stable  - there is mixed signal - nothing to do for now  - allow anti fibrotic kick in  - be patient   OV 12/21/2020  Subjective:  Patient ID: Reginald Davis, male , DOB: 03/16/1946 , age 35 y.o. , MRN: [id number] , ADDRESS: [address] [place] [address] PCP Rodrigo Ran, MD Patient Care Team: Rodrigo Ran, MD as PCP - General (Internal Medicine) End, Cristal Deer, MD as PCP - Cardiology (Cardiology)  This Provider for this visit: Treatment Team:  Attending Provider: Kalman Shan, MD    12/21/2020 -   Chief Complaint  Patient presents with   Follow-up    PFT performed 11/25/20.  Pt states he has been doing okay and denies any complaints.     IPF dx givien 08/26/20 based on "probable UIP on CT + negative srology, +  age > 72, + white + male + prior smoker + heart burn/GERD hx-> this is "IPF"  -Last CT scan of the chest December 2021  - Esbriet start date -> Oct 26, 2020   - Pulm rehab Mar 2022  - Clinical trial education - Mar 2022 [facility] D ICF given)  - PFF concept introduction - May 2022   Associated mild emphysema present -started Spiriva  Normal Echo May 2021  HPI Reginald Davis 78 y.o. -returns for follow-up with his wife.  He is now on pirfenidone since mid 01/27/2021 and is tolerating it well.  He reports intentional weight loss.  He has some mild GI side effects that are reflected in the symptom score and this is stable.  He says with a combination of intentional weight loss and rehabilitation and oxygen use [with exertion] his resting heart rate is now in the 60s.  He  showed me his apple watch curve about that.  He is pretty excited about that.  He has had [place] for short of Covid vaccine mRNA.  He is really interested in clinical trials.  He has met with the research coordinator and is scheduled himself for Pliant study visit mid May 2022.  He has read the consent.  He is keeping up with the literature on pulmonary fibrosis.  He is pretty excited about an upcoming IND application approval for the pharmaceutical company [employer] for [employer] BUI [id number].  We will be a study site for this but probably in the fall or the winter 2022.  I told him I do not know much details about this drug as yet.  He says he will start the Pliant study currently.  He wants to know about travel.  He has some prednisone and hand and also antibiotics.  We will go to [place] and [place].  He will take his portable oxygen system with him.  I have approved this travel.  At room air at rest he is fine.  When he exerts himself with treadmill and  exercise he uses 2-3 L pulse.  Most recent liver function test was normal.  This was in December 12, 2020.  He is worried about his progression.  Particular [facility]'s test showed a drop.  He then had pulmonary function test with [place].  I put these results together.  It appears that there is lot of fluctuation particularly with the [facility] test.  But on average it appears his DLCO is declined since he has been with [place] but his FVC is stable.   He is enjoying his rehabilitation.  PFT   Subjective:  Patient ID: Reginald Davis, male , DOB: 09-Jul-1946 , age 2 y.o. , MRN: [id number] , ADDRESS: [address] [place] [address] PCP Rodrigo Ran, MD Patient Care Team: Rodrigo Ran, MD as PCP - General (Internal Medicine) Regan Lemming, MD as PCP - Electrophysiology (Cardiology) Chilton Si, MD as PCP - Cardiology (Cardiology)  This Provider for this visit: Treatment Team:  Attending Provider: Kalman Shan,  MD    08/22/2021 -   Chief Complaint  Patient presents with   Follow-up    Pt states he has had diarrhea for the past 6 weeks and is unsure if it is due to his medication.     IPF dx givien 08/26/20 based on "probable UIP on CT + negative srology, +  age > 35, + white + male + prior smoker + heart burn/GERD hx-> this is "IPF"  -Last CT scan of the chest December 2021  - Esbriet start date -> Oct 26, 2020   - Pulm rehab Mar 2022  - Clinical trial education - Mar 2022 [facility] ICF given)     - Pliant study participant Tyson Alias 02Jun2022 and was randomized 22Jun2022  - PFF concept introduction - May 2022   Associated mild emphysema present -started Spiriva  Normal Echo May 2021  HPI Reginald Davis 77 y.o. -   IPF: [facility] visit for OPF. On esbrit and also study (IMP v placebo). Has study  visit#10 in 19Dec, then come back for EOT on 30Jan and EOS on 14Feb and then he is done.  From a respiratory standpoint his symptom scores are slowly actually improved.  His FVC also shows slow improvement over time.  In August 2022 he had a walk test at [facility] and this shows improvement in walking distance.  His weight is stable.  He had recent high-resolution CT chest.  Compared to September 2022 his ILD is stable but compared to remote past there is progression.  Overall he is pleased with the symptoms and stability.  He uses oxygen 2 L at night and 3 L pulsed with exercise and while traveling.  He continues on pirfenidone and study drug.  He should finish the study next month or so.  Rash: Sometime around September 2022 or so he developed punctate rash in his forearms and his back.  He says this still persist but it is stable its not any worse.  He is not overly concerned about this  Main new issue is that since the last 6 weeks he has had diarrhea.  Started as mild but since then is gotten progressive.  He says currently it is severe.  He is having a lot of flatulence and when he has  significant amount of flatulence he is having some amount of stool in his underwear.  For the last 3 weeks he is taking align probiotic.  For the last 1 week he is also  taking a protein shake but he says there is no sweetener in it.  For the last 1 week he is started Imodium.  After this diarrhea is better.  He still having diarrhea 3-4 times a day.  He is taking Imodium 3-4 times a day.  The stool is definitely more formed but it still worrisome for him.  He says he is cut down on nuts and fruits.  He denies any artificial sweetener.  Did medication review for diarrhea causing drugs: The only new introduction is a study drug and prior to that was the pirfenidone.  However he is on other drugs chronically that can cause diarrhea and this includes colchicine greater than 20%, Diovan greater than 5% and PPI.  Initially did not complain about pain but when I asked him he said sometimes he has some right lower quadrant discomfort.  There is no fever or bloody stools.  He had recent safety labs on 08/15/2021.  I personally reviewed this and this is normal.     Upcoming travel: He is plan to go to [place] in January 2023.  This is for 1-2 weeks and is to visit his daughter and his only grandchild.  He wants his diarrhea significantly addressed before the trip.   OV 11/28/2021  Subjective:  Patient ID: Reginald Davis, male , DOB: [birth year] , age 82 y.o. , MRN: [id number] , ADDRESS: [address] [place] [address] PCP Rodrigo Ran, MD Patient Care Team: Rodrigo Ran, MD as PCP - General (Internal Medicine) Regan Lemming, MD as PCP - Electrophysiology (Cardiology) Chilton Si, MD as PCP - Cardiology (Cardiology)  This Provider for this visit: Treatment Team:  Attending Provider: Kalman Shan, MD    11/28/2021 -   Chief Complaint  Patient presents with   Follow-up    Pt states he has been doing okay since last visit.   HPI Reginald Davis 77 y.o. -returns for follow-up.   Presents with his wife.  He tells me that he is doing well overall.  After coming off the pliant study in February 2023 for 2 weeks he thought he had increased nocturnal desaturations but then this settled out and he is better.  He is intentionally lost weight.  He did go for a lung transplant evaluation at [facility] 11/01/2021.  His 6-minute walking test is improved.  His pulmonary function test also has improved [see below].  He attributes this improvement to being on the research study protocol.  We do not know if he was randomized to placebo or the actual medicine of product.  He believes he was randomized to the actual medicine product.  He did have some increased diarrhea during the study and he says this is now improved.  He is interested in more studies.  Although the current improvement in diarrhea is also because he is taking Imodium on a scheduled basis.  He continues to use 2 L at night and 3 L with exercise although 6-minute walk test had an improved distance and did not desaturate below 91% and he finished 15 laps at [facility].  He is interested in more clinical trials.  We we discussed a phase 2 oral anticollagen inhibitor study by [employer] sponsor.  He has taken the consent to review.  He is also interested in the future injection study that he came across in the Internet.  It is sponsored by [employer].  Were not a location for the study at this point in time but did indicate  to him that we are in communication and do not have much information to share with him about that.  He is going to read the Internet and let us know what study he wants to do.  He continues to have dry skin.  We talked about flaxseed  He has tended travel to [place] in July 2023.  He wants [place] Paxlovid refilled.  He also wants doxycycline and prednisone refill for the trip.  I agreed to do that.    Subjective:  Patient ID: Reginald Davis, male , DOB: 1946/03/12 , age 2 y.o. , MRN: [id number] , ADDRESS: [address] [place] [address] PCP Rodrigo Ran, MD Patient Care Team: Rodrigo Ran, MD as PCP - General (Internal Medicine) Regan Lemming, MD as PCP - Electrophysiology (Cardiology) Chilton Si, MD as PCP - Cardiology (Cardiology)  This Provider for this visit: Treatment Team:  Attending Provider: Kalman Shan, MD    03/02/2022 -   Chief Complaint  Patient presents with   Follow-up    Pt here for f/u on ILD, states breathing has been good some SOB increased coughing.      HPI Reginald Davis 77 y.o. -returns for follow-up.  He has completed his pliant research protocol in February 2023.  He continues his as..  He uses 2 L nasal cannula at night.  He uses oxygen with exertion.  Symptom score shows stable.  Pulmonary function test is stable.  However he feels that at night his Apple Watch on 2 L nasal cannula showing more desaturations than usual.  We did discuss about getting this formally tested with a overnight pulse oximetry to see if he would need more oxygen.  He is agreeable to this plan.  I did indicate to him that given stability in symptoms and pulmonary function test and the walking desaturation test  -pretest probably due for him desaturating at night more than usual would be low.  Is also willing to get echocardiogram and a BNP check.  Depending on this we might have to consider right heart catheterization if for WHO group 3 pulmonary hypertension is in the differential diagnosis.  But overall I think the probably of pulmonary hypertension is also low given the stability.   He continues to be interested in research protocols.  He is particularly interested in a study called  MOONSCAPE -which involves the STAT3 pathway against IPF and it would involve a subcutaneous injection.  He is reading a consent form.  He is willing to participate if he passes on prescreen.    He also believes that his acid reflux is slightly more active.  He is taking his PPI.  He is doing all  the other measures.  But did notice that he is taking fish oil.  He takes this for his hyperlipidemia.  I did indicate to him side effect of fish oil as acid reflux.  He is now on Praluent for his hyperlipidemia which is working really well.  Therefore he is willing to stop the fish oil.   In terms of dry skin: He takes some flaxseed he is putting a new lotion and this is better.  In terms of travel he is going to go to [place] again to see his grandchild.  This will be in July 2023.  In terms of his complete heart block: He is on pacer but he believes the pacer is more active now.  OV 06/07/2022  Subjective:  Patient ID: Reginald Davis,  male , DOB: 28-Sep-1945 , age 15 y.o. , MRN: [id number] , ADDRESS: [address] [place] [address] PCP Rodrigo Ran, MD Patient Care Team: Rodrigo Ran, MD as PCP - General (Internal Medicine) Regan Lemming, MD as PCP - Electrophysiology (Cardiology) Chilton Si, MD as PCP - Cardiology (Cardiology)  This Provider for this visit: Treatment Team:  Attending Provider: Kalman Shan, MD    06/07/2022 -   Chief Complaint  Patient presents with   Follow-up    PFT was cancelled by pt.  Pt states he has had a few episodes where he has noticed lower O2 sats than before which was seen on walk test done by [facility]. Pt also states he has had a little more coughing than usual.     HPI Reginald Davis 77 y.o. -returns for follow-up this is standard of care visit.  I saw him 2 days ago as a research visit.  He enrolled into a new study.  The study involved a subcutaneous injection he signed consent.  He had screening pulmonary function test documented below.  For the first time his FEV1 FVC ratio is below 70.  He did stop Spiriva before the breathing test based on protocol requirements.  In review of his pulmonary function test he had end of August another pulmonary function test at [facility] and even that the ratio has gone down.  Prior to that his  FEV1 FVC ratio is always over 70.  The first time this is gone down.  I did indicate to him I do not understand why but I have a few patients this is happened.  He feels stable.  His 6-minute walk test at [facility] was stable.  His symptom scores are stable.  Uses oxygen with exercise.  His wife wanted me to caution him not to over exercise and desaturate too much.  I did tell him to keep his pulse ox over 88%.  He is very interested in clinical trials but he understands that if his FEV1 FVC ratio is less than 70 he will disqualify for many trials.  He says he has come to terms with this.  He is on Esbriet and is tolerating this well without any problems.  He did have [facility] lung transplant visit I reviewed those notes.  They feel he is too early for transplant.    OV 08/27/2022  Subjective:  Patient ID: Reginald Davis, male , DOB: 1945/11/23 , age 31 y.o. , MRN: [id number] , ADDRESS: [address] [place] [address] PCP Rodrigo Ran, MD Patient Care Team: Rodrigo Ran, MD as PCP - General (Internal Medicine) Regan Lemming, MD as PCP - Electrophysiology (Cardiology) Chilton Si, MD as PCP - Cardiology (Cardiology)  This Provider for this visit: Treatment Team:  Attending Provider: Kalman Shan, MD    08/27/2022 -   Chief Complaint  Patient presents with   Follow-up    Follow up for IPF. Pt states he has felt stable since last visit. Pt states he is back on his full dose of pirfenidone now and is tolerating it well. Pt is also on Spirivia daily and no issues noted with that and albuterol as needed. Pt states he has not felt any issues with his breathing since last visit Pt also had PFT on 12/13    HPI Reginald Davis 77 y.o. -returns for standard of care visit.  In this visit he tells me that the back spasm he had he went to physical therapy and  they found out that he had a little bit of right shoulder droop from wearing his portable oxygen this then created  contralateral stretch.  Then after intense physical therapy is better.  He feels this is also helped his lung function.  At last visit he was also having diarrhea.  We stopped the pirfenidone but the diarrhea persisted.  We did extensive stool studies and it was normal.  I referred him to Dr. Jeani Hawking, GI and right before he saw Dr. Elnoria Howard he was actually taking Xifaxan which helped resolve the diarrhea.  He was on pirfenidone holiday from early October 2023 through early November 2023 and he has slowly escalated his pirfenidone back up to the full dose.  Initially he had some diarrhea upon starting pirfenidone but now he is tolerating it well.  He is on full dose.  Through all this since September he is lost another 4 pounds of weight.  He feels that when he was off his pirfenidone his weight loss has stabilized.  Currently the BMI is 24.4 and he is at ideal body weight.  He is also exercising aggressively and he attributes some of the weight loss because of this.  He keeps his oxygen levels greater than 88%.  He has a new year pulse oximeter that he allows him to continuously track his pulse ox.  He is up-to-date with his respiratory vaccines.  He is driving to [place] tomorrow to be with his daughter who is visiting him from [place] for Christmas.  He is still interested in research protocols.  He disqualified from a previous research study because of low T1/FVC ratio screening.  Currently standard of care rate she was fine and DLCO is also stable.  He wants to see if he will rescreen.  He is also interested in the study run by [employer] at another site.  He is going to send me some information to read about that particular product.    PFT  OV 12/20/2022  Subjective:  Patient ID: Reginald Davis, male , DOB: [birth year] , age 69 y.o. , MRN: [id number] , ADDRESS: [address] [place] [address] PCP Rodrigo Ran, MD Patient Care Team: Rodrigo Ran, MD as PCP - General (Internal  Medicine) Regan Lemming, MD as PCP - Electrophysiology (Cardiology) Chilton Si, MD as PCP - Cardiology (Cardiology)  This Provider for this visit: Treatment Team:  Attending Provider: Kalman Shan, MD  12/20/2022 -   Chief Complaint  Patient presents with   Follow-up    Cleda Daub f/u,      HPI Reginald Davis 77 y.o. -returns for follow-up.  Is a 10-month follow-up.  He believes he is overall stable.  But he is worried that he is desaturating a little bit more easily.  He states that 1 time when he walked downhill after walking the dog his pulse ox dropped.  Sometimes at night he is now needing 3 L nasal cannula to avoid transient desaturations into 89%.  He is also noticed some desaturations when he walks.  This is through the EAR pulse ox oximeter probe..  However objectively there is no evidence that he is getting worse.  His symptom scores remained stable [he believes he might be under perceiving his symptoms].  His pulmonary function test also is stable [see below].  His walking desaturation test is also stable along with a 6-minute walk test at [facility].  We took a shared decision making for him to do and an exercise  distance to desaturation at time to desaturation test on his treadmill at the same particular pace and the same incline and measure this every 2 weeks every 4 weeks is a good objective way to monitor if he is desaturating and getting worse or not.  He is going to do that.     Meanwhile he continues to be interested in research protocols.  He is registered with [facility] and is looking at a protocol with [employer].  He had screen failed with [place] because of FEV1 FVC ratio being low.  He says at the location his PFT ratio was normal and he might screen passed.  He did show his liver function test from that visit and it was normal.  This was in the first week of April 2024.  He also showed a CT report.  There is saying there is early evidence of  honeycombing.  However there is no comparison.  I did indicate to him that the report could be inaccurate but also could mean that his IPF is evolving and he is getting honeycombing now.  Regardless his symptoms and pulmonary function test and walk test are stable.  He visited Dr. Karie Mainland 11/02/2022 at [facility] lung transplant clinic.  For the disease was going very slow progression.  And he might not need a lung transplant given his age and slow progression.  He showed a letter dated 11/14/2022 where at this point in time they are putting a pause on his transplant evaluation  He plans to travel to the [place] end of May 2024: He he wants to take emergency pack of Paxlovid, doxycycline and prednisone.  We will do this for him.    OV 04/22/2023  Subjective:  Patient ID: Reginald Davis, male , DOB: Apr 01, 1946 , age 67 y.o. , MRN: [id number] , ADDRESS: [address] [place] [address] PCP Rodrigo Ran, MD Patient Care Team: Rodrigo Ran, MD as PCP - General (Internal Medicine) Regan Lemming, MD as PCP - Electrophysiology (Cardiology) Chilton Si, MD as PCP - Cardiology (Cardiology)  This Provider for this visit: Treatment Team:  Attending Provider: Kalman Shan, MD   04/22/2023 -   Chief Complaint  Patient presents with   Follow-up    4 months f/up, no complaints     HPI Reginald Davis 77 y.o. -returns for follow-up.  Presents with his wife Rayfield Citizen.  She is an independent historian.  After the last visit the trip to [place] got canceled because Rayfield Citizen developed non-COVID respiratory viral infection and was very congested.  This then reduce his exercise.  He was also at the beach last week with his family.  His daughter from [place] and his son-in-law visited them.  Also his daughter from near [place] visited them.  He said because of all this he has gained some weight but he is trying to lose it.  His shortness of breath itself is stable.  At home he monitors his  distance to desaturation.  On his treadmill with a speed of 2.6 on a incline of 0 he desaturates on 1 minute.  He feels this is stable.  However he is concerned that his 6-minute walk distance has dropped.  In February it was 1728 feet at [facility].  Currently in June 2024 is 1535 feet.  The latter was done at [facility] chest.  He says the hallway was crowded and there were people there and was not a straight line.  Nevertheless his pulse ox  did go down to 87%.  He showed me those results.  He also showed his pulmonary function test from [facility] chest as part of the research protocol.  His FVC stable but his DLCO might have declined.  Overall this conflicting data about his decline.  His symptom score and assistance of desaturation at home are stable but his DLCO seems a little worse and then his 6-minute walk test is also a little bit worse but these are all done at different institutions in different set up.  His current therapy is generic pirfenidone along with the [employer] study drug.  He believes he might be getting placebo although he is blinded because he does not have any side effects.   -His FVC in April 2024 3.6 at the study site.  In July 2024 was 3.78.  His DLCO in June 2024 was 12.28.  I reviewed external records for this that he showed me.'   OV 07/22/2023  Subjective:  Patient ID: Reginald Davis, male , DOB: 02-17-1946 , age 70 y.o. 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 based on "probable UIP on CT + negative srology, +  age > 51, + white + male + prior smoker + heart burn/GERD hx-> this is "IPF"  -Last CT scan of  the chest December 2022  - Esbriet start date -> Oct 26, 2020   - Pulm rehab Mar 2022  - Clinical trial education - Mar 2022 (Pliant D ICF given)     - Pliant study participant - onsent 02Jun2022 and was randomized 22Jun2022 -0> completed early (feb) 2023   - [employer] [facility] Vixarelimab study - Screen failed due to low ratio in fev1/fvc   -   - PFF concept introduction - May 2022   -National conference November 2023      Associated mild emphysema present -started Spiriva  Normal Echo May 2021 and summer 2023  WEight loss  April 2022: 171#  June 2023: 162#  SEpt 2023: 158# - bmi 25.14 Aug 2022: 154#- bmi 24.13 December 2022-155 pounds  Aug 2024: 164#   Esbriet/Pirfenidone requires intensive drug monitoring due to high concerns for Adverse effects of , including  Drug Induced Liver Injury, significant GI side effects that include but not limited to Diarrhea, Nausea, Vomiting,  and other system side effects that include Fatigue, headaches, weight loss and other side effects such as skin rash. These will be monitored with  blood work such as LFT initially once a month for 6 months and then quarterly   07/22/2023 -  No chief complaint on file.    HPI Reginald Davis 77 y.o. -   SYMPTOM SCALE - ILD 09/29/2020  11/10/2020 Now on esbriet sinc 10/26/20 12/21/2020 esbriet + 171# 08/22/2021 Esbriet and Pliant study drug , 173# 11/28/2021 Esbriet, off plaint study, 2L [place] night, 3L [place] exercise, 163# intential weight loss 03/02/2022 162# night o2 , ex o2 subjectively 06/07/2022 Uses exercise oxygen 08/27/2022 154#  - diarrhea sept/oc and took esbriet holiday early oc-nov 2023 and needex xifaxan. BAck on esbrie at time of this visit 12/20/2022  04/22/2023 Generic pirfenidone _ BMS study drug vial  [facility]  O2 use ra  ra a t rest 2-3L with exercise pulsed 2L [place] at night, 3L pulsed during exerise and travels        Shortness of Breath 0 -> 5 scale with 5 being worst (score 6 If unable to do)            At rest 0.5 0 0.5 1 1 1  0.5 1 1  0/5  Simple tasks - showers, clothes change, eating, shaving 1 00 0 0 1 1 1 1 1 0   Household (dishes, doing bed, laundry) 1 0 0.5 1 2  1.5 1 2 2 1   Shopping 1 0 0.5 1 2  1.5 1 1.5 1 1   Walking level at own pace 1.5 1 1  0 2 2 2 2 2 1   Walking up Stairs *2.25 2 2 2 3 3 2  2.5 3 1.5  Total (30-36) Dyspnea Score 7.25 3 6 5 11 10  7.5 10 10 5   How bad is your cough? Improved with ppi 1 1 ` 1 1 0.5 1 1 1   How bad is your fatigue 0 0 0.5 ` 1 1 1 1 1  0  How bad is nausea 0 0 0 0 0 0 0 0 0 0   How bad is vomiting?  0 00 0 0 0 0 0 0 1 0   How bad is diarrhea? 0.5 coffee 0 0.25 1 1  0 2.5 1 0 0  How bad is anxiety? 0.5 0 0.5 1 0 0 0.5 0 0 0  How bad is depression 0 0 0 0 0 0 0 0  0   Walk test Simple office walk 185 feet x  3 laps goal with forehead probe 08/09/2020  [facility] and HR111. 1296 fet. 0L o2 needed. Pl JR 111/ done 10/24/20  08/22/2021 - [facility] 05/[facility]/21 - walked 1671 feet wihout o2. This is an imprement  03/02/2022  163# -11/01/2021 [facility] the 6-minute walk and completed 174 6 feet.  Lowest pulse ox was 91%.  Heart rate was 107.  Did 15 labs.  Did not need oxygen. Aug 2023 at [facility] feet . Did not need o2 08/27/2022  12/20/2022 11/01/2022 6-minute walk test at [facility] 1728 feet/116 % predicted.  Lowest pulse ox was 91% on room air.  Maximum heart rate was 111 February 26, 2023 research at [facility] chest 6-minute walk test -> 468 m [1610 feet].  Started at 92% ended up at 87%.  This was not a straight hallway and may need zigzag  O2 used ra ra ra ra  ra ra   Number laps completed 3 3 3 3  3 3    Comments about pace normal nL        Resting Pulse Ox/HR 96% and 80/min 96% and 90.min 99% and 60 97% and HR 62  100% and HR 68 95% and HR 67   Final Pulse Ox/HR 94% and 90/min 88% and 97/min 95% and 94 94% and HR 84  97% and HR 106 92% and HR 72   Desaturated </= 88% no yes yno no  no no   Desaturated <= 3% points no Yes, 8  points Yes, 4 Yes, 3 points  Yes, 3 points Yes 3 points   Got Tachycardic >/= 90/min yes yes yes no  yes yes   Symptoms at end of test dyspnea Some dyspnea Mild dyspea none  none no   Miscellaneous comments x   Bris pad  stable        CT Chest data from date: ****  - personally visualized and independently interpreted : *** - my findings are: ***   PFT     Latest Ref Rng & Units 12/20/2022   12:19 PM 08/22/2022    8:53 AM 03/02/2022    8:49 AM 11/25/2020    8:52 AM 07/12/2020   11:53 AM  ILD indicators  FVC-Pre L 3.73  3.94  3.89  3.43  3.36   FVC-Predicted Pre % 105  111  108  94  88   FVC-Post L     3.41   FVC-Predicted Post %     90   TLC L     5.39   TLC Predicted %     83   DLCO uncorrected ml/min/mmHg 15.84  16.25  14.76  13.56  16.88   DLCO UNC %Pred % 71  73  66  60  73   DLCO Corrected ml/min/mmHg 16.03  16.25  14.84  13.45  16.88   DLCO COR %Pred % 72  73  67  60  73       LAB RESULTS last 96 hours No results found.  LAB RESULTS last 90 days Recent Results (from the past 2160 hour(s))  MYOCARDIAL PERFUSION IMAGING     Status: None   Collection Time: 06/26/23 10:15 AM  Result Value Ref Range   Rest Nuclear Isotope Dose 10.2 mCi   Stress Nuclear Isotope Dose 31.9 mCi   Rest HR 58.0 bpm   Rest BP 179/82 mmHg   Peak HR 77 bpm   Peak BP 160/72 mmHg   SSS 0.0    SRS 0.0    SDS 0.0    TID 1.12    LV sys vol 38.0 mL   LV dias vol 93.0 62 - 150 mL   Nuc Stress EF 59 %   ST Depression (mm) 0 mm  CUP PACEART REMOTE DEVICE CHECK     Status: None   Collection Time: 07/15/23  1:00 AM  Result Value Ref Range   Date Time Interrogation Session [id number]    Pulse Generator Manufacturer [employer]    Pulse Gen Model 2272 Assurity MRI    Pulse Gen Serial Number [id number]    Clinic Name [facility]    Implantable Pulse Generator Type Implantable Pulse Generator    Implantable Pulse Generator Implant Date [id number]    Implantable Lead Manufacturer [facility]     Implantable Lead Model [id number] Tendril MRI    Implantable Lead Serial Number [id number]    Implantable Lead Implant Date [id number]    Implantable Lead Location Detail 1 UNKNOWN    Implantable Lead Location [id number]    Implantable Lead Connection Status [id number]    Implantable Lead Manufacturer [facility]    Implantable Lead Model [id number] Tendril MRI    Implantable Lead Serial Number [id number]    Implantable Lead Implant Date [id number]    Implantable Lead Location Detail 1 UNKNOWN    Implantable Lead Location [id number]    Implantable Lead Connection Status [id number]    Lead Channel Setting Sensing Sensitivity 2.0 mV   Lead Channel Setting Sensing Adaptation Mode Fixed Pacing    Lead Channel Setting Pacing Amplitude 2.5 V   Lead Channel Setting Pacing Pulse Width 0.5 ms   Lead Channel Setting Pacing Amplitude 1.25 V   Lead Channel Status NULL    Lead Channel Impedance Value 480 ohm   Lead Channel Sensing Intrinsic Amplitude 5.0  mV   Lead Channel Pacing Threshold Amplitude 1.25 V   Lead Channel Pacing Threshold Pulse Width 0.5 ms   Lead Channel Status NULL    Lead Channel Impedance Value 440 ohm   Lead Channel Sensing Intrinsic Amplitude 7.0 mV   Lead Channel Pacing Threshold Amplitude 1.0 V   Lead Channel Pacing Threshold Pulse Width 0.5 ms   Battery Status MOS    Battery Remaining Longevity 100 mo   Battery Remaining Percentage 80.0 %   Battery Voltage 3.01 V   Brady Statistic RA Percent Paced 2.0 %   Brady Statistic RV Percent Paced 92.0 %   Brady Statistic AP VP Percent 1.9 %   Brady Statistic AS VP Percent 90.0 %   Brady Statistic AP VS Percent 1.0 %   Brady Statistic AS VS Percent 7.8 %         has a past medical history of Burn, Coronary artery disease, Diverticul disease small and large intestine, no perforati or abscess, Exertional dyspnea (06/24/2023), GERD (gastroesophageal reflux disease), Hernia, History of nuclear stress test, Hyperlipidemia, Low back pain, Oxygen toxicity,  Plantar fasciitis, Retinal tear, Right rotator cuff tear, and Shingles.   reports that he quit smoking about 27 years ago. His smoking use included cigarettes. He started smoking about 47 years ago. He has a 40 pack-year smoking history. He has never used smokeless tobacco.  Past Surgical History:  Procedure Laterality Date   EYE SURGERY     PACEMAKER IMPLANT N/A 01/04/2021   Procedure: PACEMAKER IMPLANT;  Surgeon: Regan Lemming, MD;  Location: [facility] INVASIVE CV LAB;  Service: Cardiovascular;  Laterality: N/A;   REFRACTIVE SURGERY     retina repair and retina tear   SHOULDER ARTHROSCOPY     SHOULDER SURGERY     TONSILLECTOMY      No Known Allergies  Immunization History  Administered Date(s) Administered   Fluad Quad(high Dose 65+) 06/07/2022   Influenza Split 09/22/2009, 06/01/2010, 06/07/2011, 05/27/2012, 07/28/2013   Influenza, High Dose Seasonal PF 06/15/2015, 06/05/2016, 06/08/2017, 06/07/2018   Influenza, Quadrivalent, Recombinant, Inj, Pf 06/07/2018, 06/12/2019, 06/22/2020, 06/01/2021   Influenza,inj,Quad PF,6+ Mos 06/18/2014   Moderna Covid-19 Vaccine Bivalent Booster 62yrs & up 06/14/2021, 01/05/2022   PFIZER(Purple Top)SARS-COV-2 Vaccination 10/05/2019, 10/27/2019, 06/03/2020   Pfizer(Comirnaty)Fall Seasonal Vaccine 12 years and older 01/04/2023, 05/30/2023   Pneumococcal Conjugate-13 08/12/2013, 06/13/2014   Pneumococcal Polysaccharide-23 11/04/2000, 09/22/2009, 08/16/2010   Respiratory Syncytial Virus Vaccine,Recomb Aduvanted(Arexvy) 05/21/2022   Td 09/10/1998, 09/22/2009, 12/23/2019   Tdap 10/02/2010   Typhoid Inactivated 09/09/2015   Zoster Recombinant(Shingrix) 01/13/2017, 04/26/2017   Zoster, Live 09/16/2007, 09/22/2009    Family History  Problem Relation Age of Onset   Hyperlipidemia Mother    Diabetes Father    Heart disease Father    Coronary artery disease Brother      Current Outpatient Medications:    albuterol (VENTOLIN HFA) 108 (90 Base)  MCG/ACT inhaler, Inhale 1-2 puffs into the lungs every 6 (six) hours as needed for wheezing or shortness of breath., Disp: 18 g, Rfl: 1   Alirocumab (PRALUENT) 150 MG/ML SOAJ, Inject 150 mg as directed every 30 (thirty) days. , Disp: , Rfl:    allopurinol (ZYLOPRIM) 100 MG tablet, Take 1 tablet by mouth 2 (two) times daily., Disp: , Rfl:    Alpha-Lipoic Acid 200 MG CAPS, Take 1 capsule by mouth in the morning and at bedtime., Disp: , Rfl:    aspirin 81 MG tablet, Take 81 mg by mouth daily., Disp: , Rfl:  atorvastatin (LIPITOR) 20 MG tablet, Take 10 mg by mouth at bedtime., Disp: , Rfl:    Cholecalciferol (VITAMIN D3) 1.25 MG (50000 UT) CAPS, Take once a week for 12 weeks and then once a month on the first of each month, Disp: 30 capsule, Rfl: 5   Coenzyme Q10 300 MG CAPS, Take 1 capsule by mouth every morning., Disp: , Rfl: 0   colchicine 0.6 MG tablet, Take 1 tablet by mouth 2 (two) times daily as needed (gout flare)., Disp: , Rfl:    dorzolamide-timolol (COSOPT) 22.3-6.8 MG/ML ophthalmic solution, Place 1 drop into the right eye 2 (two) times daily., Disp: , Rfl:    doxycycline (VIBRA-TABS) 100 MG tablet, Take 1 tablet (100 mg total) by mouth 2 (two) times daily. (Patient taking differently: Take 100 mg by mouth 2 (two) times daily. PRN), Disp: 10 tablet, Rfl: 0   fluorometholone (FML) 0.1 % ophthalmic suspension, Place 1 drop into the right eye daily., Disp: , Rfl:    Guaifenesin 1200 MG TB12, Take 1,200 mg by mouth 2 (two) times daily., Disp: , Rfl:    Loperamide-Simethicone 2-125 MG TABS, Take 1 tablet by mouth as needed. Up to 4 times a day., Disp: , Rfl:    Multiple Vitamins-Minerals (CENTRUM SILVER PO), Take 1 tablet by mouth every evening., Disp: , Rfl:    OXYGEN, Inhale 2 L into the lungs., Disp: , Rfl:    Pirfenidone 801 MG TABS, TAKE 1 TABLET BY MOUTH 3 TIMES A DAY WITH MEALS, Disp: 270 tablet, Rfl: 1   Polyethyl Glycol-Propyl Glycol (SYSTANE) 0.4-0.3 % SOLN, Apply 1 drop to eye daily  as needed (dry eyes)., Disp: , Rfl:    predniSONE (DELTASONE) 10 MG tablet, Take 4 tabs for 2 days, then 3 tabs for 2 days, 2 tabs for 2 days, then 1 tab for 2 days, then stop. (Patient taking differently: Take 10 mg by mouth as needed. Take 4 tabs for 2 days, then 3 tabs for 2 days, 2 tabs for 2 days, then 1 tab for 2 days, then stop. PRN), Disp: 20 tablet, Rfl: 0   tiotropium (SPIRIVA HANDIHALER) 18 MCG inhalation capsule, INHALE 1 CAPSULE VIA HANDIHALER ONCE DAILY AT THE SAME TIME EVERY DAY, Disp: 30 capsule, Rfl: 12   triamcinolone cream (KENALOG) 0.1 %, Apply topically in the morning and at bedtime., Disp: , Rfl:    valACYclovir (VALTREX) 1000 MG tablet, Take 1 tablet by mouth daily as needed (fever blisters)., Disp: , Rfl:    valsartan (DIOVAN) 320 MG tablet, TAKE 1 TABLET (320 MG TOTAL) BY MOUTH EVERY EVENING., Disp: 90 tablet, Rfl: 3   vitamin C (ASCORBIC ACID) 500 MG tablet, Take 500 mg by mouth every evening., Disp: , Rfl:    XIFAXAN 550 MG TABS tablet, Take 550 mg by mouth 3 (three) times daily. PRN, Disp: , Rfl:       Objective:   There were no vitals filed for this visit.  Estimated body mass index is 26.79 kg/m as calculated from the following:   Height as of 06/26/23: 5\' 6"  (1.676 m).   Weight as of 06/26/23: 75.3 kg.  @WEIGHTCHANGE @  There were no vitals filed for this visit.   Physical Exam   General: No distress. *** O2 at rest: *** Cane present: *** Sitting in wheel chair: *** Frail: *** Obese: *** Neuro: Alert and Oriented x 3. GCS 15. Speech normal Psych: Pleasant Resp:  Barrel Chest - ***.  Wheeze - ***, Crackles - ***,  No overt respiratory distress CVS: Normal heart sounds. Murmurs - *** Ext: Stigmata of Connective Tissue Disease - *** HEENT: Normal upper airway. PEERL +. No post nasal drip        Assessment:     No diagnosis found.     Plan:     Patient Instructions     ICD-10-CM   1. IPF (idiopathic pulmonary fibrosis) (HCC)  J84.112      2. Medication monitoring encounter  Z51.81          #IPF IPF clinically stable on symptoms, home exercise test though DLCO might show some early decline On generic pirfenidone + BMS study drug ; tolerating both well without side effect  Plan   - continue pirfenidone as before - continue study drug - check LFT 04/22/2023 - safety monitoring for study drug via [facility] Chest  - continue daily exercise - keep pulse ox  >-= 88%  - once every few weeks - measure time or distance to desaturation on room air by walking at same pace and incline at treadmill - will use spirometry and dlco from study to monitor     Follow-up - At 15-month standard of care visit with Dr. Marchelle Gearing 30 minutes-  -Symptom questionnaire and walk test at follow-up   FOLLOWUP No follow-ups on file.    SIGNATURE    Dr. Kalman Shan, M.D., F.C.C.P,  Pulmonary and Critical Care Medicine Staff Physician, [facility] Director - Interstitial Lung Disease  Program  Pulmonary Fibrosis [facility] at [facility] [place], [place], [address]  Pager: [phone], If no answer or between  15:00h - 7:00h: call [phone] Telephone: [phone]  3:03 AM 07/22/2023   Moderate Complexity MDM OFFICE  2021 E/M guidelines, first released in 2021, with minor revisions added in 2023 and 2024 Must meet the requirements for 2 out of 3 dimensions to qualify.    Number and complexity of problems addressed Amount and/or complexity of data reviewed Risk of complications and/or morbidity  One or more chronic illness with mild exacerbation, OR progression, OR  side effects of treatment  Two or more stable chronic illnesses  One undiagnosed new problem with uncertain prognosis  One acute illness with systemic symptoms   One Acute complicated injury Must meet the requirements for 1 of 3 of the categories)  Category 1: Tests and documents, historian  Any combination of 3  of the following:  Assessment requiring an independent historian  Review of prior external note(s) from each unique source  Review of results of each unique test  Ordering of each unique test    Category 2: Interpretation of tests   Independent interpretation of a test performed by another physician/other qualified health care professional (not separately reported)  Category 3: Discuss management/tests  Discussion of management or test interpretation with external physician/other qualified health care professional/appropriate source (not separately reported) Moderate risk of morbidity from additional diagnostic testing or treatment Examples only:  Prescription drug management  Decision regarding minor surgery with identfied patient or procedure risk factors  Decision regarding elective major surgery without identified patient or procedure risk factors  Diagnosis or treatment significantly limited by social determinants of health             HIGh Complexity  OFFICE   2021 E/M guidelines, first released in 2021, with minor revisions added in 2023. Must meet the requirements for 2 out of 3 dimensions to qualify.  Number and complexity of problems addressed Amount and/or complexity of data reviewed Risk of complications and/or morbidity  Severe exacerbation of chronic illness  Acute or chronic illnesses that may pose a threat to life or bodily function, e.g., multiple trauma, acute MI, pulmonary embolus, severe respiratory distress, progressive rheumatoid arthritis, psychiatric illness with potential threat to self or others, peritonitis, acute renal failure, abrupt change in neurological status Must meet the requirements for 2 of 3 of the categories)  Category 1: Tests and documents, historian  Any combination of 3 of the following:  Assessment requiring an independent historian  Review of prior external note(s) from each unique source  Review of results of each  unique test  Ordering of each unique test    Category 2: Interpretation of tests    Independent interpretation of a test performed by another physician/other qualified health care professional (not separately reported)  Category 3: Discuss management/tests  Discussion of management or test interpretation with external physician/other qualified health care professional/appropriate source (not separately reported)  HIGH risk of morbidity from additional diagnostic testing or treatment Examples only:  Drug therapy requiring intensive monitoring for toxicity  Decision for elective major surgery with identified pateint or procedure risk factors  Decision regarding hospitalization or escalation of level of care  Decision for DNR or to de-escalate care   Parenteral controlled  substances            LEGEND - Independent interpretation involves the interpretation of a test for which there is a CPT code, and an interpretation or report is customary. When a review and interpretation of a test is performed and documented by the provider, but not separately reported (billed), then this would represent an independent interpretation. This report does not need to conform to the usual standards of a complete report of the test. This does not include interpretation of tests that do not have formal reports such as a complete blood count with differential and blood cultures. Examples would include reviewing a chest radiograph and documenting in the medical record an interpretation, but not separately reporting (billing) the interpretation of the chest radiograph.   An appropriate source includes professionals who are not health care professionals but may be involved in the management of the patient, such as a Clinical research associate, upper officer, case manager or teacher, and does not include discussion with family or informal caregivers.    - SDOH: SDOH are the conditions in the environments where people  are born, live, learn, work, play, worship, and age that affect a wide range of health, functioning, and quality-of-life outcomes and risks. (e.g., housing, food insecurity, transportation, etc.). SDOH-related Z codes ranging from Z55-Z65 are the ICD-10-CM diagnosis codes used to document SDOH data Z55 - Problems related to education and literacy Z56 - Problems related to employment and unemployment Z57 - Occupational exposure to risk factors Z58 - Problems related to physical environment Z59 - Problems related to housing and economic circumstances [phone] - Problems related to social environment [phone] - Problems related to upbringing [phone] - Other problems related to primary support group, including family circumstances Z26 - Problems related to certain psychosocial circumstances Z65 - Problems related to other psychosocial circumstances

## 2023-07-22 NOTE — Patient Instructions (Incomplete)
ICD-10-CM   1. IPF (idiopathic pulmonary fibrosis) (HCC)  J84.112     2. Medication monitoring encounter  Z51.81          #IPF IPF clinically stable on symptoms, home exercise test though DLCO might show some early decline On generic pirfenidone + BMS study drug ; tolerating both well without side effect  Plan   - continue pirfenidone as before - continue study drug - check LFT 04/22/2023 - safety monitoring for study drug via [facility] Chest  - continue daily exercise - keep pulse ox  >-= 88%  - once every few weeks - measure time or distance to desaturation on room air by walking at same pace and incline at treadmill - will use spirometry and dlco from study to monitor     Follow-up - At 21-month standard of care visit with Dr. Marchelle Gearing 30 minutes-  -Symptom questionnaire and walk test at follow-up

## 2023-07-23 ENCOUNTER — Ambulatory Visit (INDEPENDENT_AMBULATORY_CARE_PROVIDER_SITE_OTHER): Payer: Medicare Other | Admitting: Internal Medicine

## 2023-07-23 ENCOUNTER — Encounter: Payer: Self-pay | Admitting: Internal Medicine

## 2023-07-23 VITALS — BP 130/66 | HR 61 | Temp 97.5°F | Ht 66.0 in | Wt 166.0 lb

## 2023-07-23 DIAGNOSIS — Z5181 Encounter for therapeutic drug level monitoring: Secondary | ICD-10-CM

## 2023-07-23 DIAGNOSIS — J84112 Idiopathic pulmonary fibrosis: Secondary | ICD-10-CM

## 2023-07-24 DIAGNOSIS — R7989 Other specified abnormal findings of blood chemistry: Secondary | ICD-10-CM | POA: Diagnosis not present

## 2023-07-24 DIAGNOSIS — J8410 Pulmonary fibrosis, unspecified: Secondary | ICD-10-CM | POA: Diagnosis not present

## 2023-07-24 DIAGNOSIS — M47816 Spondylosis without myelopathy or radiculopathy, lumbar region: Secondary | ICD-10-CM | POA: Diagnosis not present

## 2023-07-24 DIAGNOSIS — J9611 Chronic respiratory failure with hypoxia: Secondary | ICD-10-CM | POA: Diagnosis not present

## 2023-07-24 DIAGNOSIS — K4090 Unilateral inguinal hernia, without obstruction or gangrene, not specified as recurrent: Secondary | ICD-10-CM | POA: Diagnosis not present

## 2023-07-24 DIAGNOSIS — I2583 Coronary atherosclerosis due to lipid rich plaque: Secondary | ICD-10-CM | POA: Diagnosis not present

## 2023-07-24 DIAGNOSIS — M10072 Idiopathic gout, left ankle and foot: Secondary | ICD-10-CM | POA: Diagnosis not present

## 2023-07-24 DIAGNOSIS — J479 Bronchiectasis, uncomplicated: Secondary | ICD-10-CM | POA: Diagnosis not present

## 2023-07-24 DIAGNOSIS — R7301 Impaired fasting glucose: Secondary | ICD-10-CM | POA: Diagnosis not present

## 2023-07-24 DIAGNOSIS — J439 Emphysema, unspecified: Secondary | ICD-10-CM | POA: Diagnosis not present

## 2023-07-24 DIAGNOSIS — I447 Left bundle-branch block, unspecified: Secondary | ICD-10-CM | POA: Diagnosis not present

## 2023-07-24 DIAGNOSIS — R972 Elevated prostate specific antigen [PSA]: Secondary | ICD-10-CM | POA: Diagnosis not present

## 2023-08-06 NOTE — Progress Notes (Signed)
Remote pacemaker transmission.   

## 2023-08-10 DIAGNOSIS — R109 Unspecified abdominal pain: Secondary | ICD-10-CM | POA: Diagnosis not present

## 2023-08-10 DIAGNOSIS — M545 Low back pain, unspecified: Secondary | ICD-10-CM | POA: Diagnosis not present

## 2023-08-31 DIAGNOSIS — R109 Unspecified abdominal pain: Secondary | ICD-10-CM | POA: Diagnosis not present

## 2023-08-31 DIAGNOSIS — M545 Low back pain, unspecified: Secondary | ICD-10-CM | POA: Diagnosis not present

## 2023-09-10 ENCOUNTER — Other Ambulatory Visit: Payer: Self-pay | Admitting: Cardiovascular Disease

## 2023-09-14 DIAGNOSIS — M545 Low back pain, unspecified: Secondary | ICD-10-CM | POA: Diagnosis not present

## 2023-09-14 DIAGNOSIS — R109 Unspecified abdominal pain: Secondary | ICD-10-CM | POA: Diagnosis not present

## 2023-10-12 DIAGNOSIS — M545 Low back pain, unspecified: Secondary | ICD-10-CM | POA: Diagnosis not present

## 2023-10-12 DIAGNOSIS — R109 Unspecified abdominal pain: Secondary | ICD-10-CM | POA: Diagnosis not present

## 2023-10-14 ENCOUNTER — Ambulatory Visit: Payer: Medicare Other

## 2023-10-14 DIAGNOSIS — I442 Atrioventricular block, complete: Secondary | ICD-10-CM

## 2023-10-14 LAB — CUP PACEART REMOTE DEVICE CHECK
Battery Remaining Longevity: 98 mo
Battery Remaining Percentage: 78 %
Battery Voltage: 3.01 V
Brady Statistic AP VP Percent: 1.3 %
Brady Statistic AP VS Percent: 1 %
Brady Statistic AS VP Percent: 79 %
Brady Statistic AS VS Percent: 20 %
Brady Statistic RA Percent Paced: 1.4 %
Brady Statistic RV Percent Paced: 80 %
Date Time Interrogation Session: 20250203020015
Implantable Lead Connection Status: 753985
Implantable Lead Connection Status: 753985
Implantable Lead Implant Date: 20220427
Implantable Lead Implant Date: 20220427
Implantable Lead Location: 753859
Implantable Lead Location: 753860
Implantable Pulse Generator Implant Date: 20220427
Lead Channel Impedance Value: 450 Ohm
Lead Channel Impedance Value: 460 Ohm
Lead Channel Pacing Threshold Amplitude: 0.875 V
Lead Channel Pacing Threshold Amplitude: 1.25 V
Lead Channel Pacing Threshold Pulse Width: 0.5 ms
Lead Channel Pacing Threshold Pulse Width: 0.5 ms
Lead Channel Sensing Intrinsic Amplitude: 5 mV
Lead Channel Sensing Intrinsic Amplitude: 6.3 mV
Lead Channel Setting Pacing Amplitude: 1.125
Lead Channel Setting Pacing Amplitude: 2.5 V
Lead Channel Setting Pacing Pulse Width: 0.5 ms
Lead Channel Setting Sensing Sensitivity: 2 mV
Pulse Gen Model: 2272
Pulse Gen Serial Number: 3920271

## 2023-10-18 ENCOUNTER — Other Ambulatory Visit: Payer: Self-pay | Admitting: Internal Medicine

## 2023-10-18 DIAGNOSIS — J84112 Idiopathic pulmonary fibrosis: Secondary | ICD-10-CM

## 2023-10-18 NOTE — Telephone Encounter (Signed)
 Refill sent for PIRFENIDONE  to [facility] (pulmonary fibrosis team): [phone]  Dose: 801mg  three times daily  Last OV: 07/23/2023 Provider: Dr. Geronimo Pertinent labs: LFTS 07/19/23 - [facility] tab  Next OV: 11/04/2023  Sherry Pennant, PharmD, MPH, BCPS Clinical Pharmacist (Rheumatology and Pulmonology)

## 2023-10-29 DIAGNOSIS — D485 Neoplasm of uncertain behavior of skin: Secondary | ICD-10-CM | POA: Diagnosis not present

## 2023-10-29 DIAGNOSIS — L820 Inflamed seborrheic keratosis: Secondary | ICD-10-CM | POA: Diagnosis not present

## 2023-11-03 ENCOUNTER — Other Ambulatory Visit: Payer: Self-pay | Admitting: Internal Medicine

## 2023-11-04 ENCOUNTER — Encounter: Payer: Self-pay | Admitting: Internal Medicine

## 2023-11-04 ENCOUNTER — Ambulatory Visit (INDEPENDENT_AMBULATORY_CARE_PROVIDER_SITE_OTHER): Payer: Medicare Other | Admitting: Internal Medicine

## 2023-11-04 VITALS — BP 158/77 | HR 68 | Temp 97.4°F | Ht 66.0 in | Wt 169.8 lb

## 2023-11-04 DIAGNOSIS — R972 Elevated prostate specific antigen [PSA]: Secondary | ICD-10-CM | POA: Diagnosis not present

## 2023-11-04 DIAGNOSIS — Z5181 Encounter for therapeutic drug level monitoring: Secondary | ICD-10-CM

## 2023-11-04 DIAGNOSIS — R0902 Hypoxemia: Secondary | ICD-10-CM

## 2023-11-04 DIAGNOSIS — R0609 Other forms of dyspnea: Secondary | ICD-10-CM | POA: Diagnosis not present

## 2023-11-04 DIAGNOSIS — J84112 Idiopathic pulmonary fibrosis: Secondary | ICD-10-CM | POA: Diagnosis not present

## 2023-11-04 DIAGNOSIS — E559 Vitamin D deficiency, unspecified: Secondary | ICD-10-CM | POA: Diagnosis not present

## 2023-11-04 NOTE — Progress Notes (Signed)
 OV 08/09/2020  Subjective:  Patient ID: Reginald Davis, male , DOB: 1946/06/18 , age 78 y.o. , MRN: [id number] , ADDRESS: [address] [place] [address] PCP Rodrigo Ran, MD Patient Care Team: Rodrigo Ran, MD as PCP - General (Internal Medicine) End, Cristal Deer, MD as PCP - Cardiology (Cardiology) Cardiologist is Dr. Andreas Ohm This Provider for this visit: Treatment Team:  Attending Provider: Kalman Shan, MD    08/09/2020 -   Chief Complaint  Patient presents with   Consult    COPD, DOE     HPI Reginald Davis 78 y.o. 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  History is given by the patient who also brought in very clearly typed notes about his history.  He says in the 1970s and 1980s while he was busy with his professional life he was a smoker and finally quit smoking in 1997.  His total pack smoking history might be 70 years.  He undergoes regular screening CT scans of the chest.  Most recently March 2021 that reported signs of emphysema and a small upper lobe nodule unchanged over many years and possible interstitial lung disease changes.  He says he was at baseline and doing well but in April 2021 he started noticing shortness of breath.  He was placed on Spiriva which improved his symptoms daily.  After that he felt like significantly improved.  He then was dealing with significant difficult to control blood pressures with Dr. Delton See.  She has increased his dosage of the blood pressure medication.  He was remaining active walking approximately thousand steps daily and 30 minutes of walking 5 more days per week.  Then in September 2020 when he and his wife traveled to [place] and then drove to [place] which is an elevation of 4000-4500 feet.  After that they continue to [facility] at 6500-7500 feet.  They got very short of breath.  Even had a fever episode.  His pulse oximetry dropped to 84-85.  He ended up in the emergency room and  was given a diagnosis of COPD exacerbation.  Given doxycycline and prednisone and then he improved.  Post improvement in September and post return to [facility] he has had difficult to control blood pressure but he does have some residual shortness of breath with exertion relieved by rest.  His apple watch has noticed desaturations at night that are transient with pulse ox ranging 85 to 96%.  His resting heart rate at bed is between 54 and 90 at night.  His current walking desaturation test is below.  He dropped only 2 points but he did get tachycardic.  His pulmonary function test show slight reduction in DLCO but otherwise okay  He follows with Dr. Delton See who noticed he has left lower lobe crackles.  Due to persistent symptoms he has been referred to pulmonary clinic.  He wants to get to the bottom of the issues.  His echocardiogram shows grade 1 diastolic dysfunction.  He does suffer from some visceral obesity.   Of note he has upcoming air travel to [place] on August 15, 2020.  He is going to see his granddaughter who is not seen since the onset of the pandemic.  He believes he may not be traveling to high altitudes in [place].    ADDENDUM: There is a right lower lobe nodule that is seen between vessels in the right lower lobe on image 97 of series 8. This  nodule was compared to studies dating back to 2015 and shows no change. It is possible that this represents a lymph node along bronchovascular structures but again this is stable over a 6 year interval, obscured by surrounding vessels and bronchovascular structures.     Electronically Signed   By: Donzetta Kohut M.D.   On: 11/27/2019 14:45    Signed by Jule Economy, MD on 11/27/2019  2:48 PM  Narrative & Impression  CLINICAL DATA:  Left upper lobe nodules, history of bronchiectasis and emphysema.   EXAM: CT CHEST WITHOUT CONTRAST   TECHNIQUE: Multidetector CT imaging of the chest was performed following the standard  protocol without IV contrast.   COMPARISON:  Coronary CT from 08/26/2018, CT chest from 09/20/2017   FINDINGS: Cardiovascular: Calcified atherosclerotic changes in the thoracic aorta without signs of aneurysm. Limited assessment on noncontrast evaluation. The heart size is normal without pericardial effusion. Coronary artery disease with three-vessel calcification as before.   Mediastinum/Nodes: Thoracic inlet structures are normal. No axillary lymphadenopathy. Small right juxta hilar lymph node, 9 mm unchanged since January of 2019 along with other scattered lymph nodes below CT criteria for pathologic enlargement. Limited assessment of hilar structures due to lack of contrast. Esophagus is mildly patulous with some gas in the lumen.   Lungs/Pleura: Small nodule between bronchovascular structures in the central portion of the right lower lobe (image 97, series 8) 11 x 7 mm, within 1 mm of its size in 2019. No dense consolidation. Mild basilar bronchiectasis. Subpleural reticulation as before with signs of centrilobular emphysema.   Tiny nodules in the left upper lobe are stable.   Upper Abdomen: Incidentally imaged portions of upper abdominal contents are unremarkable. No evidence of acute upper abdominal process.   Musculoskeletal: No chest wall mass. Sclerosis of the right clavicular head is unchanged since 2019. Spinal degenerative changes as before.   IMPRESSION: 1. Small nodule between bronchovascular structures in the central portion of the right lower lobe measuring 11 x 7 mm, within 1 mm of its size in 2019. This nodule is within 1 mm of its size in 2019. This nodule is unchanged since 2015 and this therefore benign. 2. Signs of emphysema and chronic interstitial changes with tiny upper lobe nodules are unchanged. 3. Coronary artery disease with three-vessel calcification.   Aortic Atherosclerosis (ICD10-I70.0) and Emphysema (ICD10-J43.9).   Electronically  Signed: By: Donzetta Kohut M.D. On: 11/26/2019 09:11     08/29/2020- Interim hx Patient presents today to review testing and discuss starting anti-fiibrotic medication. CT imaging suggestive of probable UIP with progression since 2015, high suspicion towards IPF. He had negative serology. Dr. Marchelle Gearing recommend starting anti-fibrotic, either Esbriet or OFEV. Accompanied by his wife during today's appointment. He is baseline today, shortness of breath and cough are the same as last visit. He was recently in [place] for 10 days, he did not need to take on hand doxycyline or prednisone. He had an overnight oximetry test on12/2/21 which showed <88% 5 hrs 35 mins, start 2L [place]. He has not received oxygen yet, he was contacted by DME company. He is interested in [employer] continuous oxygen concentrator and is willing to pay out of pocket in needed. He reports daytime oxygen saturation runs between 87-92% RA. He is active, gets an average of 8,000 steps a day. He will be changing medicare part D plans to [employer] silver come January 1st 2022.     OV 09/29/2020  Subjective:  Patient ID: Reginald Davis,  male , DOB: 11-01-1945 , age 38 y.o. , MRN: [id number] , ADDRESS: [address] [place] [address] PCP Rodrigo Ran, MD Patient Care Team: Rodrigo Ran, MD as PCP - General (Internal Medicine) End, Cristal Deer, MD as PCP - Cardiology (Cardiology)  This Provider for this visit: Treatment Team:  Attending Provider: Kalman Shan, MD    09/29/2020 -   Chief Complaint  Patient presents with   Follow-up    6 min walk performed 09/22/20.  Pt states he feels like he has been stable since last visit. Pt is wearing 2L at night.  Pt states if he is on an incline, his sats are dropping some below 88% on room air.    IPF dx givien 08/26/20 based on "probable UIP on CT + negative srology, +  age > 46, _ white + male + prior smoker + heart burn/GERD hx-> this is "IPF"  Associated mild emphysema present  -started Spiriva  GERD hx  Pirfenidone start this pending January 2022/February 2022  HPI BRADSHAW MINIHAN 78 y.o. -returns for follow-up.  He presents with his wife.  Since his last visit in the interim he saw nurse practitioner.  We gave him a diagnosis of IPF based on probable UIP on CT scan age greater than 107, Caucasian ethnicity, male gender, prior smoker, acid reflux history and negative serology.  He has accepted the diagnosis.  He presents with his wife at this point in time.  They have gone through pirfenidone education and started paperwork.  He is waiting for co-pay approval.  At this point in time he feels stable his symptom score is below.  He is completed a 6-minute walk test but he did not desaturate below 88%.  The focus of this visit is multiple questions in general education and answering questions about idiopathic pulmonary fibrosis  -Overall his cough is improved.  He attributes his cough improvement to omeprazole.  However he wants to switch to pantoprazole.  He did some reading and he said that he is concerned about theoretical effects of omeprazole and pirfenidone plasma levels therefore he wants to switch to pantoprazole.  I have supported this and will do the prescription.  -He is very interested in clinical trials as a care option.  We discussed concept of clinical trials being voluntary and what care option meant.  He is probably leaning towards later phase trials.  We gave him consent form for PROMEDIOR Phase 3 and Pliant phase 2 studies but did indicate to him that there is currently a backlog and in addition he will have to be stable on antifibrotic's for a few months before he can enroll.  He is understanding of this.  -He is interested in the patient support group and I have given him the email for a local support group to the pulmonary fibrosis foundation  -He exercises regularly.  However he is interested in joining pulmonary rehabilitation a referral has been made  but he is still waiting to hear from them.  I have emailed the director to get a follow-up.  -We discussed the natural history of pulmonary fibrosis.  Especially IPF.  We discussed its progressive disease.  He and his wife wanted to know about prognostic markers.  Currently these are not commercially available.  In particular is interested in Kale-6.  Indicated that in the next few years, she will test might be available to differentiate prognosis but at this time we would follow clinically.  Currently based on current severity and stability  predicts future progression.  Also explained viral illnesses can exacerbate diseases  - Lung transplantation: He is interested in this concept.  He wants early referral to [facility] so he gets his options covered.  He knows he has to lose weight.  We discussed common barriers for lung transplantation including psychological Support, social support, financial costs.  He says he does not have any of this.  His BMI is 29 he needs to get to less than 27.  Explained to him nutrition is the way to go on this.  He is also going to do rehabilitation   - We also discussed his emphysema component being small.  He will continue with Spiriva  = Oxygenation -he is using nocturnal oxygen.  His apple watch shows his oxygen levels to be normal now at night.  He is monitoring this.  He says that when he exerts heavily his oxygen does drop but on the 6-minute walk test he did not drop.  He will pay for a portable oxygen system on his own out-of-pocket.  Therefore we will do an order for this.        OV 11/10/2020  Subjective:  Patient ID: Reginald Davis, male , DOB: 1945/09/29 , age 57 y.o. , MRN: [id number] , ADDRESS: [address] [place] [address] PCP Rodrigo Ran, MD Patient Care Team: Rodrigo Ran, MD as PCP - General (Internal Medicine) End, Cristal Deer, MD as PCP - Cardiology (Cardiology)  This Provider for this visit: Treatment Team:  Attending  Provider: Kalman Shan, MD    11/10/2020 -   Chief Complaint  Patient presents with   Follow-up    Doing well    IPF dx givien 08/26/20 based on "probable UIP on CT + negative srology, +  age > 51, _ white + male + prior smoker + heart burn/GERD hx-> this is "IPF"  -Last CT scan of the chest December 2021  - Esbriet start date -> Oct 26, 2020   - Pulm rehab Mar 2022   Associated mild emphysema present -started Spiriva    HPI ALEEM ELZA 78 y.o. -presents with his wife. This is for IPF follow-up. He finally got hold of his pirfenidone. He started this 10/26/2020. So far he is tolerating it well. Earlier this week on 11/08/2020 he started pulmonary rehabilitation. He feels stable. Wife feels he is an under perceiver. On a 6-minute walk test he dropped to as low as 89%. He had a pulmonary function test at [facility] that showed decline in DLCO. He is worried about this. We walked him again today and his pulse ox did drop to 88%. That could have been a variation in place but clearly this a difference suggesting progression. However he is tolerating his pirfenidone fine. No side effects as evidenced below in the symptom score. He really wants to start ASAP on clinical trials. He is already met with [facility] for transplant as an option. He had to consent forms one for phase 3 IV infusion PRomedior and another for Phase 2 Pliant Part C. The latter has moved to Part D which is higher dose. He had more questions about this. We went over several components of clinical trials documented below. Explained to him about therapeutic misconception and secondary intent being therapeutic gain. Advised him that the primary intent should always be about drug development and contribution to science. He understood this. Explained to him currently there is supply chain issues with a phase 3 study. However the  phase 2 sutdy is active. However he were to wait a few months before he is eligible for either  study because he just started on pirfenidone. He understands this. We have given him an updated consent form. PFT [facility] feb [facility]22 FVC 3.55L DLCO 11.02    Telephone call 11/30/20   Nov 2021 -> March 2022 with [place]  - FVC up - dlco down  Compared to [facility] feb 2022  -March 2022 with  [place]  - FVC down  - DLCO up   Overall  - would stay stable  - there is mixed signal - nothing to do for now  - allow anti fibrotic kick in  - be patient   OV 12/21/2020  Subjective:  Patient ID: Reginald Davis, male , DOB: 18-Aug-1946 , age 52 y.o. , MRN: [id number] , ADDRESS: [address] [place] [address] PCP Rodrigo Ran, MD Patient Care Team: Rodrigo Ran, MD as PCP - General (Internal Medicine) End, Cristal Deer, MD as PCP - Cardiology (Cardiology)  This Provider for this visit: Treatment Team:  Attending Provider: Kalman Shan, MD    12/21/2020 -   Chief Complaint  Patient presents with   Follow-up    PFT performed 11/25/20.  Pt states he has been doing okay and denies any complaints.     IPF dx givien 08/26/20 based on "probable UIP on CT + negative srology, +  age > 46, + white + male + prior smoker + heart burn/GERD hx-> this is "IPF"  -Last CT scan of the chest December 2021  - Esbriet start date -> Oct 26, 2020   - Pulm rehab Mar 2022  - Clinical trial education - Mar 2022 [facility] D ICF given)  - PFF concept introduction - May 2022   Associated mild emphysema present -started Spiriva  Normal Echo May 2021  HPI Reginald Davis 78 y.o. -returns for follow-up with his wife.  He is now on pirfenidone since mid 01/27/2021 and is tolerating it well.  He reports intentional weight loss.  He has some mild GI side effects that are reflected in the symptom score and this is stable.  He says with a combination of intentional weight loss and rehabilitation and oxygen use [with exertion] his resting heart rate is now in the 60s.  He showed me his apple watch curve about that.  He is  pretty excited about that.  He has had [place] for short of Covid vaccine mRNA.  He is really interested in clinical trials.  He has met with the research coordinator and is scheduled himself for Pliant study visit mid May 2022.  He has read the consent.  He is keeping up with the literature on pulmonary fibrosis.  He is pretty excited about an upcoming IND application approval for the pharmaceutical company [employer] for [employer] BUI [id number].  We will be a study site for this but probably in the fall or the winter 2022.  I told him I do not know much details about this drug as yet.  He says he will start the Pliant study currently.  He wants to know about travel.  He has some prednisone and hand and also antibiotics.  We will go to [place] and [place].  He will take his portable oxygen system with him.  I have approved this travel.  At room air at rest he is fine.  When he exerts himself with treadmill and exercise he uses 2-3 L pulse.  Most recent  liver function test was normal.  This was in December 12, 2020.  He is worried about his progression.  Particular [facility]'s test showed a drop.  He then had pulmonary function test with [place].  I put these results together.  It appears that there is lot of fluctuation particularly with the [facility] test.  But on average it appears his DLCO is declined since he has been with [place] but his FVC is stable.   He is enjoying his rehabilitation.  PFT   Subjective:  Patient ID: Reginald Davis, male , DOB: [birth year] , age 66 y.o. , MRN: [id number] , ADDRESS: [address] [place] [address] PCP Rodrigo Ran, MD Patient Care Team: Rodrigo Ran, MD as PCP - General (Internal Medicine) Regan Lemming, MD as PCP - Electrophysiology (Cardiology) Chilton Si, MD as PCP - Cardiology (Cardiology)  This Provider for this visit: Treatment Team:  Attending Provider: Kalman Shan, MD    08/22/2021 -   Chief Complaint  Patient presents  with   Follow-up    Pt states he has had diarrhea for the past 6 weeks and is unsure if it is due to his medication.     IPF dx givien 08/26/20 based on "probable UIP on CT + negative srology, +  age > 50, + white + male + prior smoker + heart burn/GERD hx-> this is "IPF"  -Last CT scan of the chest December 2021  - Esbriet start date -> Oct 26, 2020   - Pulm rehab Mar 2022  - Clinical trial education - Mar 2022 [facility] ICF given)     - Pliant study participant Tyson Alias 02Jun2022 and was randomized 22Jun2022  - PFF concept introduction - May 2022   Associated mild emphysema present -started Spiriva  Normal Echo May 2021  HPI Reginald Davis 78 y.o. -   IPF: [facility] visit for OPF. On esbrit and also study (IMP v placebo). Has study  visit#10 in 19Dec, then come back for EOT on 30Jan and EOS on 14Feb and then he is done.  From a respiratory standpoint his symptom scores are slowly actually improved.  His FVC also shows slow improvement over time.  In August 2022 he had a walk test at [facility] and this shows improvement in walking distance.  His weight is stable.  He had recent high-resolution CT chest.  Compared to September 2022 his ILD is stable but compared to remote past there is progression.  Overall he is pleased with the symptoms and stability.  He uses oxygen 2 L at night and 3 L pulsed with exercise and while traveling.  He continues on pirfenidone and study drug.  He should finish the study next month or so.  Rash: Sometime around September 2022 or so he developed punctate rash in his forearms and his back.  He says this still persist but it is stable its not any worse.  He is not overly concerned about this  Main new issue is that since the last 6 weeks he has had diarrhea.  Started as mild but since then is gotten progressive.  He says currently it is severe.  He is having a lot of flatulence and when he has significant amount of flatulence he is having some amount of stool in  his underwear.  For the last 3 weeks he is taking align probiotic.  For the last 1 week he is also taking a protein shake but he says there is  no sweetener in it.  For the last 1 week he is started Imodium.  After this diarrhea is better.  He still having diarrhea 3-4 times a day.  He is taking Imodium 3-4 times a day.  The stool is definitely more formed but it still worrisome for him.  He says he is cut down on nuts and fruits.  He denies any artificial sweetener.  Did medication review for diarrhea causing drugs: The only new introduction is a study drug and prior to that was the pirfenidone.  However he is on other drugs chronically that can cause diarrhea and this includes colchicine greater than 20%, Diovan greater than 5% and PPI.  Initially did not complain about pain but when I asked him he said sometimes he has some right lower quadrant discomfort.  There is no fever or bloody stools.  He had recent safety labs on 08/15/2021.  I personally reviewed this and this is normal.     Upcoming travel: He is plan to go to [place] in January 2023.  This is for 1-2 weeks and is to visit his daughter and his only grandchild.  He wants his diarrhea significantly addressed before the trip.   OV 11/28/2021  Subjective:  Patient ID: Reginald Davis, male , DOB: 08/21/1946 , age 15 y.o. , MRN: [id number] , ADDRESS: [address] [place] [address] PCP Rodrigo Ran, MD Patient Care Team: Rodrigo Ran, MD as PCP - General (Internal Medicine) Regan Lemming, MD as PCP - Electrophysiology (Cardiology) Chilton Si, MD as PCP - Cardiology (Cardiology)  This Provider for this visit: Treatment Team:  Attending Provider: Kalman Shan, MD    11/28/2021 -   Chief Complaint  Patient presents with   Follow-up    Pt states he has been doing okay since last visit.   HPI ZACKERIAH KISSLER 78 y.o. -returns for follow-up.  Presents with his wife.  He tells me that he is doing well overall.   After coming off the pliant study in February 2023 for 2 weeks he thought he had increased nocturnal desaturations but then this settled out and he is better.  He is intentionally lost weight.  He did go for a lung transplant evaluation at [facility] 11/01/2021.  His 6-minute walking test is improved.  His pulmonary function test also has improved [see below].  He attributes this improvement to being on the research study protocol.  We do not know if he was randomized to placebo or the actual medicine of product.  He believes he was randomized to the actual medicine product.  He did have some increased diarrhea during the study and he says this is now improved.  He is interested in more studies.  Although the current improvement in diarrhea is also because he is taking Imodium on a scheduled basis.  He continues to use 2 L at night and 3 L with exercise although 6-minute walk test had an improved distance and did not desaturate below 91% and he finished 15 laps at [facility].  He is interested in more clinical trials.  We we discussed a phase 2 oral anticollagen inhibitor study by [employer] sponsor.  He has taken the consent to review.  He is also interested in the future injection study that he came across in the Internet.  It is sponsored by [employer].  Were not a location for the study at this point in time but did indicate to him that we are in communication and do  not have much information to share with him about that.  He is going to read the Internet and let us know what study he wants to do.  He continues to have dry skin.  We talked about flaxseed  He has tended travel to [place] in July 2023.  He wants [place] Paxlovid refilled.  He also wants doxycycline and prednisone refill for the trip.  I agreed to do that.    Subjective:  Patient ID: Reginald Davis, male , DOB: [birth year] , age 35 y.o. , MRN: [id number] , ADDRESS: [address] [place] [address] PCP Rodrigo Ran, MD Patient Care  Team: Rodrigo Ran, MD as PCP - General (Internal Medicine) Regan Lemming, MD as PCP - Electrophysiology (Cardiology) Chilton Si, MD as PCP - Cardiology (Cardiology)  This Provider for this visit: Treatment Team:  Attending Provider: Kalman Shan, MD    03/02/2022 -   Chief Complaint  Patient presents with   Follow-up    Pt here for f/u on ILD, states breathing has been good some SOB increased coughing.      HPI STIVEN KASPAR 78 y.o. -returns for follow-up.  He has completed his pliant research protocol in February 2023.  He continues his as..  He uses 2 L nasal cannula at night.  He uses oxygen with exertion.  Symptom score shows stable.  Pulmonary function test is stable.  However he feels that at night his Apple Watch on 2 L nasal cannula showing more desaturations than usual.  We did discuss about getting this formally tested with a overnight pulse oximetry to see if he would need more oxygen.  He is agreeable to this plan.  I did indicate to him that given stability in symptoms and pulmonary function test and the walking desaturation test  -pretest probably due for him desaturating at night more than usual would be low.  Is also willing to get echocardiogram and a BNP check.  Depending on this we might have to consider right heart catheterization if for WHO group 3 pulmonary hypertension is in the differential diagnosis.  But overall I think the probably of pulmonary hypertension is also low given the stability.   He continues to be interested in research protocols.  He is particularly interested in a study called  MOONSCAPE -which involves the STAT3 pathway against IPF and it would involve a subcutaneous injection.  He is reading a consent form.  He is willing to participate if he passes on prescreen.    He also believes that his acid reflux is slightly more active.  He is taking his PPI.  He is doing all the other measures.  But did notice that he is taking fish oil.   He takes this for his hyperlipidemia.  I did indicate to him side effect of fish oil as acid reflux.  He is now on Praluent for his hyperlipidemia which is working really well.  Therefore he is willing to stop the fish oil.   In terms of dry skin: He takes some flaxseed he is putting a new lotion and this is better.  In terms of travel he is going to go to [place] again to see his grandchild.  This will be in July 2023.  In terms of his complete heart block: He is on pacer but he believes the pacer is more active now.  OV 06/07/2022  Subjective:  Patient ID: Reginald Davis, male , DOB: Jul 13, 1946 , age 41 y.o. ,  MRN: [id number] , ADDRESS: [address] [place] [address] PCP Rodrigo Ran, MD Patient Care Team: Rodrigo Ran, MD as PCP - General (Internal Medicine) Regan Lemming, MD as PCP - Electrophysiology (Cardiology) Chilton Si, MD as PCP - Cardiology (Cardiology)  This Provider for this visit: Treatment Team:  Attending Provider: Kalman Shan, MD    06/07/2022 -   Chief Complaint  Patient presents with   Follow-up    PFT was cancelled by pt.  Pt states he has had a few episodes where he has noticed lower O2 sats than before which was seen on walk test done by [facility]. Pt also states he has had a little more coughing than usual.     HPI Reginald Davis 78 y.o. -returns for follow-up this is standard of care visit.  I saw him 2 days ago as a research visit.  He enrolled into a new study.  The study involved a subcutaneous injection he signed consent.  He had screening pulmonary function test documented below.  For the first time his FEV1 FVC ratio is below 70.  He did stop Spiriva before the breathing test based on protocol requirements.  In review of his pulmonary function test he had end of August another pulmonary function test at [facility] and even that the ratio has gone down.  Prior to that his FEV1 FVC ratio is always over 70.  The first time this is gone  down.  I did indicate to him I do not understand why but I have a few patients this is happened.  He feels stable.  His 6-minute walk test at [facility] was stable.  His symptom scores are stable.  Uses oxygen with exercise.  His wife wanted me to caution him not to over exercise and desaturate too much.  I did tell him to keep his pulse ox over 88%.  He is very interested in clinical trials but he understands that if his FEV1 FVC ratio is less than 70 he will disqualify for many trials.  He says he has come to terms with this.  He is on Esbriet and is tolerating this well without any problems.  He did have [facility] lung transplant visit I reviewed those notes.  They feel he is too early for transplant.    OV 08/27/2022  Subjective:  Patient ID: Reginald Davis, male , DOB: 10-Jan-1946 , age 48 y.o. , MRN: [id number] , ADDRESS: [address] [place] [address] PCP Rodrigo Ran, MD Patient Care Team: Rodrigo Ran, MD as PCP - General (Internal Medicine) Regan Lemming, MD as PCP - Electrophysiology (Cardiology) Chilton Si, MD as PCP - Cardiology (Cardiology)  This Provider for this visit: Treatment Team:  Attending Provider: Kalman Shan, MD    08/27/2022 -   Chief Complaint  Patient presents with   Follow-up    Follow up for IPF. Pt states he has felt stable since last visit. Pt states he is back on his full dose of pirfenidone now and is tolerating it well. Pt is also on Spirivia daily and no issues noted with that and albuterol as needed. Pt states he has not felt any issues with his breathing since last visit Pt also had PFT on 12/13    HPI CONRAD ZAJKOWSKI 78 y.o. -returns for standard of care visit.  In this visit he tells me that the back spasm he had he went to physical therapy and they found out that he had a little bit  of right shoulder droop from wearing his portable oxygen this then created contralateral stretch.  Then after intense physical therapy is better.   He feels this is also helped his lung function.  At last visit he was also having diarrhea.  We stopped the pirfenidone but the diarrhea persisted.  We did extensive stool studies and it was normal.  I referred him to Dr. Jeani Hawking, GI and right before he saw Dr. Elnoria Howard he was actually taking Xifaxan which helped resolve the diarrhea.  He was on pirfenidone holiday from early October 2023 through early November 2023 and he has slowly escalated his pirfenidone back up to the full dose.  Initially he had some diarrhea upon starting pirfenidone but now he is tolerating it well.  He is on full dose.  Through all this since September he is lost another 4 pounds of weight.  He feels that when he was off his pirfenidone his weight loss has stabilized.  Currently the BMI is 24.4 and he is at ideal body weight.  He is also exercising aggressively and he attributes some of the weight loss because of this.  He keeps his oxygen levels greater than 88%.  He has a new year pulse oximeter that he allows him to continuously track his pulse ox.  He is up-to-date with his respiratory vaccines.  He is driving to [place] tomorrow to be with his daughter who is visiting him from [place] for Christmas.  He is still interested in research protocols.  He disqualified from a previous research study because of low T1/FVC ratio screening.  Currently standard of care rate she was fine and DLCO is also stable.  He wants to see if he will rescreen.  He is also interested in the study run by [employer] at another site.  He is going to send me some information to read about that particular product.    PFT  OV 12/20/2022  Subjective:  Patient ID: Reginald Davis, male , DOB: 29-Jan-1946 , age 57 y.o. , MRN: [id number] , ADDRESS: [address] [place] [address] PCP Rodrigo Ran, MD Patient Care Team: Rodrigo Ran, MD as PCP - General (Internal Medicine) Regan Lemming, MD as PCP - Electrophysiology  (Cardiology) Chilton Si, MD as PCP - Cardiology (Cardiology)  This Provider for this visit: Treatment Team:  Attending Provider: Kalman Shan, MD  12/20/2022 -   Chief Complaint  Patient presents with   Follow-up    Cleda Daub f/u,      HPI Reginald Davis 77 y.o. -returns for follow-up.  Is a 72-month follow-up.  He believes he is overall stable.  But he is worried that he is desaturating a little bit more easily.  He states that 1 time when he walked downhill after walking the dog his pulse ox dropped.  Sometimes at night he is now needing 3 L nasal cannula to avoid transient desaturations into 89%.  He is also noticed some desaturations when he walks.  This is through the EAR pulse ox oximeter probe..  However objectively there is no evidence that he is getting worse.  His symptom scores remained stable [he believes he might be under perceiving his symptoms].  His pulmonary function test also is stable [see below].  His walking desaturation test is also stable along with a 6-minute walk test at [facility].  We took a shared decision making for him to do and an exercise distance to desaturation at time to desaturation test on  his treadmill at the same particular pace and the same incline and measure this every 2 weeks every 4 weeks is a good objective way to monitor if he is desaturating and getting worse or not.  He is going to do that.     Meanwhile he continues to be interested in research protocols.  He is registered with [facility] and is looking at a protocol with [employer].  He had screen failed with [place] because of FEV1 FVC ratio being low.  He says at the location his PFT ratio was normal and he might screen passed.  He did show his liver function test from that visit and it was normal.  This was in the first week of April 2024.  He also showed a CT report.  There is saying there is early evidence of honeycombing.  However there is no comparison.  I did indicate to him  that the report could be inaccurate but also could mean that his IPF is evolving and he is getting honeycombing now.  Regardless his symptoms and pulmonary function test and walk test are stable.  He visited Dr. Karie Mainland 11/02/2022 at [facility] lung transplant clinic.  For the disease was going very slow progression.  And he might not need a lung transplant given his age and slow progression.  He showed a letter dated 11/14/2022 where at this point in time they are putting a pause on his transplant evaluation  He plans to travel to the [place] end of May 2024: He he wants to take emergency pack of Paxlovid, doxycycline and prednisone.  We will do this for him.    OV 04/22/2023  Subjective:  Patient ID: Reginald Davis, male , DOB: 1946/05/11 , age 89 y.o. , MRN: [id number] , ADDRESS: [address] [place] [address] PCP Rodrigo Ran, MD Patient Care Team: Rodrigo Ran, MD as PCP - General (Internal Medicine) Regan Lemming, MD as PCP - Electrophysiology (Cardiology) Chilton Si, MD as PCP - Cardiology (Cardiology)  This Provider for this visit: Treatment Team:  Attending Provider: Kalman Shan, MD   04/22/2023 -   Chief Complaint  Patient presents with   Follow-up    4 months f/up, no complaints     HPI Reginald Davis 78 y.o. -returns for follow-up.  Presents with his wife Rayfield Citizen.  She is an independent historian.  After the last visit the trip to [place] got canceled because Rayfield Citizen developed non-COVID respiratory viral infection and was very congested.  This then reduce his exercise.  He was also at the beach last week with his family.  His daughter from [place] and his son-in-law visited them.  Also his daughter from near [place] visited them.  He said because of all this he has gained some weight but he is trying to lose it.  His shortness of breath itself is stable.  At home he monitors his distance to desaturation.  On his treadmill with a speed of 2.6 on a  incline of 0 he desaturates on 1 minute.  He feels this is stable.  However he is concerned that his 6-minute walk distance has dropped.  In February it was [facility]28 feet at [facility].  Currently in June 2024 is 1535 feet.  The latter was done at [facility].  He says the hallway was crowded and there were people there and was not a straight line.  Nevertheless his pulse ox did go down to 87%.  He showed me  those results.  He also showed his pulmonary function test from [facility] as part of the research protocol.  His FVC stable but his DLCO might have declined.  Overall this conflicting data about his decline.  His symptom score and assistance of desaturation at home are stable but his DLCO seems a little worse and then his 6-minute walk test is also a little bit worse but these are all done at different institutions in different set up.  His current therapy is generic pirfenidone along with the [employer] study drug.  He believes he might be getting placebo although he is blinded because he does not have any side effects.   -His FVC in April 2024 3.6 at the study site.  In July 2024 was 3.78.  His DLCO in June 2024 was 12.28.  I reviewed external records for this that he showed me.'   OV 07/23/2023  Subjective:  Patient ID: Reginald Davis, male , DOB: [birth year] , age 59 y.o. , MRN: [id number] , ADDRESS: [address] [place] [address] PCP Rodrigo Ran, MD Patient Care Team: Rodrigo Ran, MD as PCP - General (Internal Medicine) Regan Lemming, MD as PCP - Electrophysiology (Cardiology) Chilton Si, MD as PCP - Cardiology (Cardiology)  This Provider for this visit: Treatment Team:  Attending Provider: Kalman Shan, MD    07/23/2023 -   Chief Complaint  Patient presents with   Follow-up    Breathing is stable today. He has days that he feels like his breathlessness is getting worse. He has occ cough- prod with gray sputum.      HPI CAI ANFINSON 78  y.o. -presents for follow-up with his wife.  He is currently on pirfenidone and tolerating it well overall..  Main issue is that he is concerned that his pulmonary fibrosis/IPF is slowly getting worse.  He showed spirometry data from the recent facility in [place].  There is a decline in FVC.  He has not had a DLCO of the study since April 2024.  He is also not had a repeat CT chest of 6-minute walk test.  These are pending at some point in time later the study.  He is taking study drug.  He is tolerating this fine.  His symptom score shows continued stability.  In addition his sit/stand exercise hypoxemia test appears adequate.  He did show exercise hypoxemia test at home but 5 minutes into the treadmill he desaturated to 89% this was on 07/18/2023.  There is quite a bit of variation.  Sometimes he is going to 1/2 minutes before desaturate and then 1 to have any personal breathing it took him 20 minutes to desaturate to 89%.  Because of his concerns of shortness of breath he did have a cardiac stress test in October 2024.  I reviewed that report.  He is got a good ejection fraction and it is basically normal.   - Other issues  - he continues to have fashion related issues particularly his low back and is working with physical therapy for that which is helping him. - Therapeutic monitoring.  Safety labs and research are normal according to his history and the sheet that he showed me.  He also had blood work with Dr. Waynard Edwards on 07/19/2023.  He brought the review of charts with him.  His LFT is normal.  And to assess creatinine. -He has mild associated emphysema.  He is on Spiriva.  He wants an alternative.  I restarted him to find  out which alternative might be cheaper through his insurance.  This would be  INcruse or Tudorza     OV 11/04/2023  Subjective:  Patient ID: Reginald Davis, male , DOB: 1945/10/23 , age 14 y.o. , MRN: [id number] , ADDRESS: [address] [place] [address] PCP Rodrigo Ran, MD Patient Care Team: Rodrigo Ran, MD as PCP - General (Internal Medicine) Regan Lemming, MD as PCP - Electrophysiology (Cardiology) Chilton Si, MD as PCP - Cardiology (Cardiology)  This Provider for this visit: Treatment Team:  Attending Provider: Kalman Shan, MD    IPF dx givien 08/26/20 based on "probable UIP on CT + negative srology, +  age > 7, + white + male + prior smoker + heart burn/GERD hx-> this is "IPF"  -Last CT scan of the chest December 2022  - Esbriet start date -> Oct 26, 2020   - Pulm rehab Mar 2022  - Clinical trial education - Mar 2022 (Pliant D ICF given)     - Pliant study participant - onsent 02Jun2022 and was randomized 22Jun2022 -0> completed early (feb) 2023   - [facility] SQ Vixarelimab study - Screen failed due to low ratio in fev1/fvc   -He is on [employer] study drug through [facility].  - PFF concept introduction - May 2022   -National conference November 2023     Associated mild emphysema present -started Spiriva  Normal Echo May 2021 and summer 2023  WEight loss  April 2022: 171#  June 2023: 162#  SEpt 2023: 158# - bmi 25.14 Aug 2022: 154#- bmi 24.13 December 2022-155 pounds  Aug 2024: 164#  Feb 2025: 169#    Esbriet/Pirfenidone requires intensive drug monitoring due to high concerns for Adverse effects of , including  Drug Induced Liver Injury, significant GI side effects that include but not limited to Diarrhea, Nausea, Vomiting,  and other system side effects that include Fatigue, headaches, weight loss and other side effects such as skin rash. These will be monitored with  blood work such as LFT initially once a month for 6 months and then quarterly   11/04/2023 -   Chief Complaint  Patient presents with   Follow-up     HPI HOSIE SHARMAN 78 y.o. -presents for followup with wife Berman Grainger. LAst visit Nov 2024. HE coninues on esbriet. He continues on BMS study drug v Placebo at  [facility]. In summer 2025 he will roll over to OLE. There might be a CT chest at at that time (he will find out). HE is tolerating the study drug well. Tolerating esbriet as well. HOwever, he feels he is declinng. HIs PFTs show that but DLCO decline > FVC. He says that when he does TMT at home at 2. on RA at 0% incline -> he desaturates at 2 minues . Previously was much better.. Also feels he needs o2 during talking HE has dropped o2 when he watches TV. When he goes to mail box and back he frequently desaturates Night he uses 3L [place] and his apple iOX does not desaturate. Last echo summer 2023  Other issues  - LDH at research ws slighty high but now better - K 5.8 and then 5.6 few days ago. -> I agreed to recheck - PSA was high at with PCP 4.65 in May 2025 and 5.5 in Nov 2024 - wants recheck  - VIt D preivsly low -> on replacement through me. No recheck so far   SYMPTOM  SCALE - ILD 09/29/2020  11/10/2020 Now on esbriet sinc 10/26/20 12/21/2020 esbriet + 171# 08/22/2021 Esbriet and Pliant study drug , 173# 11/28/2021 Esbriet, off plaint study, 2L [place] night, 3L [place] exercise, 163# intential weight loss 03/02/2022 162# night o2 , ex o2 subjectively 06/07/2022 Uses exercise oxygen 08/27/2022 154#  - diarrhea sept/oc and took esbriet holiday early oc-nov 2023 and needex xifaxan. BAck on esbrie at time of this visit 12/20/2022  04/22/2023 Generic pirfenidone _ BMS study drug vial [facility] 07/23/2023 Generic esbreit + BMS study drug 11/04/2023 Genreic esbriet + BMS study drug  O2 use ra  ra a t rest 2-3L with exercise pulsed 2L [place] at night, 3L pulsed during exerise and travels          Shortness of Breath 0 -> 5 scale with 5 being worst (score 6 If unable to do)             At rest 0.5 0 0.5 1 1 1  0.5 1 1  0/5 1 1   Simple tasks - showers, clothes change, eating, shaving 1 00 0 0 1 1 1 1 1 0  1 1  Household (dishes, doing bed, laundry) 1 0 0.5 1 2  1.5 1 2 2 1 2 2   Shopping 1 0 0.5 1 2  1.5 1 1.5 1 1 1 1    Walking level at own pace 1.5 1 1  0 2 2 2 2 2 1 1 2   Walking up Stairs *2.25 2 2 2 3 3 2  2.5 3 1.5 2 2   Total (30-36) Dyspnea Score 7.25 3 6 5 11 10  7.5 10 10 5 8 9   How bad is your cough? Improved with ppi 1 1 ` 1 1 0.5 1 1 1 1 1   How bad is your fatigue 0 0 0.5 ` 1 1 1 1 1  0 1 1  How bad is nausea 0 0 0 0 0 0 0 0 0 0  0 0  How bad is vomiting?  0 00 0 0 0 0 0 0 1 0  0 0  How bad is diarrhea? 0.5 coffee 0 0.25 1 1  0 2.5 1 0 0 0 0  How bad is anxiety? 0.5 0 0.5 1 0 0 0.5 0 0 0 00 0  How bad is depression 0 0 0 0 0 0 0 0  0 0 1   Walk test Simple office walk 185 feet x  3 laps goal with forehead probe 08/09/2020  [facility] 89% and HR111. 1296 fet. 0L o2 needed. Pl JR 111/ done 10/24/20  08/22/2021 - at [facility] 05/[facility]/21 - walked 1671 feet wihout o2. This is an imprement  03/02/2022  163# -11/01/2021 [facility] the 6-minute walk and completed 174 6 feet.  Lowest pulse ox was 91%.  Heart rate was 107.  Did 15 labs.  Did not need oxygen. Aug 2023 at [facility] feet . Did not need o2 08/27/2022  12/20/2022 11/01/2022 6-minute walk test at [facility] 1728 feet/116 % predicted.  Lowest pulse ox was 91% on room air.  Maximum heart rate was 111 February 26, 2023 research at [facility] chest 6-minute walk test -> 468 m [2956 feet].  Started at 92% ended up at 87%.  This was not a straight hallway and may need zigzag 09/17/2023 [facility]: [address], lowest pulse ox 85% 07/23/2023   O2 used ra ra ra ra  ra ra   ra  Number laps completed 3 3 3  3  3 3    Sist and stand x 15  Comments about pace normal nL          Resting Pulse Ox/HR 96% and 80/min 96% and 90.min 99% and 60 97% and HR 62  100% and HR 68 95% and HR 67   98% and HR 61  Final Pulse Ox/HR 94% and 90/min 88% and 97/min 95% and 94 94% and HR 84  97% and HR 106 92% and HR 72   93% and HR 70  Desaturated </= 88% no yes yno no  no no     Desaturated <= 3% points no Yes, 8 points Yes, 4 Yes, 3 points  Yes, 3 points Yes 3 points      Got Tachycardic >/= 90/min yes yes yes no  yes yes     Symptoms at end of test dyspnea Some dyspnea Mild dyspea none  none no     Miscellaneous comments x   Bris pad  stable          PFT     Latest Ref Rng & Units 10/23/23 [facility]  Research 09/17/2023 [facility] 06/19/23 [facility] Research 02/26/23  Research at [facility] 12/20/2022   12:19 PM 08/22/2022    8:53 AM 03/02/2022    8:49 AM 11/25/2020    8:52 AM 07/12/2020   11:53 AM  PFT Results  FVC-Pre L 3.45 3.58 3.35 3.92 3.73  3.94  3.89  3.43  3.36   FVC-Predicted Pre % 98.5%    105  111  108  94  88   FVC-Post L         3.41   FVC-Predicted Post %         90   Pre FEV1/FVC % %     73  70  71  74  72   Post FEV1/FCV % %         74   FEV1-Pre L     2.73  2.76  2.75  2.53  2.41   FEV1-Predicted Pre %     107  109  106  97  88   FEV1-Post L         2.51   DLCO uncorrected ml/min/mmHg     15.84  16.25  14.76  13.56  16.88   DLCO UNC% %     71  73  66  60  73   DLCO corrected ml/min/mmHg x 11.92 x x 16.03  16.25  14.84  13.45  16.88   DLCO COR %Predicted %     72  73  67  60  73   DLVA Predicted %     74  71  66  66  80   TLC L         5.39   TLC % Predicted %         83   RV % Predicted %         65        LAB RESULTS last 96 hours No results found.       has a past medical history of Burn, Coronary artery disease, Diverticul disease small and large intestine, no perforati or abscess, Exertional dyspnea (06/24/2023), GERD (gastroesophageal reflux disease), Hernia, History of nuclear stress test, Hyperlipidemia, Low back pain, Oxygen toxicity, Plantar fasciitis, Retinal tear, Right rotator cuff tear, and Shingles.   reports that he quit smoking about 28 years ago. His smoking use included cigarettes. He  started smoking about 48 years ago. He has a 40 pack-year smoking history. He has never used smokeless tobacco.  Past Surgical History:  Procedure Laterality Date   EYE SURGERY     PACEMAKER IMPLANT N/A 01/04/2021   Procedure:  PACEMAKER IMPLANT;  Surgeon: Regan Lemming, MD;  Location: [facility] INVASIVE CV LAB;  Service: Cardiovascular;  Laterality: N/A;   REFRACTIVE SURGERY     retina repair and retina tear   SHOULDER ARTHROSCOPY     SHOULDER SURGERY     TONSILLECTOMY      No Known Allergies  Immunization History  Administered Date(s) Administered   Fluad Quad(high Dose 65+) 06/07/2022   Influenza Split 09/22/2009, 06/01/2010, 06/07/2011, 05/27/2012, 07/28/2013   Influenza, High Dose Seasonal PF 06/15/2015, 06/05/2016, 06/08/2017, 06/07/2018   Influenza, Quadrivalent, Recombinant, Inj, Pf 06/07/2018, 06/12/2019, 06/22/2020, 06/01/2021, 06/06/2023   Influenza,inj,Quad PF,6+ Mos 06/18/2014   Moderna Covid-19 Vaccine Bivalent Booster 38yrs & up 06/14/2021, 01/05/2022   PFIZER(Purple Top)SARS-COV-2 Vaccination 10/05/2019, 10/27/2019, 06/03/2020   Pfizer(Comirnaty)Fall Seasonal Vaccine 12 years and older 01/04/2023, 05/30/2023   Pneumococcal Conjugate-13 08/12/2013, 06/13/2014   Pneumococcal Polysaccharide-23 11/04/2000, 09/22/2009, 08/16/2010   Respiratory Syncytial Virus Vaccine,Recomb Aduvanted(Arexvy) 05/21/2022   Td 09/10/1998, 09/22/2009, 12/23/2019   Tdap 10/02/2010   Typhoid Inactivated 09/09/2015   Zoster Recombinant(Shingrix) 01/13/2017, 04/26/2017   Zoster, Live 09/16/2007, 09/22/2009    Family History  Problem Relation Age of Onset   Hyperlipidemia Mother    Diabetes Father    Heart disease Father    Coronary artery disease Brother      Current Outpatient Medications:    albuterol (VENTOLIN HFA) 108 (90 Base) MCG/ACT inhaler, Inhale 1-2 puffs into the lungs every 6 (six) hours as needed for wheezing or shortness of breath., Disp: 18 g, Rfl: 1   Alirocumab (PRALUENT) 150 MG/ML SOAJ, Inject 150 mg as directed every 30 (thirty) days. , Disp: , Rfl:    allopurinol (ZYLOPRIM) 100 MG tablet, Take 1 tablet by mouth 2 (two) times daily., Disp: , Rfl:    Alpha-Lipoic Acid 200 MG CAPS, Take 1  capsule by mouth in the morning and at bedtime., Disp: , Rfl:    aspirin 81 MG tablet, Take 81 mg by mouth daily., Disp: , Rfl:    atorvastatin (LIPITOR) 20 MG tablet, Take 10 mg by mouth at bedtime., Disp: , Rfl:    Cholecalciferol (VITAMIN D3) 1.25 MG (50000 UT) CAPS, Take 1 capsule (1.25 mg total) by mouth every 30 (thirty) days., Disp: 12 capsule, Rfl: 11   Coenzyme Q10 300 MG CAPS, Take 1 capsule by mouth every morning., Disp: , Rfl: 0   colchicine 0.6 MG tablet, Take 1 tablet by mouth 2 (two) times daily as needed (gout flare)., Disp: , Rfl:    dorzolamide-timolol (COSOPT) 22.3-6.8 MG/ML ophthalmic solution, Place 1 drop into the right eye 2 (two) times daily., Disp: , Rfl:    doxycycline (VIBRA-TABS) 100 MG tablet, Take 1 tablet (100 mg total) by mouth 2 (two) times daily., Disp: 10 tablet, Rfl: 0   fluorometholone (FML) 0.1 % ophthalmic suspension, Place 1 drop into the right eye daily., Disp: , Rfl:    Guaifenesin 1200 MG TB12, Take 1,200 mg by mouth 2 (two) times daily., Disp: , Rfl:    Loperamide-Simethicone 2-125 MG TABS, Take 1 tablet by mouth as needed. Up to 4 times a day., Disp: , Rfl:    Multiple Vitamins-Minerals (CENTRUM SILVER PO), Take 1 tablet by mouth every evening., Disp: , Rfl:  OXYGEN, Inhale 2 L into the lungs., Disp: , Rfl:    Pirfenidone 801 MG TABS, Take 1 tablet (801 mg total) by mouth with breakfast, with lunch, and with evening meal., Disp: 270 tablet, Rfl: 1   Polyethyl Glycol-Propyl Glycol (SYSTANE) 0.4-0.3 % SOLN, Apply 1 drop to eye daily as needed (dry eyes)., Disp: , Rfl:    predniSONE (DELTASONE) 10 MG tablet, Take 4 tabs for 2 days, then 3 tabs for 2 days, 2 tabs for 2 days, then 1 tab for 2 days, then stop., Disp: 20 tablet, Rfl: 0   tiotropium (SPIRIVA HANDIHALER) 18 MCG inhalation capsule, INHALE 1 CAPSULE VIA HANDIHALER ONCE DAILY AT THE SAME TIME EVERY DAY, Disp: 30 capsule, Rfl: 12   triamcinolone cream (KENALOG) 0.1 %, Apply topically in the morning  and at bedtime., Disp: , Rfl:    valACYclovir (VALTREX) 1000 MG tablet, Take 1 tablet by mouth daily as needed (fever blisters)., Disp: , Rfl:    valsartan (DIOVAN) 320 MG tablet, TAKE 1 TABLET (320 MG TOTAL) BY MOUTH EVERY EVENING., Disp: 90 tablet, Rfl: 3   vitamin C (ASCORBIC ACID) 500 MG tablet, Take 500 mg by mouth every evening., Disp: , Rfl:    XIFAXAN 550 MG TABS tablet, Take 550 mg by mouth 3 (three) times daily. PRN, Disp: , Rfl:       Objective:   Vitals:   11/04/23 1432  BP: (!) 158/77  Pulse: 68  Temp: (!) 97.4 F (36.3 C)  TempSrc: Oral  SpO2: 97%  Weight: 169 lb 12.8 oz (77 kg)  Height: 5\' 6"  (1.676 m)    Estimated body mass index is 27.41 kg/m as calculated from the following:   Height as of this encounter: 5\' 6"  (1.676 m).   Weight as of this encounter: 169 lb 12.8 oz (77 kg).  @WEIGHTCHANGE @  [employer]   11/04/23 1432  Weight: 169 lb 12.8 oz (77 kg)     Physical Exam   General: No distress. Looks well O2 at rest: no but uses it Cane present: no Sitting in wheel chair: no Frail: no Obese: no Neuro: Alert and Oriented x 3. GCS 15. Speech normal Psych: Pleasant Resp:  Barrel Chest - no.  Wheeze - no, Crackles - no v mild, No overt respiratory distress CVS: Normal heart sounds. Murmurs - no Ext: Stigmata of Connective Tissue Disease - no HEENT: Normal upper airway. PEERL +. No post nasal drip        Assessment:       ICD-10-CM   1. IPF (idiopathic pulmonary fibrosis) (HCC)  J84.112 Basic Metabolic Panel (BMET)    B Nat Peptide    Vitamin D 1,25 dihydroxy    ECHOCARDIOGRAM COMPLETE    AMB REFERRAL FOR DME    2. Dyspnea on exertion  R06.09 Basic Metabolic Panel (BMET)    B Nat Peptide    Vitamin D 1,25 dihydroxy    ECHOCARDIOGRAM COMPLETE    AMB REFERRAL FOR DME    3. Medication monitoring encounter  Z51.81 Basic Metabolic Panel (BMET)    B Nat Peptide    Vitamin D 1,25 dihydroxy    ECHOCARDIOGRAM COMPLETE    4. Exercise  hypoxemia  R09.02 Basic Metabolic Panel (BMET)    B Nat Peptide    Vitamin D 1,25 dihydroxy    ECHOCARDIOGRAM COMPLETE    5. Vitamin D deficiency  E55.9 Basic Metabolic Panel (BMET)    B Nat Peptide    Vitamin D 1,25 dihydroxy  ECHOCARDIOGRAM COMPLETE    6. Elevated PSA, less than 10 ng/ml  R97.20 Basic Metabolic Panel (BMET)    B Nat Peptide    Vitamin D 1,25 dihydroxy    ECHOCARDIOGRAM COMPLETE         Plan:     Patient Instructions     ICD-10-CM   1. IPF (idiopathic pulmonary fibrosis) (HCC)  J84.112 Basic Metabolic Panel (BMET)    B Nat Peptide    Vitamin D 1,25 dihydroxy    ECHOCARDIOGRAM COMPLETE    2. Dyspnea on exertion  R06.09 Basic Metabolic Panel (BMET)    B Nat Peptide    Vitamin D 1,25 dihydroxy    ECHOCARDIOGRAM COMPLETE    3. Medication monitoring encounter  Z51.81 Basic Metabolic Panel (BMET)    B Nat Peptide    Vitamin D 1,25 dihydroxy    ECHOCARDIOGRAM COMPLETE    4. Exercise hypoxemia  R09.02 Basic Metabolic Panel (BMET)    B Nat Peptide    Vitamin D 1,25 dihydroxy    ECHOCARDIOGRAM COMPLETE    5. Vitamin D deficiency  E55.9 Basic Metabolic Panel (BMET)    B Nat Peptide    Vitamin D 1,25 dihydroxy    ECHOCARDIOGRAM COMPLETE    6. Elevated PSA, less than 10 ng/ml  R97.20 Basic Metabolic Panel (BMET)    B Nat Peptide    Vitamin D 1,25 dihydroxy    ECHOCARDIOGRAM COMPLETE      #IPF: This is slowly progressive but we need to rule out new development of pulmonary hypertension. You are on max therapy On generic pirfenidone + BMS study drug ; tolerating both well without side effect  #Recent elevations in potassium  -noted in the study protocol at [facility] chest.  We will need to recheck.  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] -Check BNP and echocardiogram rule out pulmonary  hypertension  -Based on the results might have to proceed to right heart catheterization -Recheck potassium through BMET [elevated at [place] chest study protocol - CHck PSA -Otherwise safety monitoring through recent labs at [facility] chest -Continue portable oxygen with exertion - Continue nighttime oxygen [we have sent an order for [facility] to have them supply nighttime oxygen]  - continue daily exercise - keep pulse ox  >-= 88%  - once every few weeks - measure time or distance to desaturation on room air by walking at same pace and incline at treadmill - wil see if new drug is approved next few months     Follow-up - At 3-month standard of care visit with Dr. Marchelle Gearing 30 minutes-  -Symptom questionnaire and walk test at follow-up   FOLLOWUP Return in about 8 weeks (around 12/30/2023) for 30 min visit, with Dr Marchelle Gearing.  ( Level 05 visit E&M 2024: Estb >= 40 min   in  visit type: on-site physical face to visit  in total care time and counseling or/and coordination of care by this undersigned MD - Dr Kalman Shan. This includes one or more of the following on this same day 11/04/2023: pre-charting, chart review, note writing, documentation discussion of test results, diagnostic or treatment recommendations, prognosis, risks and benefits of management options, instructions, education, compliance or risk-factor reduction. It excludes time spent by the CMA or office staff in the care of the patient. Actual time 45 min)  SIGNATURE    Dr. Kalman Shan, M.D., F.C.C.P,  Pulmonary and Critical Care Medicine Staff Physician, [facility] Center Director - Interstitial Lung Disease  Program  Pulmonary Fibrosis [facility] Network at [facility] [place], [place], [address]  Pager: [phone], If no answer or between  15:00h - 7:00h: call [phone] Telephone: [phone]  3:15 PM 11/04/2023

## 2023-11-04 NOTE — Patient Instructions (Addendum)
 ICD-10-CM   1. IPF (idiopathic pulmonary fibrosis) (HCC)  J84.112 Basic Metabolic Panel (BMET)    B Nat Peptide    Vitamin D 1,25 dihydroxy    ECHOCARDIOGRAM COMPLETE    2. Dyspnea on exertion  R06.09 Basic Metabolic Panel (BMET)    B Nat Peptide    Vitamin D 1,25 dihydroxy    ECHOCARDIOGRAM COMPLETE    3. Medication monitoring encounter  Z51.81 Basic Metabolic Panel (BMET)    B Nat Peptide    Vitamin D 1,25 dihydroxy    ECHOCARDIOGRAM COMPLETE    4. Exercise hypoxemia  R09.02 Basic Metabolic Panel (BMET)    B Nat Peptide    Vitamin D 1,25 dihydroxy    ECHOCARDIOGRAM COMPLETE    5. Vitamin D deficiency  E55.9 Basic Metabolic Panel (BMET)    B Nat Peptide    Vitamin D 1,25 dihydroxy    ECHOCARDIOGRAM COMPLETE    6. Elevated PSA, less than 10 ng/ml  R97.20 Basic Metabolic Panel (BMET)    B Nat Peptide    Vitamin D 1,25 dihydroxy    ECHOCARDIOGRAM COMPLETE      #IPF: This is slowly progressive but we need to rule out new development of pulmonary hypertension. You are on max therapy On generic pirfenidone + BMS study drug ; tolerating both well without side effect  #Recent elevations in potassium  -noted in the study protocol at [facility] chest.  We will need to recheck.  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] -Check BNP and echocardiogram rule out pulmonary hypertension  -Based on the results might have to proceed to right heart catheterization -Recheck potassium through BMET [elevated at [place] chest study protocol - CHck PSA -Otherwise safety monitoring through recent labs at [facility] chest -Continue portable oxygen with exertion - Continue nighttime oxygen [we have sent an order for [facility] to have them supply nighttime oxygen]  - continue daily exercise - keep pulse ox  >-= 88%  - once every few weeks - measure  time or distance to desaturation on room air by walking at same pace and incline at treadmill - wil see if new drug is approved next few months     Follow-up - At 56-month standard of care visit with Dr. Marchelle Gearing 30 minutes-  -Symptom questionnaire and walk test at follow-up

## 2023-11-05 ENCOUNTER — Encounter: Payer: Self-pay | Admitting: Internal Medicine

## 2023-11-05 LAB — BASIC METABOLIC PANEL
BUN: 18 mg/dL (ref 6–23)
CO2: 26 meq/L (ref 19–32)
Calcium: 8.9 mg/dL (ref 8.4–10.5)
Chloride: 105 meq/L (ref 96–112)
Creatinine, Ser: 0.85 mg/dL (ref 0.40–1.50)
GFR: 83.87 mL/min (ref >60.00)
Glucose, Bld: 94 mg/dL (ref 70–99)
Potassium: 4.6 meq/L (ref 3.5–5.1)
Sodium: 139 meq/L (ref 135–145)

## 2023-11-05 LAB — BRAIN NATRIURETIC PEPTIDE: Pro B Natriuretic peptide (BNP): 22 pg/mL (ref 0.0–100.0)

## 2023-11-05 NOTE — Telephone Encounter (Signed)
 No PSA test in lab orders.   Please advise, thank you!

## 2023-11-06 NOTE — Addendum Note (Signed)
 Addended by: Christen Butter on: 11/06/2023 03:04 PM   Modules accepted: Orders

## 2023-11-07 LAB — PSA: PSA: 7.19 ng/mL — ABNORMAL HIGH (ref 0.10–4.00)

## 2023-11-08 ENCOUNTER — Telehealth: Payer: Self-pay | Admitting: Internal Medicine

## 2023-11-08 ENCOUNTER — Encounter: Payer: Self-pay | Admitting: Internal Medicine

## 2023-11-08 DIAGNOSIS — J84112 Idiopathic pulmonary fibrosis: Secondary | ICD-10-CM

## 2023-11-08 DIAGNOSIS — G4734 Idiopathic sleep related nonobstructive alveolar hypoventilation: Secondary | ICD-10-CM

## 2023-11-08 NOTE — Telephone Encounter (Signed)
 Ardeth Sportsman; [place], Tomie China; Angus Seller, [place] Received, We will need ONO results from within the last 30days to qualify the pt. We will also need Office notes mentioning the need for the O2 at night and an order using the O2 template please.

## 2023-11-08 NOTE — Progress Notes (Signed)
 Reginald Davis  your PSA is > 7

## 2023-11-09 LAB — VITAMIN D 1,25 DIHYDROXY
Vitamin D 1, 25 (OH)2 Total: 25 pg/mL (ref 18–72)
Vitamin D2 1, 25 (OH)2: <8 pg/mL
Vitamin D3 1, 25 (OH)2: 25 pg/mL

## 2023-11-12 NOTE — Telephone Encounter (Signed)
 Dr. Marchelle Gearing, [employer] needs a recent ONO (within the past 30 days) in order to provide night time oxygen.  I do not see an.ONO since 2021.  Do you want to have another ONO done?  Please advise.  Thank you.

## 2023-11-13 ENCOUNTER — Encounter: Payer: Self-pay | Admitting: Internal Medicine

## 2023-11-13 NOTE — Telephone Encounter (Signed)
 When I met him I got the impression from him that [employer] did NOT need a onO and just an order was suffficent. I found this odd but went along. Clearly they need a ONO test on room air again Ordered please check if correct (my order)

## 2023-11-14 ENCOUNTER — Telehealth: Payer: Self-pay | Admitting: Internal Medicine

## 2023-11-14 DIAGNOSIS — H40051 Ocular hypertension, right eye: Secondary | ICD-10-CM | POA: Diagnosis not present

## 2023-11-14 DIAGNOSIS — H40003 Preglaucoma, unspecified, bilateral: Secondary | ICD-10-CM | POA: Diagnosis not present

## 2023-11-14 DIAGNOSIS — J84112 Idiopathic pulmonary fibrosis: Secondary | ICD-10-CM

## 2023-11-14 DIAGNOSIS — G4734 Idiopathic sleep related nonobstructive alveolar hypoventilation: Secondary | ICD-10-CM

## 2023-11-14 NOTE — Telephone Encounter (Signed)
 The O2 order is now on hold from [employer] they have to have the ONO results back first before filling the O2. The pt's ONO appt is on the 26th.

## 2023-11-14 NOTE — Telephone Encounter (Signed)
 I called and spoke with the pt and notified of response from Dr Marchelle Gearing  He verbalized understanding  He states that someone from [employer] already called him to schedule ONO  Nothing further needed

## 2023-11-14 NOTE — Telephone Encounter (Signed)
 FYI Dr Marchelle Gearing. NFN

## 2023-11-14 NOTE — Telephone Encounter (Signed)
 Reginald Davis emailed me about the safety of doing overnight pulse oximetry on room air.  Please ask [facility] if they can just do it on his baseline 2 L nasal cannula and if he would still qualify based on those results or if they absolutely needed on room air?  And please let me know what they say

## 2023-11-15 ENCOUNTER — Other Ambulatory Visit: Payer: Self-pay | Admitting: Urology

## 2023-11-15 DIAGNOSIS — N400 Enlarged prostate without lower urinary tract symptoms: Secondary | ICD-10-CM | POA: Diagnosis not present

## 2023-11-15 DIAGNOSIS — R972 Elevated prostate specific antigen [PSA]: Secondary | ICD-10-CM

## 2023-11-15 NOTE — Telephone Encounter (Signed)
 Okay I have put in a new order for overnight pulse oximetry 2 L nasal cannula at night.

## 2023-11-20 NOTE — Progress Notes (Signed)
 Remote pacemaker transmission.

## 2023-11-22 ENCOUNTER — Other Ambulatory Visit (HOSPITAL_COMMUNITY): Payer: Self-pay | Admitting: Urology

## 2023-11-22 DIAGNOSIS — R972 Elevated prostate specific antigen [PSA]: Secondary | ICD-10-CM

## 2023-11-23 DIAGNOSIS — R109 Unspecified abdominal pain: Secondary | ICD-10-CM | POA: Diagnosis not present

## 2023-11-23 DIAGNOSIS — M545 Low back pain, unspecified: Secondary | ICD-10-CM | POA: Diagnosis not present

## 2023-11-28 ENCOUNTER — Ambulatory Visit (INDEPENDENT_AMBULATORY_CARE_PROVIDER_SITE_OTHER): Payer: Medicare Other

## 2023-11-28 DIAGNOSIS — R0902 Hypoxemia: Secondary | ICD-10-CM | POA: Diagnosis not present

## 2023-11-28 DIAGNOSIS — R0609 Other forms of dyspnea: Secondary | ICD-10-CM | POA: Diagnosis not present

## 2023-11-28 DIAGNOSIS — J84112 Idiopathic pulmonary fibrosis: Secondary | ICD-10-CM

## 2023-11-28 DIAGNOSIS — R972 Elevated prostate specific antigen [PSA]: Secondary | ICD-10-CM | POA: Diagnosis not present

## 2023-11-28 DIAGNOSIS — E559 Vitamin D deficiency, unspecified: Secondary | ICD-10-CM

## 2023-11-28 DIAGNOSIS — Z5181 Encounter for therapeutic drug level monitoring: Secondary | ICD-10-CM

## 2023-11-28 LAB — ECHOCARDIOGRAM COMPLETE
Area-P 1/2: 3.37 cm2
S' Lateral: 1.9 cm

## 2023-12-04 DIAGNOSIS — H401111 Primary open-angle glaucoma, right eye, mild stage: Secondary | ICD-10-CM | POA: Diagnosis not present

## 2023-12-04 DIAGNOSIS — H11421 Conjunctival edema, right eye: Secondary | ICD-10-CM | POA: Diagnosis not present

## 2023-12-04 DIAGNOSIS — H53143 Visual discomfort, bilateral: Secondary | ICD-10-CM | POA: Diagnosis not present

## 2023-12-04 DIAGNOSIS — H1711 Central corneal opacity, right eye: Secondary | ICD-10-CM | POA: Diagnosis not present

## 2023-12-10 NOTE — Telephone Encounter (Signed)
   Mild stiff heart muscule but otherwise ok echo   IMPRESSIONS     1. Left ventricular ejection fraction, by estimation, is 55 to 60%. The  left ventricle has normal function. The left ventricle has no regional  wall motion abnormalities. Left ventricular diastolic parameters are  consistent with Grade I diastolic  dysfunction (impaired relaxation).   2. Right ventricular systolic function is normal. The right ventricular  size is normal.   3. The mitral valve is normal in structure. No evidence of mitral valve  regurgitation. No evidence of mitral stenosis.   4. The aortic valve is tricuspid. Aortic valve regurgitation is not  visualized. Aortic valve sclerosis is present, with no evidence of aortic  valve stenosis.   5. The inferior vena cava is normal in size with greater than 50%  respiratory variability, suggesting right atrial pressure of 3 mmHg.

## 2023-12-10 NOTE — Telephone Encounter (Signed)
Spoke with pt and notified of results per Dr. Ramaswamy. Pt verbalized understanding and denied any questions.  

## 2023-12-11 ENCOUNTER — Other Ambulatory Visit (HOSPITAL_BASED_OUTPATIENT_CLINIC_OR_DEPARTMENT_OTHER): Payer: Self-pay

## 2023-12-11 DIAGNOSIS — Z23 Encounter for immunization: Secondary | ICD-10-CM | POA: Diagnosis not present

## 2023-12-11 MED ORDER — COVID-19 MRNA VAC-TRIS(PFIZER) 30 MCG/0.3ML IM SUSY
0.3000 mL | PREFILLED_SYRINGE | Freq: Once | INTRAMUSCULAR | 0 refills | Status: AC
  Filled 2023-12-11: qty 0.3, 1d supply, fill #0

## 2023-12-21 DIAGNOSIS — R109 Unspecified abdominal pain: Secondary | ICD-10-CM | POA: Diagnosis not present

## 2023-12-21 DIAGNOSIS — M545 Low back pain, unspecified: Secondary | ICD-10-CM | POA: Diagnosis not present

## 2023-12-31 ENCOUNTER — Other Ambulatory Visit (HOSPITAL_COMMUNITY)

## 2023-12-31 DIAGNOSIS — H35103 Retinopathy of prematurity, unspecified, bilateral: Secondary | ICD-10-CM | POA: Diagnosis not present

## 2023-12-31 DIAGNOSIS — H33312 Horseshoe tear of retina without detachment, left eye: Secondary | ICD-10-CM | POA: Diagnosis not present

## 2023-12-31 DIAGNOSIS — H35372 Puckering of macula, left eye: Secondary | ICD-10-CM | POA: Diagnosis not present

## 2023-12-31 DIAGNOSIS — H40051 Ocular hypertension, right eye: Secondary | ICD-10-CM | POA: Diagnosis not present

## 2024-01-01 NOTE — CV Procedure (Signed)
  Device system confirmed to be MRI conditional, with implant date > 6 weeks ago, and no evidence of abandoned or epicardial leads in review of most recent CXR  Device last cleared by EP Provider: Michaelle Adolphus 01/01/24  Clearance is good through for 1 year as long as parameters remain stable at time of check. If pt undergoes a cardiac device procedure during that time, they should be re-cleared.   Tachy-therapies to be programmed off if applicable with device back to pre-MRI settings after completion of exam.  [employer] will be present for programming for the MRI.   Arlys Lamer, RT  01/01/2024 2:04 PM

## 2024-01-02 ENCOUNTER — Encounter: Payer: Self-pay | Admitting: Internal Medicine

## 2024-01-02 ENCOUNTER — Other Ambulatory Visit: Payer: Self-pay | Admitting: Internal Medicine

## 2024-01-03 MED ORDER — NIRMATRELVIR/RITONAVIR (PAXLOVID)TABLET: 3.0000 | ORAL_TABLET | Freq: Two times a day (BID) | ORAL | 0 refills | Status: AC

## 2024-01-03 MED ORDER — DOXYCYCLINE HYCLATE 100 MG PO TABS: 100.0000 mg | ORAL_TABLET | Freq: Two times a day (BID) | ORAL | 0 refills | Status: DC

## 2024-01-03 MED ORDER — PREDNISONE 10 MG PO TABS: ORAL_TABLET | ORAL | 0 refills | Status: DC

## 2024-01-03 NOTE — Telephone Encounter (Signed)
 Gurney Lefort  Plse send all 3 per recent OV  Thanks    SIGNATURE    Dr. Maire Scot, M.D., F.C.C.P,  Pulmonary and Critical Care Medicine Staff Physician, [facility] Center Director - Interstitial Lung Disease  Program  Pulmonary Fibrosis [facility] Network at [facility] [place], [place], [address]   Pager: [phone], If no answer  -> Check AMION or Try [phone] Telephone (clinical office): [phone] Telephone (research): [phone]  8:40 AM 01/03/2024

## 2024-01-07 ENCOUNTER — Telehealth: Payer: Self-pay | Admitting: Cardiology

## 2024-01-07 ENCOUNTER — Ambulatory Visit: Payer: Medicare Other | Admitting: Internal Medicine

## 2024-01-07 ENCOUNTER — Ambulatory Visit (HOSPITAL_COMMUNITY)
Admission: RE | Admit: 2024-01-07 | Discharge: 2024-01-07 | Disposition: A | Discharge status: Home / Self Care | Source: Ambulatory Visit | Attending: Urology | Admitting: Urology

## 2024-01-07 DIAGNOSIS — R972 Elevated prostate specific antigen [PSA]: Secondary | ICD-10-CM | POA: Diagnosis not present

## 2024-01-07 DIAGNOSIS — N4289 Other specified disorders of prostate: Secondary | ICD-10-CM | POA: Diagnosis not present

## 2024-01-07 DIAGNOSIS — N400 Enlarged prostate without lower urinary tract symptoms: Secondary | ICD-10-CM | POA: Diagnosis not present

## 2024-01-07 DIAGNOSIS — K5730 Diverticulosis of large intestine without perforation or abscess without bleeding: Secondary | ICD-10-CM | POA: Diagnosis not present

## 2024-01-07 MED ORDER — GADOBUTROL 1 MMOL/ML IV SOLN
7.0000 mL | Freq: Once | INTRAVENOUS | Status: AC | PRN
  Administered 2024-01-07: 7 mL via INTRAVENOUS

## 2024-01-07 NOTE — Telephone Encounter (Signed)
 Spoke with pt and have gotten his remote transmission rescheduled for [place]

## 2024-01-07 NOTE — Telephone Encounter (Signed)
  1. Has your device fired? No   2. Is you device beeping? No   3. Are you experiencing draining or swelling at device site? No   4. Are you calling to see if we received your device transmission? Pt is schedule to have remote transmission on 05/5 but pt will be on the plane so he wants to see if it can be done a different day  5. Have you passed out? no   Please route to Device Clinic Pool

## 2024-01-13 ENCOUNTER — Ambulatory Visit: Payer: Medicare Other

## 2024-02-04 ENCOUNTER — Ambulatory Visit (INDEPENDENT_AMBULATORY_CARE_PROVIDER_SITE_OTHER)

## 2024-02-04 DIAGNOSIS — N429 Disorder of prostate, unspecified: Secondary | ICD-10-CM | POA: Diagnosis not present

## 2024-02-04 DIAGNOSIS — R972 Elevated prostate specific antigen [PSA]: Secondary | ICD-10-CM | POA: Diagnosis not present

## 2024-02-04 DIAGNOSIS — I442 Atrioventricular block, complete: Secondary | ICD-10-CM

## 2024-02-05 LAB — CUP PACEART REMOTE DEVICE CHECK
Battery Remaining Longevity: 94 mo
Battery Remaining Percentage: 75 %
Battery Voltage: 3.01 V
Brady Statistic AP VP Percent: 1 %
Brady Statistic AP VS Percent: 1 %
Brady Statistic AS VP Percent: 83 %
Brady Statistic AS VS Percent: 16 %
Brady Statistic RA Percent Paced: 1.2 %
Brady Statistic RV Percent Paced: 84 %
Date Time Interrogation Session: 20250527020019
Implantable Lead Connection Status: 753985
Implantable Lead Connection Status: 753985
Implantable Lead Implant Date: 20220427
Implantable Lead Implant Date: 20220427
Implantable Lead Location: 753859
Implantable Lead Location: 753860
Implantable Pulse Generator Implant Date: 20220427
Lead Channel Impedance Value: 450 Ohm
Lead Channel Impedance Value: 450 Ohm
Lead Channel Pacing Threshold Amplitude: 0.75 V
Lead Channel Pacing Threshold Amplitude: 0.875 V
Lead Channel Pacing Threshold Pulse Width: 0.5 ms
Lead Channel Pacing Threshold Pulse Width: 0.5 ms
Lead Channel Sensing Intrinsic Amplitude: 5 mV
Lead Channel Sensing Intrinsic Amplitude: 5.6 mV
Lead Channel Setting Pacing Amplitude: 1.125
Lead Channel Setting Pacing Amplitude: 2.5 V
Lead Channel Setting Pacing Pulse Width: 0.5 ms
Lead Channel Setting Sensing Sensitivity: 2 mV
Pulse Gen Model: 2272
Pulse Gen Serial Number: 3920271

## 2024-02-06 NOTE — Progress Notes (Unsigned)
 OV 08/09/2020  Subjective:  Patient ID: Reginald Davis, male , DOB: 06-12-1946 , age 78 y.o. , MRN: [id number] , ADDRESS: [address] [place] [address] PCP Aldo Hun, MD Patient Care Team: Aldo Hun, MD as PCP - General (Internal Medicine) End, Veryl Gottron, MD as PCP - Cardiology (Cardiology) Cardiologist is Dr. Marda Shack This Provider for this visit: Treatment Team:  Attending Provider: Maire Scot, MD    08/09/2020 -   Chief Complaint  Patient presents with   Consult    COPD, DOE     HPI Reginald Davis 78 y.o. 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  History is given by the patient who also brought in very clearly typed notes about his history.  He says in the 1970s and 1980s while he was busy with his professional life he was a smoker and finally quit smoking in 1997.  His total pack smoking history might be 70 years.  He undergoes regular screening CT scans of the chest.  Most recently March 2021 that reported signs of emphysema and a small upper lobe nodule unchanged over many years and possible interstitial lung disease changes.  He says he was at baseline and doing well but in April 2021 he started noticing shortness of breath.  He was placed on Spiriva  which improved his symptoms daily.  After that he felt like significantly improved.  He then was dealing with significant difficult to control blood pressures with Dr. Nicholette Barley.  She has increased his dosage of the blood pressure medication.  He was remaining active walking approximately thousand steps daily and 30 minutes of walking 5 more days per week.  Then in September 2020 when he and his wife traveled to [facility]  and then drove to [place] which is an elevation of 4000-4500 feet.  After that they continue to [facility] at 6500-7500 feet.  They got very short of breath.  Even had a fever episode.  His pulse oximetry dropped to 84-85.  He ended up in the emergency  room and was given a diagnosis of COPD exacerbation.  Given doxycycline  and prednisone  and then he improved.  Post improvement in September and post return to [facility] he has had difficult to control blood pressure but he does have some residual shortness of breath with exertion relieved by rest.  His apple watch has noticed desaturations at night that are transient with pulse ox ranging 85 to 96%.  His resting heart rate at bed is between 54 and 90 at night.  His current walking desaturation test is below.  He dropped only 2 points but he did get tachycardic.  His pulmonary function test show slight reduction in DLCO but otherwise okay  He follows with Dr. Nicholette Barley who noticed he has left lower lobe crackles.  Due to persistent symptoms he has been referred to pulmonary clinic.  He wants to get to the bottom of the issues.  His echocardiogram shows grade 1 diastolic dysfunction.  He does suffer from some visceral obesity.   Of note he has upcoming air travel to [place] on August 15, 2020.  He is going to see his granddaughter who is not seen since the onset of the pandemic.  He believes he may not be traveling to high altitudes in [place].    ADDENDUM: There is a right lower lobe nodule that is seen between vessels in the right lower lobe on image 97 of  series 8. This nodule was compared to studies dating back to 2015 and shows no change. It is possible that this represents a lymph node along bronchovascular structures but again this is stable over a 6 year interval, obscured by surrounding vessels and bronchovascular structures.     Electronically Signed   By: Zara Heymann M.D.   On: 11/27/2019 14:45    Signed by Theadore Finger, MD on 11/27/2019  2:48 PM  Narrative & Impression  CLINICAL DATA:  Left upper lobe nodules, history of bronchiectasis and emphysema.   EXAM: CT CHEST WITHOUT CONTRAST   TECHNIQUE: Multidetector CT imaging of the chest was performed following  the standard protocol without IV contrast.   COMPARISON:  Coronary CT from 08/26/2018, CT chest from 09/20/2017   FINDINGS: Cardiovascular: Calcified atherosclerotic changes in the thoracic aorta without signs of aneurysm. Limited assessment on noncontrast evaluation. The heart size is normal without pericardial effusion. Coronary artery disease with three-vessel calcification as before.   Mediastinum/Nodes: Thoracic inlet structures are normal. No axillary lymphadenopathy. Small right juxta hilar lymph node, 9 mm unchanged since January of 2019 along with other scattered lymph nodes below CT criteria for pathologic enlargement. Limited assessment of hilar structures due to lack of contrast. Esophagus is mildly patulous with some gas in the lumen.   Lungs/Pleura: Small nodule between bronchovascular structures in the central portion of the right lower lobe (image 97, series 8) 11 x 7 mm, within 1 mm of its size in 2019. No dense consolidation. Mild basilar bronchiectasis. Subpleural reticulation as before with signs of centrilobular emphysema.   Tiny nodules in the left upper lobe are stable.   Upper Abdomen: Incidentally imaged portions of upper abdominal contents are unremarkable. No evidence of acute upper abdominal process.   Musculoskeletal: No chest wall mass. Sclerosis of the right clavicular head is unchanged since 2019. Spinal degenerative changes as before.   IMPRESSION: 1. Small nodule between bronchovascular structures in the central portion of the right lower lobe measuring 11 x 7 mm, within 1 mm of its size in 2019. This nodule is within 1 mm of its size in 2019. This nodule is unchanged since 2015 and this therefore benign. 2. Signs of emphysema and chronic interstitial changes with tiny upper lobe nodules are unchanged. 3. Coronary artery disease with three-vessel calcification.   Aortic Atherosclerosis (ICD10-I70.0) and Emphysema (ICD10-J43.9).    Electronically Signed: By: Zara Heymann M.D. On: 11/26/2019 09:11     08/29/2020- Interim hx Patient presents today to review testing and discuss starting anti-fiibrotic medication. CT imaging suggestive of probable UIP with progression since 2015, high suspicion towards IPF. He had negative serology. Dr. Bertrum Brodie recommend starting anti-fibrotic, either Esbriet  or OFEV. Accompanied by his wife during today's appointment. He is baseline today, shortness of breath and cough are the same as last visit. He was recently in [place] for 10 days, he did not need to take on hand doxycyline or prednisone . He had an overnight oximetry test on12/2/21 which showed <88% 5 hrs 35 mins, start 2L [place]. He has not received oxygen  yet, he was contacted by DME company. He is interested in [employer] continuous oxygen  concentrator and is willing to pay out of pocket in needed. He reports daytime oxygen  saturation runs between 87-92% RA. He is active, gets an average of 8,000 steps a day. He will be changing medicare part D plans to [employer] silver come January 1st 2022.     OV 09/29/2020  Subjective:  Patient ID:  Reginald Davis, male , DOB: June 15, 1946 , age 89 y.o. , MRN: [id number] , ADDRESS: [address] [place] [address] PCP Aldo Hun, MD Patient Care Team: Aldo Hun, MD as PCP - General (Internal Medicine) End, Veryl Gottron, MD as PCP - Cardiology (Cardiology)  This Provider for this visit: Treatment Team:  Attending Provider: Maire Scot, MD    09/29/2020 -   Chief Complaint  Patient presents with   Follow-up    6 min walk performed 09/22/20.  Pt states he feels like he has been stable since last visit. Pt is wearing 2L at night.  Pt states if he is on an incline, his sats are dropping some below 88% on room air.    IPF dx givien 08/26/20 based on "probable UIP on CT + negative srology, +  age > 15, _ white + male + prior smoker + heart burn/GERD hx-> this is "IPF"  Associated mild  emphysema present -started Spiriva   GERD hx  Pirfenidone  start this pending January 2022/February 2022  HPI TILDEN BROZ 78 y.o. -returns for follow-up.  He presents with his wife.  Since his last visit in the interim he saw nurse practitioner.  We gave him a diagnosis of IPF based on probable UIP on CT scan age greater than 66, Caucasian ethnicity, male gender, prior smoker, acid reflux history and negative serology.  He has accepted the diagnosis.  He presents with his wife at this point in time.  They have gone through pirfenidone  education and started paperwork.  He is waiting for co-pay approval.  At this point in time he feels stable his symptom score is below.  He is completed a 6-minute walk test but he did not desaturate below 88%.  The focus of this visit is multiple questions in general education and answering questions about idiopathic pulmonary fibrosis  -Overall his cough is improved.  He attributes his cough improvement to omeprazole .  However he wants to switch to pantoprazole .  He did some reading and he said that he is concerned about theoretical effects of omeprazole  and pirfenidone  plasma levels therefore he wants to switch to pantoprazole .  I have supported this and will do the prescription.  -He is very interested in clinical trials as a care option.  We discussed concept of clinical trials being voluntary and what care option meant.  He is probably leaning towards later phase trials.  We gave him consent form for PROMEDIOR Phase 3 and Pliant phase 2 studies but did indicate to him that there is currently a backlog and in addition he will have to be stable on antifibrotic's for a few months before he can enroll.  He is understanding of this.  -He is interested in the patient support group and I have given him the email for a local support group to the pulmonary fibrosis foundation  -He exercises regularly.  However he is interested in joining pulmonary rehabilitation a  referral has been made but he is still waiting to hear from them.  I have emailed the director to get a follow-up.  -We discussed the natural history of pulmonary fibrosis.  Especially IPF.  We discussed its progressive disease.  He and his wife wanted to know about prognostic markers.  Currently these are not commercially available.  In particular is interested in Kale-6.  Indicated that in the next few years, she will test might be available to differentiate prognosis but at this time we would follow clinically.  Currently based on current  severity and stability predicts future progression.  Also explained viral illnesses can exacerbate diseases  - Lung transplantation: He is interested in this concept.  He wants early referral to [facility] so he gets his options covered.  He knows he has to lose weight.  We discussed common barriers for lung transplantation including psychological Support, social support, financial costs.  He says he does not have any of this.  His BMI is 29 he needs to get to less than 27.  Explained to him nutrition is the way to go on this.  He is also going to do rehabilitation   - We also discussed his emphysema component being small.  He will continue with Spiriva   = Oxygenation -he is using nocturnal oxygen .  His apple watch shows his oxygen  levels to be normal now at night.  He is monitoring this.  He says that when he exerts heavily his oxygen  does drop but on the 6-minute walk test he did not drop.  He will pay for a portable oxygen  system on his own out-of-pocket.  Therefore we will do an order for this.        OV 11/10/2020  Subjective:  Patient ID: Reginald Davis, male , DOB: 11/22/1945 , age 62 y.o. , MRN: [id number] , ADDRESS: [address] [place] [address] PCP Aldo Hun, MD Patient Care Team: Aldo Hun, MD as PCP - General (Internal Medicine) End, Veryl Gottron, MD as PCP - Cardiology (Cardiology)  This Provider for this visit: Treatment  Team:  Attending Provider: Maire Scot, MD    11/10/2020 -   Chief Complaint  Patient presents with   Follow-up    Doing well    IPF dx givien 08/26/20 based on "probable UIP on CT + negative srology, +  age > 11, _ white + male + prior smoker + heart burn/GERD hx-> this is "IPF"  -Last CT scan of the chest December 2021  - Esbriet  start date -> Oct 26, 2020   - Pulm rehab Mar 2022   Associated mild emphysema present -started Spiriva     HPI NORVAL SLAVEN 78 y.o. -presents with his wife. This is for IPF follow-up. He finally got hold of his pirfenidone . He started this 10/26/2020. So far he is tolerating it well. Earlier this week on 11/08/2020 he started pulmonary rehabilitation. He feels stable. Wife feels he is an under perceiver. On a 6-minute walk test he dropped to as low as 89%. He had a pulmonary function test at [facility] that showed decline in DLCO. He is worried about this. We walked him again today and his pulse ox did drop to 88%. That could have been a variation in place but clearly this a difference suggesting progression. However he is tolerating his pirfenidone  fine. No side effects as evidenced below in the symptom score. He really wants to start ASAP on clinical trials. He is already met with [facility] for transplant as an option. He had to consent forms one for phase 3 IV infusion PRomedior and another for Phase 2 Pliant Part C. The latter has moved to Part D which is higher dose. He had more questions about this. We went over several components of clinical trials documented below. Explained to him about therapeutic misconception and secondary intent being therapeutic gain. Advised him that the primary intent should always be about drug development and contribution to science. He understood this. Explained to him currently there is supply chain issues with a phase 3  study. However the phase 2 sutdy is active. However he were to wait a few months before he is  eligible for either study because he just started on pirfenidone . He understands this. We have given him an updated consent form. PFT [facility] feb 2022 FVC 3.55L DLCO 11.02    Telephone call 11/30/20   Nov 2021 -> March 2022 with us   - FVC up - dlco down  Compared to [facility] feb 2022  -March 2022 with  Us   - FVC down  - DLCO up   Overall  - would stay stable  - there is mixed signal - nothing to do for now  - allow anti fibrotic kick in  - be patient   OV 12/21/2020  Subjective:  Patient ID: Reginald Davis, male , DOB: Jun 18, 1946 , age 69 y.o. , MRN: [id number] , ADDRESS: [address] [place] [address] PCP Aldo Hun, MD Patient Care Team: Aldo Hun, MD as PCP - General (Internal Medicine) End, Veryl Gottron, MD as PCP - Cardiology (Cardiology)  This Provider for this visit: Treatment Team:  Attending Provider: Maire Scot, MD    12/21/2020 -   Chief Complaint  Patient presents with   Follow-up    PFT performed 11/25/20.  Pt states he has been doing okay and denies any complaints.     IPF dx givien 08/26/20 based on "probable UIP on CT + negative srology, +  age > 36, + white + male + prior smoker + heart burn/GERD hx-> this is "IPF"  -Last CT scan of the chest December 2021  - Esbriet  start date -> Oct 26, 2020   - Pulm rehab Mar 2022  - Clinical trial education - Mar 2022 Lupe Salk D ICF given)  - PFF concept introduction - May 2022   Associated mild emphysema present -started Spiriva   Normal Echo May 2021  HPI Reginald Davis 78 y.o. -returns for follow-up with his wife.  He is now on pirfenidone  since mid 01/27/2021 and is tolerating it well.  He reports intentional weight loss.  He has some mild GI side effects that are reflected in the symptom score and this is stable.  He says with a combination of intentional weight loss and rehabilitation and oxygen  use [with exertion] his resting heart rate is now in the 60s.  He showed me his apple watch curve  about that.  He is pretty excited about that.  He has had us  for short of Covid vaccine mRNA.  He is really interested in clinical trials.  He has met with the research coordinator and is scheduled himself for Pliant study visit mid May 2022.  He has read the consent.  He is keeping up with the literature on pulmonary fibrosis.  He is pretty excited about an upcoming IND application approval for the pharmaceutical company [employer] for [employer] BUI [id number].  We will be a study site for this but probably in the fall or the winter 2022.  I told him I do not know much details about this drug as yet.  He says he will start the Pliant study currently.  He wants to know about travel.  He has some prednisone  and hand and also antibiotics.  We will go to [place] and [place].  He will take his portable oxygen  system with him.  I have approved this travel.  At room air at rest he is fine.  When he exerts himself with treadmill and exercise he uses 2-3 L pulse.  Most recent liver function test was normal.  This was in December 12, 2020.  He is worried about his progression.  Particular [facility]'s test showed a drop.  He then had pulmonary function test with us .  I put these results together.  It appears that there is lot of fluctuation particularly with the Navos test.  But on average it appears his DLCO is declined since he has been with us  but his FVC is stable.   He is enjoying his rehabilitation.  PFT   Subjective:  Patient ID: Reginald Davis, male , DOB: 1946-07-31 , age 48 y.o. , MRN: [id number] , ADDRESS: [address] [place] [address] PCP Aldo Hun, MD Patient Care Team: Aldo Hun, MD as PCP - General (Internal Medicine) Lei Pump, MD as PCP - Electrophysiology (Cardiology) Maudine Sos, MD as PCP - Cardiology (Cardiology)  This Provider for this visit: Treatment Team:  Attending Provider: Maire Scot, MD    08/22/2021 -   Chief Complaint   Patient presents with   Follow-up    Pt states he has had diarrhea for the past 6 weeks and is unsure if it is due to his medication.     IPF dx givien 08/26/20 based on "probable UIP on CT + negative srology, +  age > 45, + white + male + prior smoker + heart burn/GERD hx-> this is "IPF"  -Last CT scan of the chest December 2021  - Esbriet  start date -> Oct 26, 2020   - Pulm rehab Mar 2022  - Clinical trial education - Mar 2022 (Pliant D ICF given)     - Pliant study participant Anselm Kingfisher 02Jun2022 and was randomized 22Jun2022  - PFF concept introduction - May 2022   Associated mild emphysema present -started Spiriva   Normal Echo May 2021  HPI Reginald Davis 78 y.o. -   IPF: [facility] visit for OPF. On esbrit and also study (IMP v placebo). Has study  visit#10 in 19Dec, then come back for EOT on 30Jan and EOS on 14Feb and then he is done.  From a respiratory standpoint his symptom scores are slowly actually improved.  His FVC also shows slow improvement over time.  In August 2022 he had a walk test at [facility] and this shows improvement in walking distance.  His weight is stable.  He had recent high-resolution CT chest.  Compared to September 2022 his ILD is stable but compared to remote past there is progression.  Overall he is pleased with the symptoms and stability.  He uses oxygen  2 L at night and 3 L pulsed with exercise and while traveling.  He continues on pirfenidone  and study drug.  He should finish the study next month or so.  Rash: Sometime around September 2022 or so he developed punctate rash in his forearms and his back.  He says this still persist but it is stable its not any worse.  He is not overly concerned about this  Main new issue is that since the last 6 weeks he has had diarrhea.  Started as mild but since then is gotten progressive.  He says currently it is severe.  He is having a lot of flatulence and when he has significant amount of flatulence he is having some  amount of stool in his underwear.  For the last 3 weeks he is taking align probiotic.  For the last 1 week he is also taking a protein shake but he says  there is no sweetener in it.  For the last 1 week he is started Imodium.  After this diarrhea is better.  He still having diarrhea 3-4 times a day.  He is taking Imodium 3-4 times a day.  The stool is definitely more formed but it still worrisome for him.  He says he is cut down on nuts and fruits.  He denies any artificial sweetener.  Did medication review for diarrhea causing drugs: The only new introduction is a study drug and prior to that was the pirfenidone .  However he is on other drugs chronically that can cause diarrhea and this includes colchicine  greater than 20%, Diovan  greater than 5% and PPI.  Initially did not complain about pain but when I asked him he said sometimes he has some right lower quadrant discomfort.  There is no fever or bloody stools.  He had recent safety labs on 08/15/2021.  I personally reviewed this and this is normal.     Upcoming travel: He is plan to go to [place] in January 2023.  This is for 1-2 weeks and is to visit his daughter and his only grandchild.  He wants his diarrhea significantly addressed before the trip.   OV 11/28/2021  Subjective:  Patient ID: Reginald Davis, male , DOB: 07-Sep-1946 , age 47 y.o. , MRN: [id number] , ADDRESS: [address] [place] [address] PCP Aldo Hun, MD Patient Care Team: Aldo Hun, MD as PCP - General (Internal Medicine) Lei Pump, MD as PCP - Electrophysiology (Cardiology) Maudine Sos, MD as PCP - Cardiology (Cardiology)  This Provider for this visit: Treatment Team:  Attending Provider: Maire Scot, MD    11/28/2021 -   Chief Complaint  Patient presents with   Follow-up    Pt states he has been doing okay since last visit.   HPI LOWERY PAULLIN 78 y.o. -returns for follow-up.  Presents with his wife.  He tells me that he is doing  well overall.  After coming off the pliant study in February 2023 for 2 weeks he thought he had increased nocturnal desaturations but then this settled out and he is better.  He is intentionally lost weight.  He did go for a lung transplant evaluation at [facility] 11/01/2021.  His 6-minute walking test is improved.  His pulmonary function test also has improved [see below].  He attributes this improvement to being on the research study protocol.  We do not know if he was randomized to placebo or the actual medicine of product.  He believes he was randomized to the actual medicine product.  He did have some increased diarrhea during the study and he says this is now improved.  He is interested in more studies.  Although the current improvement in diarrhea is also because he is taking Imodium on a scheduled basis.  He continues to use 2 L at night and 3 L with exercise although 6-minute walk test had an improved distance and did not desaturate below 91% and he finished 15 laps at [facility].  He is interested in more clinical trials.  We we discussed a phase 2 oral anticollagen inhibitor study by [employer] sponsor.  He has taken the consent to review.  He is also interested in the future injection study that he came across in the Internet.  It is sponsored by [employer].  Were not a location for the study at this point in time but did indicate to him that we are in communication  and do not have much information to share with him about that.  He is going to read the Internet and let us  know what study he wants to do.  He continues to have dry skin.  We talked about flaxseed  He has tended travel to [place] in July 2023.  He wants us  Paxlovid  refilled.  He also wants doxycycline  and prednisone  refill for the trip.  I agreed to do that.    Subjective:  Patient ID: Reginald Davis, male , DOB: [birth year] , age 50 y.o. , MRN: [id number] , ADDRESS: [address] [place] [address] PCP Aldo Hun,  MD Patient Care Team: Aldo Hun, MD as PCP - General (Internal Medicine) Lei Pump, MD as PCP - Electrophysiology (Cardiology) Maudine Sos, MD as PCP - Cardiology (Cardiology)  This Provider for this visit: Treatment Team:  Attending Provider: Maire Scot, MD    03/02/2022 -   Chief Complaint  Patient presents with   Follow-up    Pt here for f/u on ILD, states breathing has been good some SOB increased coughing.      HPI ROQUE SCHILL 78 y.o. -returns for follow-up.  He has completed his pliant research protocol in February 2023.  He continues his as..  He uses 2 L nasal cannula at night.  He uses oxygen  with exertion.  Symptom score shows stable.  Pulmonary function test is stable.  However he feels that at night his Apple Watch on 2 L nasal cannula showing more desaturations than usual.  We did discuss about getting this formally tested with a overnight pulse oximetry to see if he would need more oxygen .  He is agreeable to this plan.  I did indicate to him that given stability in symptoms and pulmonary function test and the walking desaturation test  -pretest probably due for him desaturating at night more than usual would be low.  Is also willing to get echocardiogram and a BNP check.  Depending on this we might have to consider right heart catheterization if for WHO group 3 pulmonary hypertension is in the differential diagnosis.  But overall I think the probably of pulmonary hypertension is also low given the stability.   He continues to be interested in research protocols.  He is particularly interested in a study called  MOONSCAPE -which involves the STAT3 pathway against IPF and it would involve a subcutaneous injection.  He is reading a consent form.  He is willing to participate if he passes on prescreen.    He also believes that his acid reflux is slightly more active.  He is taking his PPI.  He is doing all the other measures.  But did notice that he is  taking fish oil.  He takes this for his hyperlipidemia.  I did indicate to him side effect of fish oil as acid reflux.  He is now on Praluent  for his hyperlipidemia which is working really well.  Therefore he is willing to stop the fish oil.   In terms of dry skin: He takes some flaxseed he is putting a new lotion and this is better.  In terms of travel he is going to go to [place] again to see his grandchild.  This will be in July 2023.  In terms of his complete heart block: He is on pacer but he believes the pacer is more active now.  OV 06/07/2022  Subjective:  Patient ID: Reginald Davis, male , DOB: 11/04/1945 , age 66  y.o. , MRN: [id number] , ADDRESS: [address] [place] [address] PCP Aldo Hun, MD Patient Care Team: Aldo Hun, MD as PCP - General (Internal Medicine) Lei Pump, MD as PCP - Electrophysiology (Cardiology) Maudine Sos, MD as PCP - Cardiology (Cardiology)  This Provider for this visit: Treatment Team:  Attending Provider: Maire Scot, MD    06/07/2022 -   Chief Complaint  Patient presents with   Follow-up    PFT was cancelled by pt.  Pt states he has had a few episodes where he has noticed lower O2 sats than before which was seen on walk test done by [facility]. Pt also states he has had a little more coughing than usual.     HPI Reginald Davis 78 y.o. -returns for follow-up this is standard of care visit.  I saw him 2 days ago as a research visit.  He enrolled into a new study.  The study involved a subcutaneous injection he signed consent.  He had screening pulmonary function test documented below.  For the first time his FEV1 FVC ratio is below 70.  He did stop Spiriva  before the breathing test based on protocol requirements.  In review of his pulmonary function test he had end of August another pulmonary function test at [facility] and even that the ratio has gone down.  Prior to that his FEV1 FVC ratio is always over 70.  The first  time this is gone down.  I did indicate to him I do not understand why but I have a few patients this is happened.  He feels stable.  His 6-minute walk test at [facility] was stable.  His symptom scores are stable.  Uses oxygen  with exercise.  His wife wanted me to caution him not to over exercise and desaturate too much.  I did tell him to keep his pulse ox over 88%.  He is very interested in clinical trials but he understands that if his FEV1 FVC ratio is less than 70 he will disqualify for many trials.  He says he has come to terms with this.  He is on Esbriet  and is tolerating this well without any problems.  He did have [facility] lung transplant visit I reviewed those notes.  They feel he is too early for transplant.    OV 08/27/2022  Subjective:  Patient ID: Reginald Davis, male , DOB: 10-31-1945 , age 43 y.o. , MRN: [id number] , ADDRESS: [address] [place] [address] PCP Aldo Hun, MD Patient Care Team: Aldo Hun, MD as PCP - General (Internal Medicine) Lei Pump, MD as PCP - Electrophysiology (Cardiology) Maudine Sos, MD as PCP - Cardiology (Cardiology)  This Provider for this visit: Treatment Team:  Attending Provider: Maire Scot, MD    08/27/2022 -   Chief Complaint  Patient presents with   Follow-up    Follow up for IPF. Pt states he has felt stable since last visit. Pt states he is back on his full dose of pirfenidone  now and is tolerating it well. Pt is also on Spirivia daily and no issues noted with that and albuterol  as needed. Pt states he has not felt any issues with his breathing since last visit Pt also had PFT on 12/13    HPI MOOSA BUECHE 78 y.o. -returns for standard of care visit.  In this visit he tells me that the back spasm he had he went to physical therapy and they found out that he had a  little bit of right shoulder droop from wearing his portable oxygen  this then created contralateral stretch.  Then after intense physical  therapy is better.  He feels this is also helped his lung function.  At last visit he was also having diarrhea.  We stopped the pirfenidone  but the diarrhea persisted.  We did extensive stool studies and it was normal.  I referred him to Dr. Alvis Jourdain, GI and right before he saw Dr. Nickey Barn he was actually taking Xifaxan which helped resolve the diarrhea.  He was on pirfenidone  holiday from early October 2023 through early November 2023 and he has slowly escalated his pirfenidone  back up to the full dose.  Initially he had some diarrhea upon starting pirfenidone  but now he is tolerating it well.  He is on full dose.  Through all this since September he is lost another 4 pounds of weight.  He feels that when he was off his pirfenidone  his weight loss has stabilized.  Currently the BMI is 24.4 and he is at ideal body weight.  He is also exercising aggressively and he attributes some of the weight loss because of this.  He keeps his oxygen  levels greater than 88%.  He has a new year pulse oximeter that he allows him to continuously track his pulse ox.  He is up-to-date with his respiratory vaccines.  He is driving to [place]  tomorrow to be with his daughter who is visiting him from [place] for Christmas.  He is still interested in research protocols.  He disqualified from a previous research study because of low T1/FVC ratio screening.  Currently standard of care rate she was fine and DLCO is also stable.  He wants to see if he will rescreen.  He is also interested in the study run by [employer] at another site.  He is going to send me some information to read about that particular product.    PFT  OV 12/20/2022  Subjective:  Patient ID: Reginald Davis, male , DOB: 11-30-1945 , age 53 y.o. , MRN: [id number] , ADDRESS: [address] [place] [address] PCP Aldo Hun, MD Patient Care Team: Aldo Hun, MD as PCP - General (Internal Medicine) Lei Pump, MD as PCP -  Electrophysiology (Cardiology) Maudine Sos, MD as PCP - Cardiology (Cardiology)  This Provider for this visit: Treatment Team:  Attending Provider: Maire Scot, MD  12/20/2022 -   Chief Complaint  Patient presents with   Follow-up    [employer] f/u,      HPI Reginald Davis 78 y.o. -returns for follow-up.  Is a 40-month follow-up.  He believes he is overall stable.  But he is worried that he is desaturating a little bit more easily.  He states that 1 time when he walked downhill after walking the dog his pulse ox dropped.  Sometimes at night he is now needing 3 L nasal cannula to avoid transient desaturations into 89%.  He is also noticed some desaturations when he walks.  This is through the EAR pulse ox oximeter probe..  However objectively there is no evidence that he is getting worse.  His symptom scores remained stable [he believes he might be under perceiving his symptoms].  His pulmonary function test also is stable [see below].  His walking desaturation test is also stable along with a 6-minute walk test at [facility].  We took a shared decision making for him to do and an exercise distance to desaturation at time to desaturation  test on his treadmill at the same particular pace and the same incline and measure this every 2 weeks every 4 weeks is a good objective way to monitor if he is desaturating and getting worse or not.  He is going to do that.     Meanwhile he continues to be interested in research protocols.  He is registered with [facility] and is looking at a protocol with [employer].  He had screen failed with us  because of FEV1 FVC ratio being low.  He says at the location his PFT ratio was normal and he might screen passed.  He did show his liver function test from that visit and it was normal.  This was in the first week of April 2024.  He also showed a CT report.  There is saying there is early evidence of honeycombing.  However there is no comparison.  I  did indicate to him that the report could be inaccurate but also could mean that his IPF is evolving and he is getting honeycombing now.  Regardless his symptoms and pulmonary function test and walk test are stable.  He visited Dr. Ceclia Cohens 11/02/2022 at [facility] lung transplant clinic.  For the disease was going very slow progression.  And he might not need a lung transplant given his age and slow progression.  He showed a letter dated 11/14/2022 where at this point in time they are putting a pause on his transplant evaluation  He plans to travel to the [place] end of May 2024: He he wants to take emergency pack of Paxlovid , doxycycline  and prednisone .  We will do this for him.    OV 04/22/2023  Subjective:  Patient ID: Reginald Davis, male , DOB: May 21, 1946 , age 80 y.o. , MRN: [id number] , ADDRESS: [address] [place] [address] PCP Aldo Hun, MD Patient Care Team: Aldo Hun, MD as PCP - General (Internal Medicine) Lei Pump, MD as PCP - Electrophysiology (Cardiology) Maudine Sos, MD as PCP - Cardiology (Cardiology)  This Provider for this visit: Treatment Team:  Attending Provider: Maire Scot, MD   04/22/2023 -   Chief Complaint  Patient presents with   Follow-up    4 months f/up, no complaints     HPI Reginald Davis 78 y.o. -returns for follow-up.  Presents with his wife Deadra Everts.  She is an independent historian.  After the last visit the trip to [place] got canceled because Deadra Everts developed non-COVID respiratory viral infection and was very congested.  This then reduce his exercise.  He was also at the beach last week with his family.  His daughter from [place] and his son-in-law visited them.  Also his daughter from near [place] visited them.  He said because of all this he has gained some weight but he is trying to lose it.  His shortness of breath itself is stable.  At home he monitors his distance to desaturation.  On his treadmill with a  speed of 2.6 on a incline of 0 he desaturates on 1 minute.  He feels this is stable.  However he is concerned that his 6-minute walk distance has dropped.  In February it was 1728 feet at [facility].  Currently in June 2024 is 1535 feet.  The latter was done at [facility].  He says the hallway was crowded and there were people there and was not a straight line.  Nevertheless his pulse ox did go down to 87%.  He  showed me those results.  He also showed his pulmonary function test from [facility] chest as part of the research protocol.  His FVC stable but his DLCO might have declined.  Overall this conflicting data about his decline.  His symptom score and assistance of desaturation at home are stable but his DLCO seems a little worse and then his 6-minute walk test is also a little bit worse but these are all done at different institutions in different set up.  His current therapy is generic pirfenidone  along with the [employer] study drug.  He believes he might be getting placebo although he is blinded because he does not have any side effects.   -His FVC in April 2024 3.6 at the study site.  In July 2024 was 3.78.  His DLCO in June 2024 was 12.28.  I reviewed external records for this that he showed me.'   OV 07/23/2023  Subjective:  Patient ID: Reginald Davis, male , DOB: [birth year] , age 2 y.o. , MRN: [id number] , ADDRESS: [address] [place] [address] PCP Aldo Hun, MD Patient Care Team: Aldo Hun, MD as PCP - General (Internal Medicine) Lei Pump, MD as PCP - Electrophysiology (Cardiology) Maudine Sos, MD as PCP - Cardiology (Cardiology)  This Provider for this visit: Treatment Team:  Attending Provider: Maire Scot, MD    07/23/2023 -   Chief Complaint  Patient presents with   Follow-up    Breathing is stable today. He has days that he feels like his breathlessness is getting worse. He has occ cough- prod with gray sputum.       HPI GHAZI RUMPF 78 y.o. -presents for follow-up with his wife.  He is currently on pirfenidone  and tolerating it well overall..  Main issue is that he is concerned that his pulmonary fibrosis/IPF is slowly getting worse.  He showed spirometry data from the recent facility in [place].  There is a decline in FVC.  He has not had a DLCO of the study since April 2024.  He is also not had a repeat CT chest of 6-minute walk test.  These are pending at some point in time later the study.  He is taking study drug.  He is tolerating this fine.  His symptom score shows continued stability.  In addition his sit/stand exercise hypoxemia test appears adequate.  He did show exercise hypoxemia test at home but 5 minutes into the treadmill he desaturated to 89% this was on 07/18/2023.  There is quite a bit of variation.  Sometimes he is going to 1/2 minutes before desaturate and then 1 to have any personal breathing it took him 20 minutes to desaturate to 89%.  Because of his concerns of shortness of breath he did have a cardiac stress test in October 2024.  I reviewed that report.  He is got a good ejection fraction and it is basically normal.   - Other issues  - he continues to have fashion related issues particularly his low back and is working with physical therapy for that which is helping him. - Therapeutic monitoring.  Safety labs and research are normal according to his history and the sheet that he showed me.  He also had blood work with Dr. Genelle Kennedy on 07/19/2023.  He brought the review of charts with him.  His LFT is normal.  And to assess creatinine. -He has mild associated emphysema.  He is on Spiriva .  He wants an alternative.  I restarted him  to find out which alternative might be cheaper through his insurance.  This would be  INcruse or Tudorza     OV 11/04/2023  Subjective:  Patient ID: Reginald Davis, male , DOB: 1946-04-17 , age 60 y.o. , MRN: [id number] , ADDRESS: [address] [place] [address] PCP Aldo Hun, MD Patient Care Team: Aldo Hun, MD as PCP - General (Internal Medicine) Lei Pump, MD as PCP - Electrophysiology (Cardiology) Maudine Sos, MD as PCP - Cardiology (Cardiology)  This Provider for this visit: Treatment Team:  Attending Provider: Maire Scot, MD  11/04/2023 -   Chief Complaint  Patient presents with   Follow-up     HPI Reginald Davis 78 y.o. -presents for followup with wife [employer]on [employer]. LAst visit Nov 2024. HE coninues on esbriet . He continues on BMS study drug v Placebo at [facility]. In summer 2025 he will roll over to OLE. There might be a CT chest at at that time (he will find out). HE is tolerating the study drug well. Tolerating esbriet  as well. HOwever, he feels he is declinng. HIs PFTs show that but DLCO decline > FVC. He says that when he does TMT at home at 2. on RA at 0% incline -> he desaturates at 2 minues . Previously was much better.. Also feels he needs o2 during talking HE has dropped o2 when he watches TV. When he goes to mail box and back he frequently desaturates Night he uses 3L [place] and his apple iOX does not desaturate. Last echo summer 2023  Other issues  - LDH at research ws slighty high but now better - K 5.8 and then 5.6 few days ago. -> I agreed to recheck - PSA was high at with PCP 4.65 in May 2025 and 5.5 in Nov 2024 - wants recheck -. 7  - VIt D preivsly low -> on replacement through me. No recheck so far       OV 02/07/2024  Subjective:  Patient ID: Reginald Davis, male , DOB: 02-13-1946 , age 59 y.o. , MRN: [id number] , ADDRESS: [address] [place] [address] PCP Aldo Hun, MD Patient Care Team: Aldo Hun, MD as PCP - General (Internal Medicine) Lei Pump, MD as PCP - Electrophysiology (Cardiology) Maudine Sos, MD as PCP - Cardiology (Cardiology)  This Provider for this visit: Treatment Team:  Attending Provider: Maire Scot,  MD    02/07/2024 -   Chief Complaint  Patient presents with   Follow-up    Dry Cough, occasional white productive cough, SOB      IPF dx givien 08/26/20 based on "probable UIP on CT + negative srology, +  age > 19, + white + male + prior smoker + heart burn/GERD hx-> this is "IPF"  -Last CT scan of the chest December 2022  - Esbriet  start date -> Oct 26, 2020   - Pulm rehab Mar 2022  - Clinical trial education - Mar 2022 (Pliant D ICF given)     - Pliant study participant Anselm Kingfisher 02Jun2022 and was randomized 22Jun2022 -0> completed early (feb) 2023   - [place] [facility] Vixarelim[facility] study - Screen failed due to low ratio in fev1/fvc   -He is on [employer] study drug through [facility] chest.  - PFF concept introduction - May 2022   -National conference November 2023     Associated mild emphysema present -started Spiriva   Normal Echo May 2021 and summer 2023  WEight loss  April 2022: 171#  June 2023: 162#  SEpt 2023: 158# - bmi 25.14 Aug 2022: 154#- bmi 24.13 December 2022-155 pounds  Aug 2024: 164#  Feb 2025: 169#  May 2025: 172#     Esbriet /Pirfenidone  requires intensive drug monitoring due to high concerns for Adverse effects of , including  Drug Induced Liver Injury, significant GI side effects that include but not limited to Diarrhea, Nausea, Vomiting,  and other system side effects that include Fatigue, headaches, weight loss and other side effects such as skin rash. These will be monitored with  blood work such as LFT initially once a month for 6 months and then quarterly   HPI KENLEE MALER 78 y.o. -returns for follow-up.  Presents with wife Orelia Binet.  He and she just returned from [place] in [place].  They spent 2 weeks there.  He said he ate well and gained some weight.  But he believes he is doing stable.  He feels at the time he saw me he was desaturating more than usual.  He showed his home pulse ox monitoring as evidence of that.  He is  continuing his study drug with [place] and research.  He showed his latest FVC from a first 2025 and shows continued decline.  He agrees that over time slowly he is declining.  His 52-week end of study visit is coming up he is going to have spirometry and DLCO and high-resolution CT scan of the chest.  He is very curious about these results.  After this he is considering open label extension.  I did make him aware that very likely in the next few months new antifibrotic PDE 4 inhibitor called Nerandomilast is likely to be approved.  This is diuresis side effect but less than nintedanib.  It is by the same pharmaceutical company BIthat makes nintedanib.  He is aware of that.  Meanwhile last visit when I checked his PSA it was greater than 7.  This is resulted in urologic evaluations.  His primary urologist Dr. Joie Narrow retired.  Has been assigned to Dr. Arlice Lai pace.  He strongly prefers to have a transperineal biopsy because the risk of sepsis is much lower compared to a transrectal biopsy.  However apparently that biopsy is done only general anesthesia here at [facility].  He prefers to have it under local anesthesia.  Therefore he reached out to [facility] and they said they stopped doing it under local anesthesia.  Now he is planning to reach out to [facility] and maybe subsequently to [facility] to see if it can be done under local anesthesia at this because he is worried about IPF flareup.  He said even his urologist were worried about this.  I advised him that while the risk of IPF flareup with general anesthesia is possible his current risk because he is not on oxygen  at rest and he only desaturates with some amount of exertion that the risk of having a short procedure that does not involve thoracotomy laparotomy is actually low.  I said it would be preferable for him to undergo procedure with a much lower risk of sepsis and to take some risk with general anesthesia in this situation.   He is aware of this.  He is going to try to talk to his urologist or try to look for a urologist ideally who can do it under local anesthesia but failing which he will have it under general.  At that point he  will decide where to have this procedure.  I did indicate to him that there are advantages of having it locally but ultimately he will have to choose the surgeon and team for this.  He is understanding and is aligned.    SYMPTOM SCALE - ILD 09/29/2020  11/10/2020 Now on esbriet  sinc 10/26/20 12/21/2020 esbriet  + 171# 08/22/2021 Esbriet  and Pliant study drug , 173# 11/28/2021 Esbriet , off plaint study, 2L [place] night, 3L [place] exercise, 163# intential weight loss 03/02/2022 162# night o2 , ex o2 subjectively 06/07/2022 Uses exercise oxygen  08/27/2022 154#  - diarrhea sept/oc and took esbriet  holiday early oc-nov 2023 and needex xifaxan. BAck on esbrie at time of this visit 12/20/2022  04/22/2023 Generic pirfenidone  _ BMS study drug vial [facility] 07/23/2023 Generic esbreit + BMS study drug 11/04/2023 Genreic esbriet  + BMS study drug 02/07/2024 Generic pirfenodne + BMS study drug  O2 use ra  ra a t rest 2-3L with exercise pulsed 2L [place] at night, 3L pulsed during exerise and travels           Shortness of Breath 0 -> 5 scale with 5 being worst (score 6 If unable to do)              At rest 0.5 0 0.5 1 1 1  0.5 1 1  0/5 1 1  0/5  Simple tasks - showers, clothes change, eating, shaving 1 00 0 0 1 1 1 1 1 0  1 1 0  Household (dishes, doing bed, laundry) 1 0 0.5 1 2  1.5 1 2 2 1 2 2 1   Shopping 1 0 0.5 1 2  1.5 1 1.5 1 1 1 1 1   Walking level at own pace 1.5 1 1  0 2 2 2 2 2 1 1 2  1.5  Walking up Stairs *2.25 2 2 2 3 3 2  2.5 3 1.5 2 2 2   Total (30-36) Dyspnea Score 7.25 3 6 5 11 10  7.5 10 10 5 8 9 6   How bad is your cough? Improved with ppi 1 1 ` 1 1 0.5 1 1 1 1 1 1   How bad is your fatigue 0 0 0.5 ` 1 1 1 1 1  0 1 1 1   How bad is nausea 0 0 0 0 0 0 0 0 0 0  0 0 0  How bad is vomiting?  0 00 0 0 0 0 0 0 1 0  0 0 0   How bad is diarrhea? 0.5 coffee 0 0.25 1 1  0 2.5 1 0 0 0 0 0  How bad is anxiety? 0.5 0 0.5 1 0 0 0.5 0 0 0 00 0 0  How bad is depression 0 0 0 0 0 0 0 0  0 0 1 0   Walk test Simple office walk 185 feet x  3 laps goal with forehead probe 08/09/2020  [facility] 89% and HR111. 1296 fet. 0L o2 needed. Pl JR 111/ done 10/24/20  08/22/2021 - at [facility] 05/01/21 - walked 1671 feet wihout o2. This is an imprement  03/02/2022  163# -11/01/2021 [facility] the 6-minute walk and completed 174 6 feet.  Lowest pulse ox was 91%.  Heart rate was 107.  Did 15 labs.  Did not need oxygen . Aug 2023 at [facility] feet . Did not need o2 08/27/2022  12/20/2022 11/01/2022 6-minute walk test at [facility] 1728 feet/116 % predicted.  Lowest pulse ox was 91% on room air.  Maximum heart  rate was 111 February 26, 2023 research at [facility] chest 6-minute walk test -> 468 m [4098 feet].  Started at 92% ended up at 87%.  This was not a straight hallway and may need zigzag 09/17/2023 [facility]: [address], lowest pulse ox 85% 07/23/2023  02/07/2024   O2 used ra ra ra ra  ra ra   ra   Number laps completed 3 3 3 3  3 3    Sist and stand x 15   Comments about pace normal nL           Resting Pulse Ox/HR 96% and 80/min 96% and 90.min 99% and 60 97% and HR 62  100% and HR 68 95% and HR 67   98% and HR 61   Final Pulse Ox/HR 94% and 90/min 88% and 97/min 95% and 94 94% and HR 84  97% and HR 106 92% and HR 72   93% and HR 70   Desaturated </= 88% no yes yno no  no no      Desaturated <= 3% points no Yes, 8 points Yes, 4 Yes, 3 points  Yes, 3 points Yes 3 points      Got Tachycardic >/= 90/min yes yes yes no  yes yes      Symptoms at end of test dyspnea Some dyspnea Mild dyspea none  none no      Miscellaneous comments x   Bris pad  stable               Latest Ref Rng & Units 01/09/24 [facility] researc 10/23/23 [facility]  Research 09/17/2023 [facility] Reseach 06/19/23 [facility] Research 02/26/23  Research at [facility] 12/20/2022    12:19 PM 08/22/2022    8:53 AM 03/02/2022    8:49 AM 11/25/2020    8:52 AM 07/12/2020   11:53 AM  PFT Results  FVC-Pre L 3.23 3.45 3.58 3.35 3.92 3.73  3.94  3.89  3.43  3.36   FVC-Predicted Pre %  98.5%    105  111  108  94  88   FVC-Post L          3.41   FVC-Predicted Post %          90   Pre FEV1/FVC % %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

## 2024-02-06 NOTE — Patient Instructions (Incomplete)
 ICD-10-CM   1. IPF (idiopathic pulmonary fibrosis) (HCC)  J84.112 Basic Metabolic Panel (BMET)    B Nat Peptide    Vitamin D  1,25 dihydroxy    ECHOCARDIOGRAM COMPLETE    2. Dyspnea on exertion  R06.09 Basic Metabolic Panel (BMET)    B Nat Peptide    Vitamin D  1,25 dihydroxy    ECHOCARDIOGRAM COMPLETE    3. Medication monitoring encounter  Z51.81 Basic Metabolic Panel (BMET)    B Nat Peptide    Vitamin D  1,25 dihydroxy    ECHOCARDIOGRAM COMPLETE    4. Exercise hypoxemia  R09.02 Basic Metabolic Panel (BMET)    B Nat Peptide    Vitamin D  1,25 dihydroxy    ECHOCARDIOGRAM COMPLETE    5. Vitamin D  deficiency  E55.9 Basic Metabolic Panel (BMET)    B Nat Peptide    Vitamin D  1,25 dihydroxy    ECHOCARDIOGRAM COMPLETE    6. Elevated PSA, less than 10 ng/ml  R97.20 Basic Metabolic Panel (BMET)    B Nat Peptide    Vitamin D  1,25 dihydroxy    ECHOCARDIOGRAM COMPLETE      #IPF: This is slowly progressive but we need to rule out new development of pulmonary hypertension. You are on max therapy On generic pirfenidone  + BMS study drug ; tolerating both well without side effect  #Recent elevations in potassium  -noted in the study protocol at [facility].  We will need to recheck.  # History of elevated PSA November 2024  # Low vitamin D  on replacement    Plan   - continue pirfenidone  as before - continue study drug [employer] through [place] chest] -Check BNP and echocardiogram rule out pulmonary hypertension  -Based on the results might have to proceed to right heart catheterization -Recheck potassium through BMET [elevated at [place] chest study protocol - CHck PSA -Otherwise safety monitoring through recent labs at [facility] chest -Continue portable oxygen  with exertion - Continue nighttime oxygen  [we have sent an order for [facility] to have them supply nighttime oxygen ]  - continue daily exercise - keep pulse ox  >-= 88%  - once every few weeks - measure  time or distance to desaturation on room air by walking at same pace and incline at treadmill - wil see if new drug is approved next few months     Follow-up - At 57-month standard of care visit with Dr. Bertrum Brodie 30 minutes-  -Symptom questionnaire and walk test at follow-up

## 2024-02-07 ENCOUNTER — Ambulatory Visit: Admitting: Internal Medicine

## 2024-02-07 ENCOUNTER — Encounter: Payer: Self-pay | Admitting: Internal Medicine

## 2024-02-07 VITALS — BP 152/91 | HR 57 | Ht 66.0 in | Wt 172.6 lb

## 2024-02-07 DIAGNOSIS — J84112 Idiopathic pulmonary fibrosis: Secondary | ICD-10-CM

## 2024-02-07 DIAGNOSIS — R972 Elevated prostate specific antigen [PSA]: Secondary | ICD-10-CM

## 2024-02-07 DIAGNOSIS — R0609 Other forms of dyspnea: Secondary | ICD-10-CM

## 2024-02-07 DIAGNOSIS — R0902 Hypoxemia: Secondary | ICD-10-CM

## 2024-02-07 DIAGNOSIS — Z87891 Personal history of nicotine dependence: Secondary | ICD-10-CM

## 2024-02-07 DIAGNOSIS — Z01811 Encounter for preprocedural respiratory examination: Secondary | ICD-10-CM | POA: Diagnosis not present

## 2024-02-09 ENCOUNTER — Ambulatory Visit: Payer: Self-pay | Admitting: Cardiology

## 2024-02-11 DIAGNOSIS — N4289 Other specified disorders of prostate: Secondary | ICD-10-CM | POA: Diagnosis not present

## 2024-02-11 DIAGNOSIS — N320 Bladder-neck obstruction: Secondary | ICD-10-CM | POA: Diagnosis not present

## 2024-02-11 DIAGNOSIS — R972 Elevated prostate specific antigen [PSA]: Secondary | ICD-10-CM | POA: Diagnosis not present

## 2024-02-17 DIAGNOSIS — R972 Elevated prostate specific antigen [PSA]: Secondary | ICD-10-CM | POA: Diagnosis not present

## 2024-02-24 DIAGNOSIS — D225 Melanocytic nevi of trunk: Secondary | ICD-10-CM | POA: Diagnosis not present

## 2024-02-24 DIAGNOSIS — D1801 Hemangioma of skin and subcutaneous tissue: Secondary | ICD-10-CM | POA: Diagnosis not present

## 2024-02-24 DIAGNOSIS — D2272 Melanocytic nevi of left lower limb, including hip: Secondary | ICD-10-CM | POA: Diagnosis not present

## 2024-02-24 DIAGNOSIS — L812 Freckles: Secondary | ICD-10-CM | POA: Diagnosis not present

## 2024-02-24 DIAGNOSIS — D2271 Melanocytic nevi of right lower limb, including hip: Secondary | ICD-10-CM | POA: Diagnosis not present

## 2024-02-24 DIAGNOSIS — L821 Other seborrheic keratosis: Secondary | ICD-10-CM | POA: Diagnosis not present

## 2024-02-29 DIAGNOSIS — M545 Low back pain, unspecified: Secondary | ICD-10-CM | POA: Diagnosis not present

## 2024-02-29 DIAGNOSIS — R109 Unspecified abdominal pain: Secondary | ICD-10-CM | POA: Diagnosis not present

## 2024-03-03 DIAGNOSIS — R972 Elevated prostate specific antigen [PSA]: Secondary | ICD-10-CM | POA: Diagnosis not present

## 2024-03-09 ENCOUNTER — Telehealth: Payer: Self-pay | Admitting: Internal Medicine

## 2024-03-09 ENCOUNTER — Telehealth: Payer: Self-pay | Admitting: *Deleted

## 2024-03-09 NOTE — Telephone Encounter (Signed)
 Copied from CRM [id number]. Topic: Medical Record Request - Provider/Facility Request >> Mar 09, 2024  3:32 PM Shona RAMAN wrote: Reason for CRM: [facility] is calling because they urgently need patient medical records faxed to them so that patient can get his oxygen  machine before this Friday, they are asking for it to please be sent today, Fax - [phone]  Phone - [phone]  Caller-Ian  I have faxed the form and confirmation was received  I have sent this to scan

## 2024-03-09 NOTE — Telephone Encounter (Signed)
 Copied from CRM [id number]. Topic: Medical Record Request - Provider/Facility Request >> Mar 09, 2024  3:32 PM Shona RAMAN wrote: Reason for CRM: [facility] is calling because they urgently need patient medical records faxed to them so that patient can get his oxygen  machine before this Friday, they are asking for it to please be sent today, Fax - [phone]  Phone - [phone]  Caller-Ian  Pcc's can you please help with this.

## 2024-03-09 NOTE — Telephone Encounter (Signed)
 Pl;ease send chart notes from last OV/

## 2024-03-09 NOTE — Telephone Encounter (Signed)
 Patient presents to the front, he is here with a form that needs to be filled out asap for O2 concentrator so he can get the concentrator this Wednesday. Please fill this form out, I have put the form in Ramaswamy's box up front!

## 2024-03-09 NOTE — Telephone Encounter (Signed)
 Form placed in MR's yellow sign folder to be completed 03/10/24

## 2024-03-09 NOTE — Telephone Encounter (Signed)
 Duplicate message Sonny has taken care of this

## 2024-03-21 DIAGNOSIS — R109 Unspecified abdominal pain: Secondary | ICD-10-CM | POA: Diagnosis not present

## 2024-03-21 DIAGNOSIS — M545 Low back pain, unspecified: Secondary | ICD-10-CM | POA: Diagnosis not present

## 2024-03-25 NOTE — Addendum Note (Signed)
 Addended by: TAWNI DRILLING D on: 03/25/2024 05:07 PM   Modules accepted: Orders

## 2024-03-25 NOTE — Progress Notes (Signed)
 Remote pacemaker transmission.

## 2024-03-31 ENCOUNTER — Encounter: Payer: Self-pay | Admitting: Primary Care

## 2024-03-31 ENCOUNTER — Ambulatory Visit: Payer: Self-pay

## 2024-03-31 ENCOUNTER — Telehealth: Payer: Self-pay | Admitting: Primary Care

## 2024-03-31 ENCOUNTER — Ambulatory Visit (INDEPENDENT_AMBULATORY_CARE_PROVIDER_SITE_OTHER)

## 2024-03-31 ENCOUNTER — Ambulatory Visit: Admitting: Primary Care

## 2024-03-31 VITALS — BP 134/78 | HR 69 | Temp 98.1°F | Ht 66.0 in | Wt 175.2 lb

## 2024-03-31 DIAGNOSIS — J209 Acute bronchitis, unspecified: Secondary | ICD-10-CM

## 2024-03-31 DIAGNOSIS — J84112 Idiopathic pulmonary fibrosis: Secondary | ICD-10-CM

## 2024-03-31 DIAGNOSIS — R918 Other nonspecific abnormal finding of lung field: Secondary | ICD-10-CM | POA: Diagnosis not present

## 2024-03-31 DIAGNOSIS — J189 Pneumonia, unspecified organism: Secondary | ICD-10-CM

## 2024-03-31 NOTE — Telephone Encounter (Signed)
 If hypemia is not worse then is more like acute bronchitis. He should do the doxy and pred. Re the LDH: not sure what to make of it but he should do the doxy/pred
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
## 2024-03-31 NOTE — Telephone Encounter (Signed)
 Seen today for IPF/bronchiectasis flare up He has doxy and prednisone  on hand but has not taken Getting CXR today  He is enrolled in [employer] study drug through [facility] chest.  LDH was recently elevated 279, any concern?

## 2024-03-31 NOTE — Telephone Encounter (Signed)
 CXR showed chronic interstitial coarsening with possible subtle airspace disease in the right mid lung.   Hypoxemia is not worse  Are you still ok with doxy>?

## 2024-03-31 NOTE — Telephone Encounter (Signed)
 FYI Only or Action Required?: FYI only for provider.  Patient is followed in Pulmonology for IPF, last seen on 02/07/2024 by Geronimo Amel, MD.  Called Nurse Triage reporting Cough.  Symptoms began several days ago.  Interventions attempted: OTC medications: Mucinex  and Maintenance inhaler.  Symptoms are: unchanged.  Triage Disposition: See HCP Within 4 Hours (Or PCP Triage)  Patient/caregiver understands and will follow disposition?: Yes       Copied from CRM [id number]. Topic: Clinical - Red Word Triage >> Mar 31, 2024  8:31 AM Benton O wrote: [facility] that prompted transfer to Nurse Triage: need to see to see if i can get worked in having issues with breathing low grade fever increased heart rate increased respiratory rate overnight increase frequency of prodoctive cough    Sees dr geronimo Reason for Disposition  [1] MILD difficulty breathing (e.g., minimal/no SOB at rest, SOB with walking, pulse < 100) AND [2] still present when not coughing  Answer Assessment - Initial Assessment Questions E2C2 Pulmonary Triage - Initial Assessment Questions Chief Complaint (e.g., cough, sob, wheezing, fever, chills, sweat or additional symptoms) *Go to specific symptom protocol after initial questions. Low grade fever (tmax 99.1) , productive gray cough, SOB, fatigue  How long have symptoms been present? Sunday  Have you tested for COVID or Flu? Note: If not, ask patient if a home test can be taken. If so, instruct patient to call back for positive results. Yes, COVID neg  MEDICINES:   Have you used any OTC meds to help with symptoms? Yes If yes, ask What medications? Mucinex  - twice a day daily  Have you used your inhalers/maintenance medication? Yes If yes, What medications? Spiriva  - 2 puffs once a day Albuterol  INH PRN - has never used   If inhaler, ask How many puffs and how often? Note: Review instructions on medication in the chart. See  above  OXYGEN : Do you wear supplemental oxygen ? Yes If yes, How many liters are you supposed to use? 3L at night and 4L PRN  Do you monitor your oxygen  levels? Yes If yes, What is your reading (oxygen  level) today? 93-94 RA HR 74  What is your usual oxygen  saturation reading?  (Note: Pulmonary O2 sats should be 90% or greater) Overnight 90-91 on 3 L      1. ONSET: When did the cough begin?      See above 2. SEVERITY: How bad is the cough today?      See above 3. SPUTUM: Describe the color of your sputum (e.g., none, dry cough; clear, white, yellow, green)     See above 4. HEMOPTYSIS: Are you coughing up any blood? If Yes, ask: How much? (e.g., flecks, streaks, tablespoons, etc.)     denies 5. DIFFICULTY BREATHING: Are you having difficulty breathing? If Yes, ask: How bad is it? (e.g., mild, moderate, severe)      Mild - with exertion Triager does not appreciate audible SOB/wheezing during call. Pt is speaking in full sentences.  6. FEVER: Do you have a fever? If Yes, ask: What is your temperature, how was it measured, and when did it start?     Low grade 7. CARDIAC HISTORY: Do you have any history of heart disease? (e.g., heart attack, congestive heart failure)      Endorses pace maker Denies other hx 8. LUNG HISTORY: Do you have any history of lung disease?  (e.g., pulmonary embolus, asthma, emphysema)     IPF 9. PE RISK FACTORS: Do you  have a history of blood clots? (or: recent major surgery, recent prolonged travel, bedridden)     denies 10. OTHER SYMPTOMS: Do you have any other symptoms? (e.g., runny nose, wheezing, chest pain)       Slight nasal discharge Denies other sx 11. PREGNANCY: Is there any chance you are pregnant? When was your last menstrual period?       N/a 12. TRAVEL: Have you traveled out of the country in the last month? (e.g., travel history, exposures)       N/a  Protocols used: Cough - Acute  Productive-A-AH

## 2024-03-31 NOTE — Telephone Encounter (Signed)
 Yes still ok with dox

## 2024-03-31 NOTE — Patient Instructions (Addendum)
  VISIT SUMMARY: You visited today due to an increase in your usual cough, fever, and shortness of breath. You also reported experiencing diarrhea and noted a recent spike in your lactate dehydrogenase (LDH) levels. A home COVID test was negative. We discussed your current medications and your participation in a clinical trial.  YOUR PLAN: -EXACERBATION OF BRONCHIECTASIS: This is a worsening of your bronchiectasis, which is a condition where the airways in your lungs become damaged and widened, leading to mucus build-up and infections. A chest x-ray will be done to rule out pneumonia.  We will likely advised you start taking doxycycline  and prednisone  to help manage this exacerbation. I will notify Dr. Geronimo about your elevated LDH levels and monitor your response to the treatment. Report back if there is no improvement.  -IDIOPATHIC PULMONARY FIBROSIS (IPF): IPF is a lung disease that causes scarring of the lung tissue, making it difficult to breathe. You should continue taking pirfenidone  three times a day as directed   -EMPHYSEMA: Emphysema is a type of chronic lung disease that causes shortness of breath due to damage to the air sacs in the lungs. Continue using Spiriva , which you find beneficial.  -PROSTATE BIOPSY: Your recent prostate biopsy was negative, meaning no cancer was found, and you have had no complications or symptoms following the procedure.  INSTRUCTIONS: Please follow up with a chest x-ray to rule out pneumonia. Start taking doxycycline  and prednisone  as prescribed. I will notify Dr. Geronimo about your elevated LDH levels. Monitor your response to the treatment and report back if there is no improvement.

## 2024-04-01 NOTE — Telephone Encounter (Signed)
 Ashlyn please let patient know CXR showed subtle pneumonia right lung. Ask that he stat taking prednisone  and doxy. FU in 4-6 weeks with repeat CXR

## 2024-04-01 NOTE — Telephone Encounter (Signed)
 ATC X1. LMTCB

## 2024-04-02 NOTE — Telephone Encounter (Signed)
 ATC X1 LMTCB. I am unable to reach pt. I will send him a mychart message then completing note per protocol.

## 2024-04-06 ENCOUNTER — Other Ambulatory Visit: Payer: Self-pay | Admitting: Internal Medicine

## 2024-04-06 DIAGNOSIS — J84112 Idiopathic pulmonary fibrosis: Secondary | ICD-10-CM

## 2024-04-07 ENCOUNTER — Telehealth: Payer: Self-pay

## 2024-04-07 DIAGNOSIS — J84112 Idiopathic pulmonary fibrosis: Secondary | ICD-10-CM

## 2024-04-07 MED ORDER — PIRFENIDONE 801 MG PO TABS: 801.0000 mg | ORAL_TABLET | Freq: Three times a day (TID) | ORAL | 1 refills | Status: DC

## 2024-04-07 MED ORDER — PIRFENIDONE 801 MG PO TABS: 801.0000 mg | ORAL_TABLET | Freq: Three times a day (TID) | ORAL | 0 refills | Status: DC

## 2024-04-07 NOTE — Telephone Encounter (Signed)
 Spoke with patient regarding pirfenidone . Last LFTs in Feb 2025 and is overdue  LFTs completed in June 2025 for research protocol 02/25/2024 AST 38 Alkaline phosphatase 49    Reginald Davis, PharmD, MPH, BCPS, CPP Clinical Pharmacist (Rheumatology and Pulmonology)

## 2024-04-07 NOTE — Telephone Encounter (Signed)
 Pt is requesting pirfenidone  refill, could you please advise. Thank you.

## 2024-04-07 NOTE — Addendum Note (Signed)
 Addended by: DAYNE SHERRY RAMAN on: 04/07/2024 03:41 PM   Modules accepted: Orders

## 2024-04-09 ENCOUNTER — Ambulatory Visit (INDEPENDENT_AMBULATORY_CARE_PROVIDER_SITE_OTHER): Admitting: Podiatry

## 2024-04-09 ENCOUNTER — Ambulatory Visit (INDEPENDENT_AMBULATORY_CARE_PROVIDER_SITE_OTHER)

## 2024-04-09 DIAGNOSIS — S99921A Unspecified injury of right foot, initial encounter: Secondary | ICD-10-CM

## 2024-04-09 DIAGNOSIS — M629 Disorder of muscle, unspecified: Secondary | ICD-10-CM

## 2024-04-09 NOTE — Progress Notes (Signed)
  Subjective:  Patient ID: Reginald Davis, male    DOB: July 08, 1946,  MRN: [id number]  Chief Complaint  Patient presents with   Foot Injury    Right foot. A week ago while stepping off a curb he landed hard on right foot. Felt a pulling sensation. Has had swelling and pain. No discoloration. Pain is primarily on the plantar surface of the heel. Had steroids due to lung issue. This actually helped with the inflammation. He is still having soreness and difficulty with WB and ROM.     Discussed the use of AI scribe software for clinical note transcription with the patient, who gave verbal consent to proceed.  History of Present Illness Reginald Davis is a 78 year old male who presents with heel pain after a fall.  Approximately one week ago, he slipped off a curb while carrying a small dog crate and landed hard on his extended foot. He did not fall but may have twisted his ankle slightly. Since then, he experiences pain in the bottom of the heel, worsened by weight-bearing activities, causing a limp and avoidance of treadmill use.  Swelling in the heel is noted, particularly in the mornings. Swelling and pain increased after completing a course of prednisone , but pain was slightly better this morning compared to the previous day.  He has not been icing the heel but has avoided treadmill and stationary bike use to prevent aggravation. Current medications include prednisone  and doxycycline , completed for pneumonia.      Objective:    Physical Exam General: AAO x3, NAD  Dermatological: Skin is warm, dry and supple bilateral.  There are no open sores, no preulcerative lesions, no rash or signs of infection present.  Vascular: Dorsalis Pedis artery and Posterior Tibial artery pedal pulses are 2/4 bilateral with immedate capillary fill time.  There is no pain with calf compression, swelling, warmth, erythema.   Neruologic: Grossly intact via light touch bilateral.   Musculoskeletal: There is  tenderness palpation along the medial band of plantar fascia just distal to the heel.  Unable to appreciate any area pinpoint tenderness.  No pain with direct pressure to the calcaneus.  There is no send edema today.  No erythema.  MMT 5/5.  Gait: Unassisted, Nonantalgic.     No images are attached to the encounter.    Results RADIOLOGY Foot X-ray: No acute fracture; calcaneus intact; on the lateral view on the fibula there is actual avulsion most present on prior x-ray as well.   Assessment:   1. Nontraumatic tear of plantar fascia      Plan:  Patient was evaluated and treated and all questions answered.  Assessment and Plan Assessment & Plan Right plantar fascia and likely muscle injury - Apply ice 20 minutes, 2-3 times daily. - Use frozen water bottle to roll area. - Apply Voltaren gel or topical anti-inflammatories. - Dispensed a walking boot for immobilization to help facilitate soft tissue healing. - Avoid range of motion exercises until pain subsides. - Gradually reintroduce range of motion exercises as pain decreases, starting with 10 reps, 2-3 times daily. - Avoid treadmill and stationary bike until healed. - Follow-up in one month if symptoms persist or worsen. - Consider MRI if no improvement in two weeks. - Remove boot when driving.  No follow-ups on file.   Donnice JONELLE Fees DPM

## 2024-04-09 NOTE — Patient Instructions (Signed)

## 2024-04-13 ENCOUNTER — Ambulatory Visit: Payer: Medicare Other

## 2024-04-25 DIAGNOSIS — M545 Low back pain, unspecified: Secondary | ICD-10-CM | POA: Diagnosis not present

## 2024-04-25 DIAGNOSIS — R109 Unspecified abdominal pain: Secondary | ICD-10-CM | POA: Diagnosis not present

## 2024-04-27 DIAGNOSIS — M109 Gout, unspecified: Secondary | ICD-10-CM | POA: Diagnosis not present

## 2024-04-27 DIAGNOSIS — N401 Enlarged prostate with lower urinary tract symptoms: Secondary | ICD-10-CM | POA: Diagnosis not present

## 2024-04-27 DIAGNOSIS — R7301 Impaired fasting glucose: Secondary | ICD-10-CM | POA: Diagnosis not present

## 2024-04-27 DIAGNOSIS — R972 Elevated prostate specific antigen [PSA]: Secondary | ICD-10-CM | POA: Diagnosis not present

## 2024-04-27 DIAGNOSIS — E785 Hyperlipidemia, unspecified: Secondary | ICD-10-CM | POA: Diagnosis not present

## 2024-04-27 DIAGNOSIS — Z1212 Encounter for screening for malignant neoplasm of rectum: Secondary | ICD-10-CM | POA: Diagnosis not present

## 2024-05-04 DIAGNOSIS — I2583 Coronary atherosclerosis due to lipid rich plaque: Secondary | ICD-10-CM | POA: Diagnosis not present

## 2024-05-04 DIAGNOSIS — J439 Emphysema, unspecified: Secondary | ICD-10-CM | POA: Diagnosis not present

## 2024-05-04 DIAGNOSIS — E785 Hyperlipidemia, unspecified: Secondary | ICD-10-CM | POA: Diagnosis not present

## 2024-05-04 DIAGNOSIS — J8410 Pulmonary fibrosis, unspecified: Secondary | ICD-10-CM | POA: Diagnosis not present

## 2024-05-04 DIAGNOSIS — Z0000 Encounter for general adult medical examination without abnormal findings: Secondary | ICD-10-CM | POA: Diagnosis not present

## 2024-05-04 DIAGNOSIS — N401 Enlarged prostate with lower urinary tract symptoms: Secondary | ICD-10-CM | POA: Diagnosis not present

## 2024-05-04 DIAGNOSIS — R946 Abnormal results of thyroid function studies: Secondary | ICD-10-CM | POA: Diagnosis not present

## 2024-05-04 DIAGNOSIS — I447 Left bundle-branch block, unspecified: Secondary | ICD-10-CM | POA: Diagnosis not present

## 2024-05-04 DIAGNOSIS — M722 Plantar fascial fibromatosis: Secondary | ICD-10-CM | POA: Diagnosis not present

## 2024-05-04 DIAGNOSIS — R7989 Other specified abnormal findings of blood chemistry: Secondary | ICD-10-CM | POA: Diagnosis not present

## 2024-05-04 DIAGNOSIS — J9611 Chronic respiratory failure with hypoxia: Secondary | ICD-10-CM | POA: Diagnosis not present

## 2024-05-04 DIAGNOSIS — J479 Bronchiectasis, uncomplicated: Secondary | ICD-10-CM | POA: Diagnosis not present

## 2024-05-04 DIAGNOSIS — K4090 Unilateral inguinal hernia, without obstruction or gangrene, not specified as recurrent: Secondary | ICD-10-CM | POA: Diagnosis not present

## 2024-05-04 DIAGNOSIS — R82998 Other abnormal findings in urine: Secondary | ICD-10-CM | POA: Diagnosis not present

## 2024-05-05 ENCOUNTER — Ambulatory Visit: Admitting: Podiatry

## 2024-05-05 ENCOUNTER — Ambulatory Visit (INDEPENDENT_AMBULATORY_CARE_PROVIDER_SITE_OTHER)

## 2024-05-05 DIAGNOSIS — I442 Atrioventricular block, complete: Secondary | ICD-10-CM | POA: Diagnosis not present

## 2024-05-06 ENCOUNTER — Ambulatory Visit: Payer: Self-pay | Admitting: Cardiology

## 2024-05-06 LAB — CUP PACEART REMOTE DEVICE CHECK
Battery Remaining Longevity: 91 mo
Battery Remaining Percentage: 73 %
Battery Voltage: 3.01 V
Brady Statistic AP VP Percent: 1.1 %
Brady Statistic AP VS Percent: 1 %
Brady Statistic AS VP Percent: 87 %
Brady Statistic AS VS Percent: 12 %
Brady Statistic RA Percent Paced: 1.2 %
Brady Statistic RV Percent Paced: 88 %
Date Time Interrogation Session: 20250826020015
Implantable Lead Connection Status: 753985
Implantable Lead Connection Status: 753985
Implantable Lead Implant Date: 20220427
Implantable Lead Implant Date: 20220427
Implantable Lead Location: 753859
Implantable Lead Location: 753860
Implantable Pulse Generator Implant Date: 20220427
Lead Channel Impedance Value: 430 Ohm
Lead Channel Impedance Value: 460 Ohm
Lead Channel Pacing Threshold Amplitude: 0.75 V
Lead Channel Pacing Threshold Amplitude: 0.875 V
Lead Channel Pacing Threshold Pulse Width: 0.5 ms
Lead Channel Pacing Threshold Pulse Width: 0.5 ms
Lead Channel Sensing Intrinsic Amplitude: 5 mV
Lead Channel Sensing Intrinsic Amplitude: 5.6 mV
Lead Channel Setting Pacing Amplitude: 1.125
Lead Channel Setting Pacing Amplitude: 2.5 V
Lead Channel Setting Pacing Pulse Width: 0.5 ms
Lead Channel Setting Sensing Sensitivity: 2 mV
Pulse Gen Model: 2272
Pulse Gen Serial Number: 3920271

## 2024-05-07 ENCOUNTER — Ambulatory Visit (INDEPENDENT_AMBULATORY_CARE_PROVIDER_SITE_OTHER)

## 2024-05-07 ENCOUNTER — Encounter: Payer: Self-pay | Admitting: Internal Medicine

## 2024-05-07 ENCOUNTER — Ambulatory Visit (INDEPENDENT_AMBULATORY_CARE_PROVIDER_SITE_OTHER): Admitting: Internal Medicine

## 2024-05-07 VITALS — BP 142/68 | HR 62 | Temp 97.8°F | Ht 66.0 in | Wt 168.2 lb

## 2024-05-07 DIAGNOSIS — J849 Interstitial pulmonary disease, unspecified: Secondary | ICD-10-CM

## 2024-05-07 DIAGNOSIS — J189 Pneumonia, unspecified organism: Secondary | ICD-10-CM | POA: Diagnosis not present

## 2024-05-07 DIAGNOSIS — Z7185 Encounter for immunization safety counseling: Secondary | ICD-10-CM

## 2024-05-07 DIAGNOSIS — J84112 Idiopathic pulmonary fibrosis: Secondary | ICD-10-CM | POA: Diagnosis not present

## 2024-05-07 DIAGNOSIS — R0609 Other forms of dyspnea: Secondary | ICD-10-CM | POA: Diagnosis not present

## 2024-05-07 DIAGNOSIS — Z8701 Personal history of pneumonia (recurrent): Secondary | ICD-10-CM | POA: Diagnosis not present

## 2024-05-07 NOTE — Progress Notes (Signed)
 OV 08/09/2020  Subjective:  Patient ID: Reginald Davis, male , DOB: 1946/01/24 , age 78 y.o. , MRN: [id number] , ADDRESS: [address] [place] [address] PCP Shayne Anes, MD Patient Care Team: Shayne Anes, MD as PCP - General (Internal Medicine) End, Lonni, MD as PCP - Cardiology (Cardiology) Cardiologist is Dr. Woodie Moose This Provider for this visit: Treatment Team:  Attending Provider: Geronimo Amel, MD    08/09/2020 -   Chief Complaint  Patient presents with   Consult    COPD, DOE     HPI Reginald Davis 78 y.o. 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  History is given by the patient who also brought in very clearly typed notes about his history.  He says in the 1970s and 1980s while he was busy with his professional life he was a smoker and finally quit smoking in 1997.  His total pack smoking history might be 70 years.  He undergoes regular screening CT scans of the chest.  Most recently March 2021 that reported signs of emphysema and a small upper lobe nodule unchanged over many years and possible interstitial lung disease changes.  He says he was at baseline and doing well but in April 2021 he started noticing shortness of breath.  He was placed on Spiriva  which improved his symptoms daily.  After that he felt like significantly improved.  He then was dealing with significant difficult to control blood pressures with Dr. Moose.  She has increased his dosage of the blood pressure medication.  He was remaining active walking approximately thousand steps daily and 30 minutes of walking 5 more days per week.  Then in September 2020 when he and his wife traveled to [facility]  and then drove to [place] which is an elevation of 4000-4500 feet.  After that they continue to [facility] at 6500-7500 feet.  They got very short of breath.  Even had a fever episode.  His pulse oximetry dropped to 84-85.  He ended up in the emergency room  and was given a diagnosis of COPD exacerbation.  Given doxycycline  and prednisone  and then he improved.  Post improvement in September and post return to [facility] he has had difficult to control blood pressure but he does have some residual shortness of breath with exertion relieved by rest.  His apple watch has noticed desaturations at night that are transient with pulse ox ranging 85 to 96%.  His resting heart rate at bed is between 54 and 90 at night.  His current walking desaturation test is below.  He dropped only 2 points but he did get tachycardic.  His pulmonary function test show slight reduction in DLCO but otherwise okay  He follows with Dr. Moose who noticed he has left lower lobe crackles.  Due to persistent symptoms he has been referred to pulmonary clinic.  He wants to get to the bottom of the issues.  His echocardiogram shows grade 1 diastolic dysfunction.  He does suffer from some visceral obesity.   Of note he has upcoming air travel to [place] on August 15, 2020.  He is going to see his granddaughter who is not seen since the onset of the pandemic.  He believes he may not be traveling to high altitudes in [place].    ADDENDUM: There is a right lower lobe nodule that is seen between vessels in the right lower lobe on image 97 of series 8.  This nodule was compared to studies dating back to 2015 and shows no change. It is possible that this represents a lymph node along bronchovascular structures but again this is stable over a 6 year interval, obscured by surrounding vessels and bronchovascular structures.     Electronically Signed   By: Isla Blind M.D.   On: 11/27/2019 14:45    Signed by Blind Isla BRAVO, MD on 11/27/2019  2:48 PM  Narrative & Impression  CLINICAL DATA:  Left upper lobe nodules, history of bronchiectasis and emphysema.   EXAM: CT CHEST WITHOUT CONTRAST   TECHNIQUE: Multidetector CT imaging of the chest was performed following  the standard protocol without IV contrast.   COMPARISON:  Coronary CT from 08/26/2018, CT chest from 09/20/2017   FINDINGS: Cardiovascular: Calcified atherosclerotic changes in the thoracic aorta without signs of aneurysm. Limited assessment on noncontrast evaluation. The heart size is normal without pericardial effusion. Coronary artery disease with three-vessel calcification as before.   Mediastinum/Nodes: Thoracic inlet structures are normal. No axillary lymphadenopathy. Small right juxta hilar lymph node, 9 mm unchanged since January of 2019 along with other scattered lymph nodes below CT criteria for pathologic enlargement. Limited assessment of hilar structures due to lack of contrast. Esophagus is mildly patulous with some gas in the lumen.   Lungs/Pleura: Small nodule between bronchovascular structures in the central portion of the right lower lobe (image 97, series 8) 11 x 7 mm, within 1 mm of its size in 2019. No dense consolidation. Mild basilar bronchiectasis. Subpleural reticulation as before with signs of centrilobular emphysema.   Tiny nodules in the left upper lobe are stable.   Upper Abdomen: Incidentally imaged portions of upper abdominal contents are unremarkable. No evidence of acute upper abdominal process.   Musculoskeletal: No chest wall mass. Sclerosis of the right clavicular head is unchanged since 2019. Spinal degenerative changes as before.   IMPRESSION: 1. Small nodule between bronchovascular structures in the central portion of the right lower lobe measuring 11 x 7 mm, within 1 mm of its size in 2019. This nodule is within 1 mm of its size in 2019. This nodule is unchanged since 2015 and this therefore benign. 2. Signs of emphysema and chronic interstitial changes with tiny upper lobe nodules are unchanged. 3. Coronary artery disease with three-vessel calcification.   Aortic Atherosclerosis (ICD10-I70.0) and Emphysema (ICD10-J43.9).    Electronically Signed: By: Isla Blind M.D. On: 11/26/2019 09:11     08/29/2020- Interim hx Patient presents today to review testing and discuss starting anti-fiibrotic medication. CT imaging suggestive of probable UIP with progression since 2015, high suspicion towards IPF. He had negative serology. Dr. Geronimo recommend starting anti-fibrotic, either Esbriet  or OFEV. Accompanied by his wife during today's appointment. He is baseline today, shortness of breath and cough are the same as last visit. He was recently in [place] for 10 days, he did not need to take on hand doxycyline or prednisone . He had an overnight oximetry test on12/2/21 which showed <88% 5 hrs 35 mins, start 2L [place]. He has not received oxygen  yet, he was contacted by DME company. He is interested in [employer] continuous oxygen  concentrator and is willing to pay out of pocket in needed. He reports daytime oxygen  saturation runs between 87-92% RA. He is active, gets an average of 8,000 steps a day. He will be changing medicare part D plans to [employer] silver come January 1st 2022.     OV 09/29/2020  Subjective:  Patient ID: Reginald BROCKS  Davis, male , DOB: [birth year] , age 66 y.o. , MRN: [id number] , ADDRESS: [address] [place] [address] PCP Shayne Anes, MD Patient Care Team: Shayne Anes, MD as PCP - General (Internal Medicine) End, Lonni, MD as PCP - Cardiology (Cardiology)  This Provider for this visit: Treatment Team:  Attending Provider: Geronimo Amel, MD    09/29/2020 -   Chief Complaint  Patient presents with   Follow-up    6 min walk performed 09/22/20.  Pt states he feels like he has been stable since last visit. Pt is wearing 2L at night.  Pt states if he is on an incline, his sats are dropping some below 88% on room air.    IPF dx givien 08/26/20 based on probable UIP on CT + negative srology, +  age > 52, _ white + male + prior smoker + heart burn/GERD hx-> this is IPF  Associated mild  emphysema present -started Spiriva   GERD hx  Pirfenidone  start this pending January 2022/February 2022  HPI GOBLE FUDALA 78 y.o. -returns for follow-up.  He presents with his wife.  Since his last visit in the interim he saw nurse practitioner.  We gave him a diagnosis of IPF based on probable UIP on CT scan age greater than 96, Caucasian ethnicity, male gender, prior smoker, acid reflux history and negative serology.  He has accepted the diagnosis.  He presents with his wife at this point in time.  They have gone through pirfenidone  education and started paperwork.  He is waiting for co-pay approval.  At this point in time he feels stable his symptom score is below.  He is completed a 6-minute walk test but he did not desaturate below 88%.  The focus of this visit is multiple questions in general education and answering questions about idiopathic pulmonary fibrosis  -Overall his cough is improved.  He attributes his cough improvement to omeprazole .  However he wants to switch to pantoprazole .  He did some reading and he said that he is concerned about theoretical effects of omeprazole  and pirfenidone  plasma levels therefore he wants to switch to pantoprazole .  I have supported this and will do the prescription.  -He is very interested in clinical trials as a care option.  We discussed concept of clinical trials being voluntary and what care option meant.  He is probably leaning towards later phase trials.  We gave him consent form for PROMEDIOR Phase 3 and Pliant phase 2 studies but did indicate to him that there is currently a backlog and in addition he will have to be stable on antifibrotic's for a few months before he can enroll.  He is understanding of this.  -He is interested in the patient support group and I have given him the email for a local support group to the pulmonary fibrosis foundation  -He exercises regularly.  However he is interested in joining pulmonary rehabilitation a  referral has been made but he is still waiting to hear from them.  I have emailed the director to get a follow-up.  -We discussed the natural history of pulmonary fibrosis.  Especially IPF.  We discussed its progressive disease.  He and his wife wanted to know about prognostic markers.  Currently these are not commercially available.  In particular is interested in Kale-6.  Indicated that in the next few years, she will test might be available to differentiate prognosis but at this time we would follow clinically.  Currently based on current severity and  stability predicts future progression.  Also explained viral illnesses can exacerbate diseases  - Lung transplantation: He is interested in this concept.  He wants early referral to [facility] so he gets his options covered.  He knows he has to lose weight.  We discussed common barriers for lung transplantation including psychological Support, social support, financial costs.  He says he does not have any of this.  His BMI is 29 he needs to get to less than 27.  Explained to him nutrition is the way to go on this.  He is also going to do rehabilitation   - We also discussed his emphysema component being small.  He will continue with Spiriva   = Oxygenation -he is using nocturnal oxygen .  His apple watch shows his oxygen  levels to be normal now at night.  He is monitoring this.  He says that when he exerts heavily his oxygen  does drop but on the 6-minute walk test he did not drop.  He will pay for a portable oxygen  system on his own out-of-pocket.  Therefore we will do an order for this.        OV 11/10/2020  Subjective:  Patient ID: Reginald Davis, male , DOB: 19-Aug-1946 , age 79 y.o. , MRN: [id number] , ADDRESS: [address] [place] [address] PCP Shayne Anes, MD Patient Care Team: Shayne Anes, MD as PCP - General (Internal Medicine) End, Lonni, MD as PCP - Cardiology (Cardiology)  This Provider for this visit: Treatment  Team:  Attending Provider: Geronimo Amel, MD    11/10/2020 -   Chief Complaint  Patient presents with   Follow-up    Doing well    IPF dx givien 08/26/20 based on probable UIP on CT + negative srology, +  age > 87, _ white + male + prior smoker + heart burn/GERD hx-> this is IPF  -Last CT scan of the chest December 2021  - Esbriet  start date -> Oct 26, 2020   - Pulm rehab Mar 2022   Associated mild emphysema present -started Spiriva     HPI CRISTAN SCHERZER 78 y.o. -presents with his wife. This is for IPF follow-up. He finally got hold of his pirfenidone . He started this 10/26/2020. So far he is tolerating it well. Earlier this week on 11/08/2020 he started pulmonary rehabilitation. He feels stable. Wife feels he is an under perceiver. On a 6-minute walk test he dropped to as low as 89%. He had a pulmonary function test at [facility] that showed decline in DLCO. He is worried about this. We walked him again today and his pulse ox did drop to 88%. That could have been a variation in place but clearly this a difference suggesting progression. However he is tolerating his pirfenidone  fine. No side effects as evidenced below in the symptom score. He really wants to start ASAP on clinical trials. He is already met with [facility] for transplant as an option. He had to consent forms one for phase 3 IV infusion PRomedior and another for Phase 2 Pliant Part C. The latter has moved to Part D which is higher dose. He had more questions about this. We went over several components of clinical trials documented below. Explained to him about therapeutic misconception and secondary intent being therapeutic gain. Advised him that the primary intent should always be about drug development and contribution to science. He understood this. Explained to him currently there is supply chain issues with a phase 3 study. However  the phase 2 sutdy is active. However he were to wait a few months before he is  eligible for either study because he just started on pirfenidone . He understands this. We have given him an updated consent form. PFT [facility] feb 2022 FVC 3.55L DLCO 11.02    Telephone call 11/30/20   Nov 2021 -> March 2022 with us   - FVC up - dlco down  Compared to [facility] feb 2022  -March 2022 with  Us   - FVC down  - DLCO up   Overall  - would stay stable  - there is mixed signal - nothing to do for now  - allow anti fibrotic kick in  - be patient   OV 12/21/2020  Subjective:  Patient ID: Reginald Davis, male , DOB: 08/23/1946 , age 43 y.o. , MRN: [id number] , ADDRESS: [address] [place] [address] PCP Shayne Anes, MD Patient Care Team: Shayne Anes, MD as PCP - General (Internal Medicine) End, Lonni, MD as PCP - Cardiology (Cardiology)  This Provider for this visit: Treatment Team:  Attending Provider: Geronimo Amel, MD    12/21/2020 -   Chief Complaint  Patient presents with   Follow-up    PFT performed 11/25/20.  Pt states he has been doing okay and denies any complaints.     IPF dx givien 08/26/20 based on probable UIP on CT + negative srology, +  age > 40, + white + male + prior smoker + heart burn/GERD hx-> this is IPF  -Last CT scan of the chest December 2021  - Esbriet  start date -> Oct 26, 2020   - Pulm rehab Mar 2022  - Clinical trial education - Mar 2022 Shlomo D ICF given)  - PFF concept introduction - May 2022   Associated mild emphysema present -started Spiriva   Normal Echo May 2021  HPI Reginald Davis 78 y.o. -returns for follow-up with his wife.  He is now on pirfenidone  since mid 01/27/2021 and is tolerating it well.  He reports intentional weight loss.  He has some mild GI side effects that are reflected in the symptom score and this is stable.  He says with a combination of intentional weight loss and rehabilitation and oxygen  use [with exertion] his resting heart rate is now in the 60s.  He showed me his apple watch curve  about that.  He is pretty excited about that.  He has had us  for short of Covid vaccine mRNA.  He is really interested in clinical trials.  He has met with the research coordinator and is scheduled himself for Pliant study visit mid May 2022.  He has read the consent.  He is keeping up with the literature on pulmonary fibrosis.  He is pretty excited about an upcoming IND application approval for the pharmaceutical company [employer] for [employer] BUI [id number].  We will be a study site for this but probably in the fall or the winter 2022.  I told him I do not know much details about this drug as yet.  He says he will start the Pliant study currently.  He wants to know about travel.  He has some prednisone  and hand and also antibiotics.  We will go to [place] and [place].  He will take his portable oxygen  system with him.  I have approved this travel.  At room air at rest he is fine.  When he exerts himself with treadmill and exercise he uses 2-3 L pulse.  Most  recent liver function test was normal.  This was in December 12, 2020.  He is worried about his progression.  Particular [facility]'s test showed a drop.  He then had pulmonary function test with us .  I put these results together.  It appears that there is lot of fluctuation particularly with the [facility] test.  But on average it appears his DLCO is declined since he has been with us  but his FVC is stable.   He is enjoying his rehabilitation.  PFT   Subjective:  Patient ID: Reginald Davis, male , DOB: 1946-02-10 , age 45 y.o. , MRN: [id number] , ADDRESS: [address] [place] [address] PCP Shayne Anes, MD Patient Care Team: Shayne Anes, MD as PCP - General (Internal Medicine) Inocencio Soyla Lunger, MD as PCP - Electrophysiology (Cardiology) Raford Riggs, MD as PCP - Cardiology (Cardiology)  This Provider for this visit: Treatment Team:  Attending Provider: Geronimo Amel, MD    08/22/2021 -   Chief Complaint   Patient presents with   Follow-up    Pt states he has had diarrhea for the past 6 weeks and is unsure if it is due to his medication.     IPF dx givien 08/26/20 based on probable UIP on CT + negative srology, +  age > 34, + white + male + prior smoker + heart burn/GERD hx-> this is IPF  -Last CT scan of the chest December 2021  - Esbriet  start date -> Oct 26, 2020   - Pulm rehab Mar 2022  - Clinical trial education - Mar 2022 (Pliant D ICF given)     - Pliant study participant GLENWOOD buzzard 02Jun2022 and was randomized 22Jun2022  - PFF concept introduction - May 2022   Associated mild emphysema present -started Spiriva   Normal Echo May 2021  HPI Reginald Davis 78 y.o. -   IPF: [facility] visit for OPF. On esbrit and also study (IMP v placebo). Has study  visit#10 in 19Dec, then come back for EOT on 30Jan and EOS on 14Feb and then he is done.  From a respiratory standpoint his symptom scores are slowly actually improved.  His FVC also shows slow improvement over time.  In August 2022 he had a walk test at [facility] and this shows improvement in walking distance.  His weight is stable.  He had recent high-resolution CT chest.  Compared to September 2022 his ILD is stable but compared to remote past there is progression.  Overall he is pleased with the symptoms and stability.  He uses oxygen  2 L at night and 3 L pulsed with exercise and while traveling.  He continues on pirfenidone  and study drug.  He should finish the study next month or so.  Rash: Sometime around September 2022 or so he developed punctate rash in his forearms and his back.  He says this still persist but it is stable its not any worse.  He is not overly concerned about this  Main new issue is that since the last 6 weeks he has had diarrhea.  Started as mild but since then is gotten progressive.  He says currently it is severe.  He is having a lot of flatulence and when he has significant amount of flatulence he is having some  amount of stool in his underwear.  For the last 3 weeks he is taking align probiotic.  For the last 1 week he is also taking a protein shake but he says there  is no sweetener in it.  For the last 1 week he is started Imodium.  After this diarrhea is better.  He still having diarrhea 3-4 times a day.  He is taking Imodium 3-4 times a day.  The stool is definitely more formed but it still worrisome for him.  He says he is cut down on nuts and fruits.  He denies any artificial sweetener.  Did medication review for diarrhea causing drugs: The only new introduction is a study drug and prior to that was the pirfenidone .  However he is on other drugs chronically that can cause diarrhea and this includes colchicine  greater than 20%, Diovan  greater than 5% and PPI.  Initially did not complain about pain but when I asked him he said sometimes he has some right lower quadrant discomfort.  There is no fever or bloody stools.  He had recent safety labs on 08/15/2021.  I personally reviewed this and this is normal.     Upcoming travel: He is plan to go to [place] in January 2023.  This is for 1-2 weeks and is to visit his daughter and his only grandchild.  He wants his diarrhea significantly addressed before the trip.   OV 11/28/2021  Subjective:  Patient ID: Reginald Davis, male , DOB: [birth year] , age 85 y.o. , MRN: [id number] , ADDRESS: [address] [place] [address] PCP Shayne Anes, MD Patient Care Team: Shayne Anes, MD as PCP - General (Internal Medicine) Inocencio Soyla Lunger, MD as PCP - Electrophysiology (Cardiology) Raford Riggs, MD as PCP - Cardiology (Cardiology)  This Provider for this visit: Treatment Team:  Attending Provider: Geronimo Amel, MD    11/28/2021 -   Chief Complaint  Patient presents with   Follow-up    Pt states he has been doing okay since last visit.   HPI KAELOB PERSKY 78 y.o. -returns for follow-up.  Presents with his wife.  He tells me that he is doing  well overall.  After coming off the pliant study in February 2023 for 2 weeks he thought he had increased nocturnal desaturations but then this settled out and he is better.  He is intentionally lost weight.  He did go for a lung transplant evaluation at [facility] 11/01/2021.  His 6-minute walking test is improved.  His pulmonary function test also has improved [see below].  He attributes this improvement to being on the research study protocol.  We do not know if he was randomized to placebo or the actual medicine of product.  He believes he was randomized to the actual medicine product.  He did have some increased diarrhea during the study and he says this is now improved.  He is interested in more studies.  Although the current improvement in diarrhea is also because he is taking Imodium on a scheduled basis.  He continues to use 2 L at night and 3 L with exercise although 6-minute walk test had an improved distance and did not desaturate below 91% and he finished 15 laps at [facility].  He is interested in more clinical trials.  We we discussed a phase 2 oral anticollagen inhibitor study by [employer] sponsor.  He has taken the consent to review.  He is also interested in the future injection study that he came across in the Internet.  It is sponsored by [employer].  Were not a location for the study at this point in time but did indicate to him that we are in communication and  do not have much information to share with him about that.  He is going to read the Internet and let us  know what study he wants to do.  He continues to have dry skin.  We talked about flaxseed  He has tended travel to [place] in July 2023.  He wants us  Paxlovid  refilled.  He also wants doxycycline  and prednisone  refill for the trip.  I agreed to do that.    Subjective:  Patient ID: Reginald Davis, male , DOB: 02/14/1946 , age 28 y.o. , MRN: [id number] , ADDRESS: [address] [place] [address] PCP Shayne Anes,  MD Patient Care Team: Shayne Anes, MD as PCP - General (Internal Medicine) Inocencio Soyla Lunger, MD as PCP - Electrophysiology (Cardiology) Raford Riggs, MD as PCP - Cardiology (Cardiology)  This Provider for this visit: Treatment Team:  Attending Provider: Geronimo Amel, MD    03/02/2022 -   Chief Complaint  Patient presents with   Follow-up    Pt here for f/u on ILD, states breathing has been good some SOB increased coughing.      HPI JAMALL STROHMEIER 78 y.o. -returns for follow-up.  He has completed his pliant research protocol in February 2023.  He continues his as..  He uses 2 L nasal cannula at night.  He uses oxygen  with exertion.  Symptom score shows stable.  Pulmonary function test is stable.  However he feels that at night his Apple Watch on 2 L nasal cannula showing more desaturations than usual.  We did discuss about getting this formally tested with a overnight pulse oximetry to see if he would need more oxygen .  He is agreeable to this plan.  I did indicate to him that given stability in symptoms and pulmonary function test and the walking desaturation test  -pretest probably due for him desaturating at night more than usual would be low.  Is also willing to get echocardiogram and a BNP check.  Depending on this we might have to consider right heart catheterization if for WHO group 3 pulmonary hypertension is in the differential diagnosis.  But overall I think the probably of pulmonary hypertension is also low given the stability.   He continues to be interested in research protocols.  He is particularly interested in a study called  MOONSCAPE -which involves the STAT3 pathway against IPF and it would involve a subcutaneous injection.  He is reading a consent form.  He is willing to participate if he passes on prescreen.    He also believes that his acid reflux is slightly more active.  He is taking his PPI.  He is doing all the other measures.  But did notice that he is  taking fish oil.  He takes this for his hyperlipidemia.  I did indicate to him side effect of fish oil as acid reflux.  He is now on Praluent  for his hyperlipidemia which is working really well.  Therefore he is willing to stop the fish oil.   In terms of dry skin: He takes some flaxseed he is putting a new lotion and this is better.  In terms of travel he is going to go to [place] again to see his grandchild.  This will be in July 2023.  In terms of his complete heart block: He is on pacer but he believes the pacer is more active now.  OV 06/07/2022  Subjective:  Patient ID: Reginald Davis, male , DOB: 04/19/1946 , age 39 y.o. ,  MRN: [id number] , ADDRESS: [address] [place] [address] PCP Shayne Anes, MD Patient Care Team: Shayne Anes, MD as PCP - General (Internal Medicine) Inocencio Soyla Lunger, MD as PCP - Electrophysiology (Cardiology) Raford Riggs, MD as PCP - Cardiology (Cardiology)  This Provider for this visit: Treatment Team:  Attending Provider: Geronimo Amel, MD    06/07/2022 -   Chief Complaint  Patient presents with   Follow-up    PFT was cancelled by pt.  Pt states he has had a few episodes where he has noticed lower O2 sats than before which was seen on walk test done by [facility]. Pt also states he has had a little more coughing than usual.     HPI Reginald Davis 78 y.o. -returns for follow-up this is standard of care visit.  I saw him 2 days ago as a research visit.  He enrolled into a new study.  The study involved a subcutaneous injection he signed consent.  He had screening pulmonary function test documented below.  For the first time his FEV1 FVC ratio is below 70.  He did stop Spiriva  before the breathing test based on protocol requirements.  In review of his pulmonary function test he had end of August another pulmonary function test at [facility] and even that the ratio has gone down.  Prior to that his FEV1 FVC ratio is always over 70.  The first  time this is gone down.  I did indicate to him I do not understand why but I have a few patients this is happened.  He feels stable.  His 6-minute walk test at [facility] was stable.  His symptom scores are stable.  Uses oxygen  with exercise.  His wife wanted me to caution him not to over exercise and desaturate too much.  I did tell him to keep his pulse ox over 88%.  He is very interested in clinical trials but he understands that if his FEV1 FVC ratio is less than 70 he will disqualify for many trials.  He says he has come to terms with this.  He is on Esbriet  and is tolerating this well without any problems.  He did have [facility] lung transplant visit I reviewed those notes.  They feel he is too early for transplant.    OV 08/27/2022  Subjective:  Patient ID: Reginald Davis, male , DOB: 02/20/1946 , age 37 y.o. , MRN: [id number] , ADDRESS: [address] [place] [address] PCP Shayne Anes, MD Patient Care Team: Shayne Anes, MD as PCP - General (Internal Medicine) Inocencio Soyla Lunger, MD as PCP - Electrophysiology (Cardiology) Raford Riggs, MD as PCP - Cardiology (Cardiology)  This Provider for this visit: Treatment Team:  Attending Provider: Geronimo Amel, MD    08/27/2022 -   Chief Complaint  Patient presents with   Follow-up    Follow up for IPF. Pt states he has felt stable since last visit. Pt states he is back on his full dose of pirfenidone  now and is tolerating it well. Pt is also on Spirivia daily and no issues noted with that and albuterol  as needed. Pt states he has not felt any issues with his breathing since last visit Pt also had PFT on 12/13    HPI AISEA BOULDIN 78 y.o. -returns for standard of care visit.  In this visit he tells me that the back spasm he had he went to physical therapy and they found out that he had a little bit  of right shoulder droop from wearing his portable oxygen  this then created contralateral stretch.  Then after intense physical  therapy is better.  He feels this is also helped his lung function.  At last visit he was also having diarrhea.  We stopped the pirfenidone  but the diarrhea persisted.  We did extensive stool studies and it was normal.  I referred him to Dr. Belvie Just, GI and right before he saw Dr. Just he was actually taking Xifaxan which helped resolve the diarrhea.  He was on pirfenidone  holiday from early October 2023 through early November 2023 and he has slowly escalated his pirfenidone  back up to the full dose.  Initially he had some diarrhea upon starting pirfenidone  but now he is tolerating it well.  He is on full dose.  Through all this since September he is lost another 4 pounds of weight.  He feels that when he was off his pirfenidone  his weight loss has stabilized.  Currently the BMI is 24.4 and he is at ideal body weight.  He is also exercising aggressively and he attributes some of the weight loss because of this.  He keeps his oxygen  levels greater than 88%.  He has a new year pulse oximeter that he allows him to continuously track his pulse ox.  He is up-to-date with his respiratory vaccines.  He is driving to [place]  tomorrow to be with his daughter who is visiting him from [place] for Christmas.  He is still interested in research protocols.  He disqualified from a previous research study because of low T1/FVC ratio screening.  Currently standard of care rate she was fine and DLCO is also stable.  He wants to see if he will rescreen.  He is also interested in the study run by [employer] at another site.  He is going to send me some information to read about that particular product.    PFT  OV 12/20/2022  Subjective:  Patient ID: Reginald Davis, male , DOB: [birth year] , age 89 y.o. , MRN: [id number] , ADDRESS: [address] [place] [address] PCP Shayne Anes, MD Patient Care Team: Shayne Anes, MD as PCP - General (Internal Medicine) Inocencio Soyla Lunger, MD as PCP -  Electrophysiology (Cardiology) Raford Riggs, MD as PCP - Cardiology (Cardiology)  This Provider for this visit: Treatment Team:  Attending Provider: Geronimo Amel, MD  12/20/2022 -   Chief Complaint  Patient presents with   Follow-up    Evelyn f/u,      HPI Reginald Davis 78 y.o. -returns for follow-up.  Is a 77-month follow-up.  He believes he is overall stable.  But he is worried that he is desaturating a little bit more easily.  He states that 1 time when he walked downhill after walking the dog his pulse ox dropped.  Sometimes at night he is now needing 3 L nasal cannula to avoid transient desaturations into 89%.  He is also noticed some desaturations when he walks.  This is through the EAR pulse ox oximeter probe..  However objectively there is no evidence that he is getting worse.  His symptom scores remained stable [he believes he might be under perceiving his symptoms].  His pulmonary function test also is stable [see below].  His walking desaturation test is also stable along with a 6-minute walk test at [facility].  We took a shared decision making for him to do and an exercise distance to desaturation at time to desaturation test on  his treadmill at the same particular pace and the same incline and measure this every 2 weeks every 4 weeks is a good objective way to monitor if he is desaturating and getting worse or not.  He is going to do that.     Meanwhile he continues to be interested in research protocols.  He is registered with [facility] and is looking at a protocol with [employer].  He had screen failed with us  because of FEV1 FVC ratio being low.  He says at the location his PFT ratio was normal and he might screen passed.  He did show his liver function test from that visit and it was normal.  This was in the first week of April 2024.  He also showed a CT report.  There is saying there is early evidence of honeycombing.  However there is no comparison.  I  did indicate to him that the report could be inaccurate but also could mean that his IPF is evolving and he is getting honeycombing now.  Regardless his symptoms and pulmonary function test and walk test are stable.  He visited Dr. Hildegard 11/02/2022 at [facility] lung transplant clinic.  For the disease was going very slow progression.  And he might not need a lung transplant given his age and slow progression.  He showed a letter dated 11/14/2022 where at this point in time they are putting a pause on his transplant evaluation  He plans to travel to the [place] end of May 2024: He he wants to take emergency pack of Paxlovid , doxycycline  and prednisone .  We will do this for him.    OV 04/22/2023  Subjective:  Patient ID: Reginald Davis, male , DOB: 1946/02/24 , age 72 y.o. , MRN: [id number] , ADDRESS: [address] [place] [address] PCP Shayne Anes, MD Patient Care Team: Shayne Anes, MD as PCP - General (Internal Medicine) Inocencio Soyla Lunger, MD as PCP - Electrophysiology (Cardiology) Raford Riggs, MD as PCP - Cardiology (Cardiology)  This Provider for this visit: Treatment Team:  Attending Provider: Geronimo Amel, MD   04/22/2023 -   Chief Complaint  Patient presents with   Follow-up    4 months f/up, no complaints     HPI Reginald Davis 78 y.o. -returns for follow-up.  Presents with his wife Aleck.  She is an independent historian.  After the last visit the trip to [place] got canceled because Aleck developed non-COVID respiratory viral infection and was very congested.  This then reduce his exercise.  He was also at the beach last week with his family.  His daughter from [place] and his son-in-law visited them.  Also his daughter from near [place] visited them.  He said because of all this he has gained some weight but he is trying to lose it.  His shortness of breath itself is stable.  At home he monitors his distance to desaturation.  On his treadmill with a  speed of 2.6 on a incline of 0 he desaturates on 1 minute.  He feels this is stable.  However he is concerned that his 6-minute walk distance has dropped.  In February it was 1728 feet at [facility].  Currently in June 2024 is 1535 feet.  The latter was done at [facility] chest.  He says the hallway was crowded and there were people there and was not a straight line.  Nevertheless his pulse ox did go down to 87%.  He showed me  those results.  He also showed his pulmonary function test from [facility] chest as part of the research protocol.  His FVC stable but his DLCO might have declined.  Overall this conflicting data about his decline.  His symptom score and assistance of desaturation at home are stable but his DLCO seems a little worse and then his 6-minute walk test is also a little bit worse but these are all done at different institutions in different set up.  His current therapy is generic pirfenidone  along with the [employer] study drug.  He believes he might be getting placebo although he is blinded because he does not have any side effects.   -His FVC in April 2024 3.6 at the study site.  In July 2024 was 3.78.  His DLCO in June 2024 was 12.28.  I reviewed external records for this that he showed me.'   OV 07/23/2023  Subjective:  Patient ID: Reginald Davis, male , DOB: 1946-06-22 , age 74 y.o. , MRN: [id number] , ADDRESS: [address] [place] [id number] PCP Shayne Anes, MD Patient Care Team: Shayne Anes, MD as PCP - General (Internal Medicine) Inocencio Soyla Lunger, MD as PCP - Electrophysiology (Cardiology) Raford Riggs, MD as PCP - Cardiology (Cardiology)  This Provider for this visit: Treatment Team:  Attending Provider: Geronimo Amel, MD    07/23/2023 -   Chief Complaint  Patient presents with   Follow-up    Breathing is stable today. He has days that he feels like his breathlessness is getting worse. He has occ cough- prod with gray sputum.       HPI BERTRUM HELMSTETTER 78 y.o. -presents for follow-up with his wife.  He is currently on pirfenidone  and tolerating it well overall..  Main issue is that he is concerned that his pulmonary fibrosis/IPF is slowly getting worse.  He showed spirometry data from the recent facility in [place].  There is a decline in FVC.  He has not had a DLCO of the study since April 2024.  He is also not had a repeat CT chest of 6-minute walk test.  These are pending at some point in time later the study.  He is taking study drug.  He is tolerating this fine.  His symptom score shows continued stability.  In addition his sit/stand exercise hypoxemia test appears adequate.  He did show exercise hypoxemia test at home but 5 minutes into the treadmill he desaturated to 89% this was on 07/18/2023.  There is quite a bit of variation.  Sometimes he is going to 1/2 minutes before desaturate and then 1 to have any personal breathing it took him 20 minutes to desaturate to 89%.  Because of his concerns of shortness of breath he did have a cardiac stress test in October 2024.  I reviewed that report.  He is got a good ejection fraction and it is basically normal.   - Other issues  - he continues to have fashion related issues particularly his low back and is working with physical therapy for that which is helping him. - Therapeutic monitoring.  Safety labs and research are normal according to his history and the sheet that he showed me.  He also had blood work with Dr. Shayne on 07/19/2023.  He brought the review of charts with him.  His LFT is normal.  And to assess creatinine. -He has mild associated emphysema.  He is on Spiriva .  He wants an alternative.  I restarted him to find  out which alternative might be cheaper through his insurance.  This would be  INcruse or Tudorza     OV 11/04/2023  Subjective:  Patient ID: Reginald Davis, male , DOB: [birth year] , age 38 y.o. , MRN: [id number] , ADDRESS: [address] [place]  [place] [id number] PCP Shayne Anes, MD Patient Care Team: Shayne Anes, MD as PCP - General (Internal Medicine) Inocencio Soyla Lunger, MD as PCP - Electrophysiology (Cardiology) Raford Riggs, MD as PCP - Cardiology (Cardiology)  This Provider for this visit: Treatment Team:  Attending Provider: Geronimo Amel, MD  11/04/2023 -   Chief Complaint  Patient presents with   Follow-up     HPI Reginald Davis 78 y.o. -presents for followup with wife Andruw Battie. LAst visit Nov 2024. HE coninues on esbriet . He continues on BMS study drug v Placebo at [facility]. In summer 2025 he will roll over to OLE. There might be a CT chest at at that time (he will find out). HE is tolerating the study drug well. Tolerating esbriet  as well. HOwever, he feels he is declinng. HIs PFTs show that but DLCO decline > FVC. He says that when he does TMT at home at 2. on RA at 0% incline -> he desaturates at 2 minues . Previously was much better.. Also feels he needs o2 during talking HE has dropped o2 when he watches TV. When he goes to mail box and back he frequently desaturates Night he uses 3L [place] and his apple iOX does not desaturate. Last echo summer 2023  Other issues  - LDH at research ws slighty high but now better - K 5.8 and then 5.6 few days ago. -> I agreed to recheck - PSA was high at with PCP 4.65 in May 2025 and 5.5 in Nov 2024 - wants recheck  - VIt D preivsly low -> on replacement through me. No recheck so far   03/31/2024- Interim hx  Discussed the use of AI scribe software for clinical note transcription with the patient, who gave verbal consent to proceed.  History of Present Illness   JARRIN STALEY is a 78 year old male with idiopathic pulmonary fibrosis and emphysema who presents with increased cough and fever.  He has experienced a significant increase in his usual cough, which has doubled in frequency, particularly at night, and has become more productive. This change began during a  recent trip to [place] and [place] . The cough is now producing grayish-tan mucus, which is a change from his typical symptoms.  He experienced a low-grade fever last night, which was higher this morning but has since returned to normal. A home COVID test was negative.  He reports diarrhea several times this morning.  There is an increase in shortness of breath. He uses supplemental oxygen  primarily when talking, as he finds it difficult to talk and breathe simultaneously without it.  His current medications include pirfenidone  801 mg three times a day, Spiriva  once daily at dinner, and Mucinex  twice daily. He has emergency prescriptions for doxycycline  and prednisone , which he has never used and is unsure of the indications for their use.  He is participating in a clinical trial with [employer] and has noted a recent spike in his lactate dehydrogenase (LDH) levels. He had a prostate biopsy on March 03, 2024, which was negative and without complications.       OV 05/07/2024  Subjective:  Patient ID: Reginald Davis, male , DOB: 12/26/1945 , age 79  y.o. , MRN: [id number] , ADDRESS: [address] [place] [address] PCP Shayne Anes, MD Patient Care Team: Shayne Anes, MD as PCP - General (Internal Medicine) Inocencio Soyla Lunger, MD as PCP - Electrophysiology (Cardiology) Raford Riggs, MD as PCP - Cardiology (Cardiology)  This Provider for this visit: Treatment Team:  Attending Provider: Geronimo Amel, MD    05/07/2024 -   Chief Complaint  Patient presents with   Medical Management of Chronic Issues   Interstitial Lung Disease    Breathing has improved some but not back to baseline. He is coughing with moderate amount of yellow sputum.       IPF dx givien 08/26/20 based on probable UIP on CT + negative srology, +  age > 59, + white + male + prior smoker + heart burn/GERD hx-> this is IPF  -Last CT scan of the chest December 2022  - Esbriet  start date -> Oct 26, 2020   - Pulm rehab Mar 2022  - Clinical trial education - Mar 2022 (Pliant D ICF given)     - Pliant study participant - onsent 02Jun2022 and was randomized 22Jun2022 -0> completed early (feb) 2023   - [facility] SQ Vixarelimab study - Screen failed due to low ratio in fev1/fvc   -He is on [employer] study drug through [facility] chest.    - completed 52 wees summer 2025  - PFF concept introduction - May 2022   -National conference November 2023     Associated mild emphysema present -started Spiriva   Normal Echo May 2021 and summer 2023  WEight loss  April 2022: 171#  June 2023: 162#  SEpt 2023: 158# - bmi 25.14 Aug 2022: 154#- bmi 24.13 December 2022-155 pounds  Aug 2024: 164#  Feb 2025: 169#  Aug 2025: 168#    Esbriet /Pirfenidone  requires intensive drug monitoring due to high concerns for Adverse effects of , including  Drug Induced Liver Injury, significant GI side effects that include but not limited to Diarrhea, Nausea, Vomiting,  and other system side effects that include Fatigue, headaches, weight loss and other side effects such as skin rash. These will be monitored with  blood work such as LFT initially once a month for 6 months and then quarterly  #Screening for pulmonary hypertension'  -No evidence of pulmonary hypertension on echo March 2025  #Radiologic screening - Most recent CT scan of the chest at [facility] February 27, 2024 with ILD and emphysema.  No change in ILD compared to April 2024.  HPI PRATYUSH AMMON 78 y.o. -returns for follow-up with his wife Aleck.  Saw nurse practitioner March 31, 2024.  He seems to have had fever cough and change in sputum.  Chest x-ray showed right middle zone infiltrates [I personally visualized and not sure] but treated with doxycycline  and he is back to baseline now.  Overall feeling baseline.  He continues his pirfenidone .  He is also taking his study drug.  He is finished 52 weeks.  He supposed to go on open  label extension but he says that he will just still be on the blinded portion.  This did not make sense to me and I told him to double check.  He had his data from the research protocol.  His spirometry and DLCO last checked in June 2025 is stable compared to May 2025.  He did have a CT scan of the chest while the PFT showed continued decline over the last 1 year the  CT scan is reported to be stable but he is not confident about that result.  He is going to bring the CD-ROM here and we will uploaded into our system.   He had safety labs through primary care/research and I reviewed this from August 2025.  It is normal.   He wanted to know what his pneumococcal vaccination status and I gave an update on that.  SYMPTOM SCALE - ILD 09/29/2020  11/10/2020 Now on esbriet  sinc 10/26/20 12/21/2020 esbriet  + 171# 08/22/2021 Esbriet  and Pliant study drug , 173# 11/28/2021 Esbriet , off plaint study, 2L [place] night, 3L [place] exercise, 163# intential weight loss 03/02/2022 162# night o2 , ex o2 subjectively 06/07/2022 Uses exercise oxygen  08/27/2022 154#  - diarrhea sept/oc and took esbriet  holiday early oc-nov 2023 and needex xifaxan. BAck on esbrie at time of this visit 12/20/2022  04/22/2023 Generic pirfenidone  _ BMS study drug vial [facility] 07/23/2023 Generic esbreit + BMS study drug 11/04/2023 Genreic esbriet  + BMS study drug 05/07/2024 Generic esbriet  + BMS stidy drug (placebo v IP)  O2 use ra  ra a t rest 2-3L with exercise pulsed 2L [place] at night, 3L pulsed during exerise and travels           Shortness of Breath 0 -> 5 scale with 5 being worst (score 6 If unable to do)              At rest 0.5 0 0.5 1 1 1  0.5 1 1  0/5 1 1  1.5  Simple tasks - showers, clothes change, eating, shaving 1 00 0 0 1 1 1 1 1 0  1 1 1.5  Household (dishes, doing bed, laundry) 1 0 0.5 1 2  1.5 1 2 2 1 2 2  1.5  Shopping 1 0 0.5 1 2  1.5 1 1.5 1 1 1 1  1.5  Walking level at own pace 1.5 1 1  0 2 2 2 2 2 1 1 2 2   Walking up Stairs *2.25 2 2  2 3 3 2  2.5 3 1.5 2 2 3   Total (30-36) Dyspnea Score 7.25 3 6 5 11 10  7.5 10 10 5 8 9 11   How bad is your cough? Improved with ppi 1 1 ` 1 1 0.5 1 1 1 1 1 2   How bad is your fatigue 0 0 0.5 ` 1 1 1 1 1  0 1 1 0  How bad is nausea 0 0 0 0 0 0 0 0 0 0  0 0 0  How bad is vomiting?  0 00 0 0 0 0 0 0 1 0  0 0 0  How bad is diarrhea? 0.5 coffee 0 0.25 1 1  0 2.5 1 0 0 0 0 0  How bad is anxiety? 0.5 0 0.5 1 0 0 0.5 0 0 0 00 0 0  How bad is depression 0 0 0 0 0 0 0 0  0 0 1 0   Walk test Simple office walk 185 feet x  3 laps goal with forehead probe 08/09/2020  [facility] 89% and HR111. 1296 fet. 0L o2 needed. Pl JR 111/ done 10/24/20  08/22/2021 - at [facility] 05/01/21 - walked 1671 feet wihout o2. This is an imprement  03/02/2022  163# -11/01/2021 [facility] the 6-minute walk and completed 174 6 feet.  Lowest pulse ox was 91%.  Heart rate was 107.  Did 15 labs.  Did not need oxygen . Aug 2023 at [facility] feet .  Did not need o2 08/27/2022  12/20/2022 11/01/2022 6-minute walk test at [facility] 1728 feet/116 % predicted.  Lowest pulse ox was 91% on room air.  Maximum heart rate was 111 February 26, 2023 research at [facility] chest 6-minute walk test -> 468 m [8464 feet].  Started at 92% ended up at 87%.  This was not a straight hallway and may need zigzag 09/17/2023 [facility]: 564 metere, lowest pulse ox 85% 07/23/2023   O2 used ra ra ra ra  ra ra   ra  Number laps completed 3 3 3 3  3 3    Sist and stand x 15  Comments about pace normal nL          Resting Pulse Ox/HR 96% and 80/min 96% and 90.min 99% and 60 97% and HR 62  100% and HR 68 95% and HR 67   98% and HR 61  Final Pulse Ox/HR 94% and 90/min 88% and 97/min 95% and 94 94% and HR 84  97% and HR 106 92% and HR 72   93% and HR 70  Desaturated </= 88% no yes yno no  no no     Desaturated <= 3% points no Yes, 8 points Yes, 4 Yes, 3 points  Yes, 3 points Yes 3 points     Got Tachycardic >/= 90/min yes yes yes no  yes yes     Symptoms  at end of test dyspnea Some dyspnea Mild dyspea none  none no     Miscellaneous comments x   Bris pad  stable            SIT STAND TEST - goal 15 times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

## 2024-05-07 NOTE — Patient Instructions (Addendum)
 ICD-10-CM   1. ILD (interstitial lung disease) (HCC)  J84.9     2. Dyspnea on exertion  R06.09       #IPF: This is slowly progressive over time (Based on PFT)   -  but good news that radiology at [place] [facility] thought it was stable since 2024 -> 2025 & Sit/stand test 05/07/2024  Very similar to Nov 2024 - -  You are on max therapy - On generic pirfenidone  + BMS study drug ; tolerating both well without side effect and possibly +/-  rolling over to Open Label.  Recent recent safety labs at [facility] chest are normal  #Echo march 2025 No evoidence of pulmonary hypertension  #history of pneumonia March 31, 2024  - seems resolved on cxr 05/07/2024 . Glad you are feeling better  Plan - get CD Rom of CT chest from [facility] (2025) and we can upload into our PACS and compare to past one   - continue pirfenidone  as before - continue study drug [employer] through [place] [facility]]   - double check if OLE means real drug or you wil still be on placebo v real drug - Safety monitoring through recent labs at [facility] -Continue portable oxygen  with exertion - Continue nighttime oxygen  [we have sent an order for [employer] to have them supply nighttime oxygen ]  - continue daily exercise - keep pulse ox  >-= 88%  - once every few weeks - measure time or distance to desaturation on room air by walking at same pace and incline at treadmill  -If new drug NERANDRILOMLAST is approved we will discuss thiat at nnext visi   #Pneumonia vaccine counseling  You had Prvnar 13 in 2014 and 2015  You had pnemovax in 2002 and 2011 You had Penumoccal Conjugate-20 (prevnar 20) in 2022  Plan  - you are fully uptodate with pneumococcal vaccine  Follow-up - At 47-month standard of care visit with Dr. Geronimo 30 minutes-  -Symptom questionnaire and walk test at follow-up

## 2024-05-08 ENCOUNTER — Telehealth: Payer: Self-pay

## 2024-05-08 NOTE — Telephone Encounter (Signed)
 I have sent the DME company to cancel ONO order. NFN

## 2024-05-08 NOTE — Telephone Encounter (Signed)
 Dr Geronimo, okay to cancel ONO?

## 2024-05-08 NOTE — Telephone Encounter (Signed)
 Yes is fine to canel the order

## 2024-05-08 NOTE — Telephone Encounter (Signed)
 Please cancel ONO order.

## 2024-05-12 DIAGNOSIS — Z1211 Encounter for screening for malignant neoplasm of colon: Secondary | ICD-10-CM | POA: Diagnosis not present

## 2024-05-19 ENCOUNTER — Ambulatory Visit: Payer: Self-pay | Admitting: Primary Care

## 2024-05-25 NOTE — Progress Notes (Signed)
 Remote PPM Transmission

## 2024-05-28 ENCOUNTER — Other Ambulatory Visit (HOSPITAL_BASED_OUTPATIENT_CLINIC_OR_DEPARTMENT_OTHER): Payer: Self-pay

## 2024-05-28 DIAGNOSIS — H40051 Ocular hypertension, right eye: Secondary | ICD-10-CM | POA: Diagnosis not present

## 2024-05-28 DIAGNOSIS — Z23 Encounter for immunization: Secondary | ICD-10-CM | POA: Diagnosis not present

## 2024-05-28 DIAGNOSIS — H40002 Preglaucoma, unspecified, left eye: Secondary | ICD-10-CM | POA: Diagnosis not present

## 2024-05-28 MED ORDER — COMIRNATY 30 MCG/0.3ML IM SUSY
0.3000 mL | PREFILLED_SYRINGE | Freq: Once | INTRAMUSCULAR | 0 refills | Status: AC
  Filled 2024-05-28: qty 0.3, 1d supply, fill #0

## 2024-06-11 DIAGNOSIS — Z23 Encounter for immunization: Secondary | ICD-10-CM | POA: Diagnosis not present

## 2024-06-13 DIAGNOSIS — M545 Low back pain, unspecified: Secondary | ICD-10-CM | POA: Diagnosis not present

## 2024-06-13 DIAGNOSIS — R109 Unspecified abdominal pain: Secondary | ICD-10-CM | POA: Diagnosis not present

## 2024-06-15 ENCOUNTER — Encounter: Payer: Self-pay | Admitting: Internal Medicine

## 2024-06-15 DIAGNOSIS — R0609 Other forms of dyspnea: Secondary | ICD-10-CM

## 2024-06-15 DIAGNOSIS — I2510 Atherosclerotic heart disease of native coronary artery without angina pectoris: Secondary | ICD-10-CM

## 2024-06-22 MED ORDER — TIOTROPIUM BROMIDE 18 MCG IN CAPS
1.0000 | ORAL_CAPSULE | Freq: Every day | RESPIRATORY_TRACT | 6 refills | Status: DC
Start: 2024-06-22 — End: 2024-08-05

## 2024-06-22 NOTE — Telephone Encounter (Signed)
 Pt calling this am to follow up on this request for his  tiotropium (SPIRIVA  HANDIHALER) 18 MCG inhalation capsule  This should go to  [id number] - [place], [place] - [address] AT [facility] OF [address]

## 2024-06-30 ENCOUNTER — Inpatient Hospital Stay
Admission: RE | Admit: 2024-06-30 | Discharge: 2024-06-30 | Disposition: A | Discharge status: Home / Self Care | Payer: Self-pay | Source: Ambulatory Visit | Attending: Internal Medicine | Admitting: Internal Medicine

## 2024-06-30 ENCOUNTER — Telehealth: Payer: Self-pay | Admitting: Internal Medicine

## 2024-06-30 DIAGNOSIS — J849 Interstitial pulmonary disease, unspecified: Secondary | ICD-10-CM

## 2024-06-30 NOTE — Telephone Encounter (Signed)
 Outside CT image order placed  I have filled out the PACS form and placed this with disc in interoffice envelope addressed to [place]  I asked that the disc be returned to us  for tracking purposes  Routing to me for continued followup

## 2024-06-30 NOTE — Telephone Encounter (Signed)
 LEslie    I am giving you 06/30/2024 8:40 AM  Hassan Devine;s CD ROM . He dropped it off 05/19/24. Please send to PACS for uploading. Let me know when sent    SIGNATURE    Dr. Dorethia Cave, M.D., F.C.C.P,  Pulmonary and Critical Care Medicine Staff Physician, [facility] Center Director - Interstitial Lung Disease  Program  Pulmonary Fibrosis [facility] at [facility] [place], [place], [address]   Pager: [phone], If no answer  -> Check AMION or Try [phone] Telephone (clinical office): [phone] Telephone (research): [phone]  8:40 AM 06/30/2024

## 2024-07-09 NOTE — Telephone Encounter (Signed)
 I received disc back from [place]- it has been uploade. Routing this msg to MR as FYI that this was taken care of. Pt has ov with MR 11/4- will hold to give back to pt.

## 2024-07-11 DIAGNOSIS — M545 Low back pain, unspecified: Secondary | ICD-10-CM | POA: Diagnosis not present

## 2024-07-11 DIAGNOSIS — R109 Unspecified abdominal pain: Secondary | ICD-10-CM | POA: Diagnosis not present

## 2024-07-13 ENCOUNTER — Ambulatory Visit: Payer: Medicare Other

## 2024-07-14 ENCOUNTER — Ambulatory Visit (INDEPENDENT_AMBULATORY_CARE_PROVIDER_SITE_OTHER): Admitting: Internal Medicine

## 2024-07-14 ENCOUNTER — Encounter: Payer: Self-pay | Admitting: Internal Medicine

## 2024-07-14 ENCOUNTER — Telehealth: Payer: Self-pay | Admitting: Internal Medicine

## 2024-07-14 VITALS — BP 128/70 | HR 72 | Temp 97.8°F | Ht 66.0 in | Wt 176.6 lb

## 2024-07-14 DIAGNOSIS — J439 Emphysema, unspecified: Secondary | ICD-10-CM | POA: Diagnosis not present

## 2024-07-14 DIAGNOSIS — Z950 Presence of cardiac pacemaker: Secondary | ICD-10-CM

## 2024-07-14 DIAGNOSIS — J849 Interstitial pulmonary disease, unspecified: Secondary | ICD-10-CM

## 2024-07-14 DIAGNOSIS — J4489 Other specified chronic obstructive pulmonary disease: Secondary | ICD-10-CM

## 2024-07-14 DIAGNOSIS — J84112 Idiopathic pulmonary fibrosis: Secondary | ICD-10-CM

## 2024-07-14 DIAGNOSIS — R0602 Shortness of breath: Secondary | ICD-10-CM

## 2024-07-14 DIAGNOSIS — Z5181 Encounter for therapeutic drug level monitoring: Secondary | ICD-10-CM | POA: Diagnosis not present

## 2024-07-14 DIAGNOSIS — R0609 Other forms of dyspnea: Secondary | ICD-10-CM | POA: Diagnosis not present

## 2024-07-14 LAB — CBC WITH DIFFERENTIAL/PLATELET
Basophils Absolute: 0.1 K/uL (ref 0.0–0.1)
Basophils Relative: 0.7 % (ref 0.0–3.0)
Eosinophils Absolute: 0.3 K/uL (ref 0.0–0.7)
Eosinophils Relative: 3.1 % (ref 0.0–5.0)
HCT: 43.3 % (ref 39.0–52.0)
Hemoglobin: 14.7 g/dL (ref 13.0–17.0)
Lymphocytes Relative: 23.3 % (ref 12.0–46.0)
Lymphs Abs: 2 K/uL (ref 0.7–4.0)
MCHC: 33.9 g/dL (ref 30.0–36.0)
MCV: 100.6 fl — ABNORMAL HIGH (ref 78.0–100.0)
Monocytes Absolute: 0.8 K/uL (ref 0.1–1.0)
Monocytes Relative: 8.9 % (ref 3.0–12.0)
Neutro Abs: 5.6 K/uL (ref 1.4–7.7)
Neutrophils Relative %: 64 % (ref 43.0–77.0)
Platelets: 303 K/uL (ref 150.0–400.0)
RBC: 4.31 Mil/uL (ref 4.22–5.81)
RDW: 13.6 % (ref 11.5–15.5)
WBC: 8.7 K/uL (ref 4.0–10.5)

## 2024-07-14 NOTE — Telephone Encounter (Signed)
        For radiology addendum:   - please call radiologist assistant line [phone] and specify MARVELL TAMER , 1946/04/11 and [id number] - mention imaging type HRCT and date summer 2025 done outside - request addendum for purpose of read the outside CT and giving comparions   Please send phone message back when done  Thanks    SIGNATURE    Dr. Dorethia Cave, M.D., F.C.C.P,  Pulmonary and Critical Care Medicine Staff Physician, [facility] Center Director - Interstitial Lung Disease  Program  Pulmonary Fibrosis [facility] at [facility] [place], [place], [address]  Pager: [phone], If no answer  OR between  19:00-7:00h: page [phone] Telephone (clinical office): [phone] Telephone (research): [phone]  1:33 PM 07/14/2024

## 2024-07-14 NOTE — Telephone Encounter (Signed)
 Reginald Davis and Reginald Davis   OMEED OSUNA -is reporting more shortness of breath.  It could be because of worsening IPF but he and he is also gained weight.  However I do want you to evaluate if he has pulmonary hypertension or i if he has significant chronotropic incompetence.  He is seeing both a few on the same day in a few weeks  Today and checking a BNP and D-dimer  Last echocardiogram was in spring 2025   Thank you so much      SIGNATURE    Dr. Dorethia Cave, M.D., F.C.C.P,  Pulmonary and Critical Care Medicine Staff Physician, [facility] Director - Interstitial Lung Disease  Program  Pulmonary Fibrosis [facility] at [facility] [place], [place], [address]   Pager: [phone], If no answer  -> Check AMION or Try [phone] Telephone (clinical office): [phone] Telephone (research): [phone]  1:40 PM 07/14/2024

## 2024-07-14 NOTE — Progress Notes (Signed)
 OV 05/07/2024  Subjective:  Patient ID: Reginald Davis, male , DOB: [birth year] , age 78 y.o. , MRN: [id number] , ADDRESS: [address] [place] [address] PCP Shayne Anes, MD Patient Care Team: Shayne Anes, MD as PCP - General (Internal Medicine) Inocencio Soyla Lunger, MD as PCP - Electrophysiology (Cardiology) Raford Riggs, MD as PCP - Cardiology (Cardiology)  This Provider for this visit: Treatment Team:  Attending Provider: Geronimo Amel, MD    05/07/2024 -   Chief Complaint  Patient presents with   Medical Management of Chronic Issues   Interstitial Lung Disease    Breathing has improved some but not back to baseline. He is coughing with moderate amount of yellow sputum.       IPF dx givien 08/26/20 based on probable UIP on CT + negative srology, +  age > 85, + white + male + prior smoker + heart burn/GERD hx-> this is IPF  -Last CT scan of the chest December 2022  - Esbriet  start date -> Oct 26, 2020   - Pulm rehab Mar 2022  - Clinical trial education - Mar 2022 (Pliant D ICF given)     - Pliant study participant - onsent 02Jun2022 and was randomized 22Jun2022 -0> completed early (feb) 2023   - [facility] SQ Vixarelimab study - Screen failed due to low ratio in fev1/fvc   -He is on [employer] study drug through [facility] chest.    - completed 52 wees summer 2025  - PFF concept introduction - May 2022   -National conference November 2023     Associated mild emphysema present -started Spiriva   Normal Echo May 2021 and summer 2023  WEight loss  April 2022: 171#  June 2023: 162#  SEpt 2023: 158# - bmi 25.14 Aug 2022: 154#- bmi 24.13 December 2022-155 pounds  Aug 2024: 164#  Feb 2025: 169#  Aug 2025: 168#    Esbriet /Pirfenidone  requires intensive drug monitoring due to high concerns for Adverse effects of , including  Drug Induced Liver Injury, significant GI side effects that include but not limited to Diarrhea, Nausea,  Vomiting,  and other system side effects that include Fatigue, headaches, weight loss and other side effects such as skin rash. These will be monitored with  blood work such as LFT initially once a month for 6 months and then quarterly  #Screening for pulmonary hypertension'  -No evidence of pulmonary hypertension on echo March 2025  #Radiologic screening - Most recent CT scan of the chest at [facility] chest February 27, 2024 with ILD and emphysema.  No change in ILD compared to April 2024.  HPI Reginald Davis 78 y.o. -returns for follow-up with his wife Reginald Davis.  Saw nurse practitioner March 31, 2024.  He seems to have had fever cough and change in sputum.  Chest x-ray showed right middle zone infiltrates [I personally visualized and not sure] but treated with doxycycline  and he is back to baseline now.  Overall feeling baseline.  He continues his pirfenidone .  He is also taking his study drug.  He is finished 52 weeks.  He supposed to go on open label extension but he says that he will just still be on the blinded portion.  This did not make sense to me and I told him to double check.  He had his data from the research protocol.  His spirometry and DLCO last checked in June 2025 is stable compared to May 2025.  He did  have a CT scan of the chest while the PFT showed continued decline over the last 1 year the CT scan is reported to be stable but he is not confident about that result.  He is going to bring the CD-ROM here and we will uploaded into our system.   He had safety labs through primary care/research and I reviewed this from August 2025.  It is normal.   He wanted to know what his pneumococcal vaccination status and I gave an update on that.    OV 07/14/2024  Subjective:  Patient ID: Reginald Davis, male , DOB: [birth year] , age 61 y.o. , MRN: [id number] , ADDRESS: [address] [place] [address] PCP Shayne Anes, MD Patient Care Team: Shayne Anes, MD as PCP - General (Internal  Medicine) Inocencio Soyla Lunger, MD as PCP - Electrophysiology (Cardiology) Raford Riggs, MD as PCP - Cardiology (Cardiology)  This Provider for this visit: Treatment Team:  Attending Provider: Geronimo Amel, MD    07/14/2024 -   Chief Complaint  Patient presents with   Interstitial Lung Disease    Pt states since LOV breathing has been stable SOB w exertion also with any activity ex: talking he becomes breathless Prod cough (phlegm gray or cream color) Dry cough occasionally      HPI Reginald Davis 78 y.o. -Reginald Davis is a 78 year old male with idiopathic pulmonary fibrosis who presents with increased shortness of breath.  Presents with his wife Reginald Davis who is an independent historian. He has experienced increased shortness of breath compared to his last visit, becoming breathless even while talking. He uses supplemental oxygen  more frequently, especially in the evenings while sitting and watching TV. He has gained approximately four pounds since his last visit, which he attributes to reduced physical activity due to a foot injury, although he has recently resumed his exercise routine. He experiences frequent desaturation to 87-88% while sitting in his easy chair at night. His last echocardiogram was in the spring, and he has a history of emphysema, which may be contributing to his symptoms. He uses Spiriva  for COPD management and has noticed increased phlegm production and a deeper cough over the past few months.   Consistent with his reports of worsening dyspnea his dyspnea score is worse but his exercise hypoxemia test appears to be same although he is more dyspneic.  He did do a 6-minute walk test at the research center on room air [documented below] I reviewed external records for this.  He is not happy with the quality of 6-minute walk test done at the research center but comparing the one from summer 2024 his distance only off by 30 m or so.  He is currently taking Esbriet   and a study drug, which remains blinded.  This is through [facility] chest.  His pulmonary function tests have shown a slight decline over time, with the most recent DLCO test conducted in June and a walk test in September. The walk test was redone due to protocol deviation as he wore oxygen  during the initial test. Distances apear very similar to a year ago  He is sensitive to elevated PM2.5 levels and monitors these environmental factors closely. He takes a baby aspirin  daily. No leg swelling.     SYMPTOM SCALE - ILD 09/29/2020  11/10/2020 Now on esbriet  sinc 10/26/20 12/21/2020 esbriet  + 171# 08/22/2021 Esbriet  and Pliant study drug , 173# 11/28/2021 Esbriet , off plaint study, 2L [place] night, 3L [place] exercise, 163# intential weight  loss 03/02/2022 162# night o2 , ex o2 subjectively 06/07/2022 Uses exercise oxygen  08/27/2022 154#  - diarrhea sept/oc and took esbriet  holiday early oc-nov 2023 and needex xifaxan. BAck on esbrie at time of this visit 12/20/2022  04/22/2023 Generic pirfenidone  _ BMS study drug vial [facility] 07/23/2023 Generic esbreit + BMS study drug 11/04/2023 Genreic esbriet  + BMS study drug 05/07/2024 Generic esbriet  + BMS stidy drug (placebo v IP) 07/14/2024 Generic pirfenidone  plus BMS study drug [placebo versus IP]  O2 use ra  ra a t rest 2-3L with exercise pulsed 2L [place] at night, 3L pulsed during exerise and travels            Shortness of Breath 0 -> 5 scale with 5 being worst (score 6 If unable to do)               At rest 0.5 0 0.5 1 1 1  0.5 1 1  0/5 1 1  1.5 1  Simple tasks - showers, clothes change, eating, shaving 1 00 0 0 1 1 1 1 1 0  1 1 1.5 1.5  Household (dishes, doing bed, laundry) 1 0 0.5 1 2  1.5 1 2 2 1 2 2  1.5 3  Shopping 1 0 0.5 1 2  1.5 1 1.5 1 1 1 1  1.5 2.5  Walking level at own pace 1.5 1 1  0 2 2 2 2 2 1 1 2 2  3  Walking up Stairs *2.25 2 2 2 3 3 2  2.5 3 1.5 2 2 3 4   Total (30-36) Dyspnea Score 7.25 3 6 5 11 10  7.5 10 10 5 8 9 11 19   How bad is your cough? Improved with  ppi 1 1 ` 1 1 0.5 1 1 1 1 1 2  1.5  How bad is your fatigue 0 0 0.5 ` 1 1 1 1 1  0 1 1 0 1  How bad is nausea 0 0 0 0 0 0 0 0 0 0  0 0 0 0  How bad is vomiting?  0 00 0 0 0 0 0 0 1 0  0 0 0 0  How bad is diarrhea? 0.5 coffee 0 0.25 1 1  0 2.5 1 0 0 0 0 0 0  How bad is anxiety? 0.5 0 0.5 1 0 0 0.5 0 0 0 00 0 0 0  How bad is depression 0 0 0 0 0 0 0 0  0 0 1 0 0   Walk test Simple office walk 185 feet x  3 laps goal with forehead probe 08/09/2020  [facility] 89% and HR111. 1296 fet. 0L o2 needed. Pl JR 111/ done 10/24/20  08/22/2021 - at [facility] I 05/01/21 - walked 1671 feet wihout o2. This is an imprement  03/02/2022  163# -11/01/2021 [facility] [employer] the 6-minute walk and completed 1746 feet.  Lowest pulse ox was 91%.  Heart rate was 107.  Did 15 labs.  Did not need oxygen . Aug 2023 at [facility] - 1734 feet [578 m]. Did not need o2 08/27/2022  12/20/2022 11/01/2022 6-minute walk test at [facility] 1728 feet [576 m]/116 % predicted.  Lowest pulse ox was 91% on room air.  Maximum heart rate was 111 February 26, 2023 research at [facility] chest 6-minute walk test -> 468 m [8464 feet].  Started at 92% ended up at 87%.  This was not a straight hallway and may need zigzag 09/17/2023 [place] chest: 564 metere, lowest pulse ox 85% 07/14/2024   O2  used ra ra ra ra  ra ra   Room air  Number laps completed 3 3 3 3  3 3    Sit and stand Test x 15 times.  Comments about pace normal nL        Completed this and 32.8 seconds  Resting Pulse Ox/HR 96% and 80/min 96% and 90.min 99% and 60 97% and HR 62  100% and HR 68 95% and HR 67   96% with a heart rate of 74 and a score of 4  Final Pulse Ox/HR 94% and 90/min 88% and 97/min 95% and 94 94% and HR 84  97% and HR 106 92% and HR 72   96% with a heart rate of 94 and a dyspnea score of 7  Desaturated </= 88% no yes yno no  no no   93% with a heart rate of 77 and the dyspnea score of 5.5  Desaturated <= 3% points no Yes, 8 points Yes, 4 Yes, 3 points  Yes, 3 points Yes 3  points     Got Tachycardic >/= 90/min yes yes yes no  yes yes     Symptoms at end of test dyspnea Some dyspnea Mild dyspea none  none no     Miscellaneous comments x   Bris pad  stable    Desaturation levels appear to be the same as before        SIT STAND TEST - goal 15 times   05/07/2024 624 meters - needed 5L  (first time neeed o2 - 02/25/24) .07/14/2024 6-minute walk test done 05/18/2024 at [facility] chest on room air forted and 444 m.  Started with pulse ox of 95% and completed with a pulse ox of 88% baseline heart rate 87 end of test heart rate 70  O2 used ra ra  PRobe - finter or forehead finger Finger  Number sit and stand completed - goal 15 15 15  times  Time taken to complete 40 sec 32.8 seconds  Resting Pulse Ox/HR/Dyspnea  95% and 64/min and dyspnea of 1/10  96% with heart rate of 74 and dyspnea score of 4  Peak measures 96 % and 87/min and dyspnea of 3/10 96% with a heart rate of 94 and dyspnea score of 7  Final Pulse Ox/HR 92% and 69/min and dyspnea of 3/10 93% with a heart rate of 7700 dyspnea score of 5.5  Desaturated </= 88% no no  Desaturated <= 3% points yes no  Got Tachycardic >/= 90/min No but almsot yes  Miscellaneous comments x Worsening dyspnea score for similar performance and similar levels of oxygen  drop         Latest Ref Rng & Units 09/092025 reearch [id number] researc 01/09/24 [facility] researc 10/23/23 [facility]  Research 09/17/2023 [facility] Reseach 06/19/23 [facility] Research 02/26/23  Research at [facility] 12/20/2022   12:19 PM 08/22/2022    8:53 AM 03/02/2022    8:49 AM 11/25/2020    8:52 AM 07/12/2020   11:53 AM  PFT Results  FVC-Pre L 3.22 3.27 3.23 3.45 3.58 3.35 3.92 3.73  3.94  3.89  3.43  3.36   FVC-Predicted Pre %    98.5%    105  111  108  94  88   FVC-Post L            3.41   FVC-Predicted Post %            90   Pre FEV1/FVC % %  73  70  71  74  72   Post FEV1/FCV % %            74   FEV1-Pre L        2.73  2.76  2.75  2.53  2.41   FEV1-Predicted Pre %        107   109  106  97  88   FEV1-Post L            2.51   DLCO uncorrected ml/min/mmHg        15.84  16.25  14.76  13.56  16.88   DLCO UNC% %        71  73  66  60  73   DLCO corrected ml/min/mmHg  12.53 x x 11.92 x x 16.03  16.25  14.84  13.45  16.88   DLCO COR %Predicted %  56.47%      72  73  67  60  73   DLVA Predicted %        74  71  66  66  80   TLC L            5.39   TLC % Predicted %            83   RV % Predicted %            65       PFT     Latest Ref Rng & Units 12/20/2022   12:19 PM 08/22/2022    8:53 AM 03/02/2022    8:49 AM 11/25/2020    8:52 AM 07/12/2020   11:53 AM  PFT Results  FVC-Pre L 3.73  3.94  3.89  3.43  3.36   FVC-Predicted Pre % 105  111  108  94  88   FVC-Post L     3.41   FVC-Predicted Post %     90   Pre FEV1/FVC % % 73  70  71  74  72   Post FEV1/FCV % %     74   FEV1-Pre L 2.73  2.76  2.75  2.53  2.41   FEV1-Predicted Pre % 107  109  106  97  88   FEV1-Post L     2.51   DLCO uncorrected ml/min/mmHg 15.84  16.25  14.76  13.56  16.88   DLCO UNC% % 71  73  66  60  73   DLCO corrected ml/min/mmHg 16.03  16.25  14.84  13.45  16.88   DLCO COR %Predicted % 72  73  67  60  73   DLVA Predicted % 74  71  66  66  80   TLC L     5.39   TLC % Predicted %     83   RV % Predicted %     65        LAB RESULTS last 96 hours No results found.       has a past medical history of Burn, Coronary artery disease, Diverticul disease small and large intestine, no perforati or abscess, Exertional dyspnea (06/24/2023), GERD (gastroesophageal reflux disease), Hernia, History of nuclear stress test, Hyperlipidemia, Low back pain, Oxygen  toxicity, Plantar fasciitis, Retinal tear, Right rotator cuff tear, and Shingles.   reports that he quit smoking about 28 years ago. His smoking use included cigarettes. He started smoking about 48 years ago. He has a 40 pack-year smoking history. He  has never used smokeless tobacco.  Past Surgical History:  Procedure Laterality Date    EYE SURGERY     PACEMAKER IMPLANT N/A 01/04/2021   Procedure: PACEMAKER IMPLANT;  Surgeon: Inocencio Soyla Lunger, MD;  Location: [facility] INVASIVE CV LAB;  Service: Cardiovascular;  Laterality: N/A;   REFRACTIVE SURGERY     retina repair and retina tear   SHOULDER ARTHROSCOPY     SHOULDER SURGERY     TONSILLECTOMY      No Known Allergies  Immunization History  Administered Date(s) Administered   Fluad Quad(high Dose 65+) 06/07/2022   Fluzone Influenza virus vaccine,trivalent (IIV3), split virus 06/01/2010, 06/07/2011, 05/27/2012   INFLUENZA, HIGH DOSE SEASONAL PF 06/15/2015, 06/05/2016, 06/08/2017, 06/07/2018   Influenza Split 09/22/2009, 07/28/2013   Influenza, Quadrivalent, Recombinant, Inj, Pf 06/07/2018, 06/12/2019, 06/22/2020, 06/01/2021, 06/06/2023   Influenza,inj,Quad PF,6+ Mos 06/18/2014   Moderna Covid-19 Vaccine  Bivalent Booster 80yrs & up 06/14/2021, 01/05/2022   PFIZER(Purple Top)SARS-COV-2 Vaccination 10/05/2019, 10/27/2019, 06/03/2020   PNEUMOCOCCAL CONJUGATE-20 02/26/2021   Pfizer(Comirnaty )Fall Seasonal Vaccine 12 years and older 01/04/2023, 05/30/2023, 12/11/2023, 05/28/2024   Pneumococcal Conjugate-13 08/12/2013, 06/13/2014   Pneumococcal Polysaccharide-23 11/04/2000, 09/22/2009, 08/16/2010   Respiratory Syncytial Virus Vaccine ,Recomb Aduvanted(Arexvy ) 05/21/2022   Td 09/10/1998, 09/22/2009, 12/23/2019   Tdap 10/02/2010   Typhoid Inactivated 09/09/2015   Zoster Recombinant(Shingrix) 01/13/2017, 04/26/2017   Zoster, Live 09/16/2007, 09/22/2009    Family History  Problem Relation Age of Onset   Hyperlipidemia Mother    Diabetes Father    Heart disease Father    Coronary artery disease Brother      Current Outpatient Medications:    Alirocumab  (PRALUENT ) 150 MG/ML SOAJ, Inject 150 mg as directed every 30 (thirty) days. , Disp: , Rfl:    allopurinol  (ZYLOPRIM ) 100 MG tablet, Take 1 tablet by mouth 2 (two) times daily., Disp: , Rfl:    Alpha-Lipoic Acid 200 MG  CAPS, Take 1 capsule by mouth in the morning and at bedtime., Disp: , Rfl:    aspirin  81 MG tablet, Take 81 mg by mouth daily., Disp: , Rfl:    atorvastatin  (LIPITOR) 20 MG tablet, Take 10 mg by mouth at bedtime., Disp: , Rfl:    Cholecalciferol (VITAMIN D3) 1.25 MG (50000 UT) CAPS, Take 1 capsule (1.25 mg total) by mouth every 30 (thirty) days., Disp: 12 capsule, Rfl: 11   Coenzyme Q10 300 MG CAPS, Take 1 capsule by mouth every morning., Disp: , Rfl: 0   colchicine  0.6 MG tablet, Take 1 tablet by mouth 2 (two) times daily as needed (gout flare)., Disp: , Rfl:    dorzolamide -timolol  (COSOPT ) 22.3-6.8 MG/ML ophthalmic solution, Place 1 drop into the right eye 2 (two) times daily., Disp: , Rfl:    fluorometholone (FML) 0.1 % ophthalmic suspension, Place 1 drop into the right eye daily., Disp: , Rfl:    Guaifenesin  1200 MG TB12, Take 1,200 mg by mouth 2 (two) times daily., Disp: , Rfl:    latanoprost (XALATAN) 0.005 % ophthalmic solution, Place 1 drop into the right eye at bedtime., Disp: , Rfl:    Multiple Vitamins-Minerals (CENTRUM SILVER PO), Take 1 tablet by mouth every evening., Disp: , Rfl:    OXYGEN , Inhale 2 L into the lungs., Disp: , Rfl:    Pirfenidone  801 MG TABS, Take 1 tablet (801 mg total) by mouth in the morning, at noon, and at bedtime., Disp: 270 tablet, Rfl: 1   Polyethyl Glycol-Propyl Glycol (SYSTANE) 0.4-0.3 % SOLN, Apply 1 drop to eye daily as needed (dry eyes).,  Disp: , Rfl:    Tiotropium Bromide (SPIRIVA  HANDIHALER) 18 MCG CAPS, Place 1 capsule into inhaler and inhale daily at 2 PM., Disp: 30 capsule, Rfl: 6   triamcinolone cream (KENALOG) 0.1 %, Apply topically in the morning and at bedtime., Disp: , Rfl:    valACYclovir (VALTREX) 1000 MG tablet, Take 1 tablet by mouth daily as needed (fever blisters)., Disp: , Rfl:    valsartan  (DIOVAN ) 320 MG tablet, TAKE 1 TABLET (320 MG TOTAL) BY MOUTH EVERY EVENING., Disp: 90 tablet, Rfl: 3   vitamin C (ASCORBIC ACID ) 500 MG tablet, Take  500 mg by mouth every evening., Disp: , Rfl:    albuterol  (VENTOLIN  HFA) 108 (90 Base) MCG/ACT inhaler, Inhale 1-2 puffs into the lungs every 6 (six) hours as needed for wheezing or shortness of breath. (Patient not taking: Reported on 07/14/2024), Disp: 18 g, Rfl: 1   Loperamide-Simethicone 2-125 MG TABS, Take 1 tablet by mouth as needed. Up to 4 times a day. (Patient not taking: Reported on 07/14/2024), Disp: , Rfl:    XIFAXAN 550 MG TABS tablet, Take 550 mg by mouth 3 (three) times daily. PRN (Patient not taking: Reported on 07/14/2024), Disp: , Rfl:       Objective:   Vitals:   07/14/24 1300  BP: 128/70  Pulse: 72  Temp: 97.8 F (36.6 C)  TempSrc: Oral  SpO2: 95%  Weight: 176 lb 9.6 oz (80.1 kg)  Height: 5' 6 (1.676 m)    Estimated body mass index is 28.5 kg/m as calculated from the following:   Height as of this encounter: 5' 6 (1.676 m).   Weight as of this encounter: 176 lb 9.6 oz (80.1 kg).  @WEIGHTCHANGE @  [employer]   07/14/24 1300  Weight: 176 lb 9.6 oz (80.1 kg)     Physical Exam   General: No distress. Looks well. BUT GAINED WEIGHT O2 at rest: no Cane present: no Sitting in wheel chair: no Frail: no Obese: no Neuro: Alert and Oriented x 3. GCS 15. Speech normal Psych: Pleasant Resp:  Barrel Chest - no.  Wheeze - no, Crackles - YES, No overt respiratory distress CVS: Normal heart sounds. Murmurs - no ABD: Visceral obesity + Ext: Stigmata of Connective Tissue Disease - no HEENT: Normal upper airway. PEERL +. No post nasal drip        Assessment/     Assessment & Plan IPF (idiopathic pulmonary fibrosis) (HCC)  Encounter for therapeutic drug monitoring  DOE (dyspnea on exertion)  Pacemaker  COPD with chronic bronchitis and emphysema (HCC)  Definite worsening of subjective shortness of breath but the exercise hypoxemia test seems very similar to last visit.  In addition his 6-minute walk test distance lost his around 30 m or so.  Not much  decline.  I really do not know what is going on if it is worsening IPF or some other etiology such as development of pulmonary hypertension or worsening slight COPD flareup [he does have a increased cough for the last few weeks] a weight gain or worsening diastolic dysfunction.  He definitely is not anemic based on recent labs.  We need to explore all this.  PLAN Patient Instructions     ICD-10-CM   1. IPF (idiopathic pulmonary fibrosis) (HCC)  J84.112 B Nat Peptide    D-Dimer, Quantitative    CBC w/Diff    2. Encounter for therapeutic drug monitoring  Z51.81 B Nat Peptide    D-Dimer, Quantitative    CBC w/Diff  3. DOE (dyspnea on exertion)  R06.09 B Nat Peptide    D-Dimer, Quantitative    CBC w/Diff    4. Pacemaker  Z95.0 B Nat Peptide    D-Dimer, Quantitative    CBC w/Diff    5. COPD with chronic bronchitis and emphysema (HCC)  J44.89 CBC w/Diff   J43.9         #IPF: This is slowly progressive over time (Based on PFT)   -  You are on max therapy - On generic pirfenidone  + BMS study drug  v placebo;   Recent recent safety labs at [facility] chest are normal  Plan  - Continue both pirfenidone  and study drug - Do check with the study sponsor if you are allowed to take standard of care Nerandomilast on top of the above protocol - I will do a CT scan comparison now that we have your outside CT uploaded  #Worsening shortness of breath  -Unclear if this because of weight gain or stiff heart muscle or incipient heart failure incipient blood clot or what we call chronotropic incompetence in the setting of pacemaker or worsening fibrosis  - echo march 2025 No evoidence of pulmonary hypertension  Plan  - We will get our radiologist to compare your outside CT scan from summer 2025 to previous ones - Check blood BNP and D-dimer and CBC with differential 07/14/2024 - I have written to both Dr. Inocencio and Dr. Annabella Scarce to look at chronotropic incompetence and evaluate for  potential pulmonary hypertension -Continue portable oxygen  with exertion - Continue nighttime oxygen    Associated emphysema  -Noticed reports of slightly increased cough in the last 1 month  Plan - Try Breztri 2 puff 2 times daily instead of Spiriva  for the next few weeks and see if this helps  Follow-up - At 71-month standard of care visit with Dr. Geronimo 30 minutes-  -Symptom questionnaire and walk test at follow-up  - If I am not available then at least see the nurse practitioner    FOLLOWUP    Return for 6-8 weeks with Dr. Geronimo or nurse practitioner.  ( Level 05 visit E&M 2024: Estb >= 40 min n  visit type: on-site physical face to visit  in total care time and counseling or/and coordination of care by this undersigned MD - Dr Dorethia Geronimo. This includes one or more of the following on this same day 07/14/2024: pre-charting, chart review, note writing, documentation discussion of test results, diagnostic or treatment recommendations, prognosis, risks and benefits of management options, instructions, education, compliance or risk-factor reduction. It excludes time spent by the CMA or office staff in the care of the patient. Actual time 55 min)   SIGNATURE    Dr. Dorethia Geronimo, M.D., F.C.C.P,  Pulmonary and Critical Care Medicine Staff Physician, [facility] Center Director - Interstitial Lung Disease  Program  Pulmonary Fibrosis [facility] at [facility] [place], [place], [address]  Pager: [phone], If no answer or between  15:00h - 7:00h: call [phone] Telephone: [phone]  2:22 PM 11/4/2025circumstances

## 2024-07-14 NOTE — Patient Instructions (Addendum)
 ICD-10-CM   1. IPF (idiopathic pulmonary fibrosis) (HCC)  J84.112 B Nat Peptide    D-Dimer, Quantitative    CBC w/Diff    2. Encounter for therapeutic drug monitoring  Z51.81 B Nat Peptide    D-Dimer, Quantitative    CBC w/Diff    3. DOE (dyspnea on exertion)  R06.09 B Nat Peptide    D-Dimer, Quantitative    CBC w/Diff    4. Pacemaker  Z95.0 B Nat Peptide    D-Dimer, Quantitative    CBC w/Diff    5. COPD with chronic bronchitis and emphysema (HCC)  J44.89 CBC w/Diff   J43.9         #IPF: This is slowly progressive over time (Based on PFT)   -  You are on max therapy - On generic pirfenidone  + BMS study drug  v placebo;   Recent recent safety labs at [facility] chest are normal  Plan  - Continue both pirfenidone  and study drug - Do check with the study sponsor if you are allowed to take standard of care Nerandomilast on top of the above protocol - I will do a CT scan comparison now that we have your outside CT uploaded  #Worsening shortness of breath  -Unclear if this because of weight gain or stiff heart muscle or incipient heart failure incipient blood clot or what we call chronotropic incompetence in the setting of pacemaker or worsening fibrosis  - echo march 2025 No evoidence of pulmonary hypertension  Plan  - We will get our radiologist to compare your outside CT scan from summer 2025 to previous ones - Check blood BNP and D-dimer and CBC with differential 07/14/2024 - I have written to both Dr. Inocencio and Dr. Annabella Scarce to look at chronotropic incompetence and evaluate for potential pulmonary hypertension -Continue portable oxygen  with exertion - Continue nighttime oxygen    Associated emphysema  -Noticed reports of slightly increased cough in the last 1 month  Plan - Try Breztri 2 puff 2 times daily instead of Spiriva  for the next few weeks and see if this helps  Follow-up - At 39-month standard of care visit with Dr. Geronimo 30 minutes-  -Symptom  questionnaire and walk test at follow-up  - If I am not available then at least see the nurse practitioner

## 2024-07-14 NOTE — Telephone Encounter (Signed)
 Reginald Davis  Pls confirmin  you gave disc back to patient     SIGNATURE    Dr. Dorethia Cave, M.D., F.C.C.P,  Pulmonary and Critical Care Medicine Staff Physician, [facility] Center Director - Interstitial Lung Disease  Program  Pulmonary Fibrosis [facility] Network at [facility] [place], [place], [address]   Pager: [phone], If no answer  -> Check AMION or Try [phone] Telephone (clinical office): [phone] Telephone (research): [phone]  6:04 PM 07/14/2024

## 2024-07-15 ENCOUNTER — Ambulatory Visit: Payer: Self-pay | Admitting: Internal Medicine

## 2024-07-15 DIAGNOSIS — J84112 Idiopathic pulmonary fibrosis: Secondary | ICD-10-CM

## 2024-07-15 LAB — D-DIMER, QUANTITATIVE: D-Dimer, Quant: 0.47 ug{FEU}/mL (ref <0.50)

## 2024-07-15 LAB — BRAIN NATRIURETIC PEPTIDE: Pro B Natriuretic peptide (BNP): 25 pg/mL (ref 0.0–100.0)

## 2024-07-15 NOTE — Telephone Encounter (Signed)
 Called [facility] [facility] spoke to Luke R who states outside images have not been loaded into PACS yet. Confirmed that request has been made.

## 2024-07-15 NOTE — Telephone Encounter (Signed)
 Nothing further needed

## 2024-07-15 NOTE — Progress Notes (Signed)
 Norml d-dimer rules out blood clot.

## 2024-07-15 NOTE — Telephone Encounter (Signed)
 IT is actually loaded and I Can see it

## 2024-07-17 NOTE — Telephone Encounter (Signed)
 Confirmed I gave disc to pt.

## 2024-07-20 NOTE — Progress Notes (Signed)
 Dear Mr Miller, thanks for your detailed note. Agree the choice of stopping study drug v stopping esbriet  and adding Nerandromilst or just switching to NErandromilast and keeping study drug are all tough choices to  make. Insurance is giving a hard time for nerandromilast. So, I suggest we wait on that till new year.  Let us  see what cardiolog says - I think though BNP/D-dimer are normal might be worth pursuing a Right Heart Cath to see if you have developed Pumoary Hypertension. I did give them heads up on that  Best  Dr JONELLE

## 2024-07-29 ENCOUNTER — Other Ambulatory Visit: Payer: Self-pay | Admitting: *Deleted

## 2024-07-29 NOTE — Telephone Encounter (Signed)
 Copied from CRM [id number]. Topic: Clinical - Medication Question >> Jul 29, 2024  8:05 AM Ismael A wrote: Reason for CRM: patient was given samples of Breztri Aerospear 160 mcg - 4.8 mcg/inh - 2 puffs 2x daily - patient is now requesting to have a prescription issued  Plan: - Try Breztri 2 puff 2 times daily instead of Spiriva  for the next few weeks and see if this helps   Follow-up - At 46-month standard of care visit with Dr. Geronimo 30 minutes-             -Symptom questionnaire and walk test at follow-up             - If I am not available then at least see the nurse practitioner       FOLLOWUP      Return for 6-8 weeks with Dr. Geronimo or nurse practitioner.

## 2024-07-31 ENCOUNTER — Telehealth: Payer: Self-pay

## 2024-07-31 NOTE — Telephone Encounter (Signed)
 Copied from CRM #[id number]. Topic: Clinical - Medication Question >> Jul 31, 2024  1:21 PM Isabell A wrote: Reason for CRM: Patient would like to know if Dr.Ramaswamy will prescribe Breztri .   Callback number: [phone]  DUPLICATE ENCOUNTER

## 2024-08-04 ENCOUNTER — Ambulatory Visit (INDEPENDENT_AMBULATORY_CARE_PROVIDER_SITE_OTHER)

## 2024-08-04 DIAGNOSIS — I442 Atrioventricular block, complete: Secondary | ICD-10-CM | POA: Diagnosis not present

## 2024-08-04 LAB — CUP PACEART REMOTE DEVICE CHECK
Battery Remaining Longevity: 87 mo
Battery Remaining Percentage: 70 %
Battery Voltage: 2.99 V
Brady Statistic AP VP Percent: 1.1 %
Brady Statistic AP VS Percent: 1 %
Brady Statistic AS VP Percent: 89 %
Brady Statistic AS VS Percent: 10 %
Brady Statistic RA Percent Paced: 1.2 %
Brady Statistic RV Percent Paced: 90 %
Date Time Interrogation Session: 20251125020014
Implantable Lead Connection Status: 753985
Implantable Lead Connection Status: 753985
Implantable Lead Implant Date: 20220427
Implantable Lead Implant Date: 20220427
Implantable Lead Location: 753859
Implantable Lead Location: 753860
Implantable Pulse Generator Implant Date: 20220427
Lead Channel Impedance Value: 430 Ohm
Lead Channel Impedance Value: 450 Ohm
Lead Channel Pacing Threshold Amplitude: 0.75 V
Lead Channel Pacing Threshold Amplitude: 0.875 V
Lead Channel Pacing Threshold Pulse Width: 0.5 ms
Lead Channel Pacing Threshold Pulse Width: 0.5 ms
Lead Channel Sensing Intrinsic Amplitude: 4.6 mV
Lead Channel Sensing Intrinsic Amplitude: 5.6 mV
Lead Channel Setting Pacing Amplitude: 1.125
Lead Channel Setting Pacing Amplitude: 2.5 V
Lead Channel Setting Pacing Pulse Width: 0.5 ms
Lead Channel Setting Sensing Sensitivity: 2 mV
Pulse Gen Model: 2272
Pulse Gen Serial Number: 3920271

## 2024-08-04 NOTE — Progress Notes (Unsigned)
  Electrophysiology Office Note:   Date:  08/05/2024  ID:  Reginald Davis, DOB 1946-02-17, MRN [id number]  Primary Cardiologist: Annabella Scarce, MD Primary Heart Failure: None Electrophysiologist: Myrta Mercer Gladis Norton, MD      History of Present Illness:   Reginald Davis is a 78 y.o. male with h/o complete heart block seen today for routine electrophysiology followup.   Since last being seen in our clinic the patient reports doing overall well.  He does note some change in his exertional capacity.  When he exercises, he gets his heart rate up to approximately 120 bpm and then has a significant drop in his heart rate.  Otherwise, his shortness of breath is mainly consistent and is being managed by pulmonary..  he denies chest pain, palpitations, dyspnea, PND, orthopnea, nausea, vomiting, dizziness, syncope, edema, weight gain, or early satiety.   Review of systems complete and found to be negative unless listed in HPI.      EP Information / Studies Reviewed:    EKG is not ordered today. EKG from 08/05/2024 reviewed which showed sinus rhythm, ventricular paced      PPM Interrogation-  reviewed in detail today,  See PACEART report.  Device History: Abbott Dual Chamber PPM implanted 01/04/2021 for CHB  Risk Assessment/Calculations:            Physical Exam:   VS:  BP (!) 158/80 (BP Location: Right Arm, Patient Position: Sitting, Cuff Size: Normal)   Pulse 68   Ht 5' 6 (1.676 m)   Wt 173 lb 9.6 oz (78.7 kg)   SpO2 94%   BMI 28.02 kg/m    Wt Readings from Last 3 Encounters:  08/05/24 173 lb 9.6 oz (78.7 kg)  08/05/24 173 lb 9.6 oz (78.7 kg)  07/14/24 176 lb 9.6 oz (80.1 kg)     GEN: Well nourished, well developed in no acute distress NECK: No JVD; No carotid bruits CARDIAC: Regular rate and rhythm, no murmurs, rubs, gallops RESPIRATORY:  Clear to auscultation without rales, wheezing or rhonchi  ABDOMEN: Soft, non-tender, non-distended EXTREMITIES:  No edema; No deformity    ASSESSMENT AND PLAN:    CHB s/p Abbott PPM  Normal PPM function See Pace Art report Have increased upper rate to 130 bpm  2.  Hypertension: Elevated today.  Managed by primary cardiology  3.  Coronary disease: Noted on coronary CT.  No current chest pain.  Plan per primary cardiology  Disposition:   Follow up with EP Team in 12 months  Signed, Jadin Kagel Gladis Norton, MD

## 2024-08-05 ENCOUNTER — Encounter (HOSPITAL_BASED_OUTPATIENT_CLINIC_OR_DEPARTMENT_OTHER): Payer: Self-pay | Admitting: Cardiovascular Disease

## 2024-08-05 ENCOUNTER — Encounter: Payer: Self-pay | Admitting: Internal Medicine

## 2024-08-05 ENCOUNTER — Ambulatory Visit (INDEPENDENT_AMBULATORY_CARE_PROVIDER_SITE_OTHER): Admitting: Cardiovascular Disease

## 2024-08-05 ENCOUNTER — Ambulatory Visit: Attending: Cardiology | Admitting: Cardiology

## 2024-08-05 ENCOUNTER — Telehealth: Payer: Self-pay | Admitting: Internal Medicine

## 2024-08-05 ENCOUNTER — Encounter: Payer: Self-pay | Admitting: Cardiology

## 2024-08-05 ENCOUNTER — Ambulatory Visit: Payer: Self-pay | Admitting: Cardiology

## 2024-08-05 ENCOUNTER — Other Ambulatory Visit: Payer: Self-pay | Admitting: *Deleted

## 2024-08-05 VITALS — BP 144/78 | HR 60 | Ht 66.0 in | Wt 173.6 lb

## 2024-08-05 VITALS — BP 158/80 | HR 68 | Ht 66.0 in | Wt 173.6 lb

## 2024-08-05 DIAGNOSIS — I2510 Atherosclerotic heart disease of native coronary artery without angina pectoris: Secondary | ICD-10-CM | POA: Insufficient documentation

## 2024-08-05 DIAGNOSIS — I442 Atrioventricular block, complete: Secondary | ICD-10-CM

## 2024-08-05 DIAGNOSIS — I10 Essential (primary) hypertension: Secondary | ICD-10-CM

## 2024-08-05 DIAGNOSIS — J849 Interstitial pulmonary disease, unspecified: Secondary | ICD-10-CM

## 2024-08-05 DIAGNOSIS — J8410 Pulmonary fibrosis, unspecified: Secondary | ICD-10-CM

## 2024-08-05 LAB — CUP PACEART INCLINIC DEVICE CHECK
Battery Remaining Longevity: 90 mo
Battery Voltage: 2.99 V
Brady Statistic RA Percent Paced: 1.1 %
Brady Statistic RV Percent Paced: 90 %
Date Time Interrogation Session: 20251126114053
Implantable Lead Connection Status: 753985
Implantable Lead Connection Status: 753985
Implantable Lead Implant Date: 20220427
Implantable Lead Implant Date: 20220427
Implantable Lead Location: 753859
Implantable Lead Location: 753860
Implantable Pulse Generator Implant Date: 20220427
Lead Channel Impedance Value: 425 Ohm
Lead Channel Impedance Value: 450 Ohm
Lead Channel Pacing Threshold Amplitude: 0.75 V
Lead Channel Pacing Threshold Amplitude: 1 V
Lead Channel Pacing Threshold Amplitude: 1 V
Lead Channel Pacing Threshold Pulse Width: 0.5 ms
Lead Channel Pacing Threshold Pulse Width: 0.5 ms
Lead Channel Pacing Threshold Pulse Width: 0.5 ms
Lead Channel Sensing Intrinsic Amplitude: 5 mV
Lead Channel Sensing Intrinsic Amplitude: 5.6 mV
Lead Channel Setting Pacing Amplitude: 1 V
Lead Channel Setting Pacing Amplitude: 2.5 V
Lead Channel Setting Pacing Pulse Width: 0.5 ms
Lead Channel Setting Sensing Sensitivity: 4 mV
Pulse Gen Model: 2272
Pulse Gen Serial Number: 3920271

## 2024-08-05 MED ORDER — BREZTRI AEROSPHERE 160-9-4.8 MCG/ACT IN AERO: 2.0000 | INHALATION_SPRAY | Freq: Two times a day (BID) | RESPIRATORY_TRACT | 11 refills | Status: AC

## 2024-08-05 MED ORDER — BREZTRI AEROSPHERE 160-9-4.8 MCG/ACT IN AERO: 2.0000 | INHALATION_SPRAY | Freq: Two times a day (BID) | RESPIRATORY_TRACT | Status: DC

## 2024-08-05 NOTE — Telephone Encounter (Signed)
 Secure chat sent to Dr. Geronimo and received a return message regarding the Breztri  inhaler:  Yes to sending Breztri  prescription to pharmacy.   Also please have 2 samples for him today for pick up given holidays.  Prescription for Breztri  sent to patient's pharmacy and 2 samples left up front for patient to pick up later today.  Blaire sent patient mychart message making him aware that a prescription was sent to his pharmacy and that he has 2 samples he can pick up at our office.  Nothing further needed.

## 2024-08-05 NOTE — Telephone Encounter (Signed)
 Pls give hjim 2 samples of breztri  for pick up 08/05/2024 andalso send in script for Breztri .

## 2024-08-05 NOTE — Progress Notes (Signed)
 Remote PPM Transmission

## 2024-08-05 NOTE — Telephone Encounter (Signed)
 See other encounter. NFN

## 2024-08-05 NOTE — Patient Instructions (Signed)
 Medication Instructions:   Your physician recommends that you continue on your current medications as directed. Please refer to the Current Medication list given to you today.   *If you need a refill on your cardiac medications before your next appointment, please call your pharmacy*  Lab Work:  None ordered.  If you have labs (blood work) drawn today and your tests are completely normal, you will receive your results only by: MyChart Message (if you have MyChart) OR A paper copy in the mail If you have any lab test that is abnormal or we need to change your treatment, we will call you to review the results.  Testing/Procedures:  Your physician has requested that you have an echocardiogram. Echocardiography is a painless test that uses sound waves to create images of your heart. It provides your doctor with information about the size and shape of your heart and how well your heart's chambers and valves are working. This procedure takes approximately one hour. There are no restrictions for this procedure. Please do NOT wear cologne, aftershave, or lotions (deodorant is allowed). Please arrive 15 minutes prior to your appointment time.  Follow-Up: At [facility], you and your health needs are our priority.  As part of our continuing mission to provide you with exceptional heart care, our providers are all part of one team.  This team includes your primary Cardiologist (physician) and Advanced Practice Providers or APPs (Physician Assistants and Nurse Practitioners) who all work together to provide you with the care you need, when you need it.  Your next appointment:   1 year(s)  Provider:   Annabella Scarce, MD    We recommend signing up for the patient portal called MyChart.  Sign up information is provided on this After Visit Summary.  MyChart is used to connect with patients for Virtual Visits (Telemedicine).  Patients are able to view lab/test results, encounter notes,  upcoming appointments, etc.  Non-urgent messages can be sent to your provider as well.   To learn more about what you can do with MyChart, go to [email or web address].   Other Instructions  Your physician wants you to follow-up in: 1 year.  You will receive a reminder letter in the mail two months in advance. If you don't receive a letter, please call our office to schedule the follow-up appointment.

## 2024-08-05 NOTE — Progress Notes (Signed)
 Cardiology Office Note:  .   Date:  08/05/2024  ID:  Reginald Davis, DOB 27-Sep-1945, MRN [id number] PCP: Shayne Anes, MD  [facility] Providers Cardiologist:  Annabella Scarce, MD Electrophysiologist:  Will Gladis Norton, MD    History of Present Illness: .    Reginald Davis is a 78 y.o. male with a hx of non-obstructive CAD, GERD, IPF, congenital blindness of the right eye, complete heart block s/p pacemaker, hypertension, and hyperlipidemia, here for follow-up. He was previously a patient of Dr. Maranda, who last saw him in 11/2020. He had an episode of syncope due to complete heart block 12/2020. Dr. Norton implanted a [employer]. Jude dual -chamber pacemaker 01/04/2021. He had a coronary CTA 08/2018 that revealed mild non-obstructive but diffuse CAD and a calcium  score of 384. He has been intolerant of higher dose statins but has done well on atorvastatin  and Praluent .    At his visit 06/2022, he was no longer on IPF treatment due to associated GI issues. However, his GI upset had not improved much since stopping the trial medication. He was using supplemental oxygen  when exercising routinely and when sleeping at night. Home blood pressures were well controlled. He follows with Dr. Norton for his pacemaker which has been stable. He was seen by Dr. Norton 02/2023 and was doing well.  At his visit 06/2024 he noted a slight progression in his IPF.  He was a little bit more short of breath with exertion and remained on 3 L supplemental oxygen .  He continued to try and stay active.   He was referred for nuclear stress test which revealed normal systolic function and no ischemia.   Discussed the use of AI scribe software for clinical note transcription with the patient, who gave verbal consent to proceed.  History of Present Illness Reginald Davis notes increased shortness of breath. He has idiopathic pulmonary fibrosis and emphysema, with recent episodes of increased shortness of breath. He is currently  participating in a research study in [place], ongoing for about a year and a half. His recent high-resolution CT scan did not show any issues with the pulmonary artery, and he reports that his echocardiogram from the spring was normal. He reports that a recent BMP was normal. He is on maximum medication for IPF, and although he suspects he might be on a placebo in the study, he will not know for another year. He maintains an exercise regimen, which was interrupted by a foot injury a few months ago but has since resumed.  Reginald Davis brings a log of his PB showing that it averages in the 120s/70-80s.  He has white coat HTN.  He experiences desaturation in the evenings after dinner, requiring supplemental oxygen  while sitting and watching TV. He also desaturates to 88 or 87 when walking from the dining room to the exercise room and during the initial minutes on the treadmill, despite using three liters of continuous oxygen . He reports episodes where his heart rate drops significantly during exercise, which he attributes to possible heart block or left bundle branch block.  He has been trying Breztri  in place of Spiriva , which he feels may be helping slightly. He notes that particulate matter count affects his symptoms, and he uses additional filtration at home to mitigate this.  He has a history of prostate cancer concerns, having undergone an MRI and a transparent needle biopsy at [facility], which was negative for cancer. He monitors his blood pressure at home, which is generally  well-controlled, and his recent labs, including cholesterol and TSH, are within normal limits.  ROS:  As per HPI  Studies Reviewed: .       Lexiscan  Myoview  06/2023:     The study is normal. The study is low risk.   No ST deviation was noted.   Left ventricular function is normal. Nuclear stress EF: 59%. The left ventricular ejection fraction is normal (55-65%). End diastolic cavity size is normal. End systolic cavity size  is normal.   Prior study available for comparison from 01/11/2020.  Echo 11/28/23:  1. Left ventricular ejection fraction, by estimation, is 55 to 60%. The  left ventricle has normal function. The left ventricle has no regional  wall motion abnormalities. Left ventricular diastolic parameters are  consistent with Grade I diastolic  dysfunction (impaired relaxation).   2. Right ventricular systolic function is normal. The right ventricular  size is normal.   3. The mitral valve is normal in structure. No evidence of mitral valve  regurgitation. No evidence of mitral stenosis.   4. The aortic valve is tricuspid. Aortic valve regurgitation is not  visualized. Aortic valve sclerosis is present, with no evidence of aortic  valve stenosis.   5. The inferior vena cava is normal in size with greater than 50%  respiratory variability, suggesting right atrial pressure of 3 mmHg.   Risk Assessment/Calculations:     HYPERTENSION CONTROL Vitals:   08/05/24 0859 08/05/24 0943  BP: (!) 160/80 (!) 144/78    The patient's blood pressure is elevated above target today.  In order to address the patient's elevated BP: The blood pressure is usually elevated in clinic.  Blood pressures monitored at home have been optimal.          Physical Exam:   VS:  BP (!) 144/78   Pulse 60   Ht 5' 6 (1.676 m)   Wt 173 lb 9.6 oz (78.7 kg)   SpO2 96%   BMI 28.02 kg/m  , BMI Body mass index is 28.02 kg/m. GENERAL:  Well appearing HEENT: Pupils equal round and reactive, fundi not visualized, oral mucosa unremarkable NECK:  No jugular venous distention, waveform within normal limits, carotid upstroke brisk and symmetric, no bruits, no thyromegaly LUNGS:  Clear to auscultation bilaterally HEART:  RRR.  PMI not displaced or sustained,S1 and S2 within normal limits, no S3, no S4, no clicks, no rubs, no murmurs ABD:  Flat, positive bowel sounds normal in frequency in pitch, no bruits, no rebound, no guarding, no  midline pulsatile mass, no hepatomegaly, no splenomegaly EXT:  2 plus pulses throughout, no edema, no cyanosis no clubbing SKIN:  No rashes no nodules NEURO:  Cranial nerves II through XII grossly intact, motor grossly intact throughout PSYCH:  Cognitively intact, oriented to person place and time   ASSESSMENT AND PLAN: .    Assessment & Plan # Idiopathic pulmonary fibrosis and emphysema with chronic hypoxemia requiring supplemental oxygen  Progressive disease with hypoxemia. No pulmonary hypertension on HRCT and echocardiogram. Desaturation at rest and exertion. Right heart catheterization not indicated and unlikely to be helpful in this situation.  Focus on hypoxia management to prevent pulmonary hypertension. - Continue Breztri . - Use supplemental oxygen  as needed to maintain SaO2 >88% - Repeat echo 11/2024  # Complete atrioventricular block with cardiac device and suspected chronotropic incompetence Complete AV block with device. Suspected chronotropic incompetence post-exercise. Device interrogation completed. - Review device interrogation results with cardiologist.  # Atherosclerotic heart disease of native coronary artery without  angina Atherosclerotic heart disease without angina. Previous stress test negative for coronary artery disease as symptom cause. - Continue current management and monitoring. - Aspirin  and atorvastatin .  # White coat hypertension with superimposed primary hypertension:  Home blood pressure well-controlled. Office readings elevated but not concerning. - Continue current antihypertensive regimen. - Continue valsartan        Dispo: f/u 1 year  Signed, Annabella Scarce, MD

## 2024-08-10 DIAGNOSIS — R946 Abnormal results of thyroid function studies: Secondary | ICD-10-CM | POA: Diagnosis not present

## 2024-08-13 ENCOUNTER — Ambulatory Visit: Payer: Self-pay | Admitting: Cardiology

## 2024-08-15 DIAGNOSIS — R109 Unspecified abdominal pain: Secondary | ICD-10-CM | POA: Diagnosis not present

## 2024-08-15 DIAGNOSIS — M545 Low back pain, unspecified: Secondary | ICD-10-CM | POA: Diagnosis not present

## 2024-08-25 ENCOUNTER — Encounter: Payer: Self-pay | Admitting: Internal Medicine

## 2024-08-25 ENCOUNTER — Telehealth: Payer: Self-pay | Admitting: Internal Medicine

## 2024-08-25 ENCOUNTER — Ambulatory Visit: Admitting: Internal Medicine

## 2024-08-25 VITALS — BP 124/68 | HR 81 | Temp 97.7°F | Ht 66.0 in | Wt 173.2 lb

## 2024-08-25 DIAGNOSIS — J439 Emphysema, unspecified: Secondary | ICD-10-CM | POA: Diagnosis not present

## 2024-08-25 DIAGNOSIS — J84112 Idiopathic pulmonary fibrosis: Secondary | ICD-10-CM

## 2024-08-25 DIAGNOSIS — J849 Interstitial pulmonary disease, unspecified: Secondary | ICD-10-CM

## 2024-08-25 DIAGNOSIS — R0602 Shortness of breath: Secondary | ICD-10-CM | POA: Diagnosis not present

## 2024-08-25 NOTE — Telephone Encounter (Signed)
°  For radiology addendum:   - please call radiologist assistant line [phone] and specify PERRION DIESEL , 1946-09-02 and [id number] - mention imaging type outside HRCT and date July 2025 uploaded in PACS - request addendum for purpose of compare IPF progression to prior HRCT   Please send phone message back when done  Thanks    SIGNATURE    Dr. Dorethia Cave, M.D., F.C.C.P,  Pulmonary and Critical Care Medicine Staff Physician, [facility] Center Director - Interstitial Lung Disease  Program  Pulmonary Fibrosis [facility] Network at [facility] [place], [place], [address]  Pager: [phone], If no answer  OR between  19:00-7:00h: page [phone] Telephone (clinical office): [phone] Telephone (research): [phone]  1:37 PM 08/25/2024

## 2024-08-25 NOTE — Patient Instructions (Addendum)
 ICD-10-CM   1. ILD (interstitial lung disease) (HCC)  J84.9 Pulmonary function test    2. IPF (idiopathic pulmonary fibrosis) (HCC)  J84.112 Pulmonary function test        #IPF: This is slowly progressive over time (Based on PFT)   -  You are on max therapy - On generic pirfenidone  + BMS study drug  v placebo;   Recent recent safety labs at [facility] chest are normal  Plan  - Continue both pirfenidone   - Continue study drug of IPF - Do check with the study sponsor if you are allowed to take standard of care Nerandomilast on top of the above protocol - I will do a CT scan comparison from July 2025 now that we have your outside CT uploaded   - sorry still not read  #Worsening shortness of breath  -Unclear if this because of weight gain or stiff heart muscle or incipient heart failure incipient blood clot or what we call chronotropic incompetence in the setting of pacemaker or worsening fibrosis  - echo march 2025 No evoidence of pulmonary hypertension - On this 08/25/2024 shortness of breath score better with breztri  and weight loss BUT exercise performance ? 2 seconds worse  Plan  - -Continue portable oxygen  with exertion - Continue nighttime oxygen   - get PFT and if dlco decline is more than FVC decline then we should do a Right Heart cath rule out pulmonary hypertension - continue weight loss - continue breztri   Associated emphysema  -Glad some better with BREZTRI   Plan - continue Breztri  2 puff 2 times daily   Follow-up - At 82-month standard of care visit with Dr. Geronimo 30 minutes-  -Symptom questionnaire and walk test at follow-up  - If I am not available then at least see the nurse practitioner

## 2024-08-25 NOTE — Progress Notes (Signed)
 OV 05/07/2024  Subjective:  Patient ID: Reginald Davis, male , DOB: 12-24-1945 , age 78 y.o. , MRN: [id number] , ADDRESS: [address] [place] [address] PCP Shayne Anes, MD Patient Care Team: Shayne Anes, MD as PCP - General (Internal Medicine) Inocencio Soyla Lunger, MD as PCP - Electrophysiology (Cardiology) Raford Riggs, MD as PCP - Cardiology (Cardiology)  This Provider for this visit: Treatment Team:  Attending Provider: Geronimo Amel, MD    05/07/2024 -   Chief Complaint  Patient presents with   Medical Management of Chronic Issues   Interstitial Lung Disease    Breathing has improved some but not back to baseline. He is coughing with moderate amount of yellow sputum.       HPI Reginald Davis 78 y.o. -returns for follow-up with his wife Reginald Davis.  Saw nurse practitioner March 31, 2024.  He seems to have had fever cough and change in sputum.  Chest x-ray showed right middle zone infiltrates [I personally visualized and not sure] but treated with doxycycline  and he is back to baseline now.  Overall feeling baseline.  He continues his pirfenidone .  He is also taking his study drug.  He is finished 52 weeks.  He supposed to go on open label extension but he says that he will just still be on the blinded portion.  This did not make sense to me and I told him to double check.  He had his data from the research protocol.  His spirometry and DLCO last checked in June 2025 is stable compared to May 2025.  He did have a CT scan of the chest while the PFT showed continued decline over the last 1 year the CT scan is reported to be stable but he is not confident about that result.  He is going to bring the CD-ROM here and we will uploaded into our system.   He had safety labs through primary care/research and I reviewed this from August 2025.  It is normal.   He wanted to know what his pneumococcal vaccination status and I gave an update on that.    OV  07/14/2024  Subjective:  Patient ID: Reginald Davis, male , DOB: 04/01/1946 , age 78 y.o. , MRN: [id number] , ADDRESS: [address] [place] [address] PCP Shayne Anes, MD Patient Care Team: Shayne Anes, MD as PCP - General (Internal Medicine) Inocencio Soyla Lunger, MD as PCP - Electrophysiology (Cardiology) Raford Riggs, MD as PCP - Cardiology (Cardiology)  This Provider for this visit: Treatment Team:  Attending Provider: Geronimo Amel, MD    07/14/2024 -   Chief Complaint  Patient presents with   Interstitial Lung Disease    Pt states since LOV breathing has been stable SOB w exertion also with any activity ex: talking he becomes breathless Prod cough (phlegm gray or cream color) Dry cough occasionally      HPI Reginald Davis 78 y.o. -Reginald Davis is a 78 year old male with idiopathic pulmonary fibrosis who presents with increased shortness of breath.  Presents with his wife Reginald Davis who is an independent historian. He has experienced increased shortness of breath compared to his last visit, becoming breathless even while talking. He uses supplemental oxygen  more frequently, especially in the evenings while sitting and watching TV. He has gained approximately four pounds since his last visit, which he attributes to reduced physical activity due to a foot injury, although he has recently resumed his exercise routine. He  experiences frequent desaturation to 87-88% while sitting in his easy chair at night. His last echocardiogram was in the spring, and he has a history of emphysema, which may be contributing to his symptoms. He uses Spiriva  for COPD management and has noticed increased phlegm production and a deeper cough over the past few months.   Consistent with his reports of worsening dyspnea his dyspnea score is worse but his exercise hypoxemia test appears to be same although he is more dyspneic.  He did do a 6-minute walk test at the research center on room air  [documented below] I reviewed external records for this.  He is not happy with the quality of 6-minute walk test done at the research center but comparing the one from summer 2024 his distance only off by 30 m or so.  He is currently taking Esbriet  and a study drug, which remains blinded.  This is through [facility] chest.  His pulmonary function tests have shown a slight decline over time, with the most recent DLCO test conducted in June and a walk test in September. The walk test was redone due to protocol deviation as he wore oxygen  during the initial test. Distances apear very similar to a year ago  He is sensitive to elevated PM2.5 levels and monitors these environmental factors closely. He takes a baby aspirin  daily. No leg swelling.        OV 08/25/2024  Subjective:  Patient ID: Reginald Davis, male , DOB: [birth year] , age 78 y.o. , MRN: [id number] , ADDRESS: [address] [place] [address] PCP Shayne Anes, MD Patient Care Team: Shayne Anes, MD as PCP - General (Internal Medicine) Inocencio Soyla Lunger, MD as PCP - Electrophysiology (Cardiology) Raford Riggs, MD as PCP - Cardiology (Cardiology)  This Provider for this visit: Treatment Team:  Attending Provider: Geronimo Amel, MD    08/25/2024 -   Chief Complaint  Patient presents with   Interstitial Lung Disease    Pt states since LOV breathing has been about the same SOB occurs w/ exertion sometimes while talking pt becomes SOB  Prod cough ( phlegm tan, gray) also has a Dry cough that occurs      IPF dx givien 08/26/20 based on probable UIP on CT + negative srology, +  age > 78, + white + male + prior smoker + heart burn/GERD hx-> this is IPF  -Last CT scan of the chest December 2022  - Esbriet  start date -> Oct 26, 2020   - Pulm rehab Mar 2022  - Clinical trial education - Mar 2022 (Pliant D ICF given)     - Pliant study participant - onsent 02Jun2022 and was randomized 22Jun2022 -0> completed early  (feb) 2023   - [facility] SQ Vixarelimab study - Screen failed due to low ratio in fev1/fvc   -He is on [employer] study drug through [facility] chest.    - completed 52 wees summer 2025  - PFF concept introduction - May 2022   -National conference November 2023     Associated mild emphysema present -started Spiriva   Normal Echo May 2021 and summer 2023  WEight loss  April 2022: 171#  June 2023: 162#  SEpt 2023: 158# - bmi 25.14 Aug 2022: 154#- bmi 24.13 December 2022-155 pounds  Aug 2024: 164#  Feb 2025: 169#  Aug 2025: 168#  Dec 2025L 173#      Esbriet /Pirfenidone  requires intensive drug monitoring due to high concerns for Adverse effects of ,  including  Drug Induced Liver Injury, significant GI side effects that include but not limited to Diarrhea, Nausea, Vomiting,  and other system side effects that include Fatigue, headaches, weight loss and other side effects such as skin rash. These will be monitored with  blood work such as LFT initially once a month for 6 months and then quarterly  #Screening for pulmonary hypertension'  -No evidence of pulmonary hypertension on echo March 2025  #Radiologic screening - Most recent CT scan of the chest at [facility] chest February 27, 2024 with ILD and emphysema.  No change in ILD compared to April 2024.  HPI Reginald Davis 78 y.o. -Reginald Davis is a 78 year old male with pulmonary fibrosis and emphysema who presents with increased shortness of breath.  He has experienced increased shortness of breath over the past month, with desaturation to 86-87% from a baseline of 94-95% during activities such as sitting, walking up stairs, and talking. Oxygen  is required during these activities, and he wears it consistently during exercise. He has been using Breztri , which he suspects has helped somewhat, although he finds it difficult to quantify the improvement. He has also been focusing on weight loss, which he believes has had a  positive impact on his condition. is pacemaker settings were adjusted to address a previous issue of heart block during peak exertion. The upper limit was increased from 120 to 130 bpm, which has helped, although he still experiences shortness of breath.Despite these efforts, he is not able to conclusively say that in the last 1 month his shortness of breath is improved he knows it is not worse and he knows that it is definitely worse in the last few to several months.  In fact on a symptom score his story is quite consistent.  His symptoms are slightly better than his most recent visit and #2025 but his dyspnea is definitely worse compared to August and earlier in 2025.  At the last visit because of his associated emphysema we increased his inhaler to Breztri .  He did have a research PFT.  In this research PFT they did not do DLCO.  They only did FVC in the FVC somewhat better.  He brought in the records and I captured the information.  He is interested in retesting his pulmonary function test and knowing his DLCO.  For his emphysema:The steroid component of Breztri  has affected his voice, although he rinses after use to mitigate this effect. He has not noticed any thrush.       SYMPTOM SCALE - ILD 09/29/2020  11/10/2020 Now on esbriet  sinc 10/26/20 12/21/2020 esbriet  + 171# 08/22/2021 Esbriet  and Pliant study drug , 173# 11/28/2021 Esbriet , off plaint study, 2L [place] night, 3L [place] exercise, 163# intential weight loss 03/02/2022 162# night o2 , ex o2 subjectively 06/07/2022 Uses exercise oxygen  08/27/2022 154#  - diarrhea sept/oc and took esbriet  holiday early oc-nov 2023 and needex xifaxan. BAck on esbrie at time of this visit 12/20/2022  04/22/2023 Generic pirfenidone  _ BMS study drug vial [facility] 07/23/2023 Generic esbreit + BMS study drug 11/04/2023 Genreic esbriet  + BMS study drug 05/07/2024 Generic esbriet  + BMS stidy drug (placebo v IP) 07/14/2024 Generic pirfenidone  plus BMS study drug [placebo  versus IP] 08/25/2024 Generic pirfenidone  plus BMS study drug [placebo versus IP] ->+ BREZTRI   O2 use ra  ra a t rest 2-3L with exercise pulsed 2L [place] at night, 3L pulsed during exerise and travels  Shortness of Breath 0 -> 5 scale with 5 being worst (score 6 If unable to do)                At rest 0.5 0 0.5 1 1 1  0.5 1 1  0/5 1 1  1.5 1 1   Simple tasks - showers, clothes change, eating, shaving 1 00 0 0 1 1 1 1 1 0  1 1 1.5 1.5 2  Household (dishes, doing bed, laundry) 1 0 0.5 1 2  1.5 1 2 2 1 2 2  1.5 3 2   Shopping 1 0 0.5 1 2  1.5 1 1.5 1 1 1 1  1.5 2.5 2  Walking level at own pace 1.5 1 1  0 2 2 2 2 2 1 1 2 2  3 3  Walking up Stairs *2.25 2 2 2 3 3 2  2.5 3 1.5 2 2 3 4 4   Total (30-36) Dyspnea Score 7.25 3 6 5 11 10  7.5 10 10 5 8 9 11 19 14   How bad is your cough? Improved with ppi 1 1 ` 1 1 0.5 1 1 1 1 1 2  1.5 1  How bad is your fatigue 0 0 0.5 ` 1 1 1 1 1  0 1 1 0 1 2  How bad is nausea 0 0 0 0 0 0 0 0 0 0  0 0 0 0 0  How bad is vomiting?  0 00 0 0 0 0 0 0 1 0  0 0 0 0 0  How bad is diarrhea? 0.5 coffee 0 0.25 1 1  0 2.5 1 0 0 0 0 0 0 0  How bad is anxiety? 0.5 0 0.5 1 0 0 0.5 0 0 0 00 0 0 0 0  How bad is depression 0 0 0 0 0 0 0 0  0 0 1 0 0 0   Walk test Simple office walk 185 feet x  3 laps goal with forehead probe 08/09/2020  [facility] 89% and HR111. 1296 fet. 0L o2 needed. Pl JR 111/ done 10/24/20  08/22/2021 - at [facility] 05/01/21 - walked 1671 feet wihout o2. This is an imprement  03/02/2022  163# -11/01/2021 [facility] [employer] the 6-minute walk and completed 1746 feet.  Lowest pulse ox was 91%.  Heart rate was 107.  Did 15 labs.  Did not need oxygen . Aug 2023 at [facility] - 1734 feet [578 m]. Did not need o2 08/27/2022  12/20/2022 11/01/2022 6-minute walk test at [facility] 1728 feet [576 m]/116 % predicted.  Lowest pulse ox was 91% on room air.  Maximum heart rate was 111 February 26, 2023 research at [facility] chest 6-minute walk test -> 468 m [8464 feet].  Started at 92% ended  up at 87%.  This was not a straight hallway and may need zigzag 09/17/2023 [place] chest: 564 metere, lowest pulse ox 85% 07/14/2024   O2 used ra ra ra ra  ra ra   Room air  Number laps completed 3 3 3 3  3 3    Sit and stand Test x 15 times.  Comments about pace normal nL        Completed this and 32.8 seconds  Resting Pulse Ox/HR 96% and 80/min 96% and 90.min 99% and 60 97% and HR 62  100% and HR 68 95% and HR 67   96% with a heart rate of 74 and a score of 4  Final Pulse Ox/HR 94% and 90/min 88% and 97/min 95% and  94 94% and HR 84  97% and HR 106 92% and HR 72   96% with a heart rate of 94 and a dyspnea score of 7  Desaturated </= 88% no yes yno no  no no   93% with a heart rate of 77 and the dyspnea score of 5.5  Desaturated <= 3% points no Yes, 8 points Yes, 4 Yes, 3 points  Yes, 3 points Yes 3 points     Got Tachycardic >/= 90/min yes yes yes no  yes yes     Symptoms at end of test dyspnea Some dyspnea Mild dyspea none  none no     Miscellaneous comments x   Bris pad  stable    Desaturation levels appear to be the same as before        SIT STAND TEST - goal 15 times   05/07/2024 624 meters - needed 5L  (first time neeed o2 - 02/25/24) .07/14/2024 6-minute walk test done 05/18/2024 at [facility] chest on room air forted and 444 m.  Started with pulse ox of 95% and completed with a pulse ox of 88% baseline heart rate 87 end of test heart rate 70 08/25/2024.  O2 used ra ra ra  PRobe - finter or forehead finger Finger finger  Number sit and stand completed - goal 15 15 15  times 15  Time taken to complete 40 sec 32.8 seconds 38 sec  Resting Pulse Ox/HR/Dyspnea  95% and 64/min and dyspnea of 1/10  96% with heart rate of 74 and dyspnea score of 4 96% and HR 84 and score 3  Peak measures 96 % and 87/min and dyspnea of 3/10 96% with a heart rate of 94 and dyspnea score of 7 95% and HR 107 and score 7  Final Pulse Ox/HR 92% and 69/min and dyspnea of 3/10 93% with a heart rate of 77 and dyspnea score of  5.5 92% and HR 83 and score 4  Desaturated </= 88% no no   Desaturated <= 3% points yes no   Got Tachycardic >/= 90/min No but almsot yes   Miscellaneous comments x Worsening dyspnea score for similar performance and similar levels of oxygen  drop Same but 2 seconds lower compared to recent time but overall range bound          Latest Ref Rng & Units 08/18/2024 research 09/092025 reearch [id number] researc 01/09/24 [facility] researc 10/23/23 [facility]  Research 09/17/2023 [facility] Reseach 06/19/23 [facility] Research 02/26/23  Research at [facility] 12/20/2022   12:19 PM 08/22/2022    8:53 AM 03/02/2022    8:49 AM 11/25/2020    8:52 AM 07/12/2020   11:53 AM  PFT Results  FVC-Pre L 3.38 L 3.22 3.27 3.23 3.45 3.58 3.35 3.92 3.73  3.94  3.89  3.43  3.36   FVC-Predicted Pre %     98.5%    105  111  108  94  88   FVC-Post L             3.41   FVC-Predicted Post %             90   Pre FEV1/FVC % %         73  70  71  74  72   Post FEV1/FCV % %             74   FEV1-Pre L         2.73  2.76  2.75  2.53  2.41  FEV1-Predicted Pre %         107  109  106  97  88   FEV1-Post L             2.51   DLCO uncorrected ml/min/mmHg         15.84  16.25  14.76  13.56  16.88   DLCO UNC% %         71  73  66  60  73   DLCO corrected ml/min/mmHg   12.53 x x 11.92 x x 16.03  16.25  14.84  13.45  16.88   DLCO COR %Predicted %   56.47%      72  73  67  60  73   DLVA Predicted %         74  71  66  66  80   TLC L             5.39   TLC % Predicted %             83   RV % Predicted %             65        has a past medical history of Burn, Coronary artery disease, Diverticul disease small and large intestine, no perforati or abscess, Exertional dyspnea (06/24/2023), GERD (gastroesophageal reflux disease), Hernia, History of nuclear stress test, Hyperlipidemia, Low back pain, Oxygen  toxicity, Plantar fasciitis, Retinal tear, Right rotator cuff tear, and Shingles.   reports that he quit smoking about 28 years ago. His smoking use  included cigarettes. He started smoking about 48 years ago. He has a 40 pack-year smoking history. He has been exposed to tobacco smoke. He has never used smokeless tobacco.  Past Surgical History:  Procedure Laterality Date   EYE SURGERY     PACEMAKER IMPLANT N/A 01/04/2021   Procedure: PACEMAKER IMPLANT;  Surgeon: Inocencio Soyla Lunger, MD;  Location: [facility] INVASIVE CV LAB;  Service: Cardiovascular;  Laterality: N/A;   REFRACTIVE SURGERY     retina repair and retina tear   SHOULDER ARTHROSCOPY     SHOULDER SURGERY     TONSILLECTOMY      Allergies[1]  Immunization History  Administered Date(s) Administered   Fluad Quad(high Dose 65+) 06/07/2022   Fluzone Influenza virus vaccine,trivalent (IIV3), split virus 06/01/2010, 06/07/2011, 05/27/2012   INFLUENZA, HIGH DOSE SEASONAL PF 06/15/2015, 06/05/2016, 06/08/2017, 06/07/2018   Influenza Split 09/22/2009, 07/28/2013   Influenza, Quadrivalent, Recombinant, Inj, Pf 06/07/2018, 06/12/2019, 06/22/2020, 06/01/2021, 06/06/2023   Influenza,inj,Quad PF,6+ Mos 06/18/2014   Moderna Covid-19 Vaccine  Bivalent Booster 33yrs & up 06/14/2021, 01/05/2022   PFIZER(Purple Top)SARS-COV-2 Vaccination 10/05/2019, 10/27/2019, 06/03/2020   PNEUMOCOCCAL CONJUGATE-20 02/26/2021   Pfizer(Comirnaty )Fall Seasonal Vaccine 12 years and older 01/04/2023, 05/30/2023, 12/11/2023, 05/28/2024   Pneumococcal Conjugate-13 08/12/2013, 06/13/2014   Pneumococcal Polysaccharide-23 11/04/2000, 09/22/2009, 08/16/2010   Respiratory Syncytial Virus Vaccine ,Recomb Aduvanted(Arexvy ) 05/21/2022   Td 09/10/1998, 09/22/2009, 12/23/2019   Tdap 10/02/2010   Typhoid Inactivated 09/09/2015   Zoster Recombinant(Shingrix) 01/13/2017, 04/26/2017   Zoster, Live 09/16/2007, 09/22/2009    Family History  Problem Relation Age of Onset   Hyperlipidemia Mother    Diabetes Father    Heart disease Father    Coronary artery disease Brother     Current Medications[2]      Objective:    Vitals:   08/25/24 1300  BP: 124/68  Pulse: 81  Temp: 97.7 F (36.5 C)  TempSrc: Oral  SpO2: 96%  Weight:  173 lb 3.2 oz (78.6 kg)  Height: 5' 6 (1.676 m)    Estimated body mass index is 27.96 kg/m as calculated from the following:   Height as of this encounter: 5' 6 (1.676 m).   Weight as of this encounter: 173 lb 3.2 oz (78.6 kg).  @WEIGHTCHANGE @  [employer]   08/25/24 1300  Weight: 173 lb 3.2 oz (78.6 kg)     Physical Exam   General: No distress. Lookss same .  Does get dyspneic talking O2 at rest: yes Cane present: no Sitting in wheel chair: no Frail: no Obese: no Neuro: Alert and Oriented x 3. GCS 15. Speech normal Psych: Pleasant Resp:  Barrel Chest - no.  Wheeze - no, Crackles - yes base, No overt respiratory distress CVS: Normal heart sounds. Murmurs - no Ext: Stigmata of Connective Tissue Disease - no HEENT: Normal upper airway. PEERL +. No post nasal drip        Assessment/     Assessment & Plan ILD (interstitial lung disease) (HCC)  IPF (idiopathic pulmonary fibrosis) (HCC)    PLAN Patient Instructions     ICD-10-CM   1. ILD (interstitial lung disease) (HCC)  J84.9 Pulmonary function test    2. IPF (idiopathic pulmonary fibrosis) (HCC)  J84.112 Pulmonary function test        #IPF: This is slowly progressive over time (Based on PFT)   -  You are on max therapy - On generic pirfenidone  + BMS study drug  v placebo;   Recent recent safety labs at [facility] chest are normal  Plan  - Continue both pirfenidone   - Continue study drug of IPF - Do check with the study sponsor if you are allowed to take standard of care Nerandomilast on top of the above protocol - I will do a CT scan comparison from July 2025 now that we have your outside CT uploaded   - sorry still not read  #Worsening shortness of breath  -Unclear if this because of weight gain or stiff heart muscle or incipient heart failure incipient blood clot or what we call  chronotropic incompetence in the setting of pacemaker or worsening fibrosis  - echo march 2025 No evoidence of pulmonary hypertension - On this 08/25/2024 shortness of breath score better with breztri  and weight loss BUT exercise performance ? 2 seconds worse  Plan  - -Continue portable oxygen  with exertion - Continue nighttime oxygen   - get PFT and if dlco decline is more than FVC decline then we should do a Right Heart cath rule out pulmonary hypertension - continue weight loss - continue breztri   Associated emphysema  -Glad some better with BREZTRI   Plan - continue Breztri  2 puff 2 times daily   Follow-up - At 94-month standard of care visit with Dr. Geronimo 30 minutes-  -Symptom questionnaire and walk test at follow-up  - If I am not available then at least see the nurse practitioner    FOLLOWUP    Return for  At 31-month standard of care visit with Dr. Geronimo 30 minute.  ( Level 05 visit E&M 2024: Estb >= 40 min   in  visit type: on-site physical face to visit  in total care time and counseling or/and coordination of care by this undersigned MD - Dr Dorethia Geronimo. This includes one or more of the following on this same day 08/25/2024: pre-charting, chart review, note writing, documentation discussion of test results, diagnostic or treatment recommendations, prognosis, risks and benefits of management options,  instructions, education, compliance or risk-factor reduction. It excludes time spent by the CMA or office staff in the care of the patient. Actual time 40 min)   SIGNATURE    Dr. Dorethia Cave, M.D., F.C.C.P,  Pulmonary and Critical Care Medicine Staff Physician, [facility] Director - Interstitial Lung Disease  Program  Pulmonary Fibrosis [facility] at [facility] [place], [place], [address]  Pager: [phone], If no answer or between  15:00h - 7:00h: call [phone] Telephone: [phone]  6:11  PM 08/25/2024    [1]  Allergies Allergen Reactions   Ibuprofen Other (See Comments)    Other Reaction(s): Elevated LFT   Loratadine Other (See Comments)    Other Reaction(s): Urinary Problems  [2]  Current Outpatient Medications:    Alirocumab  (PRALUENT ) 150 MG/ML SOAJ, Inject 150 mg as directed every 30 (thirty) days. , Disp: , Rfl:    allopurinol  (ZYLOPRIM ) 100 MG tablet, Take 1 tablet by mouth 2 (two) times daily., Disp: , Rfl:    Alpha-Lipoic Acid 200 MG CAPS, Take 1 capsule by mouth in the morning and at bedtime., Disp: , Rfl:    aspirin  81 MG tablet, Take 81 mg by mouth daily., Disp: , Rfl:    atorvastatin  (LIPITOR) 20 MG tablet, Take 10 mg by mouth at bedtime., Disp: , Rfl:    budesonide-glycopyrrolate-formoterol (BREZTRI  AEROSPHERE) 160-9-4.8 MCG/ACT AERO inhaler, Inhale 2 puffs into the lungs 2 (two) times daily., Disp: , Rfl:    budesonide-glycopyrrolate-formoterol (BREZTRI  AEROSPHERE) 160-9-4.8 MCG/ACT AERO inhaler, Inhale 2 puffs into the lungs in the morning and at bedtime. Rinse mouth after each use., Disp: 10.7 g, Rfl: 11   budesonide-glycopyrrolate-formoterol (BREZTRI  AEROSPHERE) 160-9-4.8 MCG/ACT AERO inhaler, Inhale 2 puffs into the lungs in the morning and at bedtime., Disp: , Rfl:    Cholecalciferol (VITAMIN D3) 1.25 MG (50000 UT) CAPS, Take 1 capsule (1.25 mg total) by mouth every 30 (thirty) days., Disp: 12 capsule, Rfl: 11   Coenzyme Q10 300 MG CAPS, Take 1 capsule by mouth every morning., Disp: , Rfl: 0   colchicine  0.6 MG tablet, Take 1 tablet by mouth 2 (two) times daily as needed (gout flare)., Disp: , Rfl:    dorzolamide -timolol  (COSOPT ) 22.3-6.8 MG/ML ophthalmic solution, Place 1 drop into the right eye 2 (two) times daily., Disp: , Rfl:    fluorometholone (FML) 0.1 % ophthalmic suspension, Place 1 drop into the right eye daily., Disp: , Rfl:    Guaifenesin  1200 MG TB12, Take 1,200 mg by mouth 2 (two) times daily., Disp: , Rfl:    latanoprost (XALATAN) 0.005 %  ophthalmic solution, Place 1 drop into the right eye at bedtime., Disp: , Rfl:    Multiple Vitamins-Minerals (CENTRUM SILVER PO), Take 1 tablet by mouth every evening., Disp: , Rfl:    OXYGEN , Inhale 2 L into the lungs., Disp: , Rfl:    Pirfenidone  801 MG TABS, Take 1 tablet (801 mg total) by mouth in the morning, at noon, and at bedtime., Disp: 270 tablet, Rfl: 1   Polyethyl Glycol-Propyl Glycol (SYSTANE) 0.4-0.3 % SOLN, Apply 1 drop to eye daily as needed (dry eyes)., Disp: , Rfl:    triamcinolone cream (KENALOG) 0.1 %, Apply topically in the morning and at bedtime., Disp: , Rfl:    valACYclovir (VALTREX) 1000 MG tablet, Take 1 tablet by mouth daily as needed (fever blisters)., Disp: , Rfl:    valsartan  (DIOVAN ) 320 MG tablet, TAKE 1 TABLET (320  MG TOTAL) BY MOUTH EVERY EVENING., Disp: 90 tablet, Rfl: 3   vitamin C (ASCORBIC ACID ) 500 MG tablet, Take 500 mg by mouth every evening., Disp: , Rfl:    albuterol  (VENTOLIN  HFA) 108 (90 Base) MCG/ACT inhaler, Inhale 1-2 puffs into the lungs every 6 (six) hours as needed for wheezing or shortness of breath. (Patient not taking: Reported on 08/25/2024), Disp: 18 g, Rfl: 1   Loperamide-Simethicone 2-125 MG TABS, Take 1 tablet by mouth as needed. Up to 4 times a day. (Patient not taking: Reported on 08/25/2024), Disp: , Rfl:    XIFAXAN 550 MG TABS tablet, Take 550 mg by mouth 3 (three) times daily. PRN (Patient not taking: Reported on 08/25/2024), Disp: , Rfl:

## 2024-09-02 NOTE — Telephone Encounter (Signed)
 Called and spoke with Slater at [facility]  She states that they are unable to compare since the scan has not been uploaded yet  Status says it's begun  Will check back after the holidays to see if it's there and will go from there  Routing to me for continued f/u

## 2024-09-04 ENCOUNTER — Inpatient Hospital Stay (HOSPITAL_COMMUNITY): Admission: RE | Admit: 2024-09-04 | Source: Ambulatory Visit

## 2024-09-08 ENCOUNTER — Ambulatory Visit (HOSPITAL_COMMUNITY)
Admission: RE | Admit: 2024-09-08 | Discharge: 2024-09-08 | Disposition: A | Discharge status: Home / Self Care | Source: Ambulatory Visit | Attending: Internal Medicine | Admitting: Internal Medicine

## 2024-09-08 DIAGNOSIS — J84112 Idiopathic pulmonary fibrosis: Secondary | ICD-10-CM | POA: Insufficient documentation

## 2024-09-08 DIAGNOSIS — J849 Interstitial pulmonary disease, unspecified: Secondary | ICD-10-CM | POA: Insufficient documentation

## 2024-09-08 LAB — PULMONARY FUNCTION TEST
DL/VA % pred: 60 %
DL/VA: 2.42 ml/min/mmHg/L
DLCO unc % pred: 53 %
DLCO unc: 11.54 ml/min/mmHg
FEF 25-75 Pre: 1.42 L/s
FEF2575-%Pred-Pre: 82 %
FEV1-%Pred-Pre: 105 %
FEV1-Pre: 2.59 L
FEV1FVC-%Pred-Pre: 95 %
FEV6-%Pred-Pre: 111 %
FEV6-Pre: 3.59 L
FEV6FVC-%Pred-Pre: 102 %
FVC-%Pred-Pre: 108 %
FVC-Pre: 3.76 L
Pre FEV1/FVC ratio: 69 %
Pre FEV6/FVC Ratio: 95 %

## 2024-09-21 ENCOUNTER — Telehealth: Payer: Self-pay | Admitting: Internal Medicine

## 2024-09-21 NOTE — Telephone Encounter (Signed)
 Patient is dropping off DMV application for Handicapped Plate. Will be placed in Dr.Ramsawamy's box for completion. Patient would like a phone call when it is ready for pickup. Number on file is up to date. He also uses MyChart.

## 2024-09-23 NOTE — Telephone Encounter (Signed)
 MR, I placed the handicap placard in your folder to be signed, thanks!

## 2024-09-24 ENCOUNTER — Telehealth: Payer: Self-pay | Admitting: Internal Medicine

## 2024-09-24 ENCOUNTER — Encounter: Payer: Self-pay | Admitting: Internal Medicine

## 2024-09-24 ENCOUNTER — Ambulatory Visit: Admitting: Internal Medicine

## 2024-09-24 VITALS — BP 136/82 | HR 65 | Temp 97.7°F | Ht 66.0 in | Wt 170.6 lb

## 2024-09-24 DIAGNOSIS — J439 Emphysema, unspecified: Secondary | ICD-10-CM | POA: Diagnosis not present

## 2024-09-24 DIAGNOSIS — R0609 Other forms of dyspnea: Secondary | ICD-10-CM | POA: Diagnosis not present

## 2024-09-24 DIAGNOSIS — J849 Interstitial pulmonary disease, unspecified: Secondary | ICD-10-CM

## 2024-09-24 DIAGNOSIS — Z87891 Personal history of nicotine dependence: Secondary | ICD-10-CM | POA: Diagnosis not present

## 2024-09-24 DIAGNOSIS — Z5181 Encounter for therapeutic drug level monitoring: Secondary | ICD-10-CM | POA: Diagnosis not present

## 2024-09-24 DIAGNOSIS — J84112 Idiopathic pulmonary fibrosis: Secondary | ICD-10-CM

## 2024-09-24 DIAGNOSIS — R0602 Shortness of breath: Secondary | ICD-10-CM

## 2024-09-24 NOTE — Progress Notes (Signed)
 "     OV 05/07/2024  Subjective:  Patient ID: Reginald Davis, male , DOB: [birth year] , age 79 y.o. , MRN: [id number] , ADDRESS: [address] [place] [address] PCP Shayne Anes, MD Patient Care Team: Shayne Anes, MD as PCP - General (Internal Medicine) Inocencio Soyla Lunger, MD as PCP - Electrophysiology (Cardiology) Raford Riggs, MD as PCP - Cardiology (Cardiology)  This Provider for this visit: Treatment Team:  Attending Provider: Geronimo Amel, MD    05/07/2024 -   Chief Complaint  Patient presents with   Medical Management of Chronic Issues   Interstitial Lung Disease    Breathing has improved some but not back to baseline. He is coughing with moderate amount of yellow sputum.       HPI Reginald Davis 79 y.o. -returns for follow-up with his wife Aleck.  Saw nurse practitioner March 31, 2024.  He seems to have had fever cough and change in sputum.  Chest x-ray showed right middle zone infiltrates [I personally visualized and not sure] but treated with doxycycline  and he is back to baseline now.  Overall feeling baseline.  He continues his pirfenidone .  He is also taking his study drug.  He is finished 52 weeks.  He supposed to go on open label extension but he says that he will just still be on the blinded portion.  This did not make sense to me and I told him to double check.  He had his data from the research protocol.  His spirometry and DLCO last checked in June 2025 is stable compared to May 2025.  He did have a CT scan of the chest while the PFT showed continued decline over the last 1 year the CT scan is reported to be stable but he is not confident about that result.  He is going to bring the CD-ROM here and we will uploaded into our system.   He had safety labs through primary care/research and I reviewed this from August 2025.  It is normal.   He wanted to know what his pneumococcal vaccination status and I gave an update on that.    OV  07/14/2024  Subjective:  Patient ID: Reginald Davis, male , DOB: [birth year] , age 46 y.o. , MRN: [id number] , ADDRESS: [address] [place] [address] PCP Shayne Anes, MD Patient Care Team: Shayne Anes, MD as PCP - General (Internal Medicine) Inocencio Soyla Lunger, MD as PCP - Electrophysiology (Cardiology) Raford Riggs, MD as PCP - Cardiology (Cardiology)  This Provider for this visit: Treatment Team:  Attending Provider: Geronimo Amel, MD    07/14/2024 -   Chief Complaint  Patient presents with   Interstitial Lung Disease    Pt states since LOV breathing has been stable SOB w exertion also with any activity ex: talking he becomes breathless Prod cough (phlegm gray or cream color) Dry cough occasionally      HPI Reginald Davis 79 y.o. -Reginald Davis is a 79 year old male with idiopathic pulmonary fibrosis who presents with increased shortness of breath.  Presents with his wife Aleck who is an independent historian. He has experienced increased shortness of breath compared to his last visit, becoming breathless even while talking. He uses supplemental oxygen  more frequently, especially in the evenings while sitting and watching TV. He has gained approximately four pounds since his last visit, which he attributes to reduced physical activity due to a foot injury, although he has recently resumed his exercise  routine. He experiences frequent desaturation to 87-88% while sitting in his easy chair at night. His last echocardiogram was in the spring, and he has a history of emphysema, which may be contributing to his symptoms. He uses Spiriva  for COPD management and has noticed increased phlegm production and a deeper cough over the past few months.   Consistent with his reports of worsening dyspnea his dyspnea score is worse but his exercise hypoxemia test appears to be same although he is more dyspneic.  He did do a 6-minute walk test at the research center on room air  [documented below] I reviewed external records for this.  He is not happy with the quality of 6-minute walk test done at the research center but comparing the one from summer 2024 his distance only off by 30 m or so.  He is currently taking Esbriet  and a study drug, which remains blinded.  This is through [facility] chest.  His pulmonary function tests have shown a slight decline over time, with the most recent DLCO test conducted in June and a walk test in September. The walk test was redone due to protocol deviation as he wore oxygen  during the initial test. Distances apear very similar to a year ago  He is sensitive to elevated PM2.5 levels and monitors these environmental factors closely. He takes a baby aspirin  daily. No leg swelling.        OV 08/25/2024  Subjective:  Patient ID: Reginald Davis, male , DOB: 1945-09-25 , age 33 y.o. , MRN: [id number] , ADDRESS: [address] [place] [address] PCP Shayne Anes, MD Patient Care Team: Shayne Anes, MD as PCP - General (Internal Medicine) Inocencio Soyla Lunger, MD as PCP - Electrophysiology (Cardiology) Raford Riggs, MD as PCP - Cardiology (Cardiology)  This Provider for this visit: Treatment Team:  Attending Provider: Geronimo Amel, MD    08/25/2024 -   Chief Complaint  Patient presents with   Interstitial Lung Disease    Pt states since LOV breathing has been about the same SOB occurs w/ exertion sometimes while talking pt becomes SOB  Prod cough ( phlegm tan, gray) also has a Dry cough that occurs   HPI Reginald Davis 79 y.o. -Reginald Davis is a 79 year old male with pulmonary fibrosis and emphysema who presents with increased shortness of breath.  He has experienced increased shortness of breath over the past month, with desaturation to 86-87% from a baseline of 94-95% during activities such as sitting, walking up stairs, and talking. Oxygen  is required during these activities, and he wears it consistently during  exercise. He has been using Breztri , which he suspects has helped somewhat, although he finds it difficult to quantify the improvement. He has also been focusing on weight loss, which he believes has had a positive impact on his condition. is pacemaker settings were adjusted to address a previous issue of heart block during peak exertion. The upper limit was increased from 120 to 130 bpm, which has helped, although he still experiences shortness of breath.Despite these efforts, he is not able to conclusively say that in the last 1 month his shortness of breath is improved he knows it is not worse and he knows that it is definitely worse in the last few to several months.  In fact on a symptom score his story is quite consistent.  His symptoms are slightly better than his most recent visit and #2025 but his dyspnea is definitely worse compared to August and  earlier in 2025.  At the last visit because of his associated emphysema we increased his inhaler to Breztri .  He did have a research PFT.  In this research PFT they did not do DLCO.  They only did FVC in the FVC somewhat better.  He brought in the records and I captured the information.  He is interested in retesting his pulmonary function test and knowing his DLCO.  For his emphysema:The steroid component of Breztri  has affected his voice, although he rinses after use to mitigate this effect. He has not noticed any thrush.         OV 09/24/2024  Subjective:  Patient ID: Reginald Davis, male , DOB: September 02, 1946 , age 7 y.o. , MRN: [id number] , ADDRESS: [address] [place] [address] PCP Shayne Anes, MD Patient Care Team: Shayne Anes, MD as PCP - General (Internal Medicine) Inocencio Soyla Lunger, MD as PCP - Electrophysiology (Cardiology) Raford Riggs, MD as PCP - Cardiology (Cardiology)  This Provider for this visit: Treatment Team:  Attending Provider: Geronimo Amel, MD    09/24/2024 -   Chief Complaint  Patient presents  with   Interstitial Lung Disease    Pt states since LOV breathing has been about the same SOB occurs w/ exertion sometimes occurs w/ any actovity Prod cough ( phlegm tan, gray) Pt currently using 4l of poc  At night pt is on 4l of continuous and while excising at home pt is on 4l continuous       IPF dx givien 08/26/20 based on probable UIP on CT + negative srology, +  age > 32, + white + male + prior smoker + heart burn/GERD hx-> this is IPF  -Last CT scan of the chest December 2022  - Esbriet  start date -> Oct 26, 2020   - Pulm rehab Mar 2022  - Clinical trial education - Mar 2022 (Pliant D ICF given)     - Pliant study participant - onsent 02Jun2022 and was randomized 22Jun2022 -0> completed early (feb) 2023   - [facility] Vixarelimab study - Screen failed due to low ratio in fev1/fvc   -He is on [employer] study drug through [facility] chest.    - completed 52 wees summer 2025  - PFF concept introduction - May 2022   -National conference November 2023     Associated mild emphysema present -started Spiriva   Normal Echo May 2021 and summer 2023  WEight loss  April 2022: 171#  June 2023: 162#  SEpt 2023: 158# - bmi 25.14 Aug 2022: 154#- bmi 24.13 December 2022-155 pounds  Aug 2024: 164#  Feb 2025: 169#  Aug 2025: 168#  Dec 2025L 173#      Esbriet /Pirfenidone  requires intensive drug monitoring due to high concerns for Adverse effects of , including  Drug Induced Liver Injury, significant GI side effects that include but not limited to Diarrhea, Nausea, Vomiting,  and other system side effects that include Fatigue, headaches, weight loss and other side effects such as skin rash. These will be monitored with  blood work such as LFT initially once a month for 6 months and then quarterly  #Screening for pulmonary hypertension'  -No evidence of pulmonary hypertension on echo March 2025  #Radiologic screening - Most recent CT scan of the chest at  [facility] chest February 27, 2024 with ILD and emphysema.  No change in ILD compared to April 2024.   HPI Reginald Davis 79 y.o. -Reginald Davis  is a 79 year old male with idiopathic pulmonary fibrosis who presents for follow-up regarding his pulmonary condition.  Presents with wife Aleck.  He did have a good Christmas.  Overall he feels stable with his dyspnea.  He takes pirfenidone .  He also takes a study drug by [employer] placebo versus actual IP.  He has no side effects from that.  He was advised by the study team in [place] that he cannot go on Nerandomilast and be on pirfenidone  and be on study drug.  He can only be on dual therapy options which is either Nerandomilast in study drug or pirfenidone  and study drug.  At this point in time he is opted to be on pirfenidone  and study drug because he has no side effects from pirfenidone  and Nerandomilast not necessarily superior to pirfenidone .  Even though he is more stable compared to the recent visit compared to last day is definitely more dyspneic [see symptom scores close bracket.  He tells me that he desaturates to 88 to 87% after walking up 1 flight of stairs from the kitchen living room area.  He also might have some transient desaturations on the treadmill.  This happens particularly when he loses focus on his breathing.  Main discussion point for this visit is whether right heart catheterization is indicated.  We felt that if the DLCO decline was going to be worse than the FVC decline then despite normal BNP and echocardiogram a year ago right heart catheterization would be indicated with intention to treat with standard of care treprostinil for WHO group 3 ILD.  He is a former patient of Dr. Ezra Shuck and currently follows Dr. Annabella Scarce.  He wants to continue his regular care with Dr. Annabella Scarce but he wants Dr. Ezra Shuck to right heart cath if it is indicated.   Review of his PFT shows stability in the [facility] but the  DLCO is got significant decline [see below] this significantly raises the question for pulmonary hypertension.  We went over right heart catheter it seems willing to go through this.  I have communicated with Dr. Ezra Shuck who has gotten back to me and will schedule this.    SYMPTOM SCALE - ILD 09/29/2020  11/10/2020 Now on esbriet  sinc 10/26/20 12/21/2020 esbriet  + 171# 08/22/2021 Esbriet  and Pliant study drug , 173# 11/28/2021 Esbriet , off plaint study, 2[place] night, 3L [place] exercise, 163# intential weight loss 03/02/2022 162# night o2 , ex o2 subjectively 06/07/2022 Uses exercise oxygen  08/27/2022 154#  - diarrhea sept/oc and took esbriet  holiday early oc-nov 2023 and needex xifaxan. BAck on esbrie at time of this visit 12/20/2022  04/22/2023 Generic pirfenidone  _ BMS study drug vial [facility] 07/23/2023 Generic esbreit + BMS study drug 11/04/2023 Genreic esbriet  + BMS study drug 05/07/2024 Generic esbriet  + BMS stidy drug (placebo v IP) 07/14/2024 Generic pirfenidone  plus BMS study drug [placebo versus IP] 08/25/2024 Generic pirfenidone  plus BMS study drug [placebo versus IP] ->+ BREZTRI  08/25/2024 Generic pirfenidone  plus BMS study drug [placebo versus IP] ->+ BREZTRI   O2 use ra  ra a t rest 2-3L with exercise pulsed 2L [place] at night, 3L pulsed during exerise and travels              Shortness of Breath 0 -> 5 scale with 5 being worst (score 6 If unable to do)                 At rest 0.5 0 0.5 1 1  1  0.5 1 1  0/5 1 1  1.5 1 1 1   Simple tasks - showers, clothes change, eating, shaving 1 00 0 0 1 1 1 1 1 0  1 1 1.5 1.5 2 1   Household (dishes, doing bed, laundry) 1 0 0.5 1 2  1.5 1 2 2 1 2 2  1.5 3 2 2   Shopping 1 0 0.5 1 2  1.5 1 1.5 1 1 1 1  1.5 2.5 2 2   Walking level at own pace 1.5 1 1  0 2 2 2 2 2 1 1 2 2  3 3 2.5  Walking up Stairs *2.25 2 2 2 3 3 2  2.5 3 1.5 2 2 3 4 4  4.5  Total (30-36) Dyspnea Score 7.25 3 6 5 11 10  7.5 10 10 5 8 9 11 19 14 13   How bad is your cough? Improved with ppi 1 1 ` 1 1  0.5 1 1 1 1 1 2  1.5 1 3   How bad is your fatigue 0 0 0.5 ` 1 1 1 1 1  0 1 1 0 1 2 1   How bad is nausea 0 0 0 0 0 0 0 0 0 0  0 0 0 0 0 0  How bad is vomiting?  0 00 0 0 0 0 0 0 1 0  0 0 0 0 0 0  How bad is diarrhea? 0.5 coffee 0 0.25 1 1  0 2.5 1 0 0 0 0 0 0 0 0   How bad is anxiety? 0.5 0 0.5 1 0 0 0.5 0 0 0 00 0 0 0 0 0   How bad is depression 0 0 0 0 0 0 0 0  0 0 1 0 0 0 0         SIT STAND TEST - goal 15 times   05/07/2024 624 meters - needed 5L  (first time neeed o2 - 02/25/24) .07/14/2024 6-minute walk test done 05/18/2024 at [facility] chest on room air forted and 444 m.  Started with pulse ox of 95% and completed with a pulse ox of 88% baseline heart rate 87 end of test heart rate 70 08/25/2024. 09/24/2024   O2 used ra ra ra ra  PRobe - finter or forehead finger Finger finger finger  Number sit and stand completed - goal 15 15 15  times 15 15  Time taken to complete 40 sec 32.8 seconds 38 sec 35.3 seconds  Resting Pulse Ox/HR/Dyspnea  95% and 64/min and dyspnea of 1/10  96% with heart rate of 74 and dyspnea score of 4 96% and HR 84 and score 3 99% with heart rate 67 score 3  Peak measures 96 % and 87/min and dyspnea of 3/10 96% with a heart rate of 94 and dyspnea score of 7 95% and HR 107 and score 7 97% with heart rate 94 and score 6  Final Pulse Ox/HR 92% and 69/min and dyspnea of 3/10 93% with a heart rate of 77 and dyspnea score of 5.5 92% and HR 83 and score 4 94% with heart rate 70 and score 4  Desaturated </= 88% no no    Desaturated <= 3% points yes no    Got Tachycardic >/= 90/min No but almsot yes    Miscellaneous comments x Worsening dyspnea score for similar performance and similar levels of oxygen  drop Same but 2 seconds lower compared to recent time but overall range bound Very similar        PFT     Latest Ref Rng & Units  09/08/2024   10:41 AM 12/20/2022   12:19 PM 08/22/2022    8:53 AM 03/02/2022    8:49 AM 11/25/2020    8:52 AM 07/12/2020   11:53 AM  PFT Results   FVC-Pre L 3.76  3.73  3.94  3.89  3.43  3.36   FVC-Predicted Pre % 108  105  111  108  94  88   FVC-Post L      3.41   FVC-Predicted Post %      90   Pre FEV1/FVC % % 69  73  70  71  74  72   Post FEV1/FCV % %      74   FEV1-Pre L 2.59  2.73  2.76  2.75  2.53  2.41   FEV1-Predicted Pre % 105  107  109  106  97  88   FEV1-Post L      2.51   DLCO uncorrected ml/min/mmHg 11.54  15.84  16.25  14.76  13.56  16.88   DLCO UNC% % 53  71  73  66  60  73   DLCO corrected ml/min/mmHg  16.03  16.25  14.84  13.45  16.88   DLCO COR %Predicted %  72  73  67  60  73   DLVA Predicted % 60  74  71  66  66  80   TLC L      5.39   TLC % Predicted %      83   RV % Predicted %      65        LAB RESULTS last 96 hours No results found.       has a past medical history of Burn, Coronary artery disease, Diverticul disease small and large intestine, no perforati or abscess, Exertional dyspnea (06/24/2023), GERD (gastroesophageal reflux disease), Hernia, History of nuclear stress test, Hyperlipidemia, Low back pain, Oxygen  toxicity, Plantar fasciitis, Retinal tear, Right rotator cuff tear, and Shingles.   reports that he quit smoking about 29 years ago. His smoking use included cigarettes. He started smoking about 49 years ago. He has a 40 pack-year smoking history. He has been exposed to tobacco smoke. He has never used smokeless tobacco.  Past Surgical History:  Procedure Laterality Date   EYE SURGERY     PACEMAKER IMPLANT N/A 01/04/2021   Procedure: PACEMAKER IMPLANT;  Surgeon: Inocencio Soyla Lunger, MD;  Location: [facility] INVASIVE CV LAB;  Service: Cardiovascular;  Laterality: N/A;   REFRACTIVE SURGERY     retina repair and retina tear   SHOULDER ARTHROSCOPY     SHOULDER SURGERY     TONSILLECTOMY      Allergies[1]  Immunization History  Administered Date(s) Administered   Fluad Quad(high Dose 65+) 06/07/2022   Fluzone Influenza virus vaccine,trivalent (IIV3), split virus 06/01/2010, 06/07/2011,  05/27/2012   INFLUENZA, HIGH DOSE SEASONAL PF 06/15/2015, 06/05/2016, 06/08/2017, 06/07/2018   Influenza Split 09/22/2009, 07/28/2013   Influenza, Quadrivalent, Recombinant, Inj, Pf 06/07/2018, 06/12/2019, 06/22/2020, 06/01/2021, 06/06/2023   Influenza,inj,Quad PF,6+ Mos 06/18/2014   Moderna Covid-19 Vaccine  Bivalent Booster 41yrs & up 06/14/2021, 01/05/2022   PFIZER(Purple Top)SARS-COV-2 Vaccination 10/05/2019, 10/27/2019, 06/03/2020   PNEUMOCOCCAL CONJUGATE-20 02/26/2021   Pfizer(Comirnaty )Fall Seasonal Vaccine 12 years and older 01/04/2023, 05/30/2023, 12/11/2023, 05/28/2024   Pneumococcal Conjugate-13 08/12/2013, 06/13/2014   Pneumococcal Polysaccharide-23 11/04/2000, 09/22/2009, 08/16/2010   Respiratory Syncytial Virus Vaccine ,Recomb Aduvanted(Arexvy ) 05/21/2022   Td 09/10/1998, 09/22/2009, 12/23/2019   Tdap 10/02/2010   Typhoid Inactivated 09/09/2015   Zoster  Recombinant(Shingrix) 01/13/2017, 04/26/2017   Zoster, Live 09/16/2007, 09/22/2009    Family History  Problem Relation Age of Onset   Hyperlipidemia Mother    Diabetes Father    Heart disease Father    Coronary artery disease Brother     Current Medications[2]      Objective:   Vitals:   09/24/24 1459  BP: 136/82  Pulse: 65  Temp: 97.7 F (36.5 C)  TempSrc: Oral  SpO2: 98%  Weight: 170 lb 9.6 oz (77.4 kg)  Height: 5' 6 (1.676 m)    Estimated body mass index is 27.54 kg/m as calculated from the following:   Height as of this encounter: 5' 6 (1.676 m).   Weight as of this encounter: 170 lb 9.6 oz (77.4 kg).  @WEIGHTCHANGE @  [employer]   09/24/24 1459  Weight: 170 lb 9.6 oz (77.4 kg)     Physical Exam   General: No distress. Looks well O2 at rest: no Cane present: no Sitting in wheel chair: no Frail: no Obese: no Neuro: Alert and Oriented x 3. GCS 15. Speech normal Psych: Pleasant Resp:  Barrel Chest - no.  Wheeze - no, Crackles - yes, No overt respiratory distress CVS: Normal heart  sounds. Murmurs - no Ext: Stigmata of Connective Tissue Disease - no HEENT: Normal upper airway. PEERL +. No post nasal drip. WEARS MASK. LAbored talking through mask        Assessment/     Assessment & Plan ILD (interstitial lung disease) (HCC)  IPF (idiopathic pulmonary fibrosis) (HCC)  Encounter for therapeutic drug monitoring  DOE (dyspnea on exertion)    PLAN Patient Instructions     ICD-10-CM   1. ILD (interstitial lung disease) (HCC)  J84.9     2. IPF (idiopathic pulmonary fibrosis) (HCC)  J84.112     3. Encounter for therapeutic drug monitoring  Z51.81     4. DOE (dyspnea on exertion)  R06.09           #IPF: This is slowly progressive over time (Based on PFT)   -  You are on max therapy - On generic pirfenidone  + BMS study drug  v placebo;   Recent recent safety labs at [facility] chest are normal  Plan  - Continue both pirfenidone   - Continue study drug of IPF - Discussed Nerandomilast and you opted against this as add-on (opted to stay on study as your second drug]  #Worsening shortness of breath   - echo march 2025 No evoidence of pulmonary hypertension but in PFT 09/08/24 -> DLCO decline is greater than FVC decline   Plan = Rule out pulmonary hypertension; sent a message to Dr. Ezra Shuck for heart cath right heart  - -Continue portable oxygen  with exertion - Continue nighttime oxygen   - continue weight loss - continue breztri   Associated emphysema  -Glad some better with BREZTRI   Plan - continue Breztri  2 puff 2 times daily   Follow-up - At 6 week standard of care visit with Dr. Geronimo 30 minutes-  -Symptom questionnaire and walk test at follow-up; discussed right heart cath results  - If I am not available then at least see the nurse practitioner    FOLLOWUP    Return in about 6 weeks (around 11/05/2024) for 30 min visit, with any of the APPS, with Dr Geronimo, Face to Face Visit.  ( Level 05 visit E&M 2024: Estb >= 40 min   in  visit type: on-site physical face to visit  in total  care time and counseling or/and coordination of care by this undersigned MD - Dr Dorethia Cave. This includes one or more of the following on this same day 09/24/2024: pre-charting, chart review, note writing, documentation discussion of test results, diagnostic or treatment recommendations, prognosis, risks and benefits of management options, instructions, education, compliance or risk-factor reduction. It excludes time spent by the CMA or office staff in the care of the patient. Actual time 45 min)   SIGNATURE    Dr. Dorethia Cave, M.D., F.C.C.P,  Pulmonary and Critical Care Medicine Staff Physician, [facility] Director - Interstitial Lung Disease  Program  Pulmonary Fibrosis [facility] at [facility] [place], [place], [address]  Pager: [phone], If no answer or between  15:00h - 7:00h: call [phone] Telephone: [phone]  5:21 PM 09/24/2024    [1]  Allergies Allergen Reactions   Ibuprofen Other (See Comments)    Other Reaction(s): Elevated LFT   Loratadine Other (See Comments)    Other Reaction(s): Urinary Problems  [2]  Current Outpatient Medications:    Alirocumab  (PRALUENT ) 150 MG/ML SOAJ, Inject 150 mg as directed every 30 (thirty) days. , Disp: , Rfl:    allopurinol  (ZYLOPRIM ) 100 MG tablet, Take 1 tablet by mouth 2 (two) times daily., Disp: , Rfl:    Alpha-Lipoic Acid 200 MG CAPS, Take 1 capsule by mouth in the morning and at bedtime., Disp: , Rfl:    aspirin  81 MG tablet, Take 81 mg by mouth daily., Disp: , Rfl:    atorvastatin  (LIPITOR) 20 MG tablet, Take 10 mg by mouth at bedtime., Disp: , Rfl:    budesonide-glycopyrrolate-formoterol (BREZTRI  AEROSPHERE) 160-9-4.8 MCG/ACT AERO inhaler, Inhale 2 puffs into the lungs 2 (two) times daily., Disp: , Rfl:    budesonide-glycopyrrolate-formoterol (BREZTRI  AEROSPHERE) 160-9-4.8 MCG/ACT AERO inhaler, Inhale 2 puffs into  the lungs in the morning and at bedtime. Rinse mouth after each use., Disp: 10.7 g, Rfl: 11   budesonide-glycopyrrolate-formoterol (BREZTRI  AEROSPHERE) 160-9-4.8 MCG/ACT AERO inhaler, Inhale 2 puffs into the lungs in the morning and at bedtime., Disp: , Rfl:    Cholecalciferol (VITAMIN D3) 1.25 MG (50000 UT) CAPS, Take 1 capsule (1.25 mg total) by mouth every 30 (thirty) days., Disp: 12 capsule, Rfl: 11   Coenzyme Q10 300 MG CAPS, Take 1 capsule by mouth every morning., Disp: , Rfl: 0   colchicine  0.6 MG tablet, Take 1 tablet by mouth 2 (two) times daily as needed (gout flare)., Disp: , Rfl:    dorzolamide -timolol  (COSOPT ) 22.3-6.8 MG/ML ophthalmic solution, Place 1 drop into the right eye 2 (two) times daily., Disp: , Rfl:    fluorometholone (FML) 0.1 % ophthalmic suspension, Place 1 drop into the right eye daily., Disp: , Rfl:    Guaifenesin  1200 MG TB12, Take 1,200 mg by mouth 2 (two) times daily., Disp: , Rfl:    latanoprost (XALATAN) 0.005 % ophthalmic solution, Place 1 drop into the right eye at bedtime., Disp: , Rfl:    Multiple Vitamins-Minerals (CENTRUM SILVER PO), Take 1 tablet by mouth every evening., Disp: , Rfl:    OXYGEN , Inhale 2 L into the lungs., Disp: , Rfl:    Pirfenidone  801 MG TABS, Take 1 tablet (801 mg total) by mouth in the morning, at noon, and at bedtime., Disp: 270 tablet, Rfl: 1   Polyethyl Glycol-Propyl Glycol (SYSTANE) 0.4-0.3 % SOLN, Apply 1 drop to eye daily as needed (dry eyes)., Disp: , Rfl:  triamcinolone cream (KENALOG) 0.1 %, Apply topically in the morning and at bedtime., Disp: , Rfl:    valACYclovir (VALTREX) 1000 MG tablet, Take 1 tablet by mouth daily as needed (fever blisters)., Disp: , Rfl:    valsartan  (DIOVAN ) 320 MG tablet, TAKE 1 TABLET (320 MG TOTAL) BY MOUTH EVERY EVENING., Disp: 90 tablet, Rfl: 3   vitamin C (ASCORBIC ACID ) 500 MG tablet, Take 500 mg by mouth every evening., Disp: , Rfl:    albuterol  (VENTOLIN  HFA) 108 (90 Base) MCG/ACT inhaler,  Inhale 1-2 puffs into the lungs every 6 (six) hours as needed for wheezing or shortness of breath. (Patient not taking: Reported on 09/24/2024), Disp: 18 g, Rfl: 1   Loperamide-Simethicone 2-125 MG TABS, Take 1 tablet by mouth as needed. Up to 4 times a day. (Patient not taking: Reported on 09/24/2024), Disp: , Rfl:    XIFAXAN 550 MG TABS tablet, Take 550 mg by mouth 3 (three) times daily. PRN (Patient not taking: Reported on 09/24/2024), Disp: , Rfl:   "

## 2024-09-24 NOTE — Patient Instructions (Addendum)
"    ICD-10-CM   1. ILD (interstitial lung disease) (HCC)  J84.9     2. IPF (idiopathic pulmonary fibrosis) (HCC)  J84.112     3. Encounter for therapeutic drug monitoring  Z51.81     4. DOE (dyspnea on exertion)  R06.09           #IPF: This is slowly progressive over time (Based on PFT)   -  You are on max therapy - On generic pirfenidone  + BMS study drug  v placebo;   Recent recent safety labs at [facility] chest are normal  Plan  - Continue both pirfenidone   - Continue study drug of IPF - Discussed Nerandomilast and you opted against this as add-on (opted to stay on study as your second drug]  #Worsening shortness of breath   - echo march 2025 No evoidence of pulmonary hypertension but in PFT 09/08/24 -> DLCO decline is greater than FVC decline   Plan = Rule out pulmonary hypertension; sent a message to Dr. Ezra Shuck for heart cath right heart  - -Continue portable oxygen  with exertion - Continue nighttime oxygen   - continue weight loss - continue breztri   Associated emphysema  -Glad some better with BREZTRI   Plan - continue Breztri  2 puff 2 times daily   Follow-up - At 6 week standard of care visit with Dr. Geronimo 30 minutes-  -Symptom questionnaire and walk test at follow-up; discussed right heart cath results  - If I am not available then at least see the nurse practitioner "

## 2024-09-24 NOTE — Telephone Encounter (Signed)
 Reginald Davis and Reginald Davis used to see you before.  He he  wants  Reginald Davis to do the right heart cath.  I will leave right heart cath is indicated to rule out pH ILD.  This because the slope of decline in DLCO is far worse than the slope of decline in the FVC over time.  Some of the recent research in Phinder study is suggesting this is predictive  He is currently on a research protocol for IPF at [facility]  Therefore I recommend standard of care right heart cath with intent to treat with treprostinil   Thanks    SIGNATURE    Dr. Dorethia Cave, M.D., F.C.C.P,  Pulmonary and Critical Care Medicine Staff Physician, [facility] Center Director - Interstitial Lung Disease  Program  Pulmonary Fibrosis [facility] at [facility] [place], [place], [address]   Pager: [phone], If no answer  -> Check AMION or Try [phone] Telephone (clinical office): [phone] Telephone (research): [phone]  3:42 PM 09/24/2024

## 2024-09-24 NOTE — Telephone Encounter (Signed)
 Signed today and given to him

## 2024-09-24 NOTE — Telephone Encounter (Signed)
 Sure, I am happy to set up a RHC.  Emer/Eileen, can one of you please set up RHC for me for pulmonary hypertension? Thanks.

## 2024-09-25 ENCOUNTER — Encounter: Payer: Self-pay | Admitting: Internal Medicine

## 2024-09-28 ENCOUNTER — Other Ambulatory Visit: Payer: Self-pay

## 2024-09-29 ENCOUNTER — Telehealth: Payer: Self-pay | Admitting: Internal Medicine

## 2024-09-29 DIAGNOSIS — R042 Hemoptysis: Secondary | ICD-10-CM

## 2024-09-29 DIAGNOSIS — J84112 Idiopathic pulmonary fibrosis: Secondary | ICD-10-CM

## 2024-09-29 DIAGNOSIS — R0609 Other forms of dyspnea: Secondary | ICD-10-CM

## 2024-09-29 MED ORDER — VALSARTAN 320 MG PO TABS: 320.0000 mg | ORAL_TABLET | Freq: Every evening | ORAL | 3 refills | Status: AC

## 2024-09-29 NOTE — Telephone Encounter (Signed)
" °  Reginald Davis = just texted me saying some small episodes of hemoptysis that are noew - he is not on blood thinner  Plan  - cbc, bmet, lft,D-dIMER, bnp - stat - cxr - see if APP can see him 09/30/24   He will come at 8:30am for labs but aPP should try to see him    Latest Reference Range & Units 11/11/20 11:23 07/14/24 13:52  D-Dimer, Quant <0.50 mcg/mL FEU <0.19 0.47    Latest Reference Range & Units 03/02/22 10:11 11/04/23 15:28 07/14/24 13:52  Pro B Natriuretic peptide (BNP) 0.0 - 100.0 pg/mL 28.0 22.0 25.0   "

## 2024-09-30 ENCOUNTER — Ambulatory Visit

## 2024-09-30 ENCOUNTER — Other Ambulatory Visit

## 2024-09-30 ENCOUNTER — Other Ambulatory Visit: Payer: Self-pay

## 2024-09-30 ENCOUNTER — Telehealth: Payer: Self-pay

## 2024-09-30 ENCOUNTER — Ambulatory Visit (INDEPENDENT_AMBULATORY_CARE_PROVIDER_SITE_OTHER)

## 2024-09-30 VITALS — BP 119/74 | HR 60 | Temp 97.7°F | Ht 66.0 in | Wt 169.0 lb

## 2024-09-30 DIAGNOSIS — R042 Hemoptysis: Secondary | ICD-10-CM | POA: Diagnosis not present

## 2024-09-30 DIAGNOSIS — R0609 Other forms of dyspnea: Secondary | ICD-10-CM

## 2024-09-30 DIAGNOSIS — J84112 Idiopathic pulmonary fibrosis: Secondary | ICD-10-CM

## 2024-09-30 LAB — COMPREHENSIVE METABOLIC PANEL WITH GFR
ALT: 31 U/L (ref 3–53)
AST: 27 U/L (ref 5–37)
Albumin: 4.3 g/dL (ref 3.5–5.2)
Alkaline Phosphatase: 41 U/L (ref 39–117)
BUN: 18 mg/dL (ref 6–23)
CO2: 28 meq/L (ref 19–32)
Calcium: 9.6 mg/dL (ref 8.4–10.5)
Chloride: 102 meq/L (ref 96–112)
Creatinine, Ser: 0.91 mg/dL (ref 0.40–1.50)
GFR: 80.83 mL/min
Glucose, Bld: 84 mg/dL (ref 70–99)
Potassium: 4.8 meq/L (ref 3.5–5.1)
Sodium: 137 meq/L (ref 135–145)
Total Bilirubin: 0.4 mg/dL (ref 0.2–1.2)
Total Protein: 7.2 g/dL (ref 6.0–8.3)

## 2024-09-30 LAB — CBC WITH DIFFERENTIAL/PLATELET
Basophils Absolute: 0.1 K/uL (ref 0.0–0.1)
Basophils Relative: 0.6 % (ref 0.0–3.0)
Eosinophils Absolute: 0.2 K/uL (ref 0.0–0.7)
Eosinophils Relative: 1.8 % (ref 0.0–5.0)
HCT: 43.5 % (ref 39.0–52.0)
Hemoglobin: 14.5 g/dL (ref 13.0–17.0)
Lymphocytes Relative: 16.4 % (ref 12.0–46.0)
Lymphs Abs: 1.7 K/uL (ref 0.7–4.0)
MCHC: 33.3 g/dL (ref 30.0–36.0)
MCV: 101.3 fl — ABNORMAL HIGH (ref 78.0–100.0)
Monocytes Absolute: 1.2 K/uL — ABNORMAL HIGH (ref 0.1–1.0)
Monocytes Relative: 11.1 % (ref 3.0–12.0)
Neutro Abs: 7.3 K/uL (ref 1.4–7.7)
Neutrophils Relative %: 70.1 % (ref 43.0–77.0)
Platelets: 319 K/uL (ref 150.0–400.0)
RBC: 4.29 Mil/uL (ref 4.22–5.81)
RDW: 13.6 % (ref 11.5–15.5)
WBC: 10.5 K/uL (ref 4.0–10.5)

## 2024-09-30 LAB — D-DIMER, QUANTITATIVE: D-Dimer, Quant: 0.33 ug{FEU}/mL

## 2024-09-30 LAB — BRAIN NATRIURETIC PEPTIDE: Pro B Natriuretic peptide (BNP): 26 pg/mL (ref 1.0–100.0)

## 2024-09-30 NOTE — Telephone Encounter (Signed)
 Noted, having labs drawn per Dr. Geronimo and seeing Wells NP.  Nothing further needed.

## 2024-09-30 NOTE — Addendum Note (Signed)
 Addended by: NORVELL FRIDAY on: 09/30/2024 02:46 PM   Modules accepted: Orders
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
## 2024-09-30 NOTE — Patient Instructions (Addendum)
 It was good to see you today.  Your chest x-ray shows no acute cardiopulmonary process and similar appearance of faint right upper and left lung base interstitial densities, likely scarring. We have already collected a CBC (blood counts), we will see if this is contributing to your bleeding.  I have ordered a CT angiogram of your chest to see if there are any causes of bleeding.  I will call you with these results.  Please use over the counter Delsum (Dextromethorphan) for cough suppression.  If you cough up more than 50 mLs of blood, go to the ER or call 911.  Follow up with Dr. Geronimo in March as scheduled.

## 2024-09-30 NOTE — Telephone Encounter (Signed)
 Patient arrived fro blood work and is schedule with APP for today.

## 2024-09-30 NOTE — Telephone Encounter (Addendum)
 Patient called back to discuss lab results, confirmed identity with two patient identifiers. Discussed results of blood counts, metabolic panel, liver function, BNP, and D-dimer. All blood work appears to be within normal limits, he verbalized understanding of this. He is scheduled for  CT angiogram of chest tomorrow. After consulting with Dr. Geronimo, he feels that a CT angiogram is unnecessary due to the lack of shortness of breath, normal lab work and normal chest x-ray. Patient can keep appointment for tomorrow in case symptoms worsen, otherwise follow up with Dr. Geronimo 11/09/2024. Call sooner if blood in sputum doesn't decrease or if you develop fever, increased shortness of breath or chest pain. Patient verbalized understanding of plan.   Wells CHRISTELLA [place], FNP

## 2024-09-30 NOTE — Progress Notes (Signed)
 A user error has taken place: encounter opened in error, closed for administrative reasons.   Reginald CHRISTELLA [place], FNP

## 2024-10-01 ENCOUNTER — Encounter (HOSPITAL_COMMUNITY): Payer: Self-pay

## 2024-10-01 ENCOUNTER — Ambulatory Visit (HOSPITAL_COMMUNITY)

## 2024-10-05 ENCOUNTER — Other Ambulatory Visit: Payer: Self-pay | Admitting: Internal Medicine

## 2024-10-05 DIAGNOSIS — J84112 Idiopathic pulmonary fibrosis: Secondary | ICD-10-CM

## 2024-10-06 ENCOUNTER — Other Ambulatory Visit (HOSPITAL_COMMUNITY): Payer: Self-pay

## 2024-10-06 DIAGNOSIS — J8410 Pulmonary fibrosis, unspecified: Secondary | ICD-10-CM

## 2024-10-06 DIAGNOSIS — J849 Interstitial pulmonary disease, unspecified: Secondary | ICD-10-CM

## 2024-10-06 NOTE — Telephone Encounter (Signed)
 Patient scheduled and aware. Instructions sent via my chart. PA sent to Pulmonology

## 2024-10-08 ENCOUNTER — Ambulatory Visit: Admitting: Internal Medicine

## 2024-10-08 NOTE — Telephone Encounter (Signed)
 Refill sent for ESBRIET  to [facility] (pulmonary fibrosis team): [phone]  Dose: 801mg  TID  Last OV: 09/30/24 Provider: Dr. Geronimo Pertinent labs: LFTs wnl 09/30/24  Next OV: 11/09/24  Aleck Puls, PharmD, BCPS Clinical Pharmacist  [facility]

## 2024-10-12 ENCOUNTER — Ambulatory Visit: Payer: Medicare Other

## 2024-10-12 ENCOUNTER — Telehealth (HOSPITAL_COMMUNITY): Payer: Self-pay

## 2024-10-12 NOTE — Telephone Encounter (Signed)
 Attempted to reach patient regarding procedure scheduled for tomorrow

## 2024-10-13 ENCOUNTER — Ambulatory Visit (HOSPITAL_COMMUNITY)
Admission: RE | Admit: 2024-10-13 | Discharge: 2024-10-13 | Disposition: A | Discharge status: Home / Self Care | Attending: Cardiology | Admitting: Cardiology

## 2024-10-13 ENCOUNTER — Encounter (HOSPITAL_COMMUNITY)
Admission: RE | Disposition: A | Discharge status: Home / Self Care | Payer: Self-pay | Source: Home / Self Care | Attending: Cardiology

## 2024-10-13 ENCOUNTER — Encounter (HOSPITAL_COMMUNITY): Payer: Self-pay | Admitting: Cardiology

## 2024-10-13 ENCOUNTER — Other Ambulatory Visit: Payer: Self-pay

## 2024-10-13 DIAGNOSIS — J439 Emphysema, unspecified: Secondary | ICD-10-CM | POA: Insufficient documentation

## 2024-10-13 DIAGNOSIS — J8410 Pulmonary fibrosis, unspecified: Secondary | ICD-10-CM

## 2024-10-13 DIAGNOSIS — J849 Interstitial pulmonary disease, unspecified: Secondary | ICD-10-CM

## 2024-10-13 DIAGNOSIS — I272 Pulmonary hypertension, unspecified: Secondary | ICD-10-CM | POA: Insufficient documentation

## 2024-10-13 DIAGNOSIS — Z87891 Personal history of nicotine dependence: Secondary | ICD-10-CM | POA: Insufficient documentation

## 2024-10-13 DIAGNOSIS — J84112 Idiopathic pulmonary fibrosis: Secondary | ICD-10-CM | POA: Insufficient documentation

## 2024-10-13 DIAGNOSIS — Z79899 Other long term (current) drug therapy: Secondary | ICD-10-CM | POA: Insufficient documentation

## 2024-10-13 LAB — POCT I-STAT EG7
Acid-base deficit: 1 mmol/L (ref 0.0–2.0)
Bicarbonate: 24.6 mmol/L (ref 20.0–28.0)
Calcium, Ion: 1.23 mmol/L (ref 1.15–1.40)
HCT: 39 % (ref 39.0–52.0)
Hemoglobin: 13.3 g/dL (ref 13.0–17.0)
O2 Saturation: 72 %
Potassium: 4.3 mmol/L (ref 3.5–5.1)
Sodium: 141 mmol/L (ref 135–145)
TCO2: 26 mmol/L (ref 22–32)
pCO2, Ven: 42.4 mmHg — ABNORMAL LOW (ref 44–60)
pH, Ven: 7.371 (ref 7.25–7.43)
pO2, Ven: 39 mmHg (ref 32–45)

## 2024-10-13 MED ORDER — SODIUM CHLORIDE 0.9% FLUSH: 3.0000 mL | Freq: Two times a day (BID) | INTRAVENOUS | Status: DC

## 2024-10-13 MED ORDER — SODIUM CHLORIDE 0.9 % IV SOLN: 250.0000 mL | INTRAVENOUS | Status: DC | PRN

## 2024-10-13 MED ORDER — FREE WATER: 250.0000 mL | Freq: Once | Status: DC

## 2024-10-13 MED ORDER — LABETALOL HCL 5 MG/ML IV SOLN: 10.0000 mg | INTRAVENOUS | Status: DC | PRN

## 2024-10-13 MED ORDER — HYDRALAZINE HCL 20 MG/ML IJ SOLN: 10.0000 mg | INTRAMUSCULAR | Status: DC | PRN

## 2024-10-13 MED ORDER — ONDANSETRON HCL 4 MG/2ML IJ SOLN: 4.0000 mg | Freq: Four times a day (QID) | INTRAMUSCULAR | Status: DC | PRN

## 2024-10-13 MED ORDER — HEPARIN (PORCINE) IN NACL 1000-0.9 UT/500ML-% IV SOLN
INTRAVENOUS | Status: DC | PRN
  Administered 2024-10-13 (×2): 500 mL

## 2024-10-13 MED ORDER — ACETAMINOPHEN 325 MG PO TABS: 650.0000 mg | ORAL_TABLET | ORAL | Status: DC | PRN

## 2024-10-13 MED ORDER — LIDOCAINE HCL (PF) 1 % IJ SOLN
INTRAMUSCULAR | Status: DC | PRN
  Administered 2024-10-13: 5 mL via INTRADERMAL

## 2024-10-13 MED ORDER — SODIUM CHLORIDE 0.9% FLUSH: 3.0000 mL | INTRAVENOUS | Status: DC | PRN

## 2024-10-13 MED ORDER — LIDOCAINE HCL (PF) 1 % IJ SOLN
INTRAMUSCULAR | Status: AC
  Filled 2024-10-13: qty 30

## 2024-10-13 NOTE — Discharge Instructions (Signed)

## 2024-10-13 NOTE — Interval H&P Note (Signed)
 History and Physical Interval Note:  10/13/2024 7:36 AM  Reginald Davis  has presented today for surgery, with the diagnosis of hp.  The various methods of treatment have been discussed with the patient and family. After consideration of risks, benefits and other options for treatment, the patient has consented to  Procedures: RIGHT HEART CATH (N/A) as a surgical intervention.  The patient's history has been reviewed, patient examined, no change in status, stable for surgery.  I have reviewed the patient's chart and labs.  Questions were answered to the patient's satisfaction.     Shanera Meske [employer]

## 2024-10-14 LAB — POCT I-STAT EG7
Acid-base deficit: 1 mmol/L (ref 0.0–2.0)
Bicarbonate: 23.8 mmol/L (ref 20.0–28.0)
Calcium, Ion: 1.2 mmol/L (ref 1.15–1.40)
HCT: 39 % (ref 39.0–52.0)
Hemoglobin: 13.3 g/dL (ref 13.0–17.0)
O2 Saturation: 67 %
Potassium: 4.3 mmol/L (ref 3.5–5.1)
Sodium: 141 mmol/L (ref 135–145)
TCO2: 25 mmol/L (ref 22–32)
pCO2, Ven: 40.9 mmHg — ABNORMAL LOW (ref 44–60)
pH, Ven: 7.372 (ref 7.25–7.43)
pO2, Ven: 36 mmHg (ref 32–45)

## 2024-10-27 ENCOUNTER — Ambulatory Visit: Admitting: Internal Medicine

## 2024-11-03 ENCOUNTER — Encounter

## 2024-11-09 ENCOUNTER — Ambulatory Visit: Admitting: Internal Medicine

## 2024-11-18 ENCOUNTER — Other Ambulatory Visit (HOSPITAL_BASED_OUTPATIENT_CLINIC_OR_DEPARTMENT_OTHER)

## 2025-01-11 ENCOUNTER — Ambulatory Visit: Payer: Medicare Other

## 2025-02-02 ENCOUNTER — Encounter

## 2025-04-12 ENCOUNTER — Ambulatory Visit: Payer: Medicare Other

## 2025-07-12 ENCOUNTER — Ambulatory Visit: Payer: Medicare Other
